# Patient Record
Sex: Female | Born: 1940
Health system: Southern US, Community
[De-identification: ages and names within clinical notes are randomized; demographics above are authoritative.]

## PROBLEM LIST (undated history)

## (undated) DIAGNOSIS — E041 Nontoxic single thyroid nodule: Secondary | ICD-10-CM

## (undated) DIAGNOSIS — K5792 Diverticulitis of intestine, part unspecified, without perforation or abscess without bleeding: Secondary | ICD-10-CM

## (undated) DIAGNOSIS — J45909 Unspecified asthma, uncomplicated: Secondary | ICD-10-CM

## (undated) DIAGNOSIS — E119 Type 2 diabetes mellitus without complications: Secondary | ICD-10-CM

## (undated) DIAGNOSIS — I10 Essential (primary) hypertension: Secondary | ICD-10-CM

## (undated) DIAGNOSIS — F32A Depression, unspecified: Secondary | ICD-10-CM

## (undated) DIAGNOSIS — J449 Chronic obstructive pulmonary disease, unspecified: Secondary | ICD-10-CM

## (undated) DIAGNOSIS — C801 Malignant (primary) neoplasm, unspecified: Secondary | ICD-10-CM

## (undated) DIAGNOSIS — F329 Major depressive disorder, single episode, unspecified: Secondary | ICD-10-CM

## (undated) DIAGNOSIS — K219 Gastro-esophageal reflux disease without esophagitis: Secondary | ICD-10-CM

## (undated) DIAGNOSIS — M1990 Unspecified osteoarthritis, unspecified site: Secondary | ICD-10-CM

## (undated) DIAGNOSIS — E785 Hyperlipidemia, unspecified: Secondary | ICD-10-CM

## (undated) HISTORY — DX: Essential (primary) hypertension: I10

## (undated) HISTORY — DX: Diverticulitis of intestine, part unspecified, without perforation or abscess without bleeding: K57.92

## (undated) HISTORY — DX: Chronic obstructive pulmonary disease, unspecified: J44.9

## (undated) HISTORY — PX: CATARACT EXTRACTION: SUR2

## (undated) HISTORY — PX: CATARACT EXTRACTION W/ INTRAOCULAR LENS IMPLANT: SHX1309

## (undated) HISTORY — DX: Type 2 diabetes mellitus without complications: E11.9

## (undated) HISTORY — PX: EYE SURGERY: SHX253

## (undated) HISTORY — PX: BIOPSY THYROID: PRO38

## (undated) HISTORY — DX: Unspecified asthma, uncomplicated: J45.909

## (undated) HISTORY — DX: Hyperlipidemia, unspecified: E78.5

## (undated) HISTORY — PX: TUBAL LIGATION: SHX77

---

## 1999-11-12 ENCOUNTER — Other Ambulatory Visit: Admission: RE | Admit: 1999-11-12 | Discharge: 1999-11-12 | Payer: Self-pay | Admitting: Gynecology

## 2000-06-03 ENCOUNTER — Other Ambulatory Visit: Admission: RE | Admit: 2000-06-03 | Discharge: 2000-06-03 | Payer: Self-pay | Admitting: Gynecology

## 2006-08-29 ENCOUNTER — Ambulatory Visit (HOSPITAL_COMMUNITY): Admission: RE | Admit: 2006-08-29 | Discharge: 2006-08-29 | Payer: Self-pay | Admitting: Internal Medicine

## 2006-12-30 ENCOUNTER — Encounter: Admission: RE | Admit: 2006-12-30 | Discharge: 2006-12-30 | Payer: Self-pay | Admitting: Internal Medicine

## 2007-12-18 ENCOUNTER — Encounter: Admission: RE | Admit: 2007-12-18 | Discharge: 2007-12-18 | Payer: Self-pay | Admitting: Internal Medicine

## 2008-03-02 ENCOUNTER — Emergency Department (HOSPITAL_COMMUNITY): Admission: EM | Admit: 2008-03-02 | Discharge: 2008-03-03 | Payer: Self-pay | Admitting: Emergency Medicine

## 2009-05-19 ENCOUNTER — Encounter: Admission: RE | Admit: 2009-05-19 | Discharge: 2009-05-19 | Payer: Self-pay | Admitting: Internal Medicine

## 2009-05-27 ENCOUNTER — Encounter: Admission: RE | Admit: 2009-05-27 | Discharge: 2009-05-27 | Payer: Self-pay | Admitting: Internal Medicine

## 2009-10-04 ENCOUNTER — Ambulatory Visit (HOSPITAL_COMMUNITY): Admission: RE | Admit: 2009-10-04 | Discharge: 2009-10-04 | Payer: Self-pay | Admitting: Obstetrics and Gynecology

## 2009-11-21 ENCOUNTER — Ambulatory Visit (HOSPITAL_COMMUNITY): Admission: RE | Admit: 2009-11-21 | Discharge: 2009-11-21 | Payer: Self-pay | Admitting: Obstetrics and Gynecology

## 2010-06-11 ENCOUNTER — Encounter: Admission: RE | Admit: 2010-06-11 | Discharge: 2010-06-11 | Payer: Self-pay | Admitting: Internal Medicine

## 2010-11-23 LAB — BASIC METABOLIC PANEL
BUN: 8 mg/dL (ref 6–23)
CO2: 30 mEq/L (ref 19–32)
Calcium: 9.1 mg/dL (ref 8.4–10.5)
Calcium: 9.9 mg/dL (ref 8.4–10.5)
Creatinine, Ser: 0.85 mg/dL (ref 0.4–1.2)
Creatinine, Ser: 0.92 mg/dL (ref 0.4–1.2)
GFR calc Af Amer: >60 mL/min (ref >60)
GFR calc non Af Amer: >60 mL/min (ref >60)
GFR calc non Af Amer: >60 mL/min (ref >60)
Glucose, Bld: 112 mg/dL — ABNORMAL HIGH (ref 70–99)
Glucose, Bld: 90 mg/dL (ref 70–99)

## 2010-11-23 LAB — URINALYSIS, ROUTINE W REFLEX MICROSCOPIC
Leukocytes, UA: NEGATIVE
Nitrite: NEGATIVE
Protein, ur: NEGATIVE mg/dL
Urobilinogen, UA: 1 mg/dL (ref 0.0–1.0)

## 2010-11-23 LAB — URINE MICROSCOPIC-ADD ON

## 2010-11-23 LAB — CBC
MCHC: 33.2 g/dL (ref 30.0–36.0)
RDW: 13.6 % (ref 11.5–15.5)

## 2010-11-30 LAB — URINALYSIS, ROUTINE W REFLEX MICROSCOPIC
Ketones, ur: NEGATIVE mg/dL
Leukocytes, UA: NEGATIVE
Nitrite: NEGATIVE
pH: 5 (ref 5.0–8.0)

## 2010-11-30 LAB — CBC
MCV: 98.8 fL (ref 78.0–100.0)
Platelets: 210 10*3/uL (ref 150–400)
WBC: 7.2 10*3/uL (ref 4.0–10.5)

## 2010-11-30 LAB — URINE MICROSCOPIC-ADD ON

## 2010-11-30 LAB — BASIC METABOLIC PANEL
BUN: 15 mg/dL (ref 6–23)
Chloride: 102 mEq/L (ref 96–112)
Creatinine, Ser: 0.9 mg/dL (ref 0.4–1.2)
GFR calc non Af Amer: >60 mL/min (ref >60)

## 2011-06-03 LAB — POCT URINALYSIS DIP (DEVICE)
Bilirubin Urine: NEGATIVE
Glucose, UA: NEGATIVE
Nitrite: NEGATIVE
Operator id: 303351

## 2011-06-03 LAB — DIFFERENTIAL
Lymphocytes Relative: 24
Monocytes Absolute: 0.8
Monocytes Relative: 7
Neutro Abs: 8.7 — ABNORMAL HIGH

## 2011-06-03 LAB — CBC
Hemoglobin: 13.3
RBC: 4.18
WBC: 12.8 — ABNORMAL HIGH

## 2011-06-03 LAB — POCT I-STAT, CHEM 8
BUN: 12
Calcium, Ion: 1.16
Chloride: 100
Glucose, Bld: 154 — ABNORMAL HIGH
Potassium: 3.1 — ABNORMAL LOW

## 2011-06-03 LAB — URINE CULTURE

## 2012-12-11 ENCOUNTER — Other Ambulatory Visit: Payer: Self-pay | Admitting: Internal Medicine

## 2012-12-11 ENCOUNTER — Ambulatory Visit
Admission: RE | Admit: 2012-12-11 | Discharge: 2012-12-11 | Disposition: A | Discharge status: Home / Self Care | Payer: Medicare Other | Source: Ambulatory Visit | Attending: Internal Medicine | Admitting: Internal Medicine

## 2012-12-11 DIAGNOSIS — R0602 Shortness of breath: Secondary | ICD-10-CM

## 2012-12-11 DIAGNOSIS — J449 Chronic obstructive pulmonary disease, unspecified: Secondary | ICD-10-CM

## 2012-12-14 ENCOUNTER — Encounter: Payer: Self-pay | Admitting: Pulmonary Disease

## 2012-12-15 ENCOUNTER — Encounter: Payer: Self-pay | Admitting: Pulmonary Disease

## 2012-12-15 ENCOUNTER — Ambulatory Visit (INDEPENDENT_AMBULATORY_CARE_PROVIDER_SITE_OTHER): Payer: Medicare Other | Admitting: Pulmonary Disease

## 2012-12-15 VITALS — BP 130/72 | HR 75 | Temp 98.4°F | Ht 62.5 in | Wt 112.0 lb

## 2012-12-15 DIAGNOSIS — J449 Chronic obstructive pulmonary disease, unspecified: Secondary | ICD-10-CM | POA: Insufficient documentation

## 2012-12-15 NOTE — Progress Notes (Signed)
  Subjective:    Patient ID: [name], female    DOB: 1940-12-10, 72 y.o.   MRN: [id number]  HPI  PCP- Andi Devon  72 year old one pack per day smoker referred for evaluation of COPD. She started smoking as a teenager and smoked about 60 pack years. 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 She reports dyspnea on exertion including while performing usual activities around the house.. She reports early morning cough that gets better as the day goes by with minimal productive phlegm. She reports occasional wheeze especially during activity. She denies frequent chest colds or pedal edema. She denies exertional chest pain or significant allergies. She was given a prescription for Symbicort inhaler but she has not started this yet. She was given nebulizer treatment with albuterol at her primary physician's office and developed some shortness of breath and was unable to perform spirometry there. Today her spirometry surprisingly shows an FEV1 of 70%, the ratio 62 and FVC of 86% suggesting mild airway obstruction. However smaller airways are significantly decreased at 30% suggesting severe airway obstruction. Chest x-ray showed hyperinflation and no infiltrates or effusions Her voice has been deep for many years- she denies any recent changes to her voice or hoarseness  Past Medical History  Diagnosis Date  . Hyperlipidemia   . Asthma     Past Surgical History  Procedure Laterality Date  . Cesarean section    . Cataract extraction      left eye    No Known Allergies  History   Social History  . Marital Status: Widowed    Spouse Name: N/A    Number of Children: N/A  . Years of Education: N/A   Occupational History  . retired    Social History Main Topics  . Smoking status: [name] Every Day Smoker -- 0.50 packs/day for 54 years    Types: Cigarettes  . Smokeless tobacco: Not on file  . Alcohol Use: No  . Drug Use: No  . Sexually Active: Not  on file   Other Topics Concern  . Not on file   Social History Narrative  . No narrative on file    Family History  Problem Relation Age of Onset  . Renal cancer Brother   . Diabetes Mother   . Hypertension Mother   . Renal Disease Mother      Review of Systems  Constitutional: Positive for appetite change and unexpected weight change.  HENT: Positive for sore throat. Negative for ear pain, congestion, sneezing and dental problem.   Respiratory: Positive for cough and shortness of breath.   Cardiovascular: Negative for chest pain, palpitations and leg swelling.  Gastrointestinal: Negative for abdominal pain.  Musculoskeletal: Positive for joint swelling.  Skin: Negative for rash.  Neurological: Negative for headaches.  Psychiatric/Behavioral: Negative for dysphoric mood. The patient is not nervous/anxious.        Objective:   Physical Exam  Gen. Pleasant, Thin woman, in no distress, normal affect ENT - no lesions, no post nasal drip Neck: No JVD, no thyromegaly, no carotid bruits Lungs: no use of accessory muscles, no dullness to percussion, decreased without rales or rhonchi  Cardiovascular: Rhythm regular, heart sounds  normal, no murmurs or gallops, no peripheral edema Abdomen: soft and non-tender, no hepatosplenomegaly, BS normal. Musculoskeletal: No deformities, no cyanosis or clubbing Neuro:  alert, non focal       Assessment & Plan:

## 2012-12-15 NOTE — Assessment & Plan Note (Signed)
moderate COPD-FEV1 70% but smaller airways at 30% seem to more accurately reflect lung function You have to quit smoking OK to try symbicort 2 puffs twice daily - I would prefer long-acting anticholinergic such as Spiriva to improve her exercise tolerance You will benefit from pulmonary rehab It is unclear to me if she had a true paradoxical reaction to albuterol but at any rate we'll hold off albuterol for now

## 2012-12-15 NOTE — Patient Instructions (Addendum)
You have moderate COPD Lung function is decreased at 70% You have to quit smoking OK to try symbicort 2 puffs twice daily You will benefit from pulmonary rehab

## 2013-01-25 ENCOUNTER — Encounter: Payer: Self-pay | Admitting: Pulmonary Disease

## 2013-01-25 ENCOUNTER — Ambulatory Visit (INDEPENDENT_AMBULATORY_CARE_PROVIDER_SITE_OTHER): Payer: Medicare Other | Admitting: Pulmonary Disease

## 2013-01-25 VITALS — BP 122/62 | HR 66 | Temp 97.4°F | Ht 63.0 in | Wt 113.4 lb

## 2013-01-25 DIAGNOSIS — J449 Chronic obstructive pulmonary disease, unspecified: Secondary | ICD-10-CM

## 2013-01-25 MED ORDER — NICOTINE 10 MG IN INHA: 1.0000 | RESPIRATORY_TRACT | Status: DC | PRN

## 2013-01-25 MED ORDER — TIOTROPIUM BROMIDE MONOHYDRATE 18 MCG IN CAPS: 18.0000 ug | ORAL_CAPSULE | Freq: Every day | RESPIRATORY_TRACT | Status: DC

## 2013-01-25 NOTE — Progress Notes (Signed)
  Subjective:    Patient ID: [name], female    DOB: 05-23-1941, 72 y.o.   MRN: [id number]  HPI PCP- Andi Devon   72 year old one pack per day smoker referred for FU of COPD.  She started smoking as a teenager and smoked about 60 pack years. 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  She reports dyspnea on exertion including while performing usual activities around the house.. She reports early morning cough that gets better as the day goes by with minimal productive phlegm. She reports occasional wheeze especially during activity. She denies frequent chest colds or pedal edema. She denies exertional chest pain or significant allergies. She was given a prescription for Symbicort inhaler but she has not started this yet. She was given nebulizer treatment with albuterol at her primary physician's office and developed some shortness of breath and was unable to perform spirometry there.  Today her spirometry surprisingly shows an FEV1 of 70%, the ratio 62 and FVC of 86% suggesting mild airway obstruction. However smaller airways are significantly decreased at 30% suggesting severe airway obstruction.  Chest x-ray showed hyperinflation and no infiltrates or effusions  Her voice has been deep for many years- she denies any recent changes to her voice or hoarseness    01/25/2013  Pt states her breathing is slight owrse esp on climbing stairs Pt c/o cough in the AM (clear phlem when she can produce mucus), drainage at night, blows out clear phlem, ocassional wheezing but no chest tx. She continues to smoke 1/2 pPD & wants to discuss medications to help her quit - she does adit that she enjoys smoking   Review of Systems neg for any significant sore throat, dysphagia, itching, sneezing, nasal congestion or excess/ purulent secretions, fever, chills, sweats, unintended wt loss, pleuritic or exertional cp, hempoptysis, orthopnea pnd or change in chronic leg swelling.  Also denies presyncope, palpitations, heartburn, abdominal pain, nausea, vomiting, diarrhea or change in bowel or urinary habits, dysuria,hematuria, rash, arthralgias, visual complaints, headache, numbness weakness or ataxia.     Objective:   Physical Exam  Gen. Pleasant, thin woman, in no distress ENT - no lesions, no post nasal drip Neck: No JVD, no thyromegaly, no carotid bruits Lungs: no use of accessory muscles, no dullness to percussion, decreased without rales or rhonchi  Cardiovascular: Rhythm regular, heart sounds  normal, no murmurs or gallops, no peripheral edema Musculoskeletal: No deformities, no cyanosis or clubbing , no tremors       Assessment & Plan:

## 2013-01-25 NOTE — Patient Instructions (Addendum)
Trial of nicotine patch 14 mg / day Rx for nicotine inhaler x 5 will be sent Call us in 1 month to report If needed, we will send Rx for chantix STOP symbicort Start taking spiriva once daily- call us for Rx if this helps

## 2013-01-25 NOTE — Assessment & Plan Note (Signed)
Trial of nicotine patch 14 mg / day Rx for nicotine inhaler x 5 will be sent Call us in 1 month to report If needed, we will send Rx for chantix STOP symbicort Start taking spiriva once daily- call us for Rx if this helps 

## 2013-02-05 ENCOUNTER — Telehealth: Payer: Self-pay | Admitting: Pulmonary Disease

## 2013-02-05 MED ORDER — TIOTROPIUM BROMIDE MONOHYDRATE 18 MCG IN CAPS: 18.0000 ug | ORAL_CAPSULE | Freq: Every day | RESPIRATORY_TRACT | Status: DC

## 2013-02-05 NOTE — Telephone Encounter (Signed)
I spoke with pt and is aware rx sent. Nothing further was needed

## 2013-04-03 ENCOUNTER — Telehealth: Payer: Self-pay | Admitting: *Deleted

## 2013-04-03 ENCOUNTER — Telehealth: Payer: Self-pay | Admitting: Pulmonary Disease

## 2013-04-03 NOTE — Telephone Encounter (Signed)
LMTCB x1 for pt to get this scheduled

## 2013-04-03 NOTE — Telephone Encounter (Signed)
Accidentally called and LMTCB x1 for pt. If she calls back will let her know it was a mistake.  Received form from pulm rehab pt needs post BD but she had this done 12/15/12 and this was marked on form. I called to let phyllis know and had to leave a message.

## 2013-04-04 NOTE — Telephone Encounter (Signed)
lmomtcb on pt's home and cell #s to schedule Post BD spiro for pulm rehab

## 2013-04-04 NOTE — Telephone Encounter (Signed)
Pt returned call; advised of the need to schedule Post BD Cled[facility] Daub for pulm rehab and that RA's nurse will need to schedule this.  Pt actually stated that she is currently in [facility] and will not return to [place] until late next week.  Pt stated she will call the office once she returns to have this scheduled.  I have discussed this with Mindy. Will sign and forward to RA as FYI.

## 2013-04-27 ENCOUNTER — Ambulatory Visit (INDEPENDENT_AMBULATORY_CARE_PROVIDER_SITE_OTHER): Payer: Medicare Other | Admitting: Adult Health

## 2013-04-27 ENCOUNTER — Encounter: Payer: Self-pay | Admitting: Adult Health

## 2013-04-27 VITALS — BP 112/70 | HR 70 | Temp 98.2°F | Ht 63.0 in | Wt 113.2 lb

## 2013-04-27 DIAGNOSIS — J449 Chronic obstructive pulmonary disease, unspecified: Secondary | ICD-10-CM

## 2013-04-27 NOTE — Progress Notes (Signed)
  Subjective:    Patient ID: [name], female    DOB: [birth year], 72 y.o.   MRN: [id number]  HPI  PCP- Andi Devon   72 year old one pack per day smoker referred for FU of COPD.  She started smoking as a teenager and smoked about 60 pack years. 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  She reports dyspnea on exertion including while performing usual activities around the house.. She reports early morning cough that gets better as the day goes by with minimal productive phlegm. She reports occasional wheeze especially during activity. She denies frequent chest colds or pedal edema. She denies exertional chest pain or significant allergies. She was given a prescription for Symbicort inhaler but she has not started this yet. She was given nebulizer treatment with albuterol at her primary physician's office and developed some shortness of breath and was unable to perform spirometry there.  Today her spirometry surprisingly shows an FEV1 of 70%, the ratio 62 and FVC of 86% suggesting mild airway obstruction. However smaller airways are significantly decreased at 30% suggesting severe airway obstruction.  Chest x-ray showed hyperinflation and no infiltrates or effusions  Her voice has been deep for many years- she denies any recent changes to her voice or hoarseness    01/25/13  Pt states her breathing is slight owrse esp on climbing stairs Pt c/o cough in the AM (clear phlem when she can produce mucus), drainage at night, blows out clear phlem, ocassional wheezing but no chest tx. She continues to smoke 1/2 pPD & wants to discuss medications to help her quit - she does adit that she enjoys smoking >>spiriva rx   04/27/2013 Follow up COPD  Patient returns for a three-month followup for COPD, last visit. She was given a prescription for Spiriva. Patient reports that she is feeling better . Unsure it helped her dyspnea. Main issue is DOE -but not all the time Does  house cleaning without difficulty, does own shopping.  Unfortunately, patient is still smoking she was advised on smoking cessation. CXR was nml 12/2012  CAT score >9  Pneumovax utd 2013    Review of Systems  neg for any significant sore throat, dysphagia, itching, sneezing, nasal congestion or excess/ purulent secretions, fever, chills, sweats, unintended wt loss, pleuritic or exertional cp, hempoptysis, orthopnea pnd or change in chronic leg swelling. Also denies presyncope, palpitations, heartburn, abdominal pain, nausea, vomiting, diarrhea or change in bowel or urinary habits, dysuria,hematuria, rash, arthralgias, visual complaints, headache, numbness weakness or ataxia.     Objective:   Physical Exam   Gen. Pleasant, thin woman, in no distress ENT - no lesions, no post nasal drip Neck: No JVD, no thyromegaly, no carotid bruits Lungs: no use of accessory muscles, no dullness to percussion, decreased without rales or rhonchi  Cardiovascular: Rhythm regular, heart sounds  normal, no murmurs or gallops, no peripheral edema Musculoskeletal: No deformities, no cyanosis or clubbing , no tremors       Assessment & Plan:

## 2013-04-27 NOTE — Assessment & Plan Note (Signed)
Compensated on present regimen.  Plan MOST IMPORTANT GOAL IS TO QUIT SMOKING Continue on Spiriva daily. Follow up Dr. [name] in 4 months and as needed.

## 2013-04-27 NOTE — Patient Instructions (Addendum)
MOST IMPORTANT GOAL IS TO QUIT SMOKING Continue on Spiriva daily. Follow up Dr. [name] in 4 months and as needed.

## 2013-05-30 ENCOUNTER — Other Ambulatory Visit: Payer: Self-pay | Admitting: Pulmonary Disease

## 2013-09-30 ENCOUNTER — Other Ambulatory Visit: Payer: Self-pay | Admitting: Pulmonary Disease

## 2013-10-09 ENCOUNTER — Ambulatory Visit: Payer: Medicare Other | Admitting: Pulmonary Disease

## 2013-11-08 ENCOUNTER — Encounter: Payer: Self-pay | Admitting: Advanced Practice Midwife

## 2013-11-22 ENCOUNTER — Ambulatory Visit (INDEPENDENT_AMBULATORY_CARE_PROVIDER_SITE_OTHER): Payer: Medicare Other | Admitting: Pulmonary Disease

## 2013-11-22 ENCOUNTER — Encounter: Payer: Self-pay | Admitting: Pulmonary Disease

## 2013-11-22 VITALS — BP 140/78 | HR 60 | Temp 97.6°F | Ht 63.0 in | Wt 121.2 lb

## 2013-11-22 DIAGNOSIS — J309 Allergic rhinitis, unspecified: Secondary | ICD-10-CM | POA: Insufficient documentation

## 2013-11-22 DIAGNOSIS — J449 Chronic obstructive pulmonary disease, unspecified: Secondary | ICD-10-CM | POA: Diagnosis not present

## 2013-11-22 NOTE — Progress Notes (Signed)
   Subjective:    Patient ID: [name], female    DOB: [birth year], 73 y.o.   MRN: [id number]  HPI  PCP- Willey Blade   73 year old one pack per day smoker  for FU of gold B COPD.  She started smoking as a teenager and smoked about 60 pack years. She worked in a [employer] for 20 years until she retired in 9.   spirometry 12/2012 FEV1 of 70%, the ratio 62 and FVC of 86% suggesting mild airway obstruction. However smaller airways are significantly decreased at 30% suggesting severe airway obstruction.  Chest x-ray showed hyperinflation and no infiltrates or effusions  Her voice has been deep for many years- she denies any recent changes to her voice or hoarseness  Did not tolerate symbicort - started on spiriva 01/2013   11/22/2013 80m FU  Chief Complaint  Patient presents with  . Follow-up    sob-same,occass. wheezing,cough in am dry then changes to thick and white,denies cp or tightness, no fcs   Unsure if she is getting spiriva from capsules Smokes 1/2 PPD Stays inside-winter, gets busy once warmer C/o nasal drainage all year round  Past Medical History  Diagnosis Date  . Hyperlipidemia   . Asthma      Review of Systems neg for any significant sore throat, dysphagia, itching, sneezing, nasal congestion or excess/ purulent secretions, fever, chills, sweats, unintended wt loss, pleuritic or exertional cp, hempoptysis, orthopnea pnd or change in chronic leg swelling. Also denies presyncope, palpitations, heartburn, abdominal pain, nausea, vomiting, diarrhea or change in bowel or urinary habits, dysuria,hematuria, rash, arthralgias, visual complaints, headache, numbness weakness or ataxia.     Objective:   Physical Exam  Gen. Pleasant,thin woman, in no distress, normal affect ENT - no lesions, no post nasal drip Neck: No JVD, no thyromegaly, no carotid bruits Lungs: no use of accessory muscles, no dullness to percussion, clear without rales or rhonchi   Cardiovascular: Rhythm regular, heart sounds  normal, no murmurs or gallops, no peripheral edema Abdomen: soft and non-tender, no hepatosplenomegaly, BS normal. Musculoskeletal: No deformities, no cyanosis or clubbing Neuro:  alert, non focal       Assessment & Plan:

## 2013-11-22 NOTE — Assessment & Plan Note (Signed)
If nasal steroid does not work, trial atrovent

## 2013-11-22 NOTE — Assessment & Plan Note (Addendum)
Trial of spiriva respimat Trial of nasal steroid We discussed CT screening for lung cancer -extensive discussion on risks & benefits Nicotrol inhaler x 4 - call us for more if this works - You have to make another QUIT attempt We discussed COPD study

## 2013-11-22 NOTE — Patient Instructions (Addendum)
Trial of spiriva respimat Trial of nasal steroid We discussed CT screening for lung cancer Nicotrol inhaler x 4 - call us for more if this works - You have to make another QUIT attempt We discussed COPD study

## 2013-11-28 ENCOUNTER — Other Ambulatory Visit: Payer: Self-pay | Admitting: Pulmonary Disease

## 2013-11-28 ENCOUNTER — Ambulatory Visit (INDEPENDENT_AMBULATORY_CARE_PROVIDER_SITE_OTHER)
Admission: RE | Admit: 2013-11-28 | Discharge: 2013-11-28 | Disposition: A | Discharge status: Home / Self Care | Payer: Medicare Other | Source: Ambulatory Visit | Attending: Pulmonary Disease | Admitting: Pulmonary Disease

## 2013-11-28 DIAGNOSIS — J449 Chronic obstructive pulmonary disease, unspecified: Secondary | ICD-10-CM

## 2013-11-30 ENCOUNTER — Ambulatory Visit (INDEPENDENT_AMBULATORY_CARE_PROVIDER_SITE_OTHER): Payer: Medicare Other | Admitting: Obstetrics & Gynecology

## 2013-11-30 ENCOUNTER — Encounter: Payer: Self-pay | Admitting: Advanced Practice Midwife

## 2013-11-30 VITALS — BP 162/87 | HR 118 | Temp 98.7°F | Ht 63.0 in | Wt 118.0 lb

## 2013-11-30 DIAGNOSIS — N898 Other specified noninflammatory disorders of vagina: Secondary | ICD-10-CM

## 2013-11-30 LAB — POCT WET PREP (WET MOUNT)
CLUE CELLS WET PREP WHIFF POC: NEGATIVE
KOH WET PREP POC: NEGATIVE

## 2013-11-30 MED ORDER — CLINDAMYCIN HCL 300 MG PO CAPS: 300.0000 mg | ORAL_CAPSULE | Freq: Two times a day (BID) | ORAL | Status: DC

## 2013-11-30 NOTE — Progress Notes (Signed)
Subjective:     [name] is a 73 y.o. female here for a routine exam.  Current complaints: Patient states she had a pap in September of last year. Patient states she was told she had a STD, which she does not understand. She was treated for a UTI 2 months ago. Patient c/o of discharge that is yellow in color- maybe from "touching herself".  Personal health questionnaire reviewed: not asked.   Gynecologic History No LMP recorded. Patient is postmenopausal. Contraception: post menopausal status Last Pap: 2014. Results were: normal Last mammogram: due. Results were: normal in the past  Obstetric History OB History  No data available     The following portions of the patient's history were reviewed and updated as appropriate: allergies, current medications, past family history, past medical history, past social history, past surgical history and problem list.  Review of Systems Pertinent items are noted in HPI.    Objective:     SPEC: thin, purulent discharge     Assessment:   Vaginitis--likely desquamative;  ddx diagnosis--BV, atrophy  Plan:    Review Medical Diagnostic testing Clindamycin

## 2013-11-30 NOTE — Patient Instructions (Signed)
Vaginitis Vaginitis is an inflammation of the vagina. It is most often caused by a change in the normal balance of the bacteria and yeast that live in the vagina. This change in balance causes an overgrowth of certain bacteria or yeast, which causes the inflammation. There are different types of vaginitis, but the most common types are:  Bacterial vaginosis.  Yeast infection (candidiasis).  Trichomoniasis vaginitis. This is a sexually transmitted infection (STI).  Viral vaginitis.  Atropic vaginitis.  Allergic vaginitis. CAUSES  The cause depends on the type of vaginitis. Vaginitis can be caused by:  Bacteria (bacterial vaginosis).  Yeast (yeast infection).  A parasite (trichomoniasis vaginitis)  A virus (viral vaginitis).  Low hormone levels (atrophic vaginitis). Low hormone levels can occur during pregnancy, breastfeeding, or after menopause.  Irritants, such as bubble baths, scented tampons, and feminine sprays (allergic vaginitis). Other factors can change the normal balance of the yeast and bacteria that live in the vagina. These include:  Antibiotic medicines.  Poor hygiene.  Diaphragms, vaginal sponges, spermicides, birth control pills, and intrauterine devices (IUD).  Sexual intercourse.  Infection.  Uncontrolled diabetes.  A weakened immune system. SYMPTOMS  Symptoms can vary depending on the cause of the vaginitis. Common symptoms include:  Abnormal vaginal discharge.  The discharge is white, gray, or yellow with bacterial vaginosis.  The discharge is thick, white, and cheesy with a yeast infection.  The discharge is frothy and yellow or greenish with trichomoniasis.  A bad vaginal odor.  The odor is fishy with bacterial vaginosis.  Vaginal itching, pain, or swelling.  Painful intercourse.  Pain or burning when urinating. Sometimes, there are no symptoms. TREATMENT  Treatment will vary depending on the type of infection.   Bacterial  vaginosis and trichomoniasis are often treated with antibiotic creams or pills.  Yeast infections are often treated with antifungal medicines, such as vaginal creams or suppositories.  Viral vaginitis has no cure, but symptoms can be treated with medicines that relieve discomfort. Your sexual partner should be treated as well.  Atrophic vaginitis may be treated with an estrogen cream, pill, suppository, or vaginal ring. If vaginal dryness occurs, lubricants and moisturizing creams may help. You may be told to avoid scented soaps, sprays, or douches.  Allergic vaginitis treatment involves quitting the use of the product that is causing the problem. Vaginal creams can be used to treat the symptoms. HOME CARE INSTRUCTIONS   Take all medicines as directed by your caregiver.  Keep your genital area clean and dry. Avoid soap and only rinse the area with water.  Avoid douching. It can remove the healthy bacteria in the vagina.  Do not use tampons or have sexual intercourse until your vaginitis has been treated. Use sanitary pads while you have vaginitis.  Wipe from front to back. This avoids the spread of bacteria from the rectum to the vagina.  Let air reach your genital area.  Wear cotton underwear to decrease moisture buildup.  Avoid wearing underwear while you sleep until your vaginitis is gone.  Avoid tight pants and underwear or nylons without a cotton panel.  Take off wet clothing (especially bathing suits) as soon as possible.  Use mild, non-scented products. Avoid using irritants, such as:  Scented feminine sprays.  Fabric softeners.  Scented detergents.  Scented tampons.  Scented soaps or bubble baths.  Practice safe sex and use condoms. Condoms may prevent the spread of trichomoniasis and viral vaginitis. SEEK MEDICAL CARE IF:   You have abdominal pain.  You   have a fever or persistent symptoms for more than 2 3 days.  You have a fever and your symptoms suddenly  get worse. Document Released: 06/20/2007 Document Revised: 05/17/2012 Document Reviewed: 02/03/2012 [employer] Patient Information 2014 [employer].  

## 2013-12-03 ENCOUNTER — Encounter: Payer: Self-pay | Admitting: Obstetrics & Gynecology

## 2013-12-06 ENCOUNTER — Encounter: Payer: Self-pay | Admitting: Obstetrics & Gynecology

## 2013-12-13 ENCOUNTER — Encounter: Payer: Self-pay | Admitting: Obstetrics & Gynecology

## 2013-12-19 ENCOUNTER — Telehealth: Payer: Self-pay | Admitting: Pulmonary Disease

## 2013-12-19 MED ORDER — TIOTROPIUM BROMIDE MONOHYDRATE 2.5 MCG/ACT IN AERS: 2.0000 | INHALATION_SPRAY | Freq: Every day | RESPIRATORY_TRACT | Status: DC

## 2013-12-19 NOTE — Telephone Encounter (Signed)
Pt aware RX has been sent. Nothing further needed 

## 2014-03-01 ENCOUNTER — Other Ambulatory Visit: Payer: Medicare Other

## 2014-03-05 ENCOUNTER — Ambulatory Visit (INDEPENDENT_AMBULATORY_CARE_PROVIDER_SITE_OTHER)
Admission: RE | Admit: 2014-03-05 | Discharge: 2014-03-05 | Disposition: A | Discharge status: Home / Self Care | Payer: Medicare Other | Source: Ambulatory Visit | Attending: Pulmonary Disease | Admitting: Pulmonary Disease

## 2014-03-05 DIAGNOSIS — J449 Chronic obstructive pulmonary disease, unspecified: Secondary | ICD-10-CM

## 2014-04-14 ENCOUNTER — Other Ambulatory Visit: Payer: Self-pay | Admitting: Pulmonary Disease

## 2014-05-22 ENCOUNTER — Encounter (HOSPITAL_BASED_OUTPATIENT_CLINIC_OR_DEPARTMENT_OTHER): Payer: Self-pay | Admitting: Emergency Medicine

## 2014-05-22 ENCOUNTER — Inpatient Hospital Stay (HOSPITAL_BASED_OUTPATIENT_CLINIC_OR_DEPARTMENT_OTHER)
Admission: EM | Admit: 2014-05-22 | Discharge: 2014-05-26 | DRG: 191 | Disposition: A | Discharge status: Home / Self Care | Payer: Medicare HMO | Attending: Internal Medicine | Admitting: Internal Medicine

## 2014-05-22 ENCOUNTER — Emergency Department (HOSPITAL_BASED_OUTPATIENT_CLINIC_OR_DEPARTMENT_OTHER): Payer: Medicare HMO

## 2014-05-22 DIAGNOSIS — R64 Cachexia: Secondary | ICD-10-CM | POA: Diagnosis present

## 2014-05-22 DIAGNOSIS — IMO0002 Reserved for concepts with insufficient information to code with codable children: Secondary | ICD-10-CM

## 2014-05-22 DIAGNOSIS — F17200 Nicotine dependence, unspecified, uncomplicated: Secondary | ICD-10-CM | POA: Diagnosis present

## 2014-05-22 DIAGNOSIS — K219 Gastro-esophageal reflux disease without esophagitis: Secondary | ICD-10-CM | POA: Diagnosis present

## 2014-05-22 DIAGNOSIS — J441 Chronic obstructive pulmonary disease with (acute) exacerbation: Secondary | ICD-10-CM | POA: Diagnosis not present

## 2014-05-22 DIAGNOSIS — J40 Bronchitis, not specified as acute or chronic: Secondary | ICD-10-CM | POA: Diagnosis present

## 2014-05-22 DIAGNOSIS — E785 Hyperlipidemia, unspecified: Secondary | ICD-10-CM | POA: Diagnosis present

## 2014-05-22 DIAGNOSIS — J45901 Unspecified asthma with (acute) exacerbation: Principal | ICD-10-CM

## 2014-05-22 DIAGNOSIS — E782 Mixed hyperlipidemia: Secondary | ICD-10-CM | POA: Diagnosis present

## 2014-05-22 DIAGNOSIS — E041 Nontoxic single thyroid nodule: Secondary | ICD-10-CM | POA: Diagnosis present

## 2014-05-22 DIAGNOSIS — E440 Moderate protein-calorie malnutrition: Secondary | ICD-10-CM

## 2014-05-22 DIAGNOSIS — R0600 Dyspnea, unspecified: Secondary | ICD-10-CM | POA: Diagnosis present

## 2014-05-22 HISTORY — DX: Gastro-esophageal reflux disease without esophagitis: K21.9

## 2014-05-22 LAB — URINALYSIS, ROUTINE W REFLEX MICROSCOPIC
BILIRUBIN URINE: NEGATIVE
Glucose, UA: NEGATIVE mg/dL
Ketones, ur: NEGATIVE mg/dL
NITRITE: NEGATIVE
PH: 5.5 (ref 5.0–8.0)
Protein, ur: NEGATIVE mg/dL
SPECIFIC GRAVITY, URINE: 1.015 (ref 1.005–1.030)
Urobilinogen, UA: 1 mg/dL (ref 0.0–1.0)

## 2014-05-22 LAB — CBC WITH DIFFERENTIAL/PLATELET
Basophils Absolute: 0 10*3/uL (ref 0.0–0.1)
Basophils Relative: 0 % (ref 0–1)
Eosinophils Absolute: 0 10*3/uL (ref 0.0–0.7)
Eosinophils Relative: 0 % (ref 0–5)
HCT: 44.1 % (ref 36.0–46.0)
Hemoglobin: 15.1 g/dL — ABNORMAL HIGH (ref 12.0–15.0)
LYMPHS ABS: 1.1 10*3/uL (ref 0.7–4.0)
LYMPHS PCT: 15 % (ref 12–46)
MCH: 32.7 pg (ref 26.0–34.0)
MCHC: 34.2 g/dL (ref 30.0–36.0)
MCV: 95.5 fL (ref 78.0–100.0)
Monocytes Absolute: 0.8 10*3/uL (ref 0.1–1.0)
Monocytes Relative: 10 % (ref 3–12)
NEUTROS ABS: 5.7 10*3/uL (ref 1.7–7.7)
NEUTROS PCT: 75 % (ref 43–77)
Platelets: 174 10*3/uL (ref 150–400)
RBC: 4.62 MIL/uL (ref 3.87–5.11)
RDW: 14.9 % (ref 11.5–15.5)
WBC: 7.6 10*3/uL (ref 4.0–10.5)

## 2014-05-22 LAB — URINE MICROSCOPIC-ADD ON

## 2014-05-22 LAB — COMPREHENSIVE METABOLIC PANEL
ALT: 20 U/L (ref 0–35)
AST: 36 U/L (ref 0–37)
Albumin: 4.3 g/dL (ref 3.5–5.2)
Alkaline Phosphatase: 79 U/L (ref 39–117)
Anion gap: 14 (ref 5–15)
BUN: 11 mg/dL (ref 6–23)
CO2: 28 meq/L (ref 19–32)
Calcium: 10 mg/dL (ref 8.4–10.5)
Chloride: 98 mEq/L (ref 96–112)
Creatinine, Ser: 1 mg/dL (ref 0.50–1.10)
GFR calc Af Amer: 63 mL/min — ABNORMAL LOW (ref >90)
GFR, EST NON AFRICAN AMERICAN: 55 mL/min — AB (ref >90)
GLUCOSE: 121 mg/dL — AB (ref 70–99)
POTASSIUM: 3.8 meq/L (ref 3.7–5.3)
SODIUM: 140 meq/L (ref 137–147)
TOTAL PROTEIN: 8.5 g/dL — AB (ref 6.0–8.3)
Total Bilirubin: 1 mg/dL (ref 0.3–1.2)

## 2014-05-22 LAB — I-STAT CG4 LACTIC ACID, ED: LACTIC ACID, VENOUS: 1.74 mmol/L (ref 0.5–2.2)

## 2014-05-22 LAB — TROPONIN I: Troponin I: <0.3 ng/mL (ref <0.30)

## 2014-05-22 LAB — RAPID STREP SCREEN (MED CTR MEBANE ONLY): Streptococcus, Group A Screen (Direct): NEGATIVE

## 2014-05-22 MED ORDER — SODIUM CHLORIDE 0.9 % IV BOLUS (SEPSIS)
1000.0000 mL | Freq: Once | INTRAVENOUS | Status: AC
  Administered 2014-05-22: 1000 mL via INTRAVENOUS

## 2014-05-22 MED ORDER — SODIUM CHLORIDE 0.9 % IV SOLN: INTRAVENOUS | Status: AC

## 2014-05-22 MED ORDER — IPRATROPIUM BROMIDE 0.02 % IN SOLN
0.5000 mg | Freq: Once | RESPIRATORY_TRACT | Status: AC
  Administered 2014-05-22: 0.5 mg via RESPIRATORY_TRACT
  Filled 2014-05-22: qty 2.5

## 2014-05-22 MED ORDER — METHYLPREDNISOLONE SODIUM SUCC 125 MG IJ SOLR
125.0000 mg | Freq: Once | INTRAMUSCULAR | Status: AC
  Administered 2014-05-22: 125 mg via INTRAVENOUS
  Filled 2014-05-22: qty 2

## 2014-05-22 MED ORDER — LEVOFLOXACIN IN D5W 750 MG/150ML IV SOLN
750.0000 mg | Freq: Once | INTRAVENOUS | Status: AC
  Administered 2014-05-22: 750 mg via INTRAVENOUS
  Filled 2014-05-22: qty 150

## 2014-05-22 MED ORDER — IPRATROPIUM-ALBUTEROL 0.5-2.5 (3) MG/3ML IN SOLN: 3.0000 mL | RESPIRATORY_TRACT | Status: DC

## 2014-05-22 MED ORDER — LEVALBUTEROL HCL 1.25 MG/0.5ML IN NEBU
1.2500 mg | INHALATION_SOLUTION | Freq: Once | RESPIRATORY_TRACT | Status: AC
  Administered 2014-05-22: 1.25 mg via RESPIRATORY_TRACT
  Filled 2014-05-22: qty 0.5

## 2014-05-22 MED ORDER — ACETAMINOPHEN 325 MG PO TABS
650.0000 mg | ORAL_TABLET | Freq: Four times a day (QID) | ORAL | Status: DC | PRN
  Administered 2014-05-22 – 2014-05-24 (×3): 650 mg via ORAL
  Filled 2014-05-22 (×3): qty 2

## 2014-05-22 NOTE — ED Notes (Signed)
EDP at patient bedside for assessment/update  

## 2014-05-22 NOTE — ED Notes (Signed)
Pt and family updated on plan of care  

## 2014-05-22 NOTE — Plan of Care (Signed)
73 yo F with fever and URI symptoms for 2 days.  Respiratory source of fever.  CXR negative, probably bronchitis.  Being admitted for COPD exacerbation.

## 2014-05-22 NOTE — ED Provider Notes (Signed)
CSN: [id number]     Arrival date & time 05/22/14  2050 History  This chart was scribed for Ezequiel Essex, MD by Einar Pheasant, ED Scribe. This patient was seen in room MH07/MH07 and the patient's care was started at 9:26 PM.    Chief Complaint  Patient presents with  . Dizziness  . Cough   The history is provided by the patient. No language interpreter was used.   HPI Comments: [name] is a 73 y.o. female with hx of COPD and hyperlipidemia, presents to the Emergency Department complaining of light headedness that started today PTA. Pt is also complaining of a "sinus" headache, sore throat, and a cough that started 2 days ago. She endorses a fever at home that started yesterday. Current ED temperature is 103.2. She states that the light headedness has resolved. Denies any rhinorrhea, SOB, chest pain, abdominal pain, nausea, emesis, taking steroids by mouth, or hx of heart failure.  PCP: Roe Coombs, MD  Past Medical History  Diagnosis Date  . Hyperlipidemia   . Asthma    Past Surgical History  Procedure Laterality Date  . Cesarean section    . Cataract extraction      left eye   Family History  Problem Relation Age of Onset  . Renal cancer Brother   . Diabetes Mother   . Hypertension Mother   . Renal Disease Mother    History  Substance Use Topics  . Smoking status: Current Every Day Smoker -- 0.50 packs/day for 54 years    Types: Cigarettes  . Smokeless tobacco: Not on file  . Alcohol Use: No   OB History   Grav Para Term Preterm Abortions TAB SAB Ect Mult Living                 Review of Systems A complete 10 system review of systems was obtained and all systems are negative except as noted in the HPI and PMH.    Allergies  Review of patient's allergies indicates no known allergies.  Home Medications   Prior to Admission medications   Medication Sig Start Date End Date Taking? Authorizing Provider  temazepam (RESTORIL) 15 MG capsule Take 15 mg  by mouth at bedtime as needed for sleep.   Yes Historical Provider, MD  aspirin 81 MG tablet Take 81 mg by mouth daily.    Historical Provider, MD  clindamycin (CLEOCIN) 300 MG capsule Take 1 capsule (300 mg total) by mouth 2 (two) times daily. For 7 days 11/30/13   Lahoma Crocker, MD  ezetimibe (ZETIA) 10 MG tablet Take 10 mg by mouth daily.    Historical Provider, MD  niacin (NIASPAN) 500 MG CR tablet Take 500 mg by mouth daily.     Historical Provider, MD  SPIRIVA HANDIHALER 18 MCG inhalation capsule PLACE 1 CAPSULE (18 MCG TOTAL) INTO INHALER AND INHALE DAILY. 09/30/13   Rigoberto Noel, MD  SPIRIVA RESPIMAT 2.5 MCG/ACT AERS INHALE 2 PUFFS INTO THE LUNGS DAILY.    Rigoberto Noel, MD  zolpidem (AMBIEN) 10 MG tablet Take 10 mg by mouth at bedtime as needed for sleep.    Historical Provider, MD   Triage vitals:BP 162/66  Pulse 107  Temp(Src) 103.2 F (39.6 C) (Oral)  Resp 20  Ht 5\' 3"  (1.6 m)  Wt 113 lb (51.256 kg)  BMI 20.02 kg/m2  SpO2 98%  Physical Exam  Nursing note and vitals reviewed. Constitutional: She is oriented to person, place, and time. She appears  well-developed and well-nourished. No distress.  Appears nontoxic. No distress.  HENT:  Head: Normocephalic and atraumatic.  Mouth/Throat: Oropharynx is clear and moist. No oropharyngeal exudate.  Eyes: Conjunctivae and EOM are normal. Pupils are equal, round, and reactive to light.  Neck: Normal range of motion. Neck supple.  No meningismus.  Cardiovascular: Normal rate, regular rhythm, normal heart sounds and intact distal pulses.   No murmur heard. Pulmonary/Chest: Effort normal. No respiratory distress. She has no wheezes. She has no rales.  Decreased air movement throughout. tachypenea to 82s.  Abdominal: Soft. Bowel sounds are normal. She exhibits no distension. There is no tenderness. There is no rebound and no guarding.  Musculoskeletal: Normal range of motion. She exhibits no edema and no tenderness.  Neurological:  She is alert and oriented to person, place, and time. No cranial nerve deficit. She exhibits normal muscle tone. Coordination normal.  No ataxia on finger to nose bilaterally. No pronator drift. 5/5 strength throughout. CN 2-12 intact. Negative Romberg. Equal grip strength. Sensation intact. Gait is normal.   Skin: Skin is warm.  Psychiatric: She has a normal mood and affect. Her behavior is normal.    ED Course  Procedures (including critical care time)  DIAGNOSTIC STUDIES: Oxygen Saturation is 98% on RA, normal by my interpretation.    COORDINATION OF CARE: 9:33 PM- Will order CXR. Pt advised of plan for treatment and pt agrees.  Labs Review Labs Reviewed  CULTURE, BLOOD (ROUTINE X 2)  CULTURE, BLOOD (ROUTINE X 2)  URINE CULTURE  RAPID STREP SCREEN  CBC WITH DIFFERENTIAL  COMPREHENSIVE METABOLIC PANEL  URINALYSIS, ROUTINE W REFLEX MICROSCOPIC  TROPONIN I  I-STAT CG4 LACTIC ACID, ED    Imaging Review No results found.   EKG Interpretation   Date/Time:  Wednesday May 22 2014 21:58:12 EDT Ventricular Rate:  91 PR Interval:  156 QRS Duration: 80 QT Interval:  320 QTC Calculation: 393 R Axis:   71 Text Interpretation:  Normal sinus rhythm Biatrial enlargement Abnormal  ECG No significant change was found Confirmed by Wyvonnia Dusky  MD, Laasia Arcos  [phone]) on 05/22/2014 10:11:21 PM    me:  Wednesday May 22 2014 21:58:12 EDT Ventricular Rate:  91 PR Interval:  156 QRS Duration: 80 QT Interval:  320 QTC Calculation: 393 R Axis:   71 Text Interpretation:  Normal sinus rhythm Biatrial enlargement Abnormal  ECG No significant change was found Confirmed by Wyvonnia Dusky  MD, Raihana Balderrama  [phone]) on 05/22/2014 10:11:21 PM      MDM   Final diagnoses:  None   2 day history of generalized weakness, lightheadedness, fever and cough. No chest pain. No shortness of breath. Febrile and  tachycardic on arrival.  Patient states allergic to albuterol. She is not wheezing but has  decreased breath sounds throughout. We'll treat his COPD exacerbation with Xopenex and steroids.  Chest x-ray shows no pneumonia. Neurological exam is nonfocal. No meningismus. No headache.  Suspect viral bronchitis possibly influenza. Unable to test for influenza here. Patient remains tachypneic to the 30s. Decreased breath sounds throughout. We'll treat bronchitis with Levaquin.  Discussed with hospitalist Dr. Alcario Drought who accepts patient for observation at [facility].    I personally performed the services described in this documentation, which was scribed in my presence. The recorded information has been reviewed and is accurate.    Ezequiel Essex, MD 05/22/14 [phone]

## 2014-05-22 NOTE — ED Notes (Signed)
Pt ambulated in hallway. Saturations 92-95% on room air. Pt denied dizziness or increasing shortness of breath  during ambulation

## 2014-05-23 ENCOUNTER — Encounter (HOSPITAL_COMMUNITY): Payer: Self-pay | Admitting: General Practice

## 2014-05-23 DIAGNOSIS — E785 Hyperlipidemia, unspecified: Secondary | ICD-10-CM | POA: Diagnosis present

## 2014-05-23 DIAGNOSIS — J40 Bronchitis, not specified as acute or chronic: Secondary | ICD-10-CM | POA: Diagnosis present

## 2014-05-23 DIAGNOSIS — E440 Moderate protein-calorie malnutrition: Secondary | ICD-10-CM | POA: Diagnosis present

## 2014-05-23 DIAGNOSIS — R0600 Dyspnea, unspecified: Secondary | ICD-10-CM | POA: Diagnosis present

## 2014-05-23 DIAGNOSIS — J441 Chronic obstructive pulmonary disease with (acute) exacerbation: Secondary | ICD-10-CM | POA: Diagnosis present

## 2014-05-23 DIAGNOSIS — F17200 Nicotine dependence, unspecified, uncomplicated: Secondary | ICD-10-CM | POA: Diagnosis present

## 2014-05-23 DIAGNOSIS — E782 Mixed hyperlipidemia: Secondary | ICD-10-CM | POA: Diagnosis present

## 2014-05-23 DIAGNOSIS — R64 Cachexia: Secondary | ICD-10-CM | POA: Diagnosis present

## 2014-05-23 DIAGNOSIS — IMO0002 Reserved for concepts with insufficient information to code with codable children: Secondary | ICD-10-CM | POA: Diagnosis not present

## 2014-05-23 DIAGNOSIS — E041 Nontoxic single thyroid nodule: Secondary | ICD-10-CM | POA: Diagnosis present

## 2014-05-23 DIAGNOSIS — K219 Gastro-esophageal reflux disease without esophagitis: Secondary | ICD-10-CM | POA: Diagnosis present

## 2014-05-23 LAB — BASIC METABOLIC PANEL
ANION GAP: 13 (ref 5–15)
BUN: 12 mg/dL (ref 6–23)
CHLORIDE: 103 meq/L (ref 96–112)
CO2: 25 mEq/L (ref 19–32)
Calcium: 9.1 mg/dL (ref 8.4–10.5)
Creatinine, Ser: 0.84 mg/dL (ref 0.50–1.10)
GFR calc Af Amer: 78 mL/min — ABNORMAL LOW (ref >90)
GFR, EST NON AFRICAN AMERICAN: 67 mL/min — AB (ref >90)
Glucose, Bld: 163 mg/dL — ABNORMAL HIGH (ref 70–99)
Potassium: 4.1 mEq/L (ref 3.7–5.3)
SODIUM: 141 meq/L (ref 137–147)

## 2014-05-23 LAB — URINE CULTURE

## 2014-05-23 LAB — CBC
HCT: 41.6 % (ref 36.0–46.0)
HEMOGLOBIN: 14 g/dL (ref 12.0–15.0)
MCH: 32.2 pg (ref 26.0–34.0)
MCHC: 33.7 g/dL (ref 30.0–36.0)
MCV: 95.6 fL (ref 78.0–100.0)
Platelets: 174 10*3/uL (ref 150–400)
RBC: 4.35 MIL/uL (ref 3.87–5.11)
RDW: 14.9 % (ref 11.5–15.5)
WBC: 5.3 10*3/uL (ref 4.0–10.5)

## 2014-05-23 MED ORDER — ASPIRIN 81 MG PO CHEW
81.0000 mg | CHEWABLE_TABLET | Freq: Every day | ORAL | Status: DC
  Administered 2014-05-23 – 2014-05-26 (×4): 81 mg via ORAL
  Filled 2014-05-23 (×4): qty 1

## 2014-05-23 MED ORDER — SODIUM CHLORIDE 0.9 % IV SOLN: 250.0000 mL | INTRAVENOUS | Status: DC | PRN

## 2014-05-23 MED ORDER — IPRATROPIUM BROMIDE 0.02 % IN SOLN
0.5000 mg | Freq: Two times a day (BID) | RESPIRATORY_TRACT | Status: DC
  Administered 2014-05-23 – 2014-05-26 (×6): 0.5 mg via RESPIRATORY_TRACT
  Filled 2014-05-23 (×6): qty 2.5

## 2014-05-23 MED ORDER — TEMAZEPAM 15 MG PO CAPS: 15.0000 mg | ORAL_CAPSULE | Freq: Every evening | ORAL | Status: DC | PRN

## 2014-05-23 MED ORDER — NIACIN ER 500 MG PO CPCR
500.0000 mg | ORAL_CAPSULE | Freq: Every day | ORAL | Status: DC
  Administered 2014-05-23 – 2014-05-26 (×4): 500 mg via ORAL
  Filled 2014-05-23 (×4): qty 1

## 2014-05-23 MED ORDER — EZETIMIBE 10 MG PO TABS
10.0000 mg | ORAL_TABLET | Freq: Every day | ORAL | Status: DC
  Administered 2014-05-23 – 2014-05-26 (×4): 10 mg via ORAL
  Filled 2014-05-23 (×4): qty 1

## 2014-05-23 MED ORDER — HYDROMORPHONE HCL 1 MG/ML IJ SOLN: 0.5000 mg | INTRAMUSCULAR | Status: DC | PRN

## 2014-05-23 MED ORDER — PREDNISONE 50 MG PO TABS
60.0000 mg | ORAL_TABLET | Freq: Every day | ORAL | Status: AC
  Administered 2014-05-23: 60 mg via ORAL
  Filled 2014-05-23: qty 1

## 2014-05-23 MED ORDER — ENOXAPARIN SODIUM 40 MG/0.4ML ~~LOC~~ SOLN
40.0000 mg | SUBCUTANEOUS | Status: DC
  Administered 2014-05-23 – 2014-05-25 (×3): 40 mg via SUBCUTANEOUS
  Filled 2014-05-23 (×4): qty 0.4

## 2014-05-23 MED ORDER — OXYCODONE HCL 5 MG PO TABS: 5.0000 mg | ORAL_TABLET | ORAL | Status: DC | PRN

## 2014-05-23 MED ORDER — SODIUM CHLORIDE 0.9 % IJ SOLN
3.0000 mL | Freq: Two times a day (BID) | INTRAMUSCULAR | Status: DC
  Administered 2014-05-23 – 2014-05-24 (×4): 3 mL via INTRAVENOUS

## 2014-05-23 MED ORDER — ENOXAPARIN SODIUM 30 MG/0.3ML ~~LOC~~ SOLN
30.0000 mg | SUBCUTANEOUS | Status: DC
  Filled 2014-05-23: qty 0.3

## 2014-05-23 MED ORDER — ONDANSETRON HCL 4 MG/2ML IJ SOLN: 4.0000 mg | Freq: Four times a day (QID) | INTRAMUSCULAR | Status: DC | PRN

## 2014-05-23 MED ORDER — LEVOFLOXACIN IN D5W 500 MG/100ML IV SOLN
500.0000 mg | INTRAVENOUS | Status: DC
  Administered 2014-05-23: 500 mg via INTRAVENOUS
  Filled 2014-05-23 (×2): qty 100

## 2014-05-23 MED ORDER — SODIUM CHLORIDE 0.9 % IJ SOLN: 3.0000 mL | Freq: Two times a day (BID) | INTRAMUSCULAR | Status: DC

## 2014-05-23 MED ORDER — ONDANSETRON HCL 4 MG PO TABS: 4.0000 mg | ORAL_TABLET | Freq: Four times a day (QID) | ORAL | Status: DC | PRN

## 2014-05-23 MED ORDER — IPRATROPIUM BROMIDE 0.02 % IN SOLN
0.5000 mg | RESPIRATORY_TRACT | Status: DC
  Administered 2014-05-23 (×2): 0.5 mg via RESPIRATORY_TRACT
  Filled 2014-05-23 (×2): qty 2.5

## 2014-05-23 MED ORDER — SODIUM CHLORIDE 0.9 % IJ SOLN
3.0000 mL | INTRAMUSCULAR | Status: DC | PRN
  Administered 2014-05-24: 3 mL via INTRAVENOUS

## 2014-05-23 NOTE — H&P (Signed)
[facility] Admission History and Physical       [name] XTK:[id number] DOB: 07-Oct-1940 DOA: 05/22/2014  Referring physician: EDP PCP: Salena Saner., MD, Glendale Chard MD Specialists:   Chief Complaint:   HPI: [name] is a 73 y.o. female with a history of COPD/Asthma, and Hyperlipidemia who presented to the [facility] Ed with complaints of URI symptoms for the past 2 days with nasal congestion and cough with development of fevers and chills over the past  24 hours.  She reports having worsening SOB and chest tightness.    In the ED, she had a temperature of 103.2.  The chest x-ray was negative for pneumonia, and she was placed on empiric IV Levaquin for Bronchitis and administered Atrovent Nebulizer treatments and Solumedrol and referred for medical admission.      Review of Systems:  Constitutional: No Weight Loss, No Weight Gain, Night Sweats, Fevers, Chills,  Dizziness, +Lightheadedness, +Malaise, Fatigue, No Generalized Weakness HEENT: No Headaches, Difficulty Swallowing,Tooth/Dental Problems,Sore Throat,  No Sneezing, Rhinitis, Ear Ache, +Nasal Congestion, or Post Nasal Drip,  Cardio-vascular:  No Chest pain, Orthopnea, PND, Edema in Lower Extremities, Anasarca, Dizziness, Palpitations  Resp: +Dyspnea, No DOE, +Non-Productive Cough, No Hemoptysis, No Wheezing.    GI: No Heartburn, Indigestion, Abdominal Pain, Nausea, Vomiting, Diarrhea, Hematemesis, Hematochezia, Melena, Change in Bowel Habits,  Loss of Appetite  GU: No Dysuria, Change in Color of Urine, No Urgency or Frequency, No Flank pain.  Musculoskeletal: No Joint Pain or Swelling, No Decreased Range of Motion, No Back Pain.  Neurologic: No Syncope, No Seizures, Muscle Weakness, Paresthesia, Vision Disturbance or Loss, No Diplopia, No Vertigo, No Difficulty Walking,  Skin: No Rash or Lesions. Psych: No Change in Mood or Affect, No Depression or Anxiety, No Memory loss, No Confusion, or  Hallucinations   Past Medical History  Diagnosis Date  . Hyperlipidemia   . Asthma   . COPD (chronic obstructive pulmonary disease)       Past Surgical History  Procedure Laterality Date  . Cesarean section    . Cataract extraction      left eye       Prior to Admission medications   Medication Sig Start Date End Date Taking? Authorizing Provider  temazepam (RESTORIL) 15 MG capsule Take 15 mg by mouth at bedtime as needed for sleep.   Yes Historical Provider, MD  aspirin 81 MG tablet Take 81 mg by mouth daily.    Historical Provider, MD  clindamycin (CLEOCIN) 300 MG capsule Take 1 capsule (300 mg total) by mouth 2 (two) times daily. For 7 days 11/30/13   Lahoma Crocker, MD  ezetimibe (ZETIA) 10 MG tablet Take 10 mg by mouth daily.    Historical Provider, MD  niacin (NIASPAN) 500 MG CR tablet Take 500 mg by mouth daily.     Historical Provider, MD  SPIRIVA HANDIHALER 18 MCG inhalation capsule PLACE 1 CAPSULE (18 MCG TOTAL) INTO INHALER AND INHALE DAILY. 09/30/13   Rigoberto Noel, MD  SPIRIVA RESPIMAT 2.5 MCG/ACT AERS INHALE 2 PUFFS INTO THE LUNGS DAILY.    Rigoberto Noel, MD  zolpidem (AMBIEN) 10 MG tablet Take 10 mg by mouth at bedtime as needed for sleep.    Historical Provider, MD    Allergies  Allergen Reactions  . Albuterol Shortness Of Breath   Social History:  reports that she has been smoking Cigarettes.  She has a 27 pack-year smoking history. She does not have any smokeless tobacco history on file.  She reports that she does not drink alcohol or use illicit drugs.     Family History  Problem Relation Age of Onset  . Renal cancer Brother   . Diabetes Mother   . Hypertension Mother   . Renal Disease Mother       Physical Exam:  GEN:  Pleasant Elderly Cachectic 73 y.o.  African American female examined  and in no acute distress; cooperative with exam Filed Vitals:   05/22/14 2332 05/22/14 2343 05/23/14 0001 05/23/14 0052  BP:  92/60 108/71 107/60  Pulse:  95  100 98  Temp:  100.3 F (37.9 C)  99.3 F (37.4 C)  TempSrc:    Oral  Resp:  19 18 18   Height:    5\' 3"  (1.6 m)  Weight:    52.8 kg (116 lb 6.5 oz)  SpO2: 95% 94% 96% 95%   Blood pressure 107/60, pulse 98, temperature 99.3 F (37.4 C), temperature source Oral, resp. rate 18, height 5\' 3"  (1.6 m), weight 52.8 kg (116 lb 6.5 oz), SpO2 95.00%. PSYCH: She is alert and oriented x4; does not appear anxious does not appear depressed; affect is normal HEENT: Normocephalic and Atraumatic, Mucous membranes pink; PERRLA; EOM intact; Fundi:  Benign;  No scleral icterus, Nares: Patent, Oropharynx: Clear, Edentulous with Dentures Present  , Neck:  FROM, No Cervical Lymphadenopathy nor Thyromegaly or Carotid Bruit; No JVD; Breasts:: Not examined CHEST WALL: No tenderness CHEST:  Decreased Breath sounds Bilaterally, No Rales Rhonchi or Wheezes HEART: Regular rate and rhythm; no murmurs rubs or gallops BACK: No kyphosis or scoliosis; No CVA tenderness ABDOMEN: Positive Bowel Sounds, Scaphoid, Soft Non-Tender; No Masses, No Organomegaly. Rectal Exam: Not done EXTREMITIES: No Cyanosis, Clubbing, or Edema; No Ulcerations. Genitalia: not examined PULSES: 2+ and symmetric SKIN: Normal hydration no rash or ulceration CNS:    Vascular: pulses palpable throughout    Labs on Admission:  Basic Metabolic Panel:  Recent Labs Lab 05/22/14 2125  NA 140  K 3.8  CL 98  CO2 28  GLUCOSE 121*  BUN 11  CREATININE 1.00  CALCIUM 10.0   Liver Function Tests:  Recent Labs Lab 05/22/14 2125  AST 36  ALT 20  ALKPHOS 79  BILITOT 1.0  PROT 8.5*  ALBUMIN 4.3   No results found for this basename: LIPASE, AMYLASE,  in the last 168 hours No results found for this basename: AMMONIA,  in the last 168 hours CBC:  Recent Labs Lab 05/22/14 2125  WBC 7.6  NEUTROABS 5.7  HGB 15.1*  HCT 44.1  MCV 95.5  PLT 174   Cardiac Enzymes:  Recent Labs Lab 05/22/14 2139  TROPONINI <0.30    BNP (last 3  results) No results found for this basename: PROBNP,  in the last 8760 hours CBG: No results found for this basename: GLUCAP,  in the last 168 hours  Radiological Exams on Admission: Dg Chest 2 View  05/22/2014   CLINICAL DATA:  Dizziness. Lightheadedness. Chest congestion. History of COPD, asthma, smoking.  EXAM: CHEST  2 VIEW  COMPARISON:  12/11/2012, 03/05/2014  FINDINGS: The lungs are hyperinflated. Heart size is normal. There are no focal consolidations or pleural effusions.  IMPRESSION: No evidence for acute  abnormality.   Electronically Signed   By: Shon Hale M.D.   On: 05/22/2014 22:23     EKG: Independently reviewed. Normal Sinus Rhythm rate =91 No Acute S-T Changes   Assessment/Plan:   73 y.o. female with  Principal Problem:  1.   COPD exacerbation   Atrovent Nebs   Oral steroid Taper   Empiric Levaquin IV   Monitor O2 sats   O2 PRN     2.   Bronchitis   Empiric IV Levaquin   Mucinex BID PRN       3.   SOB (shortness of breath)- due to #1 and #2   O2 PRN     4.   Other and unspecified hyperlipidemia   Continue Zetia and Niacin Rx.       5.  Tobacco Use Disorder   Encouraged to Continue to Decrease until she has stopped smoking   Nicotine Patch while inpatient        5.   DVT Prophylaxis   Lovenox    Code Status:    FULL CODE Family Communication:   No Family at Bedside  Disposition Plan:     Inpatient  Time spent:  [place] [facility] Pager [phone]   If [place] Please Contact the Day Rounding Team MD for [facility]  If 7PM-7AM, Please Contact night-coverage  [email or web address] Password [email or web address] 05/23/2014, 1:48 AM

## 2014-05-23 NOTE — Progress Notes (Signed)
Utilization Review Completed.Donne Anon T9/17/2015

## 2014-05-23 NOTE — Progress Notes (Signed)
Received report from [place], [place] from [facility].

## 2014-05-23 NOTE — Progress Notes (Signed)
Pt arrived to unit alert and oriented x4. Oriented to room, unit, and staff.  Bed in lowest position and call bell is within reach. Will continue to monitor. 

## 2014-05-24 ENCOUNTER — Encounter (HOSPITAL_COMMUNITY): Payer: Self-pay | Admitting: General Practice

## 2014-05-24 ENCOUNTER — Inpatient Hospital Stay (HOSPITAL_COMMUNITY): Payer: Medicare HMO

## 2014-05-24 DIAGNOSIS — E440 Moderate protein-calorie malnutrition: Secondary | ICD-10-CM | POA: Insufficient documentation

## 2014-05-24 MED ORDER — ENSURE PUDDING PO PUDG
1.0000 | Freq: Three times a day (TID) | ORAL | Status: DC
  Administered 2014-05-24 – 2014-05-26 (×6): 1 via ORAL

## 2014-05-24 MED ORDER — NICOTINE 21 MG/24HR TD PT24
21.0000 mg | MEDICATED_PATCH | Freq: Every day | TRANSDERMAL | Status: DC
  Administered 2014-05-24 – 2014-05-26 (×3): 21 mg via TRANSDERMAL
  Filled 2014-05-24 (×3): qty 1

## 2014-05-24 MED ORDER — GUAIFENESIN ER 600 MG PO TB12
600.0000 mg | ORAL_TABLET | Freq: Two times a day (BID) | ORAL | Status: DC
  Administered 2014-05-24 – 2014-05-26 (×5): 600 mg via ORAL
  Filled 2014-05-24 (×7): qty 1

## 2014-05-24 MED ORDER — PREDNISONE 20 MG PO TABS
20.0000 mg | ORAL_TABLET | Freq: Every day | ORAL | Status: AC
  Administered 2014-05-24: 20 mg via ORAL
  Filled 2014-05-24: qty 1

## 2014-05-24 MED ORDER — PREDNISONE 10 MG PO TABS
10.0000 mg | ORAL_TABLET | Freq: Every day | ORAL | Status: AC
  Administered 2014-05-25: 10 mg via ORAL
  Filled 2014-05-24: qty 1

## 2014-05-24 MED ORDER — PIPERACILLIN-TAZOBACTAM 3.375 G IVPB
3.3750 g | Freq: Three times a day (TID) | INTRAVENOUS | Status: DC
  Administered 2014-05-24 – 2014-05-26 (×6): 3.375 g via INTRAVENOUS
  Filled 2014-05-24 (×9): qty 50

## 2014-05-24 MED ORDER — ENSURE COMPLETE PO LIQD
237.0000 mL | Freq: Three times a day (TID) | ORAL | Status: DC
  Administered 2014-05-24 – 2014-05-26 (×7): 237 mL via ORAL

## 2014-05-24 MED ORDER — SODIUM CHLORIDE 0.9 % IV SOLN
INTRAVENOUS | Status: DC
  Administered 2014-05-24: 1000 mL via INTRAVENOUS
  Administered 2014-05-25: 22:00:00 via INTRAVENOUS

## 2014-05-24 NOTE — Progress Notes (Signed)
Pts temp is 101.3. Tylenol given. On call provider paged. No new orders given.

## 2014-05-24 NOTE — Progress Notes (Signed)
INITIAL NUTRITION ASSESSMENT  DOCUMENTATION CODES Per approved criteria  -Non-severe (moderate) malnutrition in the context of chronic illness  Pt meets criteria for moderate MALNUTRITION in the context of chronic illness as evidenced by Poor PO intake of <75% x >1 month and mild depletion of muscle and subcutaneous fat.  INTERVENTION:  Provide Ensure Complete po TID, each supplement provides 350 kcal and 13 grams of protein  Encourage PO intake  Will continue to monitor  NUTRITION DIAGNOSIS: Inadequate oral intake related to decreased appetite as evidenced by poor PO intake, not consuming enough food to meet estimated needs.   Goal: Pt to meet >/= 90% of their estimated nutrition needs   Monitor:  PO and supplemental intake, weight, labs, I/O's  Reason for Assessment: Pt identified as at nutrition risk on the Malnutrition Screen Tool  Admitting Dx: COPD exacerbation  ASSESSMENT: 73 y.o. female with a history of COPD/Asthma, and Hyperlipidemia who presented to the [facility] Ed with complaints of URI symptoms for the past 2 days with nasal congestion and cough with development of fevers and chills over the past 24 hours.  PO intake: 0%, currently on heart healthy diet. Pt reports no weight loss, weight tends to fluctuate by 5 lb, per documentation weight has been stable. Pt reports always being "skinny".  Pt is feverish today and doesn't have much of an appetite. Pt reports consistently having a low appetite for years. She has to make herself eat anything before dinner.   When asked about diet PTA: pt reports drinking an Ensure with coffee for breakfast, and some days having a sandwich midday. Most days she won't eat anything until she sits down for a big meal for dinner. Pt states that she tries to drink 2 Ensures a day. Will send Ensure TID with meals.   Nutrition Focused Physical Exam:  Subcutaneous Fat:  Orbital Region: WNL Upper Arm Region: mild depletion Thoracic and Lumbar  Region: NA  Muscle:  Temple Region: WNL Clavicle Bone Region: mild depletion Clavicle and Acromion Bone Region: mild depletion Scapular Bone Region: mild depletion Dorsal Hand: WNL Patellar Region: WNL Anterior Thigh Region: mild depletion Posterior Calf Region: mild depletion  Edema: no LE edema  Lab reviewed: Glucose 163  Height: Ht Readings from Last 1 Encounters:  05/23/14 5\' 3"  (1.6 m)    Weight: Wt Readings from Last 1 Encounters:  05/23/14 116 lb 6.5 oz (52.8 kg)    Ideal Body Weight: 115 lb  % Ideal Body Weight: 101%  Wt Readings from Last 10 Encounters:  05/23/14 116 lb 6.5 oz (52.8 kg)  11/30/13 118 lb (53.524 kg)  11/22/13 121 lb 3.2 oz (54.976 kg)  04/27/13 113 lb 3.2 oz (51.347 kg)  01/25/13 113 lb 6.4 oz (51.438 kg)  12/15/12 112 lb (50.803 kg)    Usual Body Weight: 120 lb  % Usual Body Weight: 97%  BMI:  Body mass index is 20.63 kg/(m^2).  Estimated Nutritional Needs: Kcal: 1550-1700 Protein: 80-90g Fluid: 1.5L/day  Skin: intact  Diet Order: Cardiac  EDUCATION NEEDS: -No education needs identified at this time  No intake or output data in the 24 hours ending 05/24/14 1410  Last BM: 9/15  Labs:   Recent Labs Lab 05/22/14 2125 05/23/14 0729  NA 140 141  K 3.8 4.1  CL 98 103  CO2 28 25  BUN 11 12  CREATININE 1.00 0.84  CALCIUM 10.0 9.1  GLUCOSE 121* 163*    CBG (last 3)  No results found for this  basename: GLUCAP,  in the last 72 hours  Scheduled Meds: . aspirin  81 mg Oral Daily  . enoxaparin (LOVENOX) injection  40 mg Subcutaneous Q24H  . ezetimibe  10 mg Oral Daily  . feeding supplement (ENSURE COMPLETE)  237 mL Oral TID WC  . feeding supplement (ENSURE)  1 Container Oral TID BM  . guaiFENesin  600 mg Oral BID  . ipratropium  0.5 mg Nebulization BID  . levofloxacin (LEVAQUIN) IV  500 mg Intravenous Q24H  . niacin  500 mg Oral Daily  . nicotine  21 mg Transdermal Daily  . [START ON 05/25/2014] predniSONE  10 mg  Oral Q breakfast  . sodium chloride  3 mL Intravenous Q12H  . sodium chloride  3 mL Intravenous Q12H    Continuous Infusions:   Past Medical History  Diagnosis Date  . Hyperlipidemia   . Asthma   . COPD (chronic obstructive pulmonary disease)   . Shortness of breath   . GERD (gastroesophageal reflux disease)     Past Surgical History  Procedure Laterality Date  . Cesarean section    . Cataract extraction      left eye    Clayton Bibles, MS, [place] Licensed Dietitian Nutritionist Pager: [phone]

## 2014-05-24 NOTE — Care Management Note (Signed)
    Page 1 of 1   05/24/2014     3:38:15 PM CARE MANAGEMENT NOTE 05/24/2014  Patient:  [name], [name]   Account Number:  [id number]  Date Initiated:  05/24/2014  Documentation initiated by:  Tomi Bamberger  Subjective/Objective Assessment:   dx copd ex  admit- lives with nephew     Action/Plan:   Anticipated DC Date:  05/25/2014   Anticipated DC Plan:  [place]  CM consult      Choice offered to / List presented to:             Status of service:  Completed, signed off Medicare Important Message given?  YES (If response is "NO", the following Medicare IM given date fields will be blank) Date Medicare IM given:  05/24/2014 Medicare IM given by:  Tomi Bamberger Date Additional Medicare IM given:   Additional Medicare IM given by:    Discharge Disposition:  HOME/SELF CARE  Per UR Regulation:  Reviewed for med. necessity/level of care/duration of stay  If discussed at [place] of Stay Meetings, dates discussed:    Comments:

## 2014-05-24 NOTE — Progress Notes (Signed)
Patient Demographics  [name], is a 73 y.o. female, DOB - 09/12/1940, [id number]  Admit date - 05/22/2014   Admitting Physician Etta Quill, DO  Outpatient Primary MD for the patient is Salena Saner., MD  LOS - 2   Chief Complaint  Patient presents with  . Dizziness  . Cough      Brief narrative: [name] is a 73 y.o. female with a history of COPD/Asthma, and Hyperlipidemia who presented to the [facility] Ed with complaints of URI symptoms for the past 2 days with nasal congestion and cough with development of fevers and chills over the past  24 hours. She reports having worsening SOB and chest tightness. In the ED, she had a temperature of 103.2. The chest x-ray was negative for pneumonia, and she was placed on empiric IV Levaquin for Bronchitis and administered Atrovent Nebulizer  treatments and Solumedrol for COPD exacerbation, since patient has been switched to by mouth prednisone, has good respiratory effort, no wheezing, but still spikes a fever.    Subjective:   [name] today has, No headache, No chest pain, No abdominal pain - No Nausea, No new weakness tingling or numbness, still complains of cough, congestion, productive, had fever overnight 101.3 Assessment & Plan    Principal Problem:   COPD exacerbation Active Problems:   Bronchitis   SOB (shortness of breath)   Other and unspecified hyperlipidemia  1. COPD exacerbation  Atrovent Nebs  Oral steroid Taper  Empiric Levaquin IV  Monitor O2 sats  O2 PRN   2. Bronchitis  Empiric IV Levaquin  Mucinex BID PRN  Still spiking fever, repeat chest x-ray showing no opacity or infiltrate.   3. Other and unspecified hyperlipidemia  Continue Zetia and Niacin Rx.   4. Tobacco Use Disorder  Encouraged to Continue to Decrease until she has stopped smoking  Nicotine Patch  while inpatient   Code Status:  Full   Family Communication: Spoke with nephew yesterday at bedside.  Disposition Plan: home in 24 hours.   Procedures  none   Consults   nnoe   Medications  Scheduled Meds: . aspirin  81 mg Oral Daily  . enoxaparin (LOVENOX) injection  40 mg Subcutaneous Q24H  . ezetimibe  10 mg Oral Daily  . guaiFENesin  600 mg Oral BID  . ipratropium  0.5 mg Nebulization BID  . levofloxacin (LEVAQUIN) IV  500 mg Intravenous Q24H  . niacin  500 mg Oral Daily  . sodium chloride  3 mL Intravenous Q12H  . sodium chloride  3 mL Intravenous Q12H   Continuous Infusions:  PRN Meds:.sodium chloride, acetaminophen, HYDROmorphone (DILAUDID) injection, ondansetron (ZOFRAN) IV, ondansetron, oxyCODONE, sodium chloride, temazepam  DVT Prophylaxis  Lovenox -SCDs  Lab Results  Component Value Date   PLT 174 05/23/2014    Antibiotics   Anti-infectives   Start     Dose/Rate Route Frequency Ordered Stop   05/23/14 2200  levofloxacin (LEVAQUIN) IVPB 500 mg     500 mg 100 mL/hr over 60 Minutes Intravenous Every 24 hours 05/23/14 0146     05/22/14 2245  levofloxacin (LEVAQUIN) IVPB 750 mg     750 mg 100 mL/hr over 90 Minutes Intravenous  Once 05/22/14 2232 05/23/14 0009  Objective:   Filed Vitals:   05/24/14 0336 05/24/14 0440 05/24/14 0511 05/24/14 0618  BP:   116/52   Pulse:   71   Temp: 101.3 F (38.5 C) 99.7 F (37.6 C) 100.7 F (38.2 C) 99.7 F (37.6 C)  TempSrc:  Oral Oral Oral  Resp:   18   Height:      Weight:      SpO2:   96%     Wt Readings from Last 3 Encounters:  05/23/14 52.8 kg (116 lb 6.5 oz)  11/30/13 53.524 kg (118 lb)  11/22/13 54.976 kg (121 lb 3.2 oz)    No intake or output data in the 24 hours ending 05/24/14 1119   Physical Exam  Awake Alert, Oriented X 3, No new F.N deficits, Normal affect [place].AT,PERRAL Supple Neck,No JVD, No cervical lymphadenopathy appriciated.  Symmetrical Chest wall movement, Good air  movement bilaterally, CTAB RRR,No Gallops,Rubs or new Murmurs, No Parasternal Heave +ve B.Sounds, Abd Soft, No tenderness, No organomegaly appriciated, No rebound - guarding or rigidity. No Cyanosis, Clubbing or edema, No new Rash or bruise     Data Review   Micro Results Recent Results (from the past 240 hour(s))  CULTURE, BLOOD (ROUTINE X 2)     Status: None   Collection Time    05/22/14  9:25 PM      Result Value Ref Range Status   Specimen Description BLOOD RIGHT ARM   Final   Special Requests     Final   Value: BOTTLES DRAWN AEROBIC AND ANAEROBIC AER 5cc ANA 5cc   Culture  Setup Time     Final   Value: 05/22/2014 22:41     Performed at [facility]   Culture     Final   Value:        BLOOD CULTURE RECEIVED NO GROWTH TO DATE CULTURE WILL BE HELD FOR 5 DAYS BEFORE ISSUING A FINAL NEGATIVE REPORT     Performed at [facility]   Report Status PENDING   Incomplete  CULTURE, BLOOD (ROUTINE X 2)     Status: None   Collection Time    05/22/14  9:35 PM      Result Value Ref Range Status   Specimen Description BLOOD LEFT ARM   Final   Special Requests     Final   Value: BOTTLES DRAWN AEROBIC AND ANAEROBIC AER 5cc ANA 5cc   Culture  Setup Time     Final   Value: 05/22/2014 22:42     Performed at [facility]   Culture     Final   Value:        BLOOD CULTURE RECEIVED NO GROWTH TO DATE CULTURE WILL BE HELD FOR 5 DAYS BEFORE ISSUING A FINAL NEGATIVE REPORT     Performed at [facility]   Report Status PENDING   Incomplete  URINE CULTURE     Status: None   Collection Time    05/22/14  9:37 PM      Result Value Ref Range Status   Specimen Description URINE, CLEAN CATCH   Final   Special Requests NONE   Final   Culture  Setup Time     Final   Value: 05/22/2014 23:04     Performed at [facility]     Final   Value: 4,000 COLONIES/ML     Performed at [facility]     Final  Value: INSIGNIFICANT GROWTH      Performed at [facility]   Report Status 05/23/2014 FINAL   Final  RAPID STREP SCREEN     Status: None   Collection Time    05/22/14  9:48 PM      Result Value Ref Range Status   Streptococcus, Group A Screen (Direct) NEGATIVE  NEGATIVE Final   Comment: (NOTE)     A Rapid Antigen test may result negative if the antigen level in the     sample is below the detection level of this test. The FDA has not     cleared this test as a stand-alone test therefore the rapid antigen     negative result has reflexed to a Group A Strep culture.  CULTURE, GROUP A STREP     Status: None   Collection Time    05/22/14  9:48 PM      Result Value Ref Range Status   Specimen Description THROAT   Final   Special Requests NONE   Final   Culture     Final   Value: NO SUSPICIOUS COLONIES, CONTINUING TO HOLD     Performed at [facility]   Report Status PENDING   Incomplete    Radiology Reports Dg Chest 2 View  05/24/2014   CLINICAL DATA:  Fever with cough and dizziness.  EXAM: CHEST  2 VIEW  COMPARISON:  Radiographs 05/22/2014.  CT 03/05/2014.  FINDINGS: The heart size and mediastinal contours are normal. The lungs are clear. There is no pleural effusion or pneumothorax. No acute osseous findings are identified. Telemetry leads overlie the chest.  IMPRESSION: Stable chest.  No active cardiopulmonary process.   Electronically Signed   By: Camie Patience M.D.   On: 05/24/2014 08:14   Dg Chest 2 View  05/22/2014   CLINICAL DATA:  Dizziness. Lightheadedness. Chest congestion. History of COPD, asthma, smoking.  EXAM: CHEST  2 VIEW  COMPARISON:  12/11/2012, 03/05/2014  FINDINGS: The lungs are hyperinflated. Heart size is normal. There are no focal consolidations or pleural effusions.  IMPRESSION: No evidence for acute  abnormality.   Electronically Signed   By: Shon Hale M.D.   On: 05/22/2014 22:23    CBC  Recent Labs Lab 05/22/14 2125 05/23/14 0729  WBC 7.6 5.3  HGB 15.1* 14.0  HCT 44.1  41.6  PLT 174 174  MCV 95.5 95.6  MCH 32.7 32.2  MCHC 34.2 33.7  RDW 14.9 14.9  LYMPHSABS 1.1  --   MONOABS 0.8  --   EOSABS 0.0  --   BASOSABS 0.0  --     Chemistries   Recent Labs Lab 05/22/14 2125 05/23/14 0729  NA 140 141  K 3.8 4.1  CL 98 103  CO2 28 25  GLUCOSE 121* 163*  BUN 11 12  CREATININE 1.00 0.84  CALCIUM 10.0 9.1  AST 36  --   ALT 20  --   ALKPHOS 79  --   BILITOT 1.0  --    ------------------------------------------------------------------------------------------------------------------ estimated creatinine clearance is 49.3 ml/min (by C-G formula based on Cr of 0.84). ------------------------------------------------------------------------------------------------------------------ No results found for this basename: HGBA1C,  in the last 72 hours ------------------------------------------------------------------------------------------------------------------ No results found for this basename: CHOL, HDL, LDLCALC, TRIG, CHOLHDL, LDLDIRECT,  in the last 72 hours ------------------------------------------------------------------------------------------------------------------ No results found for this basename: TSH, T4TOTAL, FREET3, T3FREE, THYROIDAB,  in the last 72 hours ------------------------------------------------------------------------------------------------------------------ No results found for this basename: VITAMINB12, FOLATE, FERRITIN, TIBC, IRON, RETICCTPCT,  in the last 72 hours  Coagulation profile No results found for this basename: INR, PROTIME,  in the last 168 hours  No results found for this basename: DDIMER,  in the last 72 hours  Cardiac Enzymes  Recent Labs Lab 05/22/14 2139  TROPONINI <0.30   ------------------------------------------------------------------------------------------------------------------ No components found with this basename: POCBNP,      Time Spent in minutes   30 minutes   Erilyn Pearman M.D  on 05/24/2014 at 11:19 AM  Between 7am to 7pm - Pager - [phone]  After 7pm go to [email or web address] - password [email or web address]  And look for the night coverage person covering for me after hours  [facility] Office  [phone]

## 2014-05-24 NOTE — Progress Notes (Signed)
Note/chart reviewed.  Katie Yesli Vanderhoff, RD, LDN Pager #: [phone] After-Hours Pager #: [phone]  

## 2014-05-25 LAB — CULTURE, GROUP A STREP

## 2014-05-25 LAB — T4, FREE: FREE T4: 1.04 ng/dL (ref 0.80–1.80)

## 2014-05-25 LAB — TSH: TSH: 1.84 u[IU]/mL (ref 0.350–4.500)

## 2014-05-25 NOTE — Progress Notes (Signed)
Patient Demographics  [name], is a 73 y.o. female, DOB - May 27, 1941, HFW:[id number]  Admit date - 05/22/2014   Admitting Physician Etta Quill, DO  Outpatient Primary MD for the patient is Salena Saner., MD  LOS - 3   Chief Complaint  Patient presents with  . Dizziness  . Cough      Brief narrative: [name] is a 73 y.o. female with a history of COPD/Asthma, and Hyperlipidemia who presented to the [facility] Ed with complaints of URI symptoms for the past 2 days with nasal congestion and cough with development of fevers and chills over the past  24 hours. She reports having worsening SOB and chest tightness. In the ED, she had a temperature of 103.2. The chest x-ray was negative for pneumonia, and she was placed on empiric IV Levaquin for Bronchitis and administered Atrovent Nebulizer  treatments and Solumedrol for COPD exacerbation, since patient has been switched to by mouth prednisone, has good respiratory effort, no wheezing, but continues to spike fever,  So antibiotic was changed from IV Levaquin to IV Zosyn on 9/18, CT chest was obtained on 9/18, which did not show any evidence of pneumonia or infiltrate, it did show thyroid nodule, which patient reports it has been all, known to her since 2010, and was worked up by here PCP.    Subjective:   [name] today has, No headache, No chest pain, No abdominal pain - No Nausea, No new weakness tingling or numbness, still complains of cough, congestion, productive, had fever yesterday evening at 101. Assessment & Plan    Principal Problem:   COPD exacerbation Active Problems:   Bronchitis   SOB (shortness of breath)   Other and unspecified hyperlipidemia   Malnutrition of moderate degree   Moderate malnutrition  1. COPD exacerbation  Atrovent Nebs  Oral steroid Taper  Empiric Levaquin  IV 9/16- 9/18, changed to IV Zosyn on 9/18 Monitor O2 sats  O2 PRN   2. Bronchitis  Continues to spike fever, so antibiotic was changed from IV Levaquin to IV Zosyn on 9/18. Empiric IV Zosyn Mucinex BID PRN  Still spiking fever, CT chest did not show any evidence of pneumonia.   3. Other and unspecified hyperlipidemia  Continue Zetia and Niacin Rx.   4. Tobacco Use Disorder  Encouraged to Continue to Decrease until she has stopped smoking  Nicotine Patch while inpatient   5. Incidental finding of thyroid nodule on CT chest. As an outpatient, had workup by her PCP in 2010 as per the patient, size remained stable 2.5 cm once compared to ultrasound done in 2010. Normal TSH.  Code Status:  Full   Family Communication: Spoke with nephew at bedside.  Disposition Plan: home in 24 hours .   Procedures  none   Consults   nnoe   Medications  Scheduled Meds: . aspirin  81 mg Oral Daily  . enoxaparin (LOVENOX) injection  40 mg Subcutaneous Q24H  . ezetimibe  10 mg Oral Daily  . feeding supplement (ENSURE COMPLETE)  237 mL Oral TID WC  . feeding supplement (ENSURE)  1 Container Oral TID BM  . guaiFENesin  600 mg Oral BID  . ipratropium  0.5 mg Nebulization BID  .  niacin  500 mg Oral Daily  . nicotine  21 mg Transdermal Daily  . piperacillin-tazobactam (ZOSYN)  IV  3.375 g Intravenous 3 times per day  . sodium chloride  3 mL Intravenous Q12H  . sodium chloride  3 mL Intravenous Q12H   Continuous Infusions: . sodium chloride 1,000 mL (05/24/14 1814)   PRN Meds:.sodium chloride, acetaminophen, HYDROmorphone (DILAUDID) injection, ondansetron (ZOFRAN) IV, ondansetron, oxyCODONE, sodium chloride, temazepam  DVT Prophylaxis  Lovenox -SCDs  Lab Results  Component Value Date   PLT 174 05/23/2014    Antibiotics   Anti-infectives   Start     Dose/Rate Route Frequency Ordered Stop   05/24/14 1800  piperacillin-tazobactam (ZOSYN) IVPB 3.375 g     3.375 g 12.5 mL/hr over 240  Minutes Intravenous 3 times per day 05/24/14 1750     05/23/14 2200  levofloxacin (LEVAQUIN) IVPB 500 mg  Status:  Discontinued     500 mg 100 mL/hr over 60 Minutes Intravenous Every 24 hours 05/23/14 0146 05/24/14 1750   05/22/14 2245  levofloxacin (LEVAQUIN) IVPB 750 mg     750 mg 100 mL/hr over 90 Minutes Intravenous  Once 05/22/14 2232 05/25/14 0035          Objective:   Filed Vitals:   05/25/14 0522 05/25/14 0839 05/25/14 1025 05/25/14 1407  BP: 118/66  133/86 119/59  Pulse: 62  84 74  Temp: 98.4 F (36.9 C)  98.3 F (36.8 C) 98.2 F (36.8 C)  TempSrc: Oral  Oral Oral  Resp: 18  18 16   Height:      Weight:      SpO2: 95% 96% 99% 100%    Wt Readings from Last 3 Encounters:  05/23/14 52.8 kg (116 lb 6.5 oz)  11/30/13 53.524 kg (118 lb)  11/22/13 54.976 kg (121 lb 3.2 oz)     Intake/Output Summary (Last 24 hours) at 05/25/14 1605 Last data filed at 05/24/14 1925  Gross per 24 hour  Intake    240 ml  Output      0 ml  Net    240 ml     Physical Exam  Awake Alert, Oriented X 3, No new F.N deficits, Normal affect [place].AT,PERRAL Supple Neck,No JVD, No cervical lymphadenopathy appriciated.  Symmetrical Chest wall movement, Good air movement bilaterally, CTAB RRR,No Gallops,Rubs or new Murmurs, No Parasternal Heave +ve B.Sounds, Abd Soft, No tenderness, No organomegaly appriciated, No rebound - guarding or rigidity. No Cyanosis, Clubbing or edema, No new Rash or bruise     Data Review   Micro Results Recent Results (from the past 240 hour(s))  CULTURE, BLOOD (ROUTINE X 2)     Status: None   Collection Time    05/22/14  9:25 PM      Result Value Ref Range Status   Specimen Description BLOOD RIGHT ARM   Final   Special Requests     Final   Value: BOTTLES DRAWN AEROBIC AND ANAEROBIC AER 5cc ANA 5cc   Culture  Setup Time     Final   Value: 05/22/2014 22:41     Performed at [facility]   Culture     Final   Value:        BLOOD CULTURE RECEIVED NO  GROWTH TO DATE CULTURE WILL BE HELD FOR 5 DAYS BEFORE ISSUING A FINAL NEGATIVE REPORT     Performed at [facility]   Report Status PENDING   Incomplete  CULTURE, BLOOD (ROUTINE X 2)  Status: None   Collection Time    05/22/14  9:35 PM      Result Value Ref Range Status   Specimen Description BLOOD LEFT ARM   Final   Special Requests     Final   Value: BOTTLES DRAWN AEROBIC AND ANAEROBIC AER 5cc ANA 5cc   Culture  Setup Time     Final   Value: 05/22/2014 22:42     Performed at [facility]   Culture     Final   Value:        BLOOD CULTURE RECEIVED NO GROWTH TO DATE CULTURE WILL BE HELD FOR 5 DAYS BEFORE ISSUING A FINAL NEGATIVE REPORT     Performed at [facility]   Report Status PENDING   Incomplete  URINE CULTURE     Status: None   Collection Time    05/22/14  9:37 PM      Result Value Ref Range Status   Specimen Description URINE, CLEAN CATCH   Final   Special Requests NONE   Final   Culture  Setup Time     Final   Value: 05/22/2014 23:04     Performed at [facility]     Final   Value: 4,000 COLONIES/ML     Performed at [facility]   Culture     Final   Value: INSIGNIFICANT GROWTH     Performed at [facility]   Report Status 05/23/2014 FINAL   Final  RAPID STREP SCREEN     Status: None   Collection Time    05/22/14  9:48 PM      Result Value Ref Range Status   Streptococcus, Group A Screen (Direct) NEGATIVE  NEGATIVE Final   Comment: (NOTE)     A Rapid Antigen test may result negative if the antigen level in the     sample is below the detection level of this test. The FDA has not     cleared this test as a stand-alone test therefore the rapid antigen     negative result has reflexed to a Group A Strep culture.  CULTURE, GROUP A STREP     Status: None   Collection Time    05/22/14  9:48 PM      Result Value Ref Range Status   Specimen Description THROAT   Final   Special Requests NONE   Final    Culture     Final   Value: No Beta Hemolytic Streptococci Isolated     Performed at [facility]   Report Status 05/25/2014 FINAL   Final    Radiology Reports Dg Chest 2 View  05/24/2014   CLINICAL DATA:  Fever with cough and dizziness.  EXAM: CHEST  2 VIEW  COMPARISON:  Radiographs 05/22/2014.  CT 03/05/2014.  FINDINGS: The heart size and mediastinal contours are normal. The lungs are clear. There is no pleural effusion or pneumothorax. No acute osseous findings are identified. Telemetry leads overlie the chest.  IMPRESSION: Stable chest.  No active cardiopulmonary process.   Electronically Signed   By: Camie Patience M.D.   On: 05/24/2014 08:14   Ct Chest Wo Contrast  05/24/2014   CLINICAL DATA:  Fever. Productive cough. No acute abnormality on chest imaging earlier same date.  EXAM: CT CHEST WITHOUT CONTRAST  TECHNIQUE: Multidetector CT imaging of the chest was performed following the standard protocol without IV contrast.  COMPARISON:  Two-view chest x-ray earlier  same date and 05/22/2014. Low-dose screening CT chest 03/05/2014, 11/28/2013.  FINDINGS: Approximate 11 mm nodular opacity in the posterior left upper lobe, pleural-based adjacent to the major fissure, unchanged dating back to the 11/28/2013 examination. No new or enlarging nodules in either lung. Emphysematous changes diffusely throughout both lungs. Minimal biapical pleuroparenchymal scarring, unchanged. No confluent airspace consolidation. No pleural effusions. Central airways patent with mild central bronchial wall thickening, unchanged.  Normal heart size. No pericardial effusion. Moderate LAD and right coronary atherosclerosis, unchanged. Moderate atherosclerosis involving the thoracic and upper abdominal aorta without evidence of aneurysm or dissection.  No significant mediastinal, hilar or axillary lymphadenopathy. Stable enlargement of the right lobe of the thyroid gland with an approximate 2.5 cm nodule in the lower pole of  the right lobe, unchanged.  Visualized upper abdomen unremarkable for the unenhanced technique. Bone window images demonstrate osseous demineralization and minimal mid thoracic spondylosis.  IMPRESSION: 1. COPD/emphysema.  No acute cardiopulmonary disease. 2. Stable approximate 11 mm pleural-based nodular opacity in the posterior left upper lobe. I note that this will be followed up as the patient is undergoing annual screening CT for lung cancer. 3. No new or enlarging pulmonary nodules. 4. Approximate 2.5 cm nodule involving the lower pole of the right lobe of the thyroid gland. Further evaluation with thyroid ultrasound is recommended. This follows ACR consensus guidelines: Managing Incidental Thyroid Nodules Detected on Imaging: White Paper of the ACR Incidental Thyroid Findings Committee. J Am Coll Radiol 2015; 12:143-150. 5. Stable moderate LAD and right coronary atherosclerosis.   Electronically Signed   By: Evangeline Dakin M.D.   On: 05/24/2014 19:10    CBC  Recent Labs Lab 05/22/14 2125 05/23/14 0729  WBC 7.6 5.3  HGB 15.1* 14.0  HCT 44.1 41.6  PLT 174 174  MCV 95.5 95.6  MCH 32.7 32.2  MCHC 34.2 33.7  RDW 14.9 14.9  LYMPHSABS 1.1  --   MONOABS 0.8  --   EOSABS 0.0  --   BASOSABS 0.0  --     Chemistries   Recent Labs Lab 05/22/14 2125 05/23/14 0729  NA 140 141  K 3.8 4.1  CL 98 103  CO2 28 25  GLUCOSE 121* 163*  BUN 11 12  CREATININE 1.00 0.84  CALCIUM 10.0 9.1  AST 36  --   ALT 20  --   ALKPHOS 79  --   BILITOT 1.0  --    ------------------------------------------------------------------------------------------------------------------ estimated creatinine clearance is 49.3 ml/min (by C-G formula based on Cr of 0.84). ------------------------------------------------------------------------------------------------------------------ No results found for this basename: HGBA1C,  in the last 72  hours ------------------------------------------------------------------------------------------------------------------ No results found for this basename: CHOL, HDL, LDLCALC, TRIG, CHOLHDL, LDLDIRECT,  in the last 72 hours ------------------------------------------------------------------------------------------------------------------  Recent Labs  05/25/14 0930  TSH 1.840   ------------------------------------------------------------------------------------------------------------------ No results found for this basename: VITAMINB12, FOLATE, FERRITIN, TIBC, IRON, RETICCTPCT,  in the last 72 hours  Coagulation profile No results found for this basename: INR, PROTIME,  in the last 168 hours  No results found for this basename: DDIMER,  in the last 72 hours  Cardiac Enzymes  Recent Labs Lab 05/22/14 2139  TROPONINI <0.30   ------------------------------------------------------------------------------------------------------------------ No components found with this basename: POCBNP,      Time Spent in minutes   30 minutes   Espiridion Supinski M.D on 05/25/2014 at 4:05 PM  Between 7am to 7pm - Pager - [phone]  After 7pm go to [email or web address] - password [email or web address]  And look for the night coverage person  covering for me after hours  [facility] Office  [phone]

## 2014-05-26 LAB — BASIC METABOLIC PANEL
Anion gap: 9 (ref 5–15)
BUN: 12 mg/dL (ref 6–23)
CO2: 30 mEq/L (ref 19–32)
CREATININE: 0.9 mg/dL (ref 0.50–1.10)
Calcium: 8.8 mg/dL (ref 8.4–10.5)
Chloride: 106 mEq/L (ref 96–112)
GFR calc Af Amer: 72 mL/min — ABNORMAL LOW (ref >90)
GFR, EST NON AFRICAN AMERICAN: 62 mL/min — AB (ref >90)
GLUCOSE: 86 mg/dL (ref 70–99)
POTASSIUM: 4 meq/L (ref 3.7–5.3)
Sodium: 145 mEq/L (ref 137–147)

## 2014-05-26 LAB — CBC
HEMATOCRIT: 36.8 % (ref 36.0–46.0)
HEMOGLOBIN: 12.5 g/dL (ref 12.0–15.0)
MCH: 32 pg (ref 26.0–34.0)
MCHC: 34 g/dL (ref 30.0–36.0)
MCV: 94.1 fL (ref 78.0–100.0)
Platelets: 166 10*3/uL (ref 150–400)
RBC: 3.91 MIL/uL (ref 3.87–5.11)
RDW: 15.4 % (ref 11.5–15.5)
WBC: 5.9 10*3/uL (ref 4.0–10.5)

## 2014-05-26 MED ORDER — LORATADINE 10 MG PO TABS: 10.0000 mg | ORAL_TABLET | Freq: Every day | ORAL | Status: DC

## 2014-05-26 MED ORDER — NICOTINE 21 MG/24HR TD PT24: 21.0000 mg | MEDICATED_PATCH | Freq: Every day | TRANSDERMAL | Status: DC

## 2014-05-26 MED ORDER — AMOXICILLIN-POT CLAVULANATE 875-125 MG PO TABS: 1.0000 | ORAL_TABLET | Freq: Two times a day (BID) | ORAL | Status: AC

## 2014-05-26 MED ORDER — GUAIFENESIN ER 600 MG PO TB12: 600.0000 mg | ORAL_TABLET | Freq: Two times a day (BID) | ORAL | Status: AC

## 2014-05-26 MED ORDER — LACTINEX PO CHEW: 1.0000 | CHEWABLE_TABLET | Freq: Three times a day (TID) | ORAL | Status: AC

## 2014-05-26 MED ORDER — PANTOPRAZOLE SODIUM 40 MG PO TBEC: 40.0000 mg | DELAYED_RELEASE_TABLET | Freq: Every day | ORAL | Status: DC

## 2014-05-26 MED ORDER — METHYLPREDNISOLONE (PAK) 4 MG PO TABS: ORAL_TABLET | ORAL | Status: DC

## 2014-05-26 NOTE — Discharge Summary (Signed)
[name], 73 y.o., DOB 10-29-1940, MRN [id number]. Admission date: 05/22/2014 Discharge Date 05/26/2014 Primary MD Salena Saner., MD Admitting Physician Etta Quill, DO  Admission Diagnosis  Bronchitis [490] COPD exacerbation [491.21]  Discharge Diagnosis   Principal Problem:   COPD exacerbation Active Problems:   Bronchitis   SOB (shortness of breath)   Other and unspecified hyperlipidemia   Malnutrition of moderate degree   Moderate malnutrition   Past Medical History  Diagnosis Date  . Hyperlipidemia   . Asthma   . COPD (chronic obstructive pulmonary disease)   . Shortness of breath   . GERD (gastroesophageal reflux disease)     Past Surgical History  Procedure Laterality Date  . Cesarean section    . Cataract extraction      left eye   Brief narrative:  [name] is a 73 y.o. female with a history of COPD/Asthma, and Hyperlipidemia who presented to the [facility] with complaints of URI symptoms for the past 2 days with nasal congestion and cough with development of fevers and chills over the past 24 hours. She reports having worsening SOB and chest tightness. In the ED, she had a temperature of 103.2. The chest x-ray was negative for pneumonia, and she was placed on empiric IV Levaquin for Bronchitis and administered Atrovent Nebulizer  treatments and Solumedrol for COPD exacerbation, since patient has been switched to by mouth prednisone, has good respiratory effort, no wheezing, but continues to spike fever, So antibiotic was changed from IV Levaquin to IV Zosyn on 9/18, CT chest was obtained on 9/18, which did not show any evidence of pneumonia or infiltrate, it did show thyroid nodule, which patient reports it has been  known to her since 2010, and was worked up by here PCP. Her free T4 and TSH level were within normal limits, as well there is no change in size once compared to imaging done in 2010. Patient has been afebrile for the last 24 hours, no  wheezing, no respiratory distress, or choking well on room air, so she will be discharged home on prednisone taper back, by mouth Augmentin for 7 days, and we'll discharge her on ranitidine, and Protonix, as she has some cough at night and early morning suspicious for postnasal drip and GERD.   Hospital Course See H&P, Labs, Consult and Test reports for all details in brief, patient was admitted for **  Principal Problem:   COPD exacerbation Active Problems:   Bronchitis   SOB (shortness of breath)   Other and unspecified hyperlipidemia   Malnutrition of moderate degree   Moderate malnutrition  1. COPD exacerbation   Oral steroid Taper  Empiric Levaquin IV 9/16- 9/18, changed to IV Zosyn on 9/18 till 9/18. Will be discharged on total of 7 days of by mouth Augmentin.  2. Bronchitis  Was on IV Levaquin since admission 9/16, antibiotic was changed from IV Levaquin to IV Zosyn on 9/18.   Mucinex BID PRN  CT chest did not show any evidence of pneumonia.  3. Other and unspecified hyperlipidemia  Continue Zetia and Niacin Rx.  4. Tobacco Use Disorder  Encouraged to Continue to Decrease until she has stopped smoking   continue with Nicotine Patch after discharge. 5. Incidental finding of thyroid nodule on CT chest.  As an outpatient, had workup by her PCP in 2010 as per the patient, size remained stable 2.5 cm once compared to ultrasound done in 2010. Normal TSH and free T4.  Significant Tests:  See full reports for all details    Dg Chest 2 View  05/24/2014   CLINICAL DATA:  Fever with cough and dizziness.  EXAM: CHEST  2 VIEW  COMPARISON:  Radiographs 05/22/2014.  CT 03/05/2014.  FINDINGS: The heart size and mediastinal contours are normal. The lungs are clear. There is no pleural effusion or pneumothorax. No acute osseous findings are identified. Telemetry leads overlie the chest.  IMPRESSION: Stable chest.  No active cardiopulmonary process.   Electronically Signed   By: Camie Patience M.D.   On: 05/24/2014 08:14   Dg Chest 2 View  05/22/2014   CLINICAL DATA:  Dizziness. Lightheadedness. Chest congestion. History of COPD, asthma, smoking.  EXAM: CHEST  2 VIEW  COMPARISON:  12/11/2012, 03/05/2014  FINDINGS: The lungs are hyperinflated. Heart size is normal. There are no focal consolidations or pleural effusions.  IMPRESSION: No evidence for acute  abnormality.   Electronically Signed   By: Shon Hale M.D.   On: 05/22/2014 22:23   Ct Chest Wo Contrast  05/24/2014   CLINICAL DATA:  Fever. Productive cough. No acute abnormality on chest imaging earlier same date.  EXAM: CT CHEST WITHOUT CONTRAST  TECHNIQUE: Multidetector CT imaging of the chest was performed following the standard protocol without IV contrast.  COMPARISON:  Two-view chest x-ray earlier same date and 05/22/2014. Low-dose screening CT chest 03/05/2014, 11/28/2013.  FINDINGS: Approximate 11 mm nodular opacity in the posterior left upper lobe, pleural-based adjacent to the major fissure, unchanged dating back to the 11/28/2013 examination. No new or enlarging nodules in either lung. Emphysematous changes diffusely throughout both lungs. Minimal biapical pleuroparenchymal scarring, unchanged. No confluent airspace consolidation. No pleural effusions. Central airways patent with mild central bronchial wall thickening, unchanged.  Normal heart size. No pericardial effusion. Moderate LAD and right coronary atherosclerosis, unchanged. Moderate atherosclerosis involving the thoracic and upper abdominal aorta without evidence of aneurysm or dissection.  No significant mediastinal, hilar or axillary lymphadenopathy. Stable enlargement of the right lobe of the thyroid gland with an approximate 2.5 cm nodule in the lower pole of the right lobe, unchanged.  Visualized upper abdomen unremarkable for the unenhanced technique. Bone window images demonstrate osseous demineralization and minimal mid thoracic spondylosis.  IMPRESSION: 1.  COPD/emphysema.  No acute cardiopulmonary disease. 2. Stable approximate 11 mm pleural-based nodular opacity in the posterior left upper lobe. I note that this will be followed up as the patient is undergoing annual screening CT for lung cancer. 3. No new or enlarging pulmonary nodules. 4. Approximate 2.5 cm nodule involving the lower pole of the right lobe of the thyroid gland. Further evaluation with thyroid ultrasound is recommended. This follows ACR consensus guidelines: Managing Incidental Thyroid Nodules Detected on Imaging: White Paper of the ACR Incidental Thyroid Findings Committee. J Am Coll Radiol 2015; 12:143-150. 5. Stable moderate LAD and right coronary atherosclerosis.   Electronically Signed   By: Evangeline Dakin M.D.   On: 05/24/2014 19:10     Today   Subjective:   [name] today has no headache,no chest abdominal pain,no new weakness tingling or numbness, feels much better wants to go home today.   Objective:   Blood pressure 119/63, pulse 55, temperature 98.4 F (36.9 C), temperature source Oral, resp. rate 16, height 5\' 3"  (1.6 m), weight 52.8 kg (116 lb 6.5 oz), SpO2 98.00%.  Intake/Output Summary (Last 24 hours) at 05/26/14 1009 Last data filed at 05/26/14 0938  Gross per 24 hour  Intake 1128.75 ml  Output      0 ml  Net 1128.75 ml    Exam Awake Alert, Oriented *3, No new F.N deficits, Normal affect [place].AT,PERRAL Supple Neck,No JVD, No cervical lymphadenopathy appriciated.  Symmetrical Chest wall movement, Good air movement bilaterally, CTAB RRR,No Gallops,Rubs or new Murmurs, No Parasternal Heave +ve B.Sounds, Abd Soft, Non tender, No organomegaly appriciated, No rebound -guarding or rigidity. No Cyanosis, Clubbing or edema, No new Rash or bruise  Data Review     CBC w Diff: Lab Results  Component Value Date   WBC 5.9 05/26/2014   HGB 12.5 05/26/2014   HCT 36.8 05/26/2014   PLT 166 05/26/2014   LYMPHOPCT 15 05/22/2014   MONOPCT 10 05/22/2014   EOSPCT  0 05/22/2014   BASOPCT 0 05/22/2014   CMP: Lab Results  Component Value Date   NA 145 05/26/2014   K 4.0 05/26/2014   CL 106 05/26/2014   CO2 30 05/26/2014   BUN 12 05/26/2014   CREATININE 0.90 05/26/2014   PROT 8.5* 05/22/2014   ALBUMIN 4.3 05/22/2014   BILITOT 1.0 05/22/2014   ALKPHOS 79 05/22/2014   AST 36 05/22/2014   ALT 20 05/22/2014  .  Micro Results Recent Results (from the past 240 hour(s))  CULTURE, BLOOD (ROUTINE X 2)     Status: None   Collection Time    05/22/14  9:25 PM      Result Value Ref Range Status   Specimen Description BLOOD RIGHT ARM   Final   Special Requests     Final   Value: BOTTLES DRAWN AEROBIC AND ANAEROBIC AER 5cc ANA 5cc   Culture  Setup Time     Final   Value: 05/22/2014 22:41     Performed at [facility]   Culture     Final   Value:        BLOOD CULTURE RECEIVED NO GROWTH TO DATE CULTURE WILL BE HELD FOR 5 DAYS BEFORE ISSUING A FINAL NEGATIVE REPORT     Performed at [facility]   Report Status PENDING   Incomplete  CULTURE, BLOOD (ROUTINE X 2)     Status: None   Collection Time    05/22/14  9:35 PM      Result Value Ref Range Status   Specimen Description BLOOD LEFT ARM   Final   Special Requests     Final   Value: BOTTLES DRAWN AEROBIC AND ANAEROBIC AER 5cc ANA 5cc   Culture  Setup Time     Final   Value: 05/22/2014 22:42     Performed at [facility]   Culture     Final   Value:        BLOOD CULTURE RECEIVED NO GROWTH TO DATE CULTURE WILL BE HELD FOR 5 DAYS BEFORE ISSUING A FINAL NEGATIVE REPORT     Performed at [facility]   Report Status PENDING   Incomplete  URINE CULTURE     Status: None   Collection Time    05/22/14  9:37 PM      Result Value Ref Range Status   Specimen Description URINE, CLEAN CATCH   Final   Special Requests NONE   Final   Culture  Setup Time     Final   Value: 05/22/2014 23:04     Performed at [facility]     Final   Value: 4,000 COLONIES/ML      Performed at [facility]  Culture     Final   Value: INSIGNIFICANT GROWTH     Performed at [facility]   Report Status 05/23/2014 FINAL   Final  RAPID STREP SCREEN     Status: None   Collection Time    05/22/14  9:48 PM      Result Value Ref Range Status   Streptococcus, Group A Screen (Direct) NEGATIVE  NEGATIVE Final   Comment: (NOTE)     A Rapid Antigen test may result negative if the antigen level in the     sample is below the detection level of this test. The FDA has not     cleared this test as a stand-alone test therefore the rapid antigen     negative result has reflexed to a Group A Strep culture.  CULTURE, GROUP A STREP     Status: None   Collection Time    05/22/14  9:48 PM      Result Value Ref Range Status   Specimen Description THROAT   Final   Special Requests NONE   Final   Culture     Final   Value: No Beta Hemolytic Streptococci Isolated     Performed at [facility]   Report Status 05/25/2014 FINAL   Final     Discharge Instructions    Follow up with your PCP in 5 days.  Follow-up Information   Follow up with Salena Saner., MD. Schedule an appointment as soon as possible for a visit in 5 days.   Specialty:  Internal Medicine   Contact information:   [address] [place] [address] [phone]       Discharge Medications     Medication List         amoxicillin-clavulanate 875-125 MG per tablet  Commonly known as:  AUGMENTIN  Take 1 tablet by mouth 2 (two) times daily.     aspirin EC 81 MG tablet  Take 81 mg by mouth at bedtime. 30 mins prior to Niacin     diclofenac sodium 1 % Gel  Commonly known as:  VOLTAREN  Apply 1 application topically 2 (two) times daily as needed (pain).     ezetimibe 10 MG tablet  Commonly known as:  ZETIA  Take 10 mg by mouth at bedtime.     guaiFENesin 600 MG 12 hr tablet  Commonly known as:  MUCINEX  Take 1 tablet (600 mg total) by mouth 2 (two) times  daily.     lactobacillus acidophilus & bulgar chewable tablet  Chew 1 tablet by mouth 3 (three) times daily with meals.     loratadine 10 MG tablet  Commonly known as:  CLARITIN  Take 1 tablet (10 mg total) by mouth daily.     methylPREDNIsolone 4 MG tablet  Commonly known as:  MEDROL DOSPACK  follow package directions     niacin 500 MG CR tablet  Commonly known as:  NIASPAN  Take 500 mg by mouth at bedtime.     nicotine 21 mg/24hr patch  Commonly known as:  NICODERM CQ - dosed in mg/24 hours  Place 1 patch (21 mg total) onto the skin daily.     pantoprazole 40 MG tablet  Commonly known as:  PROTONIX  Take 1 tablet (40 mg total) by mouth daily.     SPIRIVA RESPIMAT 2.5 MCG/ACT Aers  Generic drug:  Tiotropium Bromide Monohydrate  Inhale 2 puffs into the lungs daily.     temazepam 15 MG  capsule  Commonly known as:  RESTORIL  Take 15 mg by mouth at bedtime.     valACYclovir 500 MG tablet  Commonly known as:  VALTREX  Take 500 mg by mouth daily.         Total Time in preparing paper work, data evaluation and todays exam - 40 minutes  Pratyush Ammon M.D on 05/26/2014 at 10:09 AM  [facility] Office  [phone]

## 2014-05-26 NOTE — Progress Notes (Signed)
Patient discharge teaching given, including activity, diet, follow-up appoints, and medications. Patient verbalized understanding of all discharge instructions. IV access was d/c'd. Vitals are stable. Skin is intact except as charted in most recent assessments. Pt to be escorted out by NT, to be driven home by family. 

## 2014-05-26 NOTE — Discharge Instructions (Signed)
Follow with Primary MD Salena Saner., MD in 5 days   Get CBC, CMP, 2 view Chest X ray checked  by Primary MD next visit.    Activity: As tolerated with Full fall precautions use walker/cane & assistance as needed   Disposition Home    Diet: Heart Healthy , with feeding assistance and aspiration precautions as needed.  For Heart failure patients - Check your Weight same time everyday, if you gain over 2 pounds, or you develop in leg swelling, experience more shortness of breath or chest pain, call your Primary MD immediately. Follow Cardiac Low Salt Diet and 1.8 lit/day fluid restriction.   On your next visit with her primary care physician please Get Medicines reviewed and adjusted.  Please request your Prim.MD to go over all Hospital Tests and Procedure/Radiological results at the follow up, please get all Hospital records sent to your Prim MD by signing hospital release before you go home.   If you experience worsening of your admission symptoms, develop shortness of breath, life threatening emergency, suicidal or homicidal thoughts you must seek medical attention immediately by calling 911 or calling your MD immediately  if symptoms less severe.  You Must read complete instructions/literature along with all the possible adverse reactions/side effects for all the Medicines you take and that have been prescribed to you. Take any new Medicines after you have completely understood and accpet all the possible adverse reactions/side effects.   Do not drive, operating heavy machinery, perform activities at heights, swimming or participation in water activities or provide baby sitting services if your were admitted for syncope or siezures until you have seen by Primary MD or a Neurologist and advised to do so again.  Do not drive when taking Pain medications.    Do not take more than prescribed Pain, Sleep and Anxiety Medications  Special Instructions: If you have smoked or chewed  Tobacco  in the last 2 yrs please stop smoking, stop any regular Alcohol  and or any Recreational drug use.  Wear Seat belts while driving.   Please note  You were cared for by a hospitalist during your hospital stay. If you have any questions about your discharge medications or the care you received while you were in the hospital after you are discharged, you can call the unit and asked to speak with the hospitalist on call if the hospitalist that took care of you is not available. Once you are discharged, your primary care physician will handle any further medical issues. Please note that NO REFILLS for any discharge medications will be authorized once you are discharged, as it is imperative that you return to your primary care physician (or establish a relationship with a primary care physician if you do not have one) for your aftercare needs so that they can reassess your need for medications and monitor your lab values.

## 2014-05-28 ENCOUNTER — Ambulatory Visit (INDEPENDENT_AMBULATORY_CARE_PROVIDER_SITE_OTHER): Payer: Commercial Managed Care - HMO | Admitting: Adult Health

## 2014-05-28 VITALS — BP 132/66 | HR 67 | Temp 98.4°F | Ht 63.0 in | Wt 115.8 lb

## 2014-05-28 DIAGNOSIS — J441 Chronic obstructive pulmonary disease with (acute) exacerbation: Secondary | ICD-10-CM

## 2014-05-28 LAB — CULTURE, BLOOD (ROUTINE X 2)
Culture: NO GROWTH
Culture: NO GROWTH

## 2014-05-28 NOTE — Patient Instructions (Signed)
Finish Augmentin and Medrol as directed.  Great job no NOT SMOKING Continue on Spiriva daily. Follow up Dr. Elsworth Soho in 4 weeks and As needed   Please contact office for sooner follow up if symptoms do not improve or worsen or seek emergency care

## 2014-05-30 NOTE — Assessment & Plan Note (Signed)
Flare with recent hospitalization -now resolving  Plan  Finish Augmentin and Medrol as directed.  Great job no NOT SMOKING Continue on Spiriva daily. Follow up Dr. Elsworth Soho in 4 weeks and As needed   Please contact office for sooner follow up if symptoms do not improve or worsen or seek emergency care

## 2014-05-30 NOTE — Progress Notes (Signed)
   Subjective:    Patient ID: Gae Gallop, female    DOB: 1941-04-29, 73 y.o.   MRN: [id number]  HPI   PCP- Willey Blade   73 year old one pack per day smoker  for FU of gold B COPD.  She started smoking as a teenager and smoked about 60 pack years. She worked in a [employer] for 20 years until she retired in 52.   spirometry 12/2012 FEV1 of 70%, the ratio 62 and FVC of 86% suggesting mild airway obstruction. However smaller airways are significantly decreased at 30% suggesting severe airway obstruction.  Chest x-ray showed hyperinflation and no infiltrates or effusions  Her voice has been deep for many years- she denies any recent changes to her voice or hoarseness  Did not tolerate symbicort - started on spiriva 01/2013   Unsure if she is getting spiriva from capsules Smokes 1/2 PPD Stays inside-winter, gets busy once warmer C/o nasal drainage all year round  05/28/14 [facility] follow up  Returns for follow up for post hospital  Admitted  9/16-9/20 for COPD flare, tx w/ abx , steroids and nebs.  CXR was neg for PNA . Discharged on Augmentin and medrol  taper .  She is feeling better but still weak..  No hemoptyiss, diarrhea, chest pain, orthopnea or edema.  Has not smoked since discharge , congratulated on this     Past Medical History  Diagnosis Date  . Hyperlipidemia   . Asthma   . COPD (chronic obstructive pulmonary disease)   . Shortness of breath   . GERD (gastroesophageal reflux disease)      Review of Systems  neg for any significant sore throat, dysphagia, itching, sneezing, nasal congestion or excess/ purulent secretions, fever, chills, sweats, unintended wt loss, pleuritic or exertional cp, hempoptysis, orthopnea pnd or change in chronic leg swelling. Also denies presyncope, palpitations, heartburn, abdominal pain, nausea, vomiting, diarrhea or change in bowel or urinary habits, dysuria,hematuria, rash, arthralgias, visual complaints, headache,  numbness weakness or ataxia.     Objective:   Physical Exam   Gen. Pleasant,thin woman, in no distress, normal affect ENT - no lesions, no post nasal drip Neck: No JVD, no thyromegaly, no carotid bruits Lungs: no use of accessory muscles, no dullness to percussion, decreased BS in bases  Cardiovascular: Rhythm regular, heart sounds  normal, no murmurs or gallops, no peripheral edema Abdomen: soft and non-tender, no hepatosplenomegaly, BS normal. Musculoskeletal: No deformities, no cyanosis or clubbing Neuro:  alert, non focal       Assessment & Plan:

## 2014-07-17 ENCOUNTER — Ambulatory Visit (INDEPENDENT_AMBULATORY_CARE_PROVIDER_SITE_OTHER): Payer: Commercial Managed Care - HMO | Admitting: Pulmonary Disease

## 2014-07-17 ENCOUNTER — Encounter: Payer: Self-pay | Admitting: Pulmonary Disease

## 2014-07-17 VITALS — BP 118/72 | HR 103 | Temp 97.2°F | Ht 63.0 in | Wt 117.8 lb

## 2014-07-17 DIAGNOSIS — J432 Centrilobular emphysema: Secondary | ICD-10-CM

## 2014-07-17 DIAGNOSIS — Z23 Encounter for immunization: Secondary | ICD-10-CM | POA: Diagnosis not present

## 2014-07-17 DIAGNOSIS — E041 Nontoxic single thyroid nodule: Secondary | ICD-10-CM

## 2014-07-17 NOTE — Progress Notes (Signed)
   Subjective:    Patient ID: [name], female    DOB: 1941/03/12, 73 y.o.   MRN: [id number]  HPI   PCP- Willey Blade   73 year old one pack per day smoker for FU of gold B COPD.  She started smoking as a teenager and smoked about 60 pack years. She worked in a [employer] for 20 years until she retired in 30.   spirometry 12/2012 FEV1 of 70%, the ratio 62 and FVC of 86% suggesting mild airway obstruction. However smaller airways are significantly decreased at 30% suggesting severe airway obstruction.  Her voice has been deep for many years- she denies any recent changes to her voice or hoarseness  Did not tolerate symbicort - started on spiriva 01/2013   07/17/2014  Chief Complaint  Patient presents with  . Follow-up    F/U COPD; no complaints   Screening CT in 11/2013 showed RUL pleural based nodule - stable in 02/2014 & again in 05/2014 Admitted for bronchitis/copd  Flare in 05/2014 - 6days  Asking about prevnar Does not want lfu shot 'Allergy' to albuterol - causes sinus drip   Review of Systems neg for any significant sore throat, dysphagia, itching, sneezing, nasal congestion or excess/ purulent secretions, fever, chills, sweats, unintended wt loss, pleuritic or exertional cp, hempoptysis, orthopnea pnd or change in chronic leg swelling. Also denies presyncope, palpitations, heartburn, abdominal pain, nausea, vomiting, diarrhea or change in bowel or urinary habits, dysuria,hematuria, rash, arthralgias, visual complaints, headache, numbness weakness or ataxia.     Objective:   Physical Exam Gen. Pleasant, thin, in no distress ENT - no lesions, no post nasal drip Neck: No JVD, no thyromegaly, no carotid bruits Lungs: no use of accessory muscles, no dullness to percussion, decreased without rales or rhonchi  Cardiovascular: Rhythm regular, heart sounds  normal, no murmurs or gallops, no peripheral edema Musculoskeletal: No deformities, no cyanosis or clubbing           Assessment & Plan:

## 2014-07-17 NOTE — Patient Instructions (Signed)
Ultrasound of thyroid for nodule Stay on spiriva Use xopenex for rescue when needed Prevnar today Take flu shot when nephew is home Repeat CT scan in 1 yr - sep 2016

## 2014-07-18 DIAGNOSIS — E042 Nontoxic multinodular goiter: Secondary | ICD-10-CM | POA: Insufficient documentation

## 2014-07-18 NOTE — Assessment & Plan Note (Signed)
Stay on spiriva Use xopenex for rescue when needed Prevnar today Take flu shot when nephew is home Repeat CT scan in 1 yr - sep 2016

## 2014-07-18 NOTE — Assessment & Plan Note (Signed)
US thyroid

## 2014-07-22 ENCOUNTER — Ambulatory Visit
Admission: RE | Admit: 2014-07-22 | Discharge: 2014-07-22 | Disposition: A | Discharge status: Home / Self Care | Payer: Commercial Managed Care - HMO | Source: Ambulatory Visit | Attending: Pulmonary Disease | Admitting: Pulmonary Disease

## 2014-07-22 DIAGNOSIS — E041 Nontoxic single thyroid nodule: Secondary | ICD-10-CM

## 2014-07-24 ENCOUNTER — Telehealth: Payer: Self-pay | Admitting: Pulmonary Disease

## 2014-07-24 ENCOUNTER — Other Ambulatory Visit: Payer: Self-pay | Admitting: Pulmonary Disease

## 2014-07-24 DIAGNOSIS — E079 Disorder of thyroid, unspecified: Secondary | ICD-10-CM

## 2014-07-25 NOTE — Telephone Encounter (Signed)
Called and spoke to pt. Pt stated she already received the results of the soft tissue scan. Nothing further needed.    Notes Recorded by Glean Hess, CMA on 07/24/2014 at 2:16 PM Patient notified. Referral placed to Dr. Cruzita Lederer. Nothing further needed.

## 2014-08-15 ENCOUNTER — Encounter: Payer: Self-pay | Admitting: Internal Medicine

## 2014-08-15 ENCOUNTER — Ambulatory Visit (INDEPENDENT_AMBULATORY_CARE_PROVIDER_SITE_OTHER): Payer: Commercial Managed Care - HMO | Admitting: Internal Medicine

## 2014-08-15 VITALS — BP 118/72 | HR 81 | Temp 98.0°F | Resp 14 | Ht 63.5 in | Wt 119.0 lb

## 2014-08-15 DIAGNOSIS — E042 Nontoxic multinodular goiter: Secondary | ICD-10-CM

## 2014-08-15 NOTE — Patient Instructions (Signed)
You will be called with the scheduling time and date for the thyroid biopsy (In [facility] downstairs) Please return in 1 year.  Thyroid Biopsy The thyroid gland is a butterfly-shaped gland situated in the front of the neck. It produces hormones which affect metabolism, growth and development, and body temperature. A thyroid biopsy is a procedure in which small samples of tissue or fluid are removed from the thyroid gland or mass and examined under a microscope. This test is done to determine the cause of thyroid problems, such as infection, cancer, or other thyroid problems. There are 2 ways to obtain samples: 1. Fine needle biopsy. Samples are removed using a thin needle inserted through the skin and into the thyroid gland or mass. 2. Open biopsy. Samples are removed after a cut (incision) is made through the skin. LET YOUR CAREGIVER KNOW ABOUT:   Allergies.  Medications taken including herbs, eye drops, over-the-counter medications, and creams.  Use of steroids (by mouth or creams).  Previous problems with anesthetics or numbing medicine.  Possibility of pregnancy, if this applies.  History of blood clots (thrombophlebitis).  History of bleeding or blood problems.  Previous surgery.  Other health problems. RISKS AND COMPLICATIONS  Bleeding from the site. The risk of bleeding is higher if you have a bleeding disorder or are taking any blood thinning medications (anticoagulants).  Infection.  Injury to structures near the thyroid gland. BEFORE THE PROCEDURE  This is a procedure that can be done as an outpatient. Confirm the time that you need to arrive for your procedure. Confirm whether there is a need to fast or withhold any medications. A blood sample may be done to determine your blood clotting time. Medicine may be given to help you relax (sedative). PROCEDURE Fine needle biopsy. You will be awake during the procedure. You may be asked to lie on your back with your  head tipped backward to extend your neck. Let your caregiver know if you cannot tolerate the positioning. An area on your neck will be cleansed. A needle is inserted through the skin of your neck. You may feel a mild discomfort during this procedure. You may be asked to avoid coughing, talking, swallowing, or making sounds during some portions of the procedure. The needle is withdrawn once tissue or fluid samples have been removed. Pressure may be applied to the neck to reduce swelling and ensure that bleeding has stopped. The samples will be sent for examination.  Open biopsy. You will be given general anesthesia. You will be asleep during the procedure. An incision is made in your neck. A sample of thyroid tissue or the mass is removed. The tissue sample or mass will be sent for examination. The sample or mass may be examined during the biopsy. If the sample or mass contains cancer cells, some or all of the thyroid gland may be removed. The incision is closed with stitches. AFTER THE PROCEDURE  Your recovery will be assessed and monitored. If there are no problems, as an outpatient, you should be able to go home shortly after the procedure. If you had a fine needle biopsy:  You may have soreness at the biopsy site for 1 to 2 days. If you had an open biopsy:   You may have soreness at the biopsy site for 3 to 4 days.  You may have a hoarse voice or sore throat for 1 to 2 days. Obtaining the Test Results It is your responsibility to obtain your test results. Do not  assume everything is normal if you have not heard from your caregiver or the medical facility. It is important for you to follow up on all of your test results. HOME CARE INSTRUCTIONS   Keeping your head raised on a pillow when you are lying down may ease biopsy site discomfort.  Supporting the back of your head and neck with both hands as you sit up from a lying position may ease biopsy site discomfort.  Only take over-the-counter or  prescription medicines for pain, discomfort, or fever as directed by your caregiver.  Throat lozenges or gargling with warm salt water may help to soothe a sore throat. SEEK IMMEDIATE MEDICAL CARE IF:   You have severe bleeding from the biopsy site.  You have difficulty swallowing.  You have a fever.  You have increased pain, swelling, redness, or warmth at the biopsy site.  You notice pus coming from the biopsy site.  You have swollen glands (lymph nodes) in your neck. Document Released: 06/20/2007 Document Revised: 12/18/2012 Document Reviewed: 11/15/2013 [facility] Patient Information 2015 [place], [place]. This information is not intended to replace advice given to you by your health care provider. Make sure you discuss any questions you have with your health care provider.

## 2014-08-15 NOTE — Progress Notes (Addendum)
Patient ID: [name], female   DOB: Oct 07, 1940, 73 y.o.   MRN: [id number]   HPI  [name] is a 73 y.o.-year-old female, referred by Dr. Elsworth Soho, for evaluation for MNG.  She was admitted in 06/2014 >> a CT chest was checked >> thyroid nodules >> sent for a thyroid U/S.  Thyroid U/S in 07/22/2014: 4 nodules in the R lobe and 3 in the L lobe  - largest in R lobe: 3.2 x 1.8 x 2.1 cm  Pt is feeling "lumps" in her neck, she has chronic hoarseness (smoker), no dysphagia/odynophagia, SOB with lying down.  I reviewed pt's thyroid tests: Lab Results  Component Value Date   TSH 1.840 05/25/2014   FREET4 1.04 05/25/2014    Pt c/o: - + both heat intolerance/cold intolerance - hot flushes - sometimes tremors - no palpitations - no anxiety/depression - no hyperdefecation/+ occasionalconstipation - + weight loss and gain - + dry skin - + hair loss - no fatigue  Pt does have a FH of thyroid ds: daughter with hypothyroidism. No FH of thyroid cancer. No h/o radiation tx to head or neck.  No seaweed or kelp, no recent contrast studies. + recent steroid use: Prednisone taper in 06/2014 for URI. No herbal supplements.   I reviewed her chart and she also has a history of HL, GERD, asthma.  ROS: Constitutional: + see HPi Eyes: no blurry vision, no xerophthalmia ENT: + sore throat, + see HPI, + tinnitus Cardiovascular: no CP/+ SOB/no palpitations/leg swelling Respiratory: + cough/+ SOB/+ wheezing Gastrointestinal: no N/V/D/+ occasional C/+ occasional acid reflux Musculoskeletal: + both: muscle/joint aches Skin: no rashes, + hair loss, + easy bruising Neurological: no tremors/numbness/tingling/dizziness Psychiatric: no depression/anxiety + low libido  Past Medical History  Diagnosis Date  . Hyperlipidemia   . Asthma   . COPD (chronic obstructive pulmonary disease)   . Shortness of breath   . GERD (gastroesophageal reflux disease)    Past Surgical History  Procedure  Laterality Date  . Cesarean section    . Cataract extraction      left eye   History   Social History  . Marital Status: Widowed    Spouse Name: N/A    Number of Children: 5   Occupational History  . retired    Social History Main Topics  . Smoking status: Current Every Day Smoker -- now 3-4 cigs a day 0.50 packs/day for 54 years    Types: Cigarettes  . Smokeless tobacco: Never Used  . Alcohol Use: No  . Drug Use: No   Current Outpatient Prescriptions on File Prior to Visit  Medication Sig Dispense Refill  . aspirin EC 81 MG tablet Take 81 mg by mouth at bedtime. 30 mins prior to Niacin    . ezetimibe (ZETIA) 10 MG tablet Take 10 mg by mouth at bedtime.     . niacin (NIASPAN) 500 MG CR tablet Take 500 mg by mouth at bedtime.     . pantoprazole (PROTONIX) 40 MG tablet Take 1 tablet (40 mg total) by mouth daily. 30 tablet 0  . Tiotropium Bromide Monohydrate (SPIRIVA RESPIMAT) 2.5 MCG/ACT AERS Inhale 2 puffs into the lungs daily.    . valACYclovir (VALTREX) 500 MG tablet Take 500 mg by mouth daily.    Penne Lash HFA 45 MCG/ACT inhaler   1  . temazepam (RESTORIL) 15 MG capsule Take 15 mg by mouth at bedtime.      No current facility-administered medications on file prior to  visit.   Allergies  Allergen Reactions  . Albuterol Shortness Of Breath   Family History  Problem Relation Age of Onset  . Renal cancer Brother   . Diabetes Mother   . Hypertension Mother   . Renal Disease Mother    PE: BP 118/72 mmHg  Pulse 81  Temp(Src) 98 F (36.7 C) (Oral)  Resp 14  Ht 5' 3.5" (1.613 m)  Wt 119 lb (53.978 kg)  BMI 20.75 kg/m2  SpO2 94% Wt Readings from Last 3 Encounters:  08/15/14 119 lb (53.978 kg)  07/17/14 117 lb 12.8 oz (53.434 kg)  05/28/14 115 lb 12.8 oz (52.527 kg)    Constitutional: normal weight, in NAD Eyes: PERRLA, EOMI, no exophthalmos ENT: moist mucous membranes, + thyromegaly - lumpy bumpy thyroid, no cervical lymphadenopathy Cardiovascular: RRR, No  MRG Respiratory: CTA B Gastrointestinal: abdomen soft, NT, ND, BS+ Musculoskeletal: no deformities, strength intact in all 4;  Skin: moist, warm, no rashes Neurological: no tremor with outstretched hands, DTR normal in all 4  ASSESSMENT: 1. MNG - thyroid U/S (07/22/2014):  Right thyroid lobe  Measurements: 5.4 x 2.7 x 2.2 cm, previously measured 4.9 x 2.5 x 2.0 cm. Nodule along the anterior right thyroid lobe measures 1.1 x 0.4 x 0.9 cm and previously measured 1.0 x 0.5 x 0.9 cm. There is an isoechoic nodule in the anterior right thyroid lobe that measures 1.0 x 0.6 x 0.8 cm and unclear what this measured on the previous examination. Dominant nodule in the inferior right thyroid lobe measures 3.2 x 1.8 x 2.1 cm. This dominant nodule is heterogeneous and previously measured 2.4 x 2.1 x 2.0 cm. Nodule in the inferior right thyroid lobe measures 1.1 x 1.1 x 0.8 cm and previously measured 0.7 x 0.8 x 0.8 cm.  Left thyroid lobe  Measurements: 4.9 x 2.2 x 2.1 cm, previously measured 4.3 x 1.8 x 1.9 cm. Heterogeneous nodule in the mid left thyroid lobe measures 1.4 x 0.9 x 0.9 cm and previously measured 0.8 x 0.8 x 0.7 cm. There is a solid isoechoic nodule in the inferior left thyroid lobe that measures 1.3 x 0.9 x 0.8 cm and this was not clearly identified or measured on the previous examination. There is another small nodule in the left thyroid lobe that measures up to 0.6 cm.  Isthmus  Thickness: 0.3 cm. No nodules visualized.  Lymphadenopathy  None visualized.  IMPRESSION: Multi nodular goiter. Interval enlargement of the thyroid tissue and enlargement of the dominant thyroid nodules. Question new nodules.    PLAN: 1. MNG  - I reviewed the images of her thyroid ultrasound along with the patient. I pointed out that the dominant nodule is large, this being a risk factor for cancer. The rest of the nodules are <1.5 cm, without calcifications, without internal  blood flow, more wide than tall. Pt does not have a thyroid cancer family history or a personal history of RxTx to head/neck. All these would favor benignity.  - the only way that we can tell exactly if it is cancer or not is by doing a thyroid biopsy (FNA). I explained what the test entails. - We discussed about other options, to wait for another 6 months to a year and see if the nodule grows, and only intervene at that time.  - I explained that this is not cancer, we can continue to follow her on a yearly basis, and check another ultrasound in another year or 2. - she should let me  know if she develops neck compression symptoms, in that case, we might need to do either lobectomy or thyroidectomy - I did explain that, while thyroid surgery is not a complicated one, it still can have side effects and also she might have a risk of ~25% of becoming hypothyroid after hemithyroidectomy.  - patient decided to have the FNA done now >> I ordered this - will Bx the largest nodule - I'll see her back in a year, assuming her FNA is normal. If FNA abnormal, we will meet sooner.  - I advised pt to join my chart and I will send her the results through there   Orders Placed This Encounter  Procedures  . US Thyroid Biopsy   I will addend the results when they become available.  Adequacy Reason Satisfactory For Evaluation. Diagnosis THYROID, FINE NEEDLE ASPIRATION, RIGHT INFERIOR (SPECIMEN 1 OF 1 COLLECTED 08-20-2014 BENIGN. CONSISTENT WITH BENIGN FOLLICULAR NODULE (BETHESDA CATEGORY II). [place]i RUND DO Pathologist, Electronic Signature (Case signed 08/21/2014) Specimen Clinical Information Dominant nodule in the inferior right thyroid lobe measures 3.2 x 1.8 x 2.1 cm Source Thyroid, Fine Needle Aspiration, Right Inferior, (Specimen 1 of 1, collected on 08/20/14 )  Thyroid nodule is benign.

## 2014-08-17 ENCOUNTER — Telehealth: Payer: Self-pay | Admitting: Pulmonary Disease

## 2014-08-19 NOTE — Telephone Encounter (Signed)
Pt is calling for sprivia respimat called to [facility] [place].[name]

## 2014-08-19 NOTE — Telephone Encounter (Signed)
Called and spoke to pt. Pt requesting spiriva respimat refill. Rx sent to preferred pharmacy. Pt verbalized understanding and denied any further questions or concerns at this time.

## 2014-08-20 ENCOUNTER — Ambulatory Visit
Admission: RE | Admit: 2014-08-20 | Discharge: 2014-08-20 | Disposition: A | Discharge status: Home / Self Care | Payer: Commercial Managed Care - HMO | Source: Ambulatory Visit | Attending: Internal Medicine | Admitting: Internal Medicine

## 2014-08-20 ENCOUNTER — Other Ambulatory Visit (HOSPITAL_COMMUNITY)
Admission: RE | Admit: 2014-08-20 | Discharge: 2014-08-20 | Disposition: A | Discharge status: Home / Self Care | Payer: Commercial Managed Care - HMO | Source: Ambulatory Visit | Attending: Interventional Radiology | Admitting: Interventional Radiology

## 2014-08-20 DIAGNOSIS — E042 Nontoxic multinodular goiter: Secondary | ICD-10-CM | POA: Insufficient documentation

## 2014-08-22 NOTE — Addendum Note (Signed)
Addended by: Philemon Kingdom on: 08/22/2014 12:25 PM   Modules accepted: Level of Service

## 2014-09-02 ENCOUNTER — Encounter: Payer: Self-pay | Admitting: *Deleted

## 2014-09-03 ENCOUNTER — Encounter: Payer: Self-pay | Admitting: Obstetrics & Gynecology

## 2014-09-25 DIAGNOSIS — K219 Gastro-esophageal reflux disease without esophagitis: Secondary | ICD-10-CM | POA: Diagnosis not present

## 2014-09-25 DIAGNOSIS — R490 Dysphonia: Secondary | ICD-10-CM | POA: Diagnosis not present

## 2014-09-25 DIAGNOSIS — R1314 Dysphagia, pharyngoesophageal phase: Secondary | ICD-10-CM | POA: Diagnosis not present

## 2014-09-26 ENCOUNTER — Other Ambulatory Visit: Payer: Self-pay | Admitting: Otolaryngology

## 2014-09-26 DIAGNOSIS — R1314 Dysphagia, pharyngoesophageal phase: Secondary | ICD-10-CM

## 2014-09-26 DIAGNOSIS — R490 Dysphonia: Secondary | ICD-10-CM

## 2014-10-03 ENCOUNTER — Ambulatory Visit
Admission: RE | Admit: 2014-10-03 | Discharge: 2014-10-03 | Disposition: A | Discharge status: Home / Self Care | Payer: Commercial Managed Care - HMO | Source: Ambulatory Visit | Attending: Otolaryngology | Admitting: Otolaryngology

## 2014-10-03 DIAGNOSIS — E041 Nontoxic single thyroid nodule: Secondary | ICD-10-CM | POA: Diagnosis not present

## 2014-10-03 DIAGNOSIS — R1314 Dysphagia, pharyngoesophageal phase: Secondary | ICD-10-CM | POA: Diagnosis not present

## 2014-10-03 DIAGNOSIS — E042 Nontoxic multinodular goiter: Secondary | ICD-10-CM | POA: Diagnosis not present

## 2014-10-03 DIAGNOSIS — R490 Dysphonia: Secondary | ICD-10-CM

## 2014-10-03 MED ORDER — IOHEXOL 300 MG/ML  SOLN: 75.0000 mL | Freq: Once | INTRAMUSCULAR | Status: AC | PRN

## 2014-10-17 ENCOUNTER — Encounter: Payer: Self-pay | Admitting: Adult Health

## 2014-10-17 ENCOUNTER — Ambulatory Visit (INDEPENDENT_AMBULATORY_CARE_PROVIDER_SITE_OTHER): Payer: Commercial Managed Care - HMO | Admitting: Adult Health

## 2014-10-17 VITALS — BP 144/84 | HR 70 | Temp 98.2°F | Ht 63.0 in | Wt 126.6 lb

## 2014-10-17 DIAGNOSIS — R911 Solitary pulmonary nodule: Secondary | ICD-10-CM | POA: Diagnosis not present

## 2014-10-17 DIAGNOSIS — J432 Centrilobular emphysema: Secondary | ICD-10-CM

## 2014-10-17 NOTE — Progress Notes (Signed)
   Subjective:    Patient ID: Gae Gallop, female    DOB: 24-Apr-1941, 74 y.o.   MRN: [id number]  HPI   PCP- Willey Blade   74 year old one pack per day smoker for FU of gold B COPD.  She started smoking as a teenager and smoked about 60 pack years. She worked in a [employer] for 20 years until she retired in 44.   spirometry 12/2012 FEV1 of 70%, the ratio 62 and FVC of 86% suggesting mild airway obstruction. However smaller airways are significantly decreased at 30% suggesting severe airway obstruction.  Her voice has been deep for many years- she denies any recent changes to her voice or hoarseness  Did not tolerate symbicort - started on spiriva 01/2013   07/17/2014  Chief Complaint  Patient presents with  . Follow-up    F/U COPD; no complaints   Screening CT in 11/2013 showed RUL pleural based nodule - stable in 02/2014 & again in 05/2014 Admitted for bronchitis/copd  Flare in 05/2014 - 6days  Asking about prevnar Does not want lfu shot 'Allergy' to albuterol - causes sinus drip  10/17/2014 Follow up : COPD , smoker  Patient returns for a three-month follow-up. She remains on Spiriva daily. Unfortunately, she continues to smoke. We discussed smoking cessation Patient was recently seen by ENT for chronic hoarseness. Notes sent by Dr. Wilburn Cornelia, patient underwent a flexible laryngoscopy that was negative for tumor. Normal-appearing vocal cord movement. She had moderate post glottic erythema consistent with reflux. She was recommended on a reflux regimen and smoking cessation. PVX and Prevnar utd.  She denies any chest pain, orthopnea, PND, leg swelling or hemoptysis Patient has a 11 mm nodule in the left upper lobe that has been stable on serial CTs she is due for  CT and September 2016    Review of Systems neg for any significant sore throat, dysphagia, itching, sneezing, nasal congestion or excess/ purulent secretions, fever, chills, sweats, unintended wt loss,  pleuritic or exertional cp, hempoptysis, orthopnea pnd or change in chronic leg swelling. Also denies presyncope, palpitations, heartburn, abdominal pain, nausea, vomiting, diarrhea or change in bowel or urinary habits, dysuria,hematuria, rash, arthralgias, visual complaints, headache, numbness weakness or ataxia.     Objective:   Physical Exam Gen. Pleasant, thin, in no distress, hoarse deep voice  ENT - no lesions, no post nasal drip Neck: No JVD, no thyromegaly, no carotid bruits Lungs: no use of accessory muscles, no dullness to percussion, decreased without rales or rhonchi  Cardiovascular: Rhythm regular, heart sounds  normal, no murmurs or gallops, no peripheral edema Musculoskeletal: No deformities, no cyanosis or clubbing         Assessment & Plan:

## 2014-10-17 NOTE — Patient Instructions (Signed)
Continue on Spiriva daily. Continue to work on not smoking .  We will follow CT chest in Sept 2016 for lung nodule . Follow up Dr. Elsworth Soho in 4 months and As needed   Please contact office for sooner follow up if symptoms do not improve or worsen or seek emergency care

## 2014-10-17 NOTE — Assessment & Plan Note (Signed)
Compensated on present regimen without exacerbation and possible smoking cessation   Plan Continue on Spiriva daily. Continue to work on not smoking .  We will follow CT chest in Sept 2016 for lung nodule . Follow up Dr. Elsworth Soho in 4 months and As needed   Please contact office for sooner follow up if symptoms do not improve or worsen or seek emergency care

## 2014-10-17 NOTE — Assessment & Plan Note (Addendum)
11 mm nodule in the left upper lobe that has been stable on serial CTs she is due for a CT and September 2016.  Plan   Repeat CT chest. 2016

## 2014-10-17 NOTE — Addendum Note (Signed)
Addended by: Parke Poisson E on: 10/17/2014 05:08 PM   Modules accepted: Orders

## 2014-10-23 DIAGNOSIS — K219 Gastro-esophageal reflux disease without esophagitis: Secondary | ICD-10-CM | POA: Diagnosis not present

## 2014-10-23 DIAGNOSIS — R490 Dysphonia: Secondary | ICD-10-CM | POA: Diagnosis not present

## 2014-11-07 DIAGNOSIS — N390 Urinary tract infection, site not specified: Secondary | ICD-10-CM | POA: Diagnosis not present

## 2014-11-07 DIAGNOSIS — Z5181 Encounter for therapeutic drug level monitoring: Secondary | ICD-10-CM | POA: Diagnosis not present

## 2014-11-07 DIAGNOSIS — Z79899 Other long term (current) drug therapy: Secondary | ICD-10-CM | POA: Diagnosis not present

## 2014-11-07 DIAGNOSIS — R7309 Other abnormal glucose: Secondary | ICD-10-CM | POA: Diagnosis not present

## 2014-11-07 DIAGNOSIS — J449 Chronic obstructive pulmonary disease, unspecified: Secondary | ICD-10-CM | POA: Diagnosis not present

## 2014-11-07 DIAGNOSIS — R5383 Other fatigue: Secondary | ICD-10-CM | POA: Diagnosis not present

## 2014-11-07 DIAGNOSIS — Z0000 Encounter for general adult medical examination without abnormal findings: Secondary | ICD-10-CM | POA: Diagnosis not present

## 2014-11-07 DIAGNOSIS — I10 Essential (primary) hypertension: Secondary | ICD-10-CM | POA: Diagnosis not present

## 2014-11-07 DIAGNOSIS — R1310 Dysphagia, unspecified: Secondary | ICD-10-CM | POA: Diagnosis not present

## 2014-11-07 DIAGNOSIS — E782 Mixed hyperlipidemia: Secondary | ICD-10-CM | POA: Diagnosis not present

## 2014-11-22 DIAGNOSIS — H26491 Other secondary cataract, right eye: Secondary | ICD-10-CM | POA: Diagnosis not present

## 2014-11-22 DIAGNOSIS — H524 Presbyopia: Secondary | ICD-10-CM | POA: Diagnosis not present

## 2014-11-22 DIAGNOSIS — H04123 Dry eye syndrome of bilateral lacrimal glands: Secondary | ICD-10-CM | POA: Diagnosis not present

## 2014-11-22 DIAGNOSIS — H26492 Other secondary cataract, left eye: Secondary | ICD-10-CM | POA: Diagnosis not present

## 2014-11-25 DIAGNOSIS — N390 Urinary tract infection, site not specified: Secondary | ICD-10-CM | POA: Diagnosis not present

## 2014-12-02 DIAGNOSIS — J381 Polyp of vocal cord and larynx: Secondary | ICD-10-CM | POA: Diagnosis not present

## 2014-12-02 DIAGNOSIS — K219 Gastro-esophageal reflux disease without esophagitis: Secondary | ICD-10-CM | POA: Diagnosis not present

## 2014-12-02 DIAGNOSIS — Z720 Tobacco use: Secondary | ICD-10-CM | POA: Diagnosis not present

## 2014-12-02 DIAGNOSIS — R490 Dysphonia: Secondary | ICD-10-CM | POA: Diagnosis not present

## 2014-12-05 ENCOUNTER — Encounter (HOSPITAL_COMMUNITY): Payer: Self-pay | Admitting: Pharmacy Technician

## 2014-12-06 ENCOUNTER — Other Ambulatory Visit: Payer: Self-pay | Admitting: Otolaryngology

## 2014-12-09 DIAGNOSIS — N390 Urinary tract infection, site not specified: Secondary | ICD-10-CM | POA: Diagnosis not present

## 2014-12-10 ENCOUNTER — Encounter (HOSPITAL_COMMUNITY)
Admission: RE | Admit: 2014-12-10 | Discharge: 2014-12-10 | Disposition: A | Discharge status: Home / Self Care | Payer: Commercial Managed Care - HMO | Source: Ambulatory Visit | Attending: Otolaryngology | Admitting: Otolaryngology

## 2014-12-10 DIAGNOSIS — J45909 Unspecified asthma, uncomplicated: Secondary | ICD-10-CM | POA: Diagnosis not present

## 2014-12-10 DIAGNOSIS — E785 Hyperlipidemia, unspecified: Secondary | ICD-10-CM | POA: Diagnosis not present

## 2014-12-10 DIAGNOSIS — K219 Gastro-esophageal reflux disease without esophagitis: Secondary | ICD-10-CM | POA: Diagnosis not present

## 2014-12-10 DIAGNOSIS — R490 Dysphonia: Secondary | ICD-10-CM | POA: Diagnosis present

## 2014-12-10 DIAGNOSIS — J449 Chronic obstructive pulmonary disease, unspecified: Secondary | ICD-10-CM | POA: Diagnosis not present

## 2014-12-10 DIAGNOSIS — Z7982 Long term (current) use of aspirin: Secondary | ICD-10-CM | POA: Diagnosis not present

## 2014-12-10 DIAGNOSIS — J383 Other diseases of vocal cords: Secondary | ICD-10-CM | POA: Diagnosis not present

## 2014-12-10 DIAGNOSIS — F17210 Nicotine dependence, cigarettes, uncomplicated: Secondary | ICD-10-CM | POA: Diagnosis not present

## 2014-12-10 DIAGNOSIS — R491 Aphonia: Secondary | ICD-10-CM | POA: Diagnosis not present

## 2014-12-10 LAB — BASIC METABOLIC PANEL
ANION GAP: 8 (ref 5–15)
BUN: 15 mg/dL (ref 6–23)
CALCIUM: 9.6 mg/dL (ref 8.4–10.5)
CO2: 27 mmol/L (ref 19–32)
Chloride: 108 mmol/L (ref 96–112)
Creatinine, Ser: 1.12 mg/dL — ABNORMAL HIGH (ref 0.50–1.10)
GFR calc Af Amer: 55 mL/min — ABNORMAL LOW (ref >90)
GFR, EST NON AFRICAN AMERICAN: 48 mL/min — AB (ref >90)
Glucose, Bld: 84 mg/dL (ref 70–99)
POTASSIUM: 4.2 mmol/L (ref 3.5–5.1)
SODIUM: 143 mmol/L (ref 135–145)

## 2014-12-10 LAB — CBC
HCT: 42.6 % (ref 36.0–46.0)
Hemoglobin: 14.6 g/dL (ref 12.0–15.0)
MCH: 33.3 pg (ref 26.0–34.0)
MCHC: 34.3 g/dL (ref 30.0–36.0)
MCV: 97 fL (ref 78.0–100.0)
PLATELETS: 209 10*3/uL (ref 150–400)
RBC: 4.39 MIL/uL (ref 3.87–5.11)
RDW: 14.7 % (ref 11.5–15.5)
WBC: 7 10*3/uL (ref 4.0–10.5)

## 2014-12-10 NOTE — Progress Notes (Signed)
Denies having seen a cardiologist PCP Bryon Lions No acute distress, denies chest pain

## 2014-12-10 NOTE — Pre-Procedure Instructions (Signed)
[name]  12/10/2014   Your procedure is scheduled on:  December 13, 2014  Report to [facility] Admitting at 5:30 AM.  Call this number if you have problems the morning of surgery: [phone]   Remember:   Do not eat food or drink liquids after midnight.   Take these medicines the morning of surgery with A SIP OF WATER: pantoprazole (PROTONIX), SPIRIVA RESPIMAT 2.5 MCG/ACT AERS, valACYclovir (VALTREX),  XOPENEX HFA 45 MCG/ACT inhaler- bring with you if needed   Do not wear jewelry, make-up or nail polish.  Do not wear lotions, powders, or perfumes. You may wear deodorant.  Do not shave 48 hours prior to surgery.   Do not bring valuables to the hospital.  [facility] is not responsible  for any belongings or valuables.               Contacts, dentures or bridgework may not be worn into surgery.  Leave suitcase in the car. After surgery it may be brought to your room.  For patients admitted to the hospital, discharge time is determined by your  treatment team.               Patients discharged the day of surgery will not be allowed to drive home.  Name and phone number of your driver: family/friend  Special Instructions: "preparing for surgery"   Please read over the following fact sheets that you were given: Pain Booklet, Coughing and Deep Breathing and Surgical Site Infection Prevention

## 2014-12-11 NOTE — Progress Notes (Signed)
Anesthesia Chart Review: Patient is a 74 year old female scheduled for microlaryngoscopy and excison of vocal cord polyps on 12/13/14 by Dr. Wilburn Cornelia. DX: vocal cord polyps.  History includes gold B COPD, smoking, asthma, SOB, GERD, HLD. She does not require supplemental home O2. She denied chest pain or new SOB at her PAT RN visit. She is followed by endocrinologist Dr. Cruzita Lederer for mult-nodular goiter. Pulmonologist is Dr. Elsworth Soho. PCP is Dr. Glendale Chard.  Meds include ASA, Zeta, Remeron, Niacin, Protonix, Spiriva, Valtrex, Xopenex.  05/22/14 EKG: NSR, biatrial enlargement.  05/25/15 CXR: Stable chest. No active cardiopulmonary process.  According to pulmonology notes, "spirometry 12/2012 FEV1 of 70%, the ratio 62 and FVC of 86% suggesting mild airway obstruction. However smaller airways are significantly decreased at 30% suggesting severe airway obstruction."   CT of the neck 10/03/14: IMPRESSION: Symmetric prominence of the torus tubarius, tonsils and aryepiglottic fold bilaterally. No focal mass lesion. Question chronic inflammation from smoking/pharyngitis. Negative for mass or adenopathy. Apical emphysema. Multinodular goiter.  Preoperative labs noted.   If no acute changes then I would anticipate that she could proceed as planned.  George Hugh [facility] Short Stay Center/Anesthesiology Phone [phone] 12/11/2014 10:39 AM

## 2014-12-12 DIAGNOSIS — R498 Other voice and resonance disorders: Secondary | ICD-10-CM | POA: Diagnosis not present

## 2014-12-12 DIAGNOSIS — J381 Polyp of vocal cord and larynx: Secondary | ICD-10-CM | POA: Diagnosis not present

## 2014-12-12 MED ORDER — DEXAMETHASONE SODIUM PHOSPHATE 10 MG/ML IJ SOLN
10.0000 mg | Freq: Once | INTRAMUSCULAR | Status: AC
  Administered 2014-12-13: 10 mg via INTRAVENOUS
  Filled 2014-12-12: qty 1

## 2014-12-12 MED ORDER — CEFAZOLIN SODIUM-DEXTROSE 2-3 GM-% IV SOLR
2.0000 g | INTRAVENOUS | Status: AC
  Administered 2014-12-13: 2 g via INTRAVENOUS
  Filled 2014-12-12: qty 50

## 2014-12-13 ENCOUNTER — Ambulatory Visit (HOSPITAL_COMMUNITY)
Admission: RE | Admit: 2014-12-13 | Discharge: 2014-12-13 | Disposition: A | Discharge status: Home / Self Care | Payer: Commercial Managed Care - HMO | Source: Ambulatory Visit | Attending: Otolaryngology | Admitting: Otolaryngology

## 2014-12-13 ENCOUNTER — Ambulatory Visit (HOSPITAL_COMMUNITY): Payer: Commercial Managed Care - HMO | Admitting: Vascular Surgery

## 2014-12-13 ENCOUNTER — Encounter (HOSPITAL_COMMUNITY): Payer: Self-pay | Admitting: *Deleted

## 2014-12-13 ENCOUNTER — Ambulatory Visit (HOSPITAL_COMMUNITY): Payer: Commercial Managed Care - HMO | Admitting: Certified Registered Nurse Anesthetist

## 2014-12-13 ENCOUNTER — Encounter (HOSPITAL_COMMUNITY)
Admission: RE | Disposition: A | Discharge status: Home / Self Care | Payer: Self-pay | Source: Ambulatory Visit | Attending: Otolaryngology

## 2014-12-13 DIAGNOSIS — F17210 Nicotine dependence, cigarettes, uncomplicated: Secondary | ICD-10-CM | POA: Diagnosis not present

## 2014-12-13 DIAGNOSIS — J383 Other diseases of vocal cords: Secondary | ICD-10-CM | POA: Diagnosis not present

## 2014-12-13 DIAGNOSIS — J449 Chronic obstructive pulmonary disease, unspecified: Secondary | ICD-10-CM | POA: Insufficient documentation

## 2014-12-13 DIAGNOSIS — J381 Polyp of vocal cord and larynx: Secondary | ICD-10-CM | POA: Diagnosis not present

## 2014-12-13 DIAGNOSIS — K219 Gastro-esophageal reflux disease without esophagitis: Secondary | ICD-10-CM | POA: Diagnosis not present

## 2014-12-13 DIAGNOSIS — J45909 Unspecified asthma, uncomplicated: Secondary | ICD-10-CM | POA: Insufficient documentation

## 2014-12-13 DIAGNOSIS — J384 Edema of larynx: Secondary | ICD-10-CM

## 2014-12-13 DIAGNOSIS — Z7982 Long term (current) use of aspirin: Secondary | ICD-10-CM | POA: Insufficient documentation

## 2014-12-13 DIAGNOSIS — E785 Hyperlipidemia, unspecified: Secondary | ICD-10-CM | POA: Diagnosis not present

## 2014-12-13 DIAGNOSIS — R491 Aphonia: Secondary | ICD-10-CM | POA: Insufficient documentation

## 2014-12-13 DIAGNOSIS — R0602 Shortness of breath: Secondary | ICD-10-CM | POA: Diagnosis not present

## 2014-12-13 HISTORY — PX: MICROLARYNGOSCOPY: SHX5208

## 2014-12-13 HISTORY — PX: MASS EXCISION: SHX2000

## 2014-12-13 SURGERY — MICROLARYNGOSCOPY
Anesthesia: General | Site: Throat

## 2014-12-13 MED ORDER — 0.9 % SODIUM CHLORIDE (POUR BTL) OPTIME
TOPICAL | Status: DC | PRN
  Administered 2014-12-13: 1000 mL

## 2014-12-13 MED ORDER — SUCCINYLCHOLINE CHLORIDE 20 MG/ML IJ SOLN
INTRAMUSCULAR | Status: DC | PRN
  Administered 2014-12-13: 80 mg via INTRAVENOUS

## 2014-12-13 MED ORDER — PROMETHAZINE HCL 25 MG/ML IJ SOLN: 6.2500 mg | INTRAMUSCULAR | Status: DC | PRN

## 2014-12-13 MED ORDER — ONDANSETRON HCL 4 MG/2ML IJ SOLN
INTRAMUSCULAR | Status: DC | PRN
  Administered 2014-12-13: 4 mg via INTRAVENOUS

## 2014-12-13 MED ORDER — FENTANYL CITRATE 0.05 MG/ML IJ SOLN
INTRAMUSCULAR | Status: DC | PRN
  Administered 2014-12-13: 100 ug via INTRAVENOUS
  Administered 2014-12-13: 50 ug via INTRAVENOUS

## 2014-12-13 MED ORDER — LACTATED RINGERS IV SOLN
INTRAVENOUS | Status: DC | PRN
  Administered 2014-12-13 (×2): via INTRAVENOUS

## 2014-12-13 MED ORDER — LIDOCAINE HCL (CARDIAC) 20 MG/ML IV SOLN
INTRAVENOUS | Status: DC | PRN
  Administered 2014-12-13: 50 mg via INTRAVENOUS

## 2014-12-13 MED ORDER — HYDROMORPHONE HCL 1 MG/ML IJ SOLN: 0.2500 mg | INTRAMUSCULAR | Status: DC | PRN

## 2014-12-13 MED ORDER — EPINEPHRINE HCL (NASAL) 0.1 % NA SOLN
NASAL | Status: DC | PRN
  Administered 2014-12-13: 30 mL via TOPICAL

## 2014-12-13 MED ORDER — EPINEPHRINE HCL (NASAL) 0.1 % NA SOLN
NASAL | Status: AC
  Filled 2014-12-13: qty 30

## 2014-12-13 MED ORDER — PROPOFOL 10 MG/ML IV BOLUS
INTRAVENOUS | Status: DC | PRN
  Administered 2014-12-13: 30 mg via INTRAVENOUS
  Administered 2014-12-13: 120 mg via INTRAVENOUS

## 2014-12-13 MED ORDER — OXYCODONE HCL 5 MG PO TABS: 5.0000 mg | ORAL_TABLET | Freq: Once | ORAL | Status: DC | PRN

## 2014-12-13 MED ORDER — PROPOFOL 10 MG/ML IV BOLUS
INTRAVENOUS | Status: AC
  Filled 2014-12-13: qty 20

## 2014-12-13 MED ORDER — FENTANYL CITRATE 0.05 MG/ML IJ SOLN
INTRAMUSCULAR | Status: AC
  Filled 2014-12-13: qty 5

## 2014-12-13 MED ORDER — OXYCODONE HCL 5 MG/5ML PO SOLN: 5.0000 mg | Freq: Once | ORAL | Status: DC | PRN

## 2014-12-13 MED ORDER — LIDOCAINE-EPINEPHRINE 1 %-1:100000 IJ SOLN
INTRAMUSCULAR | Status: AC
  Filled 2014-12-13: qty 1

## 2014-12-13 SURGICAL SUPPLY — 18 items
BLADE SHAVER 2.9X18 (BLADE) ×3 IMPLANT
BLADE SURG 15 STRL LF DISP TIS (BLADE) ×1 IMPLANT
BLADE SURG 15 STRL SS (BLADE) ×3
CANISTER SUCTION 2500CC (MISCELLANEOUS) ×3 IMPLANT
COVER TABLE BACK 60X90 (DRAPES) ×3 IMPLANT
GAUZE SPONGE 4X4 16PLY XRAY LF (GAUZE/BANDAGES/DRESSINGS) ×3 IMPLANT
GLOVE BIOGEL M 7.0 STRL (GLOVE) ×3 IMPLANT
GOWN STRL REUS W/ TWL LRG LVL3 (GOWN DISPOSABLE) ×2 IMPLANT
GOWN STRL REUS W/TWL LRG LVL3 (GOWN DISPOSABLE) ×9
KIT BASIN OR (CUSTOM PROCEDURE TRAY) ×3 IMPLANT
KIT ROOM TURNOVER OR (KITS) ×3 IMPLANT
MARKER SKIN DUAL TIP RULER LAB (MISCELLANEOUS) ×3 IMPLANT
NS IRRIG 1000ML POUR BTL (IV SOLUTION) ×3 IMPLANT
PACK SURGICAL SETUP 50X90 (CUSTOM PROCEDURE TRAY) ×3 IMPLANT
PAD ARMBOARD 7.5X6 YLW CONV (MISCELLANEOUS) ×6 IMPLANT
SOLUTION ANTI FOG 6CC (MISCELLANEOUS) ×3 IMPLANT
SPECIMEN JAR SMALL (MISCELLANEOUS) ×3 IMPLANT
TRAY ENT MC OR (CUSTOM PROCEDURE TRAY) ×3 IMPLANT

## 2014-12-13 NOTE — Anesthesia Preprocedure Evaluation (Addendum)
Anesthesia Evaluation  Patient identified by MRN, date of birth, ID band Patient awake    Reviewed: Allergy & Precautions, NPO status , Patient's Chart, lab work & pertinent test results  Airway Mallampati: II  TM Distance: >3 FB Neck ROM: Full    Dental  (+) Edentulous Upper, Edentulous Lower   Pulmonary asthma , COPDCurrent Smoker,  breath sounds clear to auscultation        Cardiovascular negative cardio ROS  Rhythm:Regular Rate:Normal     Neuro/Psych negative neurological ROS     GI/Hepatic Neg liver ROS, GERD-  ,  Endo/Other  negative endocrine ROS  Renal/GU negative Renal ROS     Musculoskeletal   Abdominal   Peds  Hematology negative hematology ROS (+)   Anesthesia Other Findings   Reproductive/Obstetrics                           Anesthesia Physical Anesthesia Plan  ASA: III  Anesthesia Plan: General   Post-op Pain Management:    Induction: Intravenous  Airway Management Planned: Oral ETT  Additional Equipment:   Intra-op Plan:   Post-operative Plan: Extubation in OR  Informed Consent: I have reviewed the patients History and Physical, chart, labs and discussed the procedure including the risks, benefits and alternatives for the proposed anesthesia with the patient or authorized representative who has indicated his/her understanding and acceptance.   Dental advisory given  Plan Discussed with: CRNA  Anesthesia Plan Comments:        Anesthesia Quick Evaluation

## 2014-12-13 NOTE — H&P (Signed)
[name] is an 75 y.o. female.   Chief Complaint: Vocal Cord Edema HPI: Chronic hoarseness and reflux  Past Medical History  Diagnosis Date  . Hyperlipidemia   . Asthma   . COPD (chronic obstructive pulmonary disease)   . Shortness of breath   . GERD (gastroesophageal reflux disease)     Past Surgical History  Procedure Laterality Date  . Cesarean section    . Cataract extraction      left eye    Family History  Problem Relation Age of Onset  . Renal cancer Brother   . Diabetes Mother   . Hypertension Mother   . Renal Disease Mother    Social History:  reports that she has been smoking Cigarettes.  She has a 27 pack-year smoking history. She has never used smokeless tobacco. She reports that she does not drink alcohol or use illicit drugs.  Allergies:  Allergies  Allergen Reactions  . Albuterol Shortness Of Breath    Medications Prior to Admission  Medication Sig Dispense Refill  . aspirin EC 81 MG tablet Take 81 mg by mouth at bedtime. 30 mins prior to Niacin    . ezetimibe (ZETIA) 10 MG tablet Take 10 mg by mouth at bedtime.     . mirtazapine (REMERON) 15 MG tablet Take 7.5 mg by mouth at bedtime.   0  . niacin (NIASPAN) 500 MG CR tablet Take 500 mg by mouth at bedtime.     . pantoprazole (PROTONIX) 40 MG tablet Take 40 mg by mouth daily.     [name] 2.5 MCG/ACT AERS INHALE 2 PUFFS INTO THE LUNGS DAILY. 1 Inhaler 3  . valACYclovir (VALTREX) 500 MG tablet Take 500 mg by mouth daily.    [name] 45 MCG/ACT inhaler Inhale 2 puffs into the lungs every 4 (four) hours as needed for wheezing or shortness of breath.   1    No results found for this or any previous visit (from the past 48 hour(s)). No results found.  Review of Systems  Constitutional: Negative.   HENT: Negative.   Respiratory: Negative.   Cardiovascular: Negative.   Gastrointestinal: Negative.     Blood pressure 143/79, pulse 87, temperature 98.5 F (36.9 C), temperature  source Oral, resp. rate 16, height 5\' 3"  (1.6 m), weight 57.04 kg (125 lb 12 oz), SpO2 95 %. Physical Exam  Constitutional: She is oriented to person, place, and time. She appears well-developed and well-nourished.  HENT:  bil VC edema  Neck: Normal range of motion. Neck supple.  Cardiovascular: Normal rate.   Respiratory: Effort normal.  GI: Soft.  Musculoskeletal: Normal range of motion.  Neurological: She is alert and oriented to person, place, and time.     Assessment/Plan Adm for OP MicroDL and Excision.  [place], Omar Gayden 12/13/2014, 7:26 AM

## 2014-12-13 NOTE — Anesthesia Procedure Notes (Signed)
Procedure Name: Intubation Date/Time: 12/13/2014 7:40 AM Performed by: Shirlyn Goltz Pre-anesthesia Checklist: Emergency Drugs available, Suction available, Patient being monitored and Patient identified Patient Re-evaluated:Patient Re-evaluated prior to inductionOxygen Delivery Method: Circle system utilized Preoxygenation: Pre-oxygenation with 100% oxygen Intubation Type: IV induction Ventilation: Mask ventilation without difficulty Laryngoscope Size: Miller and 2 Grade View: Grade I Tube type: Oral Tube size: 6.0 mm Number of attempts: 1 Airway Equipment and Method: Stylet Placement Confirmation: ETT inserted through vocal cords under direct vision,  positive ETCO2 and breath sounds checked- equal and bilateral Secured at: 22 cm Tube secured with: Tape Dental Injury: Teeth and Oropharynx as per pre-operative assessment

## 2014-12-13 NOTE — Anesthesia Postprocedure Evaluation (Signed)
  Anesthesia Post-op Note  Patient: [name]  Procedure(s) Performed: Procedure(s): MICROLARYNGOSCOPY (N/A) EXCISION OF VOCAL CORD POLYPS (N/A)  Patient Location: PACU  Anesthesia Type:General  Level of Consciousness: awake and alert   Airway and Oxygen Therapy: Patient Spontanous Breathing  Post-op Pain: mild  Post-op Assessment: Post-op Vital signs reviewed  Post-op Vital Signs: Reviewed  Last Vitals:  Filed Vitals:   12/13/14 1218  BP:   Pulse: 90  Temp:   Resp:     Complications: No apparent anesthesia complications

## 2014-12-13 NOTE — Transfer of Care (Signed)
Immediate Anesthesia Transfer of Care Note  Patient: [name]  Procedure(s) Performed: Procedure(s): MICROLARYNGOSCOPY (N/A) EXCISION OF VOCAL CORD POLYPS (N/A)  Patient Location: PACU  Anesthesia Type:General  Level of Consciousness: awake, alert , oriented and patient cooperative  Airway & Oxygen Therapy: Patient Spontanous Breathing and Patient connected to face mask oxygen  Post-op Assessment: Report given to RN, Post -op Vital signs reviewed and stable, Patient moving all extremities and Patient moving all extremities X 4  Post vital signs: Reviewed and stable  Last Vitals:  Filed Vitals:   12/13/14 0654  BP: 143/79  Pulse: 87  Temp: 36.9 C  Resp: 16    Complications: No apparent anesthesia complications

## 2014-12-13 NOTE — Op Note (Signed)
[name], [name]              ACCOUNT NO.:  [id number]  MEDICAL RECORD NO.:  [id number]  LOCATION:  [facility]                         FACILITY:  [facility]  PHYSICIAN:  Early Chars. Wilburn Cornelia, M.D.DATE OF BIRTH:  1940-10-13  DATE OF PROCEDURE:  12/13/2014 DATE OF DISCHARGE:                              OPERATIVE REPORT   PREOPERATIVE DIAGNOSES: 1. Chronic hoarseness. 2. Vocal cord edema.  POSTOPERATIVE DIAGNOSES: 1. Chronic hoarseness. 2. Vocal cord edema.  INDICATION FOR SURGERY: 1. Chronic hoarseness. 2. Vocal cord edema.  SURGICAL PROCEDURE:  Microlaryngoscopy with removal of polypoid mucosal tissue.  ANESTHESIA:  General endotracheal.  SURGEON:  Early Chars. Wilburn Cornelia, MD.  COMPLICATIONS:  None.  ESTIMATED BLOOD LOSS:  Minimal.  DISPOSITION:  The patient was transferred from the operating room to the recovery room in stable condition.  BRIEF HISTORY:  The patient is a 74 year old black female, who was referred to our office for evaluation of chronic hoarseness and cough. Evaluation in the office revealed significant vocal cord edema.  Initial treatment was for gastroesophageal reflux with PPI therapy.  The patient noted an improvement in her symptoms of throat fullness and cough.  She continued to have severe hoarseness and raspy voice.  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 Given her history and failure to resolve with appropriate medical therapy, I recommended that we undertake a microlaryngoscopy and excision of polypoid vocal cord tissue.  The risks and benefits of the procedure were discussed with the patient and her family, and they understood and concurred with our plan for surgery which is scheduled on elective basis at [facility].  DESCRIPTION OF PROCEDURE:  The patient was brought to the operating room, placed in supine  position on December 13, 2014.  General endotracheal anesthesia was established without difficulty.  When the patient was adequately anesthetized, a time-out was performed.  The patient was then positioned, prepped and draped.  A Dedo laryngoscope was then inserted, and the patient's larynx was thoroughly examined.  There was no evidence of mucosal ulcer or lesion.  The supraglottis, base of tongue, and hypopharynx were also inspected and again no evidence of mucosal ulcer or tumor with excellent visualization of the patient's larynx.  The micro-suspension was placed, and the operating microscope was moved into position.  The patient had heavy polypoid vocal cord edema bilaterally which was obstructing the vocal cords and the natural laryngeal aperture.  Using the laryngeal microdebrider, the polypoid mucosa was resected preserving as much of the normal mucosa as possible.  Small amount of bleeding was treated with topical adrenaline 1:1000 soaked pledget which was placed over the vocal cords bilaterally.  The pledget was removed, and the vocal cords were inspected.  Some residual polypoid material was then resected bilaterally ensuring that the anterior commissure was not implemented.  At the conclusion of the procedure, the patient's larynx was widely patent.  An Adrenalin patty was reapplied. There was no bleeding.  The patty was removed, and the sponge count was correct.  The patient was then awakened from anesthetic.  She was extubated and then transferred from the operating room  to the recovery room in stable condition.  There were no complications.  Blood loss was minimal.          ______________________________ Early Chars. Wilburn Cornelia, M.D.     DLS/MEDQ  D:  84/85/9276  T:  12/13/2014  Job:  [id number]

## 2014-12-13 NOTE — Brief Op Note (Signed)
12/13/2014  8:15 AM  PATIENT:  [name]  74 y.o. female  PRE-OPERATIVE DIAGNOSIS:  Vocal cord polyps  POST-OPERATIVE DIAGNOSIS:  Vocal cord polyps  PROCEDURE:  Procedure(s): MICROLARYNGOSCOPY (N/A) EXCISION OF VOCAL CORD POLYPS (N/A)  SURGEON:  Surgeon(s) and Role:    * Jerrell Belfast, MD - Primary  PHYSICIAN ASSISTANT:   ASSISTANTS: none   ANESTHESIA:   general  EBL:  Total I/O In: 1000 [I.V.:1000] Out: 20 [Blood:20]  BLOOD ADMINISTERED:none  DRAINS: none   LOCAL MEDICATIONS USED:  NONE  SPECIMEN:  Source of Specimen:  VC polyps  DISPOSITION OF SPECIMEN:  PATHOLOGY  COUNTS:  YES  TOURNIQUET:  * No tourniquets in log *  DICTATION: .Other Dictation: Dictation Number [id number]  PLAN OF CARE: Discharge to home after PACU  PATIENT DISPOSITION:  PACU - hemodynamically stable.   Delay start of Pharmacological VTE agent (>24hrs) due to surgical blood loss or risk of bleeding: not applicable

## 2014-12-15 ENCOUNTER — Other Ambulatory Visit: Payer: Self-pay | Admitting: Pulmonary Disease

## 2014-12-19 ENCOUNTER — Encounter (HOSPITAL_COMMUNITY): Payer: Self-pay | Admitting: Otolaryngology

## 2015-02-04 DIAGNOSIS — R490 Dysphonia: Secondary | ICD-10-CM | POA: Diagnosis not present

## 2015-02-04 DIAGNOSIS — J381 Polyp of vocal cord and larynx: Secondary | ICD-10-CM | POA: Diagnosis not present

## 2015-02-26 DIAGNOSIS — N3020 Other chronic cystitis without hematuria: Secondary | ICD-10-CM | POA: Diagnosis not present

## 2015-02-26 DIAGNOSIS — N390 Urinary tract infection, site not specified: Secondary | ICD-10-CM | POA: Diagnosis not present

## 2015-03-26 ENCOUNTER — Ambulatory Visit (INDEPENDENT_AMBULATORY_CARE_PROVIDER_SITE_OTHER): Payer: Commercial Managed Care - HMO | Admitting: Pulmonary Disease

## 2015-03-26 ENCOUNTER — Encounter: Payer: Self-pay | Admitting: Pulmonary Disease

## 2015-03-26 VITALS — BP 132/76 | HR 105 | Ht 63.0 in | Wt 128.8 lb

## 2015-03-26 DIAGNOSIS — J432 Centrilobular emphysema: Secondary | ICD-10-CM

## 2015-03-26 DIAGNOSIS — R911 Solitary pulmonary nodule: Secondary | ICD-10-CM

## 2015-03-26 MED ORDER — VARENICLINE TARTRATE 0.5 MG X 11 & 1 MG X 42 PO MISC: ORAL | Status: DC

## 2015-03-26 MED ORDER — CHANTIX CONTINUING MONTH PAK 1 MG PO TABS: 1.0000 mg | ORAL_TABLET | Freq: Two times a day (BID) | ORAL | Status: DC

## 2015-03-26 NOTE — Assessment & Plan Note (Signed)
OK to take Tylenol sinus for nasal symptoms Chantix Rx for starter pack x1 & continuation pack x 2 - Make another QUIT attempt on your birthday Ct spiriva

## 2015-03-26 NOTE — Assessment & Plan Note (Signed)
Ct chest no contrast - 1 yr FU

## 2015-03-26 NOTE — Patient Instructions (Signed)
OK to take Tylenol sinus for nasal symptoms Chantix Rx for starter pack x1 & continuation pack x 2 - Make another QUIT attempt on your birthday Ct chest no contrast

## 2015-03-26 NOTE — Progress Notes (Signed)
   Subjective:    Patient ID: [name], female    DOB: 20-Sep-1940, 74 y.o.   MRN: [id number]  HPI  PCP- Willey Blade   74 year old  smoker for FU of gold B COPD.  She started smoking as a teenager and smoked about 60 pack years. She worked in a [employer] for 20 years until she retired in 96.   03/26/2015  Chief Complaint  Patient presents with  . Follow-up    Doing well doing. Still using rescue inhaler when needed. Pt states that she still has occ dry cough and burning sensation in her nose in the mornings.     43mFU  She remains on Spiriva daily. Unfortunately, she continues to smoke - was able to cut down to 2 cigs/d with chantix but never got continuation pack, relapsed  C?o sinus drip & nose burning   Significant tests/ events  spirometry 12/2012 FEV1 of 70%, the ratio 62 and FVC of 86% suggesting mild airway obstruction. However smaller airways are significantly decreased at 30% suggesting severe airway obstruction.  Her voice has been deep for many years- Seen by ENT -Wilburn Cornelia-for chronic hoarseness, laryngoscopy -moderate post glottic erythema consistent with reflux. Did not tolerate symbicort - started on spiriva 01/2013  11/2013 Screening CT - RUL pleural based nodule - stable in 02/2014 & again in 05/2014  Review of Systems Pt denies any significant  nasal congestion or excess secretions, fever, chills, sweats, unintended wt loss, pleuritic or exertional cp, orthopnea pnd or leg swelling.  Pt also denies any obvious fluctuation in symptoms with weather or environmental change or other alleviating or aggravating factors.    Pt denies any increase in rescue therapy over baseline, denies waking up needing it or having early am exacerbations or coughing/wheezing/ or dyspnea  2     Objective:   Physical Exam  Gen. Pleasant, well-nourished, in no distress ENT - no lesions, no post nasal drip Neck: No JVD, no thyromegaly, no carotid bruits Lungs: no use  of accessory muscles, no dullness to percussion, clear without rales or rhonchi  Cardiovascular: Rhythm regular, heart sounds  normal, no murmurs or gallops, no peripheral edema Musculoskeletal: No deformities, no cyanosis or clubbing        Assessment & Plan:

## 2015-03-28 DIAGNOSIS — N3020 Other chronic cystitis without hematuria: Secondary | ICD-10-CM | POA: Diagnosis not present

## 2015-04-07 ENCOUNTER — Other Ambulatory Visit: Payer: Self-pay | Admitting: Pulmonary Disease

## 2015-04-23 ENCOUNTER — Other Ambulatory Visit: Payer: Self-pay | Admitting: Pulmonary Disease

## 2015-04-24 NOTE — Telephone Encounter (Signed)
Received RX refill request for Chantix starter pack. Do you want another starter pack sent or would you like Chantix '1mg'$  sent to pharmacy? Please advise. Thanks

## 2015-04-28 NOTE — Telephone Encounter (Signed)
Okay to refill? 

## 2015-04-29 NOTE — Telephone Encounter (Signed)
Continuation pack only - 1 mg x 3 refills

## 2015-05-01 ENCOUNTER — Other Ambulatory Visit: Payer: Self-pay | Admitting: Pulmonary Disease

## 2015-05-26 ENCOUNTER — Telehealth: Payer: Self-pay | Admitting: Pulmonary Disease

## 2015-05-26 ENCOUNTER — Ambulatory Visit (INDEPENDENT_AMBULATORY_CARE_PROVIDER_SITE_OTHER)
Admission: RE | Admit: 2015-05-26 | Discharge: 2015-05-26 | Disposition: A | Discharge status: Home / Self Care | Payer: Commercial Managed Care - HMO | Source: Ambulatory Visit | Attending: Pulmonary Disease | Admitting: Pulmonary Disease

## 2015-05-26 DIAGNOSIS — R911 Solitary pulmonary nodule: Secondary | ICD-10-CM | POA: Diagnosis not present

## 2015-05-26 DIAGNOSIS — R918 Other nonspecific abnormal finding of lung field: Secondary | ICD-10-CM | POA: Diagnosis not present

## 2015-05-26 NOTE — Telephone Encounter (Signed)
Received call report from [place] with [facility] in regards to CT chest on patient Report stated left upper lobe lung nodule that has increased in size and more solid Radiologist is thinking neoplasm and rec PET Scan or biopsy of mass  Dr Lake Bells, sending this to you as you are Doc of Day in Dr Bari Mantis presence Dr Elsworth Soho in office tomorrow  Please advise, thanks!

## 2015-05-26 NOTE — Telephone Encounter (Signed)
Please have Dr. Elsworth Soho review tomorrow

## 2015-05-27 ENCOUNTER — Other Ambulatory Visit: Payer: Self-pay | Admitting: Pulmonary Disease

## 2015-05-27 DIAGNOSIS — R911 Solitary pulmonary nodule: Secondary | ICD-10-CM

## 2015-05-27 NOTE — Telephone Encounter (Signed)
Patient notified. PET scan ordered.  Nothing further needed.

## 2015-05-27 NOTE — Telephone Encounter (Signed)
Pl see result note

## 2015-05-30 DIAGNOSIS — F17201 Nicotine dependence, unspecified, in remission: Secondary | ICD-10-CM | POA: Diagnosis not present

## 2015-05-30 DIAGNOSIS — R413 Other amnesia: Secondary | ICD-10-CM | POA: Diagnosis not present

## 2015-05-30 DIAGNOSIS — E782 Mixed hyperlipidemia: Secondary | ICD-10-CM | POA: Diagnosis not present

## 2015-05-30 DIAGNOSIS — Z0000 Encounter for general adult medical examination without abnormal findings: Secondary | ICD-10-CM | POA: Diagnosis not present

## 2015-05-30 DIAGNOSIS — J449 Chronic obstructive pulmonary disease, unspecified: Secondary | ICD-10-CM | POA: Diagnosis not present

## 2015-05-30 DIAGNOSIS — Z1239 Encounter for other screening for malignant neoplasm of breast: Secondary | ICD-10-CM | POA: Diagnosis not present

## 2015-06-05 ENCOUNTER — Ambulatory Visit (HOSPITAL_COMMUNITY)
Admission: RE | Admit: 2015-06-05 | Discharge: 2015-06-05 | Disposition: A | Discharge status: Home / Self Care | Payer: Commercial Managed Care - HMO | Source: Ambulatory Visit | Attending: Pulmonary Disease | Admitting: Pulmonary Disease

## 2015-06-05 DIAGNOSIS — R911 Solitary pulmonary nodule: Secondary | ICD-10-CM | POA: Diagnosis not present

## 2015-06-05 DIAGNOSIS — E049 Nontoxic goiter, unspecified: Secondary | ICD-10-CM | POA: Diagnosis not present

## 2015-06-05 DIAGNOSIS — J439 Emphysema, unspecified: Secondary | ICD-10-CM | POA: Insufficient documentation

## 2015-06-05 LAB — GLUCOSE, CAPILLARY: GLUCOSE-CAPILLARY: 115 mg/dL — AB (ref 65–99)

## 2015-06-05 MED ORDER — FLUDEOXYGLUCOSE F - 18 (FDG) INJECTION
6.3400 | Freq: Once | INTRAVENOUS | Status: DC | PRN
  Administered 2015-06-05: 6.34 via INTRAVENOUS
  Filled 2015-06-05: qty 6.34

## 2015-06-09 ENCOUNTER — Ambulatory Visit (INDEPENDENT_AMBULATORY_CARE_PROVIDER_SITE_OTHER): Payer: Commercial Managed Care - HMO | Admitting: Pulmonary Disease

## 2015-06-09 ENCOUNTER — Other Ambulatory Visit: Payer: Self-pay | Admitting: Pulmonary Disease

## 2015-06-09 DIAGNOSIS — J432 Centrilobular emphysema: Secondary | ICD-10-CM

## 2015-06-09 LAB — PULMONARY FUNCTION TEST
DL/VA % pred: 49 %
DL/VA: 2.36 ml/min/mmHg/L
DLCO UNC: 11.34 ml/min/mmHg
DLCO unc % pred: 48 %
FEF 25-75 PRE: 0.55 L/s
FEF 25-75 Post: 0.59 L/sec
FEF2575-%CHANGE-POST: 7 %
FEF2575-%Pred-Post: 39 %
FEF2575-%Pred-Pre: 36 %
FEV1-%Change-Post: 1 %
FEV1-%PRED-POST: 73 %
FEV1-%Pred-Pre: 72 %
FEV1-POST: 1.24 L
FEV1-Pre: 1.22 L
FEV1FVC-%Change-Post: 2 %
FEV1FVC-%Pred-Pre: 77 %
FEV6-%CHANGE-POST: 0 %
FEV6-%PRED-POST: 95 %
FEV6-%PRED-PRE: 95 %
FEV6-POST: 1.99 L
FEV6-Pre: 1.98 L
FEV6FVC-%CHANGE-POST: 0 %
FEV6FVC-%PRED-POST: 103 %
FEV6FVC-%PRED-PRE: 103 %
FVC-%CHANGE-POST: -1 %
FVC-%Pred-Post: 92 %
FVC-%Pred-Pre: 93 %
FVC-Post: 2.02 L
FVC-Pre: 2.04 L
PRE FEV1/FVC RATIO: 60 %
Post FEV1/FVC ratio: 62 %
Post FEV6/FVC ratio: 99 %
Pre FEV6/FVC Ratio: 98 %
RV % PRED: 111 %
RV: 2.51 L
TLC % pred: 95 %
TLC: 4.77 L

## 2015-06-09 NOTE — Progress Notes (Signed)
PFT done today. 

## 2015-06-12 ENCOUNTER — Ambulatory Visit (INDEPENDENT_AMBULATORY_CARE_PROVIDER_SITE_OTHER): Payer: Commercial Managed Care - HMO | Admitting: Pulmonary Disease

## 2015-06-12 ENCOUNTER — Encounter: Payer: Self-pay | Admitting: Pulmonary Disease

## 2015-06-12 VITALS — BP 110/72 | HR 77 | Temp 98.4°F | Ht 63.0 in | Wt 132.6 lb

## 2015-06-12 DIAGNOSIS — R911 Solitary pulmonary nodule: Secondary | ICD-10-CM | POA: Diagnosis not present

## 2015-06-12 DIAGNOSIS — J432 Centrilobular emphysema: Secondary | ICD-10-CM | POA: Diagnosis not present

## 2015-06-12 NOTE — Assessment & Plan Note (Addendum)
We discussed CT & PET results. The nodule was first noted in 11/2013, was stable for 6 months but now seems to be growing in size. Given hypermetabolic nature, this is likely early lung malignancy. Lung function is low but acceptable for surgery Refer to [facility]

## 2015-06-12 NOTE — Progress Notes (Signed)
   Subjective:    Patient ID: Gae Gallop, female    DOB: [birth year], 74 y.o.   MRN: [id number]  HPI  PCP- Willey Blade   74 year old  smoker for FU of gold B COPD.  She started smoking as a teenager and smoked about 60 pack years. She worked in a [employer] for 20 years until she retired in 96.    06/12/2015  Chief Complaint  Patient presents with  . Follow-up    review PET scan. occ. wheezing and SOB. dy cough in mornings. no chest pain or tightness.   Accompanied by Eldest daughter today Left upper lobe nodule was first noted on screening CT in 11/2013 and was stable on 6 month follow-up. CT chest 05/2015 >>left upper lobe lung nodule  increased in size and more solid PET 05/8337 >> hypermetabolic PFT - FEV1 &3 %, DLCO 48%  She remains on Spiriva daily. Unfortunately, she continues to smoke     Significant tests/ events  spirometry 12/2012 FEV1 of 70%, the ratio 62 and FVC of 86% suggesting mild airway obstruction. However smaller airways are significantly decreased at 30% suggesting severe airway obstruction.  Her voice has been deep for many years- Seen by ENT Wilburn Cornelia -for chronic hoarseness, laryngoscopy -moderate post glottic erythema consistent with reflux. Did not tolerate symbicort - started on spiriva 01/2013  11/2013 Screening CT - RUL pleural based nodule - stable in 02/2014 & again in 05/2014    Review of Systems neg for any significant sore throat, dysphagia, itching, sneezing, nasal congestion or excess/ purulent secretions, fever, chills, sweats, unintended wt loss, pleuritic or exertional cp, hempoptysis, orthopnea pnd or change in chronic leg swelling. Also denies presyncope, palpitations, heartburn, abdominal pain, nausea, vomiting, diarrhea or change in bowel or urinary habits, dysuria,hematuria, rash, arthralgias, visual complaints, headache, numbness weakness or ataxia.     Objective:   Physical Exam  Gen. Pleasant, well-nourished, in no  distress ENT - no lesions, no post nasal drip Neck: No JVD, no thyromegaly, no carotid bruits Lungs: no use of accessory muscles, no dullness to percussion, clear without rales or rhonchi  Cardiovascular: Rhythm regular, heart sounds  normal, no murmurs or gallops, no peripheral edema Musculoskeletal: No deformities, no cyanosis or clubbing        Assessment & Plan:

## 2015-06-12 NOTE — Patient Instructions (Signed)
We discussed CT & PET results Lung function is low but acceptable for surgery Refer to surgeon

## 2015-06-12 NOTE — Assessment & Plan Note (Signed)
Continue Spiriva Smoking cessation was again emphasized

## 2015-06-13 DIAGNOSIS — Z803 Family history of malignant neoplasm of breast: Secondary | ICD-10-CM | POA: Diagnosis not present

## 2015-06-13 DIAGNOSIS — Z1231 Encounter for screening mammogram for malignant neoplasm of breast: Secondary | ICD-10-CM | POA: Diagnosis not present

## 2015-06-18 ENCOUNTER — Institutional Professional Consult (permissible substitution) (INDEPENDENT_AMBULATORY_CARE_PROVIDER_SITE_OTHER): Payer: Commercial Managed Care - HMO | Admitting: Thoracic Surgery (Cardiothoracic Vascular Surgery)

## 2015-06-18 ENCOUNTER — Encounter: Payer: Self-pay | Admitting: Thoracic Surgery (Cardiothoracic Vascular Surgery)

## 2015-06-18 VITALS — BP 157/92 | HR 71 | Resp 16 | Ht 63.0 in | Wt 132.0 lb

## 2015-06-18 DIAGNOSIS — D381 Neoplasm of uncertain behavior of trachea, bronchus and lung: Secondary | ICD-10-CM | POA: Diagnosis not present

## 2015-06-18 NOTE — Progress Notes (Signed)
PCP is Maximino Greenland, MD Referring Provider is Rigoberto Noel, MD  Chief Complaint  Patient presents with  . Lung Lesion    LULobe...CT CHEST 05/26/15, PET 06/05/15, PFT 06/09/15    HPI: [name] is sent for consultation regarding a left upper lobe lung nodule.  [name] is a 74 year old woman with a greater than 50-pack-year history of tobacco abuse and COPD who had a groundglass opacity noted in her left upper lobe on a low-dose screening CT scan done in March 2015. She had been followed since that time with no significant change in the nodule until her CT scan in September. On that scan it was noted that the nodule was larger and more solid. She saw Dr. Elsworth Soho. A PET CT showed the nodule was hypermetabolic. There was no hilar or mediastinal adenopathy and no evidence of distant metastasis.  She smoked about a pack a day for most of her adult life starting about 50 years ago. She is now down to 3-4 cigarettes per day. She has started on Chantix and today is her target quit date. She says her appetite is good. She has not had any significant weight loss. She has a chronic productive cough of clear sputum. She denies hemoptysis. She does wheeze fairly regular basis. She's not had any unusual headaches or visual changes. She has arthritis, but no new or unusual pain related to that.  Zubrod Score: At the time of surgery this patient's most appropriate activity status/level should be described as: '[x]'$     0    Normal activity, no symptoms '[]'$     1    Restricted in physical strenuous activity but ambulatory, able to do out light work '[]'$     2    Ambulatory and capable of self care, unable to do work activities, up and about >50 % of waking hours                              '[]'$     3    Only limited self care, in bed greater than 50% of waking hours '[]'$     4    Completely disabled, no self care, confined to bed or chair '[]'$     5    Moribund   Past Medical History  Diagnosis Date  .  Hyperlipidemia   . Asthma   . COPD (chronic obstructive pulmonary disease) ([place])   . Shortness of breath   . GERD (gastroesophageal reflux disease)     Past Surgical History  Procedure Laterality Date  . Cesarean section    . Cataract extraction      left eye  . Microlaryngoscopy N/A 12/13/2014    Procedure: MICROLARYNGOSCOPY;  Surgeon: Jerrell Belfast, MD;  Location: [facility];  Service: ENT;  Laterality: N/A;  . Mass excision N/A 12/13/2014    Procedure: EXCISION OF VOCAL CORD POLYPS;  Surgeon: Jerrell Belfast, MD;  Location: [facility] OR;  Service: ENT;  Laterality: N/A;    Family History  Problem Relation Age of Onset  . Renal cancer Brother   . Diabetes Mother   . Hypertension Mother   . Renal Disease Mother     Social History Social History  Substance Use Topics  . Smoking status: Current Every Day Smoker -- 1.00 packs/day for 54 years    Types: Cigarettes  . Smokeless tobacco: Never Used     Comment: Currently smoking 5-6 cigarettes per day.  . Alcohol Use:  No    Current Outpatient Prescriptions  Medication Sig Dispense Refill  . aspirin 81 MG tablet Take 81 mg by mouth daily.    Hendricks Limes CONTINUING MONTH PAK 1 MG tablet Take 1 tablet (1 mg total) by mouth 2 (two) times daily. 60 tablet 2  . ezetimibe (ZETIA) 10 MG tablet Take 10 mg by mouth at bedtime.     . mirtazapine (REMERON) 15 MG tablet Take 7.5 mg by mouth at bedtime.   0  . niacin (NIASPAN) 500 MG CR tablet Take 500 mg by mouth at bedtime.     . pantoprazole (PROTONIX) 40 MG tablet Take 40 mg by mouth daily.     Marland Kitchen SPIRIVA RESPIMAT 2.5 MCG/ACT AERS INHALE 2 PUFFS INTO THE LUNGS DAILY. 4 g 3  . valACYclovir (VALTREX) 500 MG tablet Take 500 mg by mouth daily.    Penne Lash HFA 45 MCG/ACT inhaler Inhale 2 puffs into the lungs every 4 (four) hours as needed for wheezing or shortness of breath.   1   No current facility-administered medications for this visit.    Allergies  Allergen Reactions  . Albuterol Shortness Of  Breath    Review of Systems  Constitutional: Positive for activity change and fatigue. Negative for chills, appetite change and unexpected weight change.  HENT: Positive for dental problem (Dentures).   Eyes: Negative for visual disturbance.       Has had cataracts removed  Respiratory: Positive for cough (productive of clear sputum, denies hemoptysis), shortness of breath (With exertion, walking one fourth of a mile on level ground 2 flights of stairs) and wheezing.   Gastrointestinal: Positive for constipation. Negative for blood in stool.       Reflux  Genitourinary: Negative for hematuria and difficulty urinating.  Musculoskeletal: Positive for joint swelling and arthralgias.  Neurological: Negative for seizures, syncope, weakness and headaches.  Hematological: Negative for adenopathy. Does not bruise/bleed easily.  All other systems reviewed and are negative.   BP 157/92 mmHg  Pulse 71  Resp 16  Ht '5\' 3"'$  (1.6 m)  Wt 132 lb (59.875 kg)  BMI 23.39 kg/m2  SpO2 96% Physical Exam  Constitutional: She is oriented to person, place, and time. She appears well-developed and well-nourished. No distress.  HENT:  Head: Normocephalic and atraumatic.  Mouth/Throat: No oropharyngeal exudate.  Eyes: Conjunctivae and EOM are normal. No scleral icterus.  Neck: Neck supple. No thyromegaly present.  Cardiovascular: Normal rate, regular rhythm, normal heart sounds and intact distal pulses.  Exam reveals no gallop and no friction rub.   No murmur heard. Pulmonary/Chest: Effort normal and breath sounds normal. No respiratory distress. She has no wheezes.  Abdominal: Soft. Bowel sounds are normal. She exhibits no distension. There is no tenderness.  Musculoskeletal: She exhibits no edema.  Lymphadenopathy:    She has no cervical adenopathy.  Neurological: She is alert and oriented to person, place, and time. No cranial nerve deficit.  No motor deficit  Skin: Skin is warm and dry.  Vitals  reviewed.    Diagnostic Tests: CT CHEST WITHOUT CONTRAST  TECHNIQUE: Multidetector CT imaging of the chest was performed following the standard protocol without IV contrast.  COMPARISON: CT scan of May 24, 2014.  FINDINGS: Stable large right thyroid nodule is noted. This has been biopsied previously. Atherosclerosis of thoracic aorta is noted without aneurysm formation. No significant mediastinal adenopathy is noted. Mild coronary artery calcifications are noted. Visualized portion of upper abdomen is unremarkable. No pneumothorax or pleural  effusion is noted the nodular density seen in the posterior portion the left upper lobe on prior exam is again noted and appears to be enlarged. It currently measures 12 x 11 x 9 mm in size, and appears more solid compared to prior exam. This is concerning for malignancy. Right lung is clear.  IMPRESSION: Stable right thyroid nodule as described on prior exam.  Nodular density seen posteriorly in the left upper lobe does appear to have increased in size compared to prior exam, now measuring 12 x 11 x 9 mm. It also appears to be more solid in appearance. This is concerning for neoplasm or malignancy, and further evaluation with PET scan or surgical biopsy is recommended. These results will be called to the ordering clinician or representative by the Radiologist Assistant, and communication documented in the PACS or zVision Dashboard.   Electronically Signed  By: Marijo Conception, M.D.  On: 05/26/2015 15:20 NUCLEAR MEDICINE PET SKULL BASE TO THIGH  TECHNIQUE: 6.3 mCi F-18 FDG was injected intravenously. Full-ring PET imaging was performed from the skull base to thigh after the radiotracer. CT data was obtained and used for attenuation correction and anatomic localization.  FASTING BLOOD GLUCOSE: Value: 115 mg/dl  COMPARISON: Chest CT on 05/26/2015  FINDINGS: NECK  No hypermetabolic lymph nodes in the  neck.  CHEST  No hypermetabolic mediastinal or hilar nodes. Mild goiter is seen which shows diffuse hypermetabolic activity, suspicious for thyroiditis.  11 mm pulmonary nodule in the central left upper lobe is hypermetabolic, with SUV max of 5.4. This is suspicious for bronchogenic carcinoma. No other suspicious pulmonary nodules or masses identified. Mild emphysema noted. No evidence of pleural effusion.  ABDOMEN/PELVIS  No abnormal hypermetabolic activity within the liver, pancreas, adrenal glands, or spleen. No hypermetabolic lymph nodes in the abdomen or pelvis.  SKELETON  No focal hypermetabolic activity to suggest skeletal metastasis.  IMPRESSION: Hypermetabolic 11 mm central left upper lobe pulmonary nodule, suspicious for bronchogenic carcinoma.  No evidence of thoracic nodal or distant metastatic disease.  Mildly enlarged thyroid with diffuse hypermetabolic activity, suspicious for thyroiditis. Suggest correlation with thyroid function tests.   Electronically Signed  By: Earle Gell M.D.  On: 06/05/2015 11:35  Pulmonary function testing FVC 2.04 (93%) FEV1 1.22 (72%), no change with bronchodilators FEV1/FVC 60% DLCO 11.34 (48%)  I personally reviewed the CT and PET CT images and agree with the findings as noted above.  Impression: [name] is a 74 year old woman history of tobacco abuse who has a suspicious left upper lobe nodule. This was first noted on a low-dose screening CT back in March 2015. It there remained a stable groundglass opacity over multiple follow-up studies until a CT in September 2016 which showed the nodule was larger and more solid. A PET CT showed the nodule was hypermetabolic and the malignant range with an SUV of 5.4. Given her age, smoking history, and appearance of the nodule on imaging this almost certainly represents a new primary bronchogenic carcinoma and has to be considered that unless it can be proven  otherwise.  The nodule is too central and closely related to a pulmonary artery branch to consider CT-guided biopsy. I do not think the angle is favorable for a bronchoscopic biopsy either.   I recommended to [name] that we proceed with left VATS, possible wedge resection, possible upper lobectomy for definitive diagnosis and treatment of the nodule. The nodule is relatively central and close to the pulmonary artery branches I am not sure a. wedge  resection will be feasible. More likely will have to go ahead and do a lobectomy from the beginning.  I described the general nature of the procedure to [name] and her nephew. I reviewed the need for general anesthesia, the incisions to be used, the minimally invasive approach, the expected hospital stay and overall recovery. I reviewed the indications, risks, benefits, and alternatives. She understands the risks include, but are not limited to death, MI, DVT, PE, bleeding, possible need for transfusion, infection, prolonged air leak, cardiac arrhythmias, as well as the possibility of other unforeseeable complications. She understands that no guarantee can be given another cure, but surgery would offer her the best chance for cure.  She wishes to proceed with surgery. She needs to check with her family see when she will have help at home before scheduling the procedure.  Plan: Left VATS, possible wedge resection, possible left upper lobectomy on the date to be determined. Patient will call to schedule.  Melrose Nakayama, MD [facility] [phone]

## 2015-06-23 ENCOUNTER — Other Ambulatory Visit: Payer: Self-pay | Admitting: *Deleted

## 2015-06-23 DIAGNOSIS — R911 Solitary pulmonary nodule: Secondary | ICD-10-CM

## 2015-07-02 ENCOUNTER — Encounter (HOSPITAL_COMMUNITY): Payer: Self-pay

## 2015-07-02 ENCOUNTER — Encounter (HOSPITAL_COMMUNITY)
Admission: RE | Admit: 2015-07-02 | Discharge: 2015-07-02 | Disposition: A | Discharge status: Home / Self Care | Payer: Commercial Managed Care - HMO | Source: Ambulatory Visit | Attending: Thoracic Surgery (Cardiothoracic Vascular Surgery) | Admitting: Thoracic Surgery (Cardiothoracic Vascular Surgery)

## 2015-07-02 ENCOUNTER — Other Ambulatory Visit (HOSPITAL_COMMUNITY): Payer: Commercial Managed Care - HMO

## 2015-07-02 ENCOUNTER — Other Ambulatory Visit: Payer: Self-pay

## 2015-07-02 VITALS — BP 141/77 | HR 85 | Temp 98.2°F | Resp 18 | Ht 63.0 in | Wt 133.6 lb

## 2015-07-02 DIAGNOSIS — Z9889 Other specified postprocedural states: Secondary | ICD-10-CM | POA: Diagnosis not present

## 2015-07-02 DIAGNOSIS — Z01818 Encounter for other preprocedural examination: Secondary | ICD-10-CM | POA: Diagnosis not present

## 2015-07-02 DIAGNOSIS — C3491 Malignant neoplasm of unspecified part of right bronchus or lung: Secondary | ICD-10-CM | POA: Diagnosis not present

## 2015-07-02 DIAGNOSIS — R110 Nausea: Secondary | ICD-10-CM | POA: Diagnosis not present

## 2015-07-02 DIAGNOSIS — R0602 Shortness of breath: Secondary | ICD-10-CM | POA: Diagnosis not present

## 2015-07-02 DIAGNOSIS — R269 Unspecified abnormalities of gait and mobility: Secondary | ICD-10-CM | POA: Diagnosis not present

## 2015-07-02 DIAGNOSIS — Z01812 Encounter for preprocedural laboratory examination: Secondary | ICD-10-CM

## 2015-07-02 DIAGNOSIS — J449 Chronic obstructive pulmonary disease, unspecified: Secondary | ICD-10-CM | POA: Diagnosis present

## 2015-07-02 DIAGNOSIS — R911 Solitary pulmonary nodule: Secondary | ICD-10-CM | POA: Diagnosis present

## 2015-07-02 DIAGNOSIS — E069 Thyroiditis, unspecified: Secondary | ICD-10-CM | POA: Diagnosis present

## 2015-07-02 DIAGNOSIS — F17210 Nicotine dependence, cigarettes, uncomplicated: Secondary | ICD-10-CM | POA: Diagnosis present

## 2015-07-02 DIAGNOSIS — R001 Bradycardia, unspecified: Secondary | ICD-10-CM | POA: Diagnosis not present

## 2015-07-02 DIAGNOSIS — J439 Emphysema, unspecified: Secondary | ICD-10-CM | POA: Diagnosis not present

## 2015-07-02 DIAGNOSIS — Z7982 Long term (current) use of aspirin: Secondary | ICD-10-CM | POA: Diagnosis not present

## 2015-07-02 DIAGNOSIS — Z888 Allergy status to other drugs, medicaments and biological substances status: Secondary | ICD-10-CM | POA: Diagnosis not present

## 2015-07-02 DIAGNOSIS — J939 Pneumothorax, unspecified: Secondary | ICD-10-CM | POA: Diagnosis not present

## 2015-07-02 DIAGNOSIS — Z809 Family history of malignant neoplasm, unspecified: Secondary | ICD-10-CM | POA: Diagnosis not present

## 2015-07-02 DIAGNOSIS — C3492 Malignant neoplasm of unspecified part of left bronchus or lung: Secondary | ICD-10-CM | POA: Diagnosis not present

## 2015-07-02 DIAGNOSIS — G3184 Mild cognitive impairment, so stated: Secondary | ICD-10-CM | POA: Diagnosis not present

## 2015-07-02 DIAGNOSIS — T8182XA Emphysema (subcutaneous) resulting from a procedure, initial encounter: Secondary | ICD-10-CM | POA: Diagnosis not present

## 2015-07-02 DIAGNOSIS — Z13820 Encounter for screening for osteoporosis: Secondary | ICD-10-CM | POA: Diagnosis not present

## 2015-07-02 DIAGNOSIS — J45909 Unspecified asthma, uncomplicated: Secondary | ICD-10-CM | POA: Diagnosis present

## 2015-07-02 DIAGNOSIS — Z4682 Encounter for fitting and adjustment of non-vascular catheter: Secondary | ICD-10-CM | POA: Diagnosis not present

## 2015-07-02 DIAGNOSIS — Z0183 Encounter for blood typing: Secondary | ICD-10-CM

## 2015-07-02 DIAGNOSIS — K219 Gastro-esophageal reflux disease without esophagitis: Secondary | ICD-10-CM | POA: Diagnosis present

## 2015-07-02 DIAGNOSIS — J9382 Other air leak: Secondary | ICD-10-CM | POA: Diagnosis not present

## 2015-07-02 DIAGNOSIS — Z720 Tobacco use: Secondary | ICD-10-CM | POA: Diagnosis not present

## 2015-07-02 DIAGNOSIS — E785 Hyperlipidemia, unspecified: Secondary | ICD-10-CM | POA: Diagnosis present

## 2015-07-02 DIAGNOSIS — C3412 Malignant neoplasm of upper lobe, left bronchus or lung: Secondary | ICD-10-CM | POA: Diagnosis present

## 2015-07-02 DIAGNOSIS — E876 Hypokalemia: Secondary | ICD-10-CM | POA: Diagnosis not present

## 2015-07-02 DIAGNOSIS — J95811 Postprocedural pneumothorax: Secondary | ICD-10-CM | POA: Diagnosis not present

## 2015-07-02 DIAGNOSIS — R918 Other nonspecific abnormal finding of lung field: Secondary | ICD-10-CM | POA: Diagnosis not present

## 2015-07-02 DIAGNOSIS — J95812 Postprocedural air leak: Secondary | ICD-10-CM | POA: Diagnosis not present

## 2015-07-02 DIAGNOSIS — J9811 Atelectasis: Secondary | ICD-10-CM | POA: Diagnosis not present

## 2015-07-02 DIAGNOSIS — Z79899 Other long term (current) drug therapy: Secondary | ICD-10-CM | POA: Diagnosis not present

## 2015-07-02 DIAGNOSIS — Z9842 Cataract extraction status, left eye: Secondary | ICD-10-CM | POA: Diagnosis not present

## 2015-07-02 HISTORY — DX: Depression, unspecified: F32.A

## 2015-07-02 HISTORY — DX: Unspecified osteoarthritis, unspecified site: M19.90

## 2015-07-02 HISTORY — DX: Major depressive disorder, single episode, unspecified: F32.9

## 2015-07-02 HISTORY — DX: Nontoxic single thyroid nodule: E04.1

## 2015-07-02 LAB — CBC
HCT: 41.4 % (ref 36.0–46.0)
Hemoglobin: 14.1 g/dL (ref 12.0–15.0)
MCH: 33.4 pg (ref 26.0–34.0)
MCHC: 34.1 g/dL (ref 30.0–36.0)
MCV: 98.1 fL (ref 78.0–100.0)
PLATELETS: 230 10*3/uL (ref 150–400)
RBC: 4.22 MIL/uL (ref 3.87–5.11)
RDW: 14.6 % (ref 11.5–15.5)
WBC: 5.1 10*3/uL (ref 4.0–10.5)

## 2015-07-02 LAB — BLOOD GAS, ARTERIAL
ACID-BASE EXCESS: 1.3 mmol/L (ref 0.0–2.0)
BICARBONATE: 25.1 meq/L — AB (ref 20.0–24.0)
DRAWN BY: 206361
FIO2: 0.21
O2 Saturation: 94.7 %
PATIENT TEMPERATURE: 98.6
TCO2: 26.3 mmol/L (ref 0–100)
pCO2 arterial: 38.3 mmHg (ref 35.0–45.0)
pH, Arterial: 7.433 (ref 7.350–7.450)
pO2, Arterial: 71.7 mmHg — ABNORMAL LOW (ref 80.0–100.0)

## 2015-07-02 LAB — APTT: aPTT: 31 seconds (ref 24–37)

## 2015-07-02 LAB — SURGICAL PCR SCREEN
MRSA, PCR: NEGATIVE
Staphylococcus aureus: NEGATIVE

## 2015-07-02 LAB — COMPREHENSIVE METABOLIC PANEL
ALT: 19 U/L (ref 14–54)
AST: 32 U/L (ref 15–41)
Albumin: 3.8 g/dL (ref 3.5–5.0)
Alkaline Phosphatase: 69 U/L (ref 38–126)
Anion gap: 11 (ref 5–15)
BUN: 10 mg/dL (ref 6–20)
CHLORIDE: 109 mmol/L (ref 101–111)
CO2: 23 mmol/L (ref 22–32)
CREATININE: 0.94 mg/dL (ref 0.44–1.00)
Calcium: 9.5 mg/dL (ref 8.9–10.3)
GFR calc non Af Amer: 58 mL/min — ABNORMAL LOW (ref >60)
Glucose, Bld: 121 mg/dL — ABNORMAL HIGH (ref 65–99)
POTASSIUM: 3.9 mmol/L (ref 3.5–5.1)
SODIUM: 143 mmol/L (ref 135–145)
Total Bilirubin: 0.7 mg/dL (ref 0.3–1.2)
Total Protein: 6.7 g/dL (ref 6.5–8.1)

## 2015-07-02 LAB — URINALYSIS, ROUTINE W REFLEX MICROSCOPIC
BILIRUBIN URINE: NEGATIVE
GLUCOSE, UA: NEGATIVE mg/dL
KETONES UR: NEGATIVE mg/dL
Nitrite: NEGATIVE
PH: 6.5 (ref 5.0–8.0)
PROTEIN: NEGATIVE mg/dL
Specific Gravity, Urine: 1.014 (ref 1.005–1.030)
Urobilinogen, UA: 0.2 mg/dL (ref 0.0–1.0)

## 2015-07-02 LAB — URINE MICROSCOPIC-ADD ON

## 2015-07-02 LAB — ABO/RH: ABO/RH(D): O POS

## 2015-07-02 LAB — PROTIME-INR
INR: 1.02 (ref 0.00–1.49)
PROTHROMBIN TIME: 13.6 s (ref 11.6–15.2)

## 2015-07-02 NOTE — Pre-Procedure Instructions (Addendum)
[name]  07/02/2015      [id number] [id number]: [phone]: [phone]   Your procedure is scheduled on  Friday  07/04/15  [facility]  Call this number if you have problems the morning of surgery:  [phone]   Remember:  Do not eat food or drink liquids after midnight.  Take these medicines the morning of surgery with A SIP OF WATER   PANTOPRAZOLE (PROTONIX), INHALERS, REMERON (MIRTAZAPINE)    STOP TAKING ASPIRIN    Do not wear jewelry, make-up or nail polish.  Do not wear lotions, powders, or perfumes.  You may wear deodorant.  Do not shave 48 hours prior to surgery.  Men may shave face and neck.  Do not bring valuables to the hospital.  [facility].  Contacts, dentures or bridgework may not be worn into surgery.  Leave your suitcase in the car.  After surgery it may be brought to your room.  For patients admitted to the hospital, discharge time will be determined by your treatment team.  Patients discharged the day of surgery will not be allowed to drive home.   Name and phone number of your driver:    Special instructions:  [facility]  Before surgery, you can play an important role.  Because skin is not sterile, your skin needs to be as free of germs as possible.  You can reduce the number of germs on you skin by washing with CHG (chlorahexidine gluconate) soap before surgery.  CHG is an antiseptic cleaner which kills germs and bonds with the skin to continue killing germs even after washing.  Please DO NOT use if you have an allergy to CHG or antibacterial soaps.  If your skin becomes reddened/irritated stop using the CHG and inform your nurse when you arrive at Short Stay.  Do not shave (including legs and underarms) for at least 48 hours  prior to the first CHG shower.  You may shave your face.  Please follow these instructions carefully:   1.  Shower with CHG Soap the night before surgery and the                                morning of Surgery.  2.  If you choose to wash your hair, wash your hair first as usual with your       normal shampoo.  3.  After you shampoo, rinse your hair and body thoroughly to remove the                      Shampoo.  4.  Use CHG as you would any other liquid soap.  You can apply chg directly       to the skin and wash gently with scrungie or a clean washcloth.  5.  Apply the CHG Soap to your body ONLY FROM THE NECK DOWN.        Do not use on open wounds or open sores.  Avoid contact with your eyes,       ears, mouth and genitals (private parts).  Wash genitals (private parts)       with your normal soap.  6.  Wash thoroughly, paying special attention  to the area where your surgery        will be performed.  7.  Thoroughly rinse your body with warm water from the neck down.  8.  DO NOT shower/wash with your normal soap after using and rinsing off       the CHG Soap.  9.  Pat yourself dry with a clean towel.            10.  Wear clean pajamas.            11.  Place clean sheets on your bed the night of your first shower and do not        sleep with pets.  Day of Surgery  Do not apply any lotions/deoderants the morning of surgery.  Please wear clean clothes to the hospital/surgery center.    Please read over the following fact sheets that you were given. Pain Booklet, Coughing and Deep Breathing, Blood Transfusion Information, MRSA Information and Surgical Site Infection Prevention

## 2015-07-03 DIAGNOSIS — C3491 Malignant neoplasm of unspecified part of right bronchus or lung: Secondary | ICD-10-CM | POA: Diagnosis not present

## 2015-07-03 DIAGNOSIS — Z720 Tobacco use: Secondary | ICD-10-CM | POA: Diagnosis not present

## 2015-07-03 DIAGNOSIS — G3184 Mild cognitive impairment, so stated: Secondary | ICD-10-CM | POA: Diagnosis not present

## 2015-07-03 MED ORDER — DEXTROSE 5 % IV SOLN
1.5000 g | INTRAVENOUS | Status: AC
  Administered 2015-07-04: 1.5 g via INTRAVENOUS
  Filled 2015-07-03: qty 1.5

## 2015-07-04 ENCOUNTER — Encounter (HOSPITAL_COMMUNITY): Payer: Self-pay | Admitting: *Deleted

## 2015-07-04 ENCOUNTER — Inpatient Hospital Stay (HOSPITAL_COMMUNITY): Payer: Commercial Managed Care - HMO | Admitting: Anesthesiology

## 2015-07-04 ENCOUNTER — Inpatient Hospital Stay (HOSPITAL_COMMUNITY): Payer: Commercial Managed Care - HMO

## 2015-07-04 ENCOUNTER — Encounter (HOSPITAL_COMMUNITY)
Admission: RE | Disposition: A | Discharge status: Home / Self Care | Payer: Self-pay | Source: Ambulatory Visit | Attending: Thoracic Surgery (Cardiothoracic Vascular Surgery)

## 2015-07-04 ENCOUNTER — Inpatient Hospital Stay (HOSPITAL_COMMUNITY)
Admission: RE | Admit: 2015-07-04 | Discharge: 2015-07-22 | DRG: 164 | Disposition: A | Discharge status: Home / Self Care | Payer: Commercial Managed Care - HMO | Source: Ambulatory Visit | Attending: Thoracic Surgery (Cardiothoracic Vascular Surgery) | Admitting: Thoracic Surgery (Cardiothoracic Vascular Surgery)

## 2015-07-04 DIAGNOSIS — E069 Thyroiditis, unspecified: Secondary | ICD-10-CM | POA: Diagnosis present

## 2015-07-04 DIAGNOSIS — Z7982 Long term (current) use of aspirin: Secondary | ICD-10-CM | POA: Diagnosis not present

## 2015-07-04 DIAGNOSIS — R52 Pain, unspecified: Secondary | ICD-10-CM

## 2015-07-04 DIAGNOSIS — T8182XA Emphysema (subcutaneous) resulting from a procedure, initial encounter: Secondary | ICD-10-CM | POA: Diagnosis not present

## 2015-07-04 DIAGNOSIS — Z9842 Cataract extraction status, left eye: Secondary | ICD-10-CM | POA: Diagnosis not present

## 2015-07-04 DIAGNOSIS — E785 Hyperlipidemia, unspecified: Secondary | ICD-10-CM | POA: Diagnosis present

## 2015-07-04 DIAGNOSIS — E876 Hypokalemia: Secondary | ICD-10-CM | POA: Diagnosis not present

## 2015-07-04 DIAGNOSIS — J939 Pneumothorax, unspecified: Secondary | ICD-10-CM

## 2015-07-04 DIAGNOSIS — J95811 Postprocedural pneumothorax: Secondary | ICD-10-CM | POA: Diagnosis not present

## 2015-07-04 DIAGNOSIS — R110 Nausea: Secondary | ICD-10-CM | POA: Diagnosis not present

## 2015-07-04 DIAGNOSIS — R911 Solitary pulmonary nodule: Secondary | ICD-10-CM

## 2015-07-04 DIAGNOSIS — Z809 Family history of malignant neoplasm, unspecified: Secondary | ICD-10-CM | POA: Diagnosis not present

## 2015-07-04 DIAGNOSIS — K219 Gastro-esophageal reflux disease without esophagitis: Secondary | ICD-10-CM | POA: Diagnosis present

## 2015-07-04 DIAGNOSIS — C3492 Malignant neoplasm of unspecified part of left bronchus or lung: Secondary | ICD-10-CM

## 2015-07-04 DIAGNOSIS — J95812 Postprocedural air leak: Secondary | ICD-10-CM | POA: Diagnosis not present

## 2015-07-04 DIAGNOSIS — Z888 Allergy status to other drugs, medicaments and biological substances status: Secondary | ICD-10-CM | POA: Diagnosis not present

## 2015-07-04 DIAGNOSIS — F17210 Nicotine dependence, cigarettes, uncomplicated: Secondary | ICD-10-CM | POA: Diagnosis present

## 2015-07-04 DIAGNOSIS — Z9689 Presence of other specified functional implants: Secondary | ICD-10-CM

## 2015-07-04 DIAGNOSIS — J9811 Atelectasis: Secondary | ICD-10-CM

## 2015-07-04 DIAGNOSIS — Z79899 Other long term (current) drug therapy: Secondary | ICD-10-CM | POA: Diagnosis not present

## 2015-07-04 DIAGNOSIS — Z938 Other artificial opening status: Secondary | ICD-10-CM

## 2015-07-04 DIAGNOSIS — C3412 Malignant neoplasm of upper lobe, left bronchus or lung: Secondary | ICD-10-CM | POA: Diagnosis present

## 2015-07-04 DIAGNOSIS — Z85110 Personal history of malignant carcinoid tumor of bronchus and lung: Secondary | ICD-10-CM | POA: Diagnosis present

## 2015-07-04 DIAGNOSIS — J432 Centrilobular emphysema: Secondary | ICD-10-CM

## 2015-07-04 DIAGNOSIS — E440 Moderate protein-calorie malnutrition: Secondary | ICD-10-CM

## 2015-07-04 DIAGNOSIS — J45909 Unspecified asthma, uncomplicated: Secondary | ICD-10-CM | POA: Diagnosis present

## 2015-07-04 DIAGNOSIS — R0602 Shortness of breath: Secondary | ICD-10-CM

## 2015-07-04 DIAGNOSIS — R001 Bradycardia, unspecified: Secondary | ICD-10-CM | POA: Diagnosis not present

## 2015-07-04 DIAGNOSIS — J449 Chronic obstructive pulmonary disease, unspecified: Secondary | ICD-10-CM | POA: Diagnosis present

## 2015-07-04 DIAGNOSIS — Z902 Acquired absence of lung [part of]: Secondary | ICD-10-CM

## 2015-07-04 HISTORY — PX: VIDEO ASSISTED THORACOSCOPY (VATS)/ LOBECTOMY: SHX6169

## 2015-07-04 LAB — PREPARE RBC (CROSSMATCH)

## 2015-07-04 SURGERY — VIDEO ASSISTED THORACOSCOPY (VATS)/ LOBECTOMY
Anesthesia: General | Site: Chest | Laterality: Left

## 2015-07-04 MED ORDER — HYDROMORPHONE HCL 1 MG/ML IJ SOLN
0.2500 mg | INTRAMUSCULAR | Status: DC | PRN
  Administered 2015-07-04 (×4): 0.5 mg via INTRAVENOUS

## 2015-07-04 MED ORDER — VALACYCLOVIR HCL 500 MG PO TABS
500.0000 mg | ORAL_TABLET | Freq: Every day | ORAL | Status: DC
  Administered 2015-07-05 – 2015-07-22 (×17): 500 mg via ORAL
  Filled 2015-07-04 (×18): qty 1

## 2015-07-04 MED ORDER — SUCCINYLCHOLINE CHLORIDE 20 MG/ML IJ SOLN
INTRAMUSCULAR | Status: AC
  Filled 2015-07-04: qty 1

## 2015-07-04 MED ORDER — BUPIVACAINE 0.5 % ON-Q PUMP SINGLE CATH 400 ML
400.0000 mL | INJECTION | Status: DC
  Filled 2015-07-04: qty 400

## 2015-07-04 MED ORDER — LEVALBUTEROL HCL 0.63 MG/3ML IN NEBU
0.6300 mg | INHALATION_SOLUTION | RESPIRATORY_TRACT | Status: DC | PRN
  Administered 2015-07-04 – 2015-07-12 (×2): 0.63 mg via RESPIRATORY_TRACT
  Filled 2015-07-04 (×2): qty 3

## 2015-07-04 MED ORDER — FENTANYL CITRATE (PF) 250 MCG/5ML IJ SOLN
INTRAMUSCULAR | Status: AC
  Filled 2015-07-04: qty 5

## 2015-07-04 MED ORDER — ONDANSETRON HCL 4 MG/2ML IJ SOLN
4.0000 mg | Freq: Four times a day (QID) | INTRAMUSCULAR | Status: DC | PRN
  Administered 2015-07-05 – 2015-07-20 (×4): 4 mg via INTRAVENOUS
  Filled 2015-07-04 (×5): qty 2

## 2015-07-04 MED ORDER — LACTATED RINGERS IV SOLN
INTRAVENOUS | Status: DC | PRN
  Administered 2015-07-04: 07:00:00 via INTRAVENOUS

## 2015-07-04 MED ORDER — HYDROMORPHONE HCL 1 MG/ML IJ SOLN
INTRAMUSCULAR | Status: AC
  Filled 2015-07-04: qty 1

## 2015-07-04 MED ORDER — ONDANSETRON HCL 4 MG/2ML IJ SOLN
INTRAMUSCULAR | Status: DC | PRN
  Administered 2015-07-04: 4 mg via INTRAVENOUS

## 2015-07-04 MED ORDER — PANTOPRAZOLE SODIUM 40 MG PO TBEC
40.0000 mg | DELAYED_RELEASE_TABLET | Freq: Every day | ORAL | Status: DC
  Administered 2015-07-05 – 2015-07-22 (×17): 40 mg via ORAL
  Filled 2015-07-04 (×17): qty 1

## 2015-07-04 MED ORDER — ROCURONIUM BROMIDE 100 MG/10ML IV SOLN
INTRAVENOUS | Status: DC | PRN
  Administered 2015-07-04: 50 mg via INTRAVENOUS
  Administered 2015-07-04: 10 mg via INTRAVENOUS

## 2015-07-04 MED ORDER — BUPIVACAINE ON-Q PAIN PUMP (FOR ORDER SET NO CHG)
INJECTION | Status: AC
  Filled 2015-07-04: qty 1

## 2015-07-04 MED ORDER — PHENYLEPHRINE HCL 10 MG/ML IJ SOLN
INTRAMUSCULAR | Status: DC | PRN
  Administered 2015-07-04: 80 ug via INTRAVENOUS

## 2015-07-04 MED ORDER — ONDANSETRON HCL 4 MG/2ML IJ SOLN
4.0000 mg | Freq: Four times a day (QID) | INTRAMUSCULAR | Status: DC | PRN
  Filled 2015-07-04: qty 2

## 2015-07-04 MED ORDER — SODIUM CHLORIDE 0.9 % IV SOLN
Freq: Once | INTRAVENOUS | Status: DC
Start: 2015-07-04 — End: 2015-07-04

## 2015-07-04 MED ORDER — LEVALBUTEROL TARTRATE 45 MCG/ACT IN AERO: 2.0000 | INHALATION_SPRAY | RESPIRATORY_TRACT | Status: DC | PRN

## 2015-07-04 MED ORDER — ACETAMINOPHEN 160 MG/5ML PO SOLN
1000.0000 mg | Freq: Four times a day (QID) | ORAL | Status: AC
  Administered 2015-07-04: 1000 mg via ORAL
  Filled 2015-07-04: qty 40.6

## 2015-07-04 MED ORDER — ROCURONIUM BROMIDE 50 MG/5ML IV SOLN
INTRAVENOUS | Status: AC
  Filled 2015-07-04: qty 1

## 2015-07-04 MED ORDER — ASPIRIN EC 81 MG PO TBEC
81.0000 mg | DELAYED_RELEASE_TABLET | Freq: Every day | ORAL | Status: DC
  Administered 2015-07-06 – 2015-07-22 (×16): 81 mg via ORAL
  Filled 2015-07-04 (×23): qty 1

## 2015-07-04 MED ORDER — LIDOCAINE HCL (CARDIAC) 20 MG/ML IV SOLN
INTRAVENOUS | Status: DC | PRN
  Administered 2015-07-04: 100 mg via INTRAVENOUS

## 2015-07-04 MED ORDER — OXYCODONE HCL 5 MG PO TABS
5.0000 mg | ORAL_TABLET | ORAL | Status: DC | PRN
  Administered 2015-07-04 – 2015-07-05 (×3): 10 mg via ORAL
  Administered 2015-07-05: 5 mg via ORAL
  Administered 2015-07-06 – 2015-07-11 (×21): 10 mg via ORAL
  Administered 2015-07-11: 5 mg via ORAL
  Administered 2015-07-12 – 2015-07-21 (×27): 10 mg via ORAL
  Administered 2015-07-22: 5 mg via ORAL
  Administered 2015-07-22: 10 mg via ORAL
  Filled 2015-07-04 (×5): qty 2
  Filled 2015-07-04: qty 1
  Filled 2015-07-04 (×38): qty 2
  Filled 2015-07-04: qty 1
  Filled 2015-07-04 (×3): qty 2
  Filled 2015-07-04: qty 1
  Filled 2015-07-04 (×6): qty 2

## 2015-07-04 MED ORDER — EPHEDRINE SULFATE 50 MG/ML IJ SOLN
INTRAMUSCULAR | Status: AC
  Filled 2015-07-04: qty 1

## 2015-07-04 MED ORDER — OXYCODONE HCL 5 MG PO TABS
ORAL_TABLET | ORAL | Status: AC
  Filled 2015-07-04: qty 2

## 2015-07-04 MED ORDER — SODIUM CHLORIDE 0.9 % IJ SOLN: 9.0000 mL | INTRAMUSCULAR | Status: DC | PRN

## 2015-07-04 MED ORDER — DIPHENHYDRAMINE HCL 12.5 MG/5ML PO ELIX: 12.5000 mg | ORAL_SOLUTION | Freq: Four times a day (QID) | ORAL | Status: DC | PRN

## 2015-07-04 MED ORDER — DIPHENHYDRAMINE HCL 50 MG/ML IJ SOLN
12.5000 mg | Freq: Four times a day (QID) | INTRAMUSCULAR | Status: DC | PRN
  Administered 2015-07-05: 12.5 mg via INTRAVENOUS
  Filled 2015-07-04: qty 1

## 2015-07-04 MED ORDER — TRAMADOL HCL 50 MG PO TABS
50.0000 mg | ORAL_TABLET | Freq: Four times a day (QID) | ORAL | Status: DC | PRN
  Administered 2015-07-06 – 2015-07-07 (×3): 50 mg via ORAL
  Administered 2015-07-17 – 2015-07-20 (×5): 100 mg via ORAL
  Filled 2015-07-04 (×3): qty 2
  Filled 2015-07-04 (×2): qty 1
  Filled 2015-07-04: qty 2
  Filled 2015-07-04: qty 1
  Filled 2015-07-04: qty 2

## 2015-07-04 MED ORDER — 0.9 % SODIUM CHLORIDE (POUR BTL) OPTIME
TOPICAL | Status: DC | PRN
  Administered 2015-07-04: 3000 mL

## 2015-07-04 MED ORDER — HEMOSTATIC AGENTS (NO CHARGE) OPTIME
TOPICAL | Status: DC | PRN
  Administered 2015-07-04: 1 via TOPICAL

## 2015-07-04 MED ORDER — BUPIVACAINE HCL (PF) 0.5 % IJ SOLN
INTRAMUSCULAR | Status: DC | PRN
  Administered 2015-07-04: 10 mL

## 2015-07-04 MED ORDER — LIDOCAINE HCL (CARDIAC) 20 MG/ML IV SOLN
INTRAVENOUS | Status: AC
  Filled 2015-07-04: qty 5

## 2015-07-04 MED ORDER — HYDROMORPHONE HCL 1 MG/ML IJ SOLN
INTRAMUSCULAR | Status: AC
  Administered 2015-07-04: 0.5 mg via INTRAVENOUS
  Filled 2015-07-04: qty 2

## 2015-07-04 MED ORDER — GLYCOPYRROLATE 0.2 MG/ML IJ SOLN
INTRAMUSCULAR | Status: DC | PRN
  Administered 2015-07-04: .5 mg via INTRAVENOUS

## 2015-07-04 MED ORDER — PHENYLEPHRINE HCL 10 MG/ML IJ SOLN: 10.0000 mg | INTRAVENOUS | Status: DC | PRN

## 2015-07-04 MED ORDER — SENNOSIDES-DOCUSATE SODIUM 8.6-50 MG PO TABS
1.0000 | ORAL_TABLET | Freq: Every day | ORAL | Status: DC
  Administered 2015-07-05 – 2015-07-21 (×11): 1 via ORAL
  Filled 2015-07-04 (×22): qty 1

## 2015-07-04 MED ORDER — ARTIFICIAL TEARS OP OINT
TOPICAL_OINTMENT | OPHTHALMIC | Status: AC
  Filled 2015-07-04: qty 3.5

## 2015-07-04 MED ORDER — ONDANSETRON HCL 4 MG/2ML IJ SOLN
INTRAMUSCULAR | Status: AC
  Filled 2015-07-04: qty 2

## 2015-07-04 MED ORDER — CETYLPYRIDINIUM CHLORIDE 0.05 % MT LIQD
7.0000 mL | Freq: Two times a day (BID) | OROMUCOSAL | Status: DC
  Administered 2015-07-04 – 2015-07-21 (×29): 7 mL via OROMUCOSAL

## 2015-07-04 MED ORDER — DONEPEZIL HCL 5 MG PO TABS
5.0000 mg | ORAL_TABLET | Freq: Every evening | ORAL | Status: DC
  Administered 2015-07-04 – 2015-07-21 (×18): 5 mg via ORAL
  Filled 2015-07-04 (×21): qty 1

## 2015-07-04 MED ORDER — NALOXONE HCL 0.4 MG/ML IJ SOLN: 0.4000 mg | INTRAMUSCULAR | Status: DC | PRN

## 2015-07-04 MED ORDER — POTASSIUM CHLORIDE 10 MEQ/50ML IV SOLN
10.0000 meq | Freq: Every day | INTRAVENOUS | Status: DC | PRN
  Administered 2015-07-05 (×2): 10 meq via INTRAVENOUS

## 2015-07-04 MED ORDER — GLYCOPYRROLATE 0.2 MG/ML IJ SOLN
INTRAMUSCULAR | Status: AC
  Filled 2015-07-04: qty 3

## 2015-07-04 MED ORDER — BUPIVACAINE 0.5 % ON-Q PUMP SINGLE CATH 400 ML
INJECTION | Status: DC | PRN
  Administered 2015-07-04: 400 mL

## 2015-07-04 MED ORDER — VARENICLINE TARTRATE 1 MG PO TABS
1.0000 mg | ORAL_TABLET | Freq: Two times a day (BID) | ORAL | Status: DC
  Administered 2015-07-05 – 2015-07-22 (×30): 1 mg via ORAL
  Filled 2015-07-04 (×41): qty 1

## 2015-07-04 MED ORDER — MIDAZOLAM HCL 2 MG/2ML IJ SOLN
INTRAMUSCULAR | Status: AC
  Filled 2015-07-04: qty 4

## 2015-07-04 MED ORDER — PROMETHAZINE HCL 25 MG/ML IJ SOLN: 6.2500 mg | INTRAMUSCULAR | Status: DC | PRN

## 2015-07-04 MED ORDER — FENTANYL 40 MCG/ML IV SOLN
INTRAVENOUS | Status: DC
  Administered 2015-07-04: 10 ug via INTRAVENOUS
  Administered 2015-07-04: 13:00:00 via INTRAVENOUS
  Administered 2015-07-04: 20 ug via INTRAVENOUS
  Administered 2015-07-04: 10 ug via INTRAVENOUS
  Administered 2015-07-05: 80 ug via INTRAVENOUS
  Administered 2015-07-05: 30 ug via INTRAVENOUS
  Filled 2015-07-04 (×2): qty 25

## 2015-07-04 MED ORDER — BISACODYL 5 MG PO TBEC
10.0000 mg | DELAYED_RELEASE_TABLET | Freq: Every day | ORAL | Status: DC
  Administered 2015-07-05 – 2015-07-08 (×4): 10 mg via ORAL
  Filled 2015-07-04 (×4): qty 2

## 2015-07-04 MED ORDER — TIOTROPIUM BROMIDE MONOHYDRATE 18 MCG IN CAPS
18.0000 ug | ORAL_CAPSULE | Freq: Every day | RESPIRATORY_TRACT | Status: DC
  Administered 2015-07-05 – 2015-07-21 (×17): 18 ug via RESPIRATORY_TRACT
  Filled 2015-07-04 (×5): qty 5

## 2015-07-04 MED ORDER — ROCURONIUM BROMIDE 50 MG/5ML IV SOLN
INTRAVENOUS | Status: AC
  Filled 2015-07-04: qty 3

## 2015-07-04 MED ORDER — PHENYLEPHRINE HCL 10 MG/ML IJ SOLN
10.0000 mg | INTRAVENOUS | Status: DC | PRN
  Administered 2015-07-04: 10 ug/min via INTRAVENOUS

## 2015-07-04 MED ORDER — HYDROMORPHONE HCL 1 MG/ML IJ SOLN
INTRAMUSCULAR | Status: AC
  Administered 2015-07-04: 0.5 mg via INTRAVENOUS
  Filled 2015-07-04: qty 1

## 2015-07-04 MED ORDER — MIRTAZAPINE 7.5 MG PO TABS
7.5000 mg | ORAL_TABLET | Freq: Every day | ORAL | Status: DC
  Administered 2015-07-05 – 2015-07-21 (×17): 7.5 mg via ORAL
  Filled 2015-07-04 (×19): qty 1

## 2015-07-04 MED ORDER — PHENYLEPHRINE HCL 10 MG/ML IJ SOLN
INTRAMUSCULAR | Status: AC
  Filled 2015-07-04: qty 1

## 2015-07-04 MED ORDER — KCL IN DEXTROSE-NACL 20-5-0.45 MEQ/L-%-% IV SOLN
INTRAVENOUS | Status: DC
  Administered 2015-07-04: 100 mL/h via INTRAVENOUS
  Filled 2015-07-04 (×6): qty 1000

## 2015-07-04 MED ORDER — MIDAZOLAM HCL 5 MG/5ML IJ SOLN
INTRAMUSCULAR | Status: DC | PRN
  Administered 2015-07-04 (×2): 1 mg via INTRAVENOUS

## 2015-07-04 MED ORDER — EZETIMIBE 10 MG PO TABS
10.0000 mg | ORAL_TABLET | Freq: Every day | ORAL | Status: DC
  Administered 2015-07-05 – 2015-07-21 (×17): 10 mg via ORAL
  Filled 2015-07-04 (×25): qty 1

## 2015-07-04 MED ORDER — NIACIN ER (ANTIHYPERLIPIDEMIC) 500 MG PO TBCR
500.0000 mg | EXTENDED_RELEASE_TABLET | Freq: Every day | ORAL | Status: DC
  Filled 2015-07-04 (×3): qty 1

## 2015-07-04 MED ORDER — FENTANYL CITRATE (PF) 100 MCG/2ML IJ SOLN
INTRAMUSCULAR | Status: DC | PRN
  Administered 2015-07-04 (×3): 50 ug via INTRAVENOUS
  Administered 2015-07-04 (×3): 100 ug via INTRAVENOUS
  Administered 2015-07-04: 50 ug via INTRAVENOUS

## 2015-07-04 MED ORDER — NEOSTIGMINE METHYLSULFATE 10 MG/10ML IV SOLN
INTRAVENOUS | Status: DC | PRN
  Administered 2015-07-04: 4 mg via INTRAVENOUS

## 2015-07-04 MED ORDER — TIOTROPIUM BROMIDE MONOHYDRATE 2.5 MCG/ACT IN AERS: 2.5000 ug | INHALATION_SPRAY | Freq: Every day | RESPIRATORY_TRACT | Status: DC

## 2015-07-04 MED ORDER — LIDOCAINE HCL (CARDIAC) 20 MG/ML IV SOLN
INTRAVENOUS | Status: AC
  Filled 2015-07-04: qty 10

## 2015-07-04 MED ORDER — ACETAMINOPHEN 500 MG PO TABS
1000.0000 mg | ORAL_TABLET | Freq: Four times a day (QID) | ORAL | Status: AC
  Administered 2015-07-05 – 2015-07-09 (×14): 1000 mg via ORAL
  Filled 2015-07-04 (×17): qty 2

## 2015-07-04 MED ORDER — PROPOFOL 10 MG/ML IV BOLUS
INTRAVENOUS | Status: DC | PRN
  Administered 2015-07-04: 160 mg via INTRAVENOUS

## 2015-07-04 MED ORDER — BUPIVACAINE HCL (PF) 0.5 % IJ SOLN
INTRAMUSCULAR | Status: AC
  Filled 2015-07-04: qty 10

## 2015-07-04 MED ORDER — PROPOFOL 10 MG/ML IV BOLUS
INTRAVENOUS | Status: AC
  Filled 2015-07-04: qty 20

## 2015-07-04 MED ORDER — NEOSTIGMINE METHYLSULFATE 10 MG/10ML IV SOLN
INTRAVENOUS | Status: AC
  Filled 2015-07-04: qty 1

## 2015-07-04 MED ORDER — DEXTROSE 5 % IV SOLN
1.5000 g | Freq: Two times a day (BID) | INTRAVENOUS | Status: AC
  Administered 2015-07-04 – 2015-07-05 (×2): 1.5 g via INTRAVENOUS
  Filled 2015-07-04 (×2): qty 1.5

## 2015-07-04 SURGICAL SUPPLY — 87 items
APPLIER CLIP ROT 10 11.4 M/L (STAPLE)
APR CLP MED LRG 11.4X10 (STAPLE)
BAG SPEC RTRVL LRG 6X4 10 (ENDOMECHANICALS)
CANISTER SUCTION 2500CC (MISCELLANEOUS) ×3 IMPLANT
CATH KIT ON-Q SILVERSOAK 5IN (CATHETERS) ×3 IMPLANT
CATH THORACIC 28FR (CATHETERS) ×3 IMPLANT
CATH THORACIC 28FR RT ANG (CATHETERS) IMPLANT
CATH THORACIC 36FR (CATHETERS) IMPLANT
CATH THORACIC 36FR RT ANG (CATHETERS) IMPLANT
CLIP APPLIE ROT 10 11.4 M/L (STAPLE) IMPLANT
CLIP TI MEDIUM 6 (CLIP) ×5 IMPLANT
CONN ST 1/4X3/8  BEN (MISCELLANEOUS)
CONN ST 1/4X3/8 BEN (MISCELLANEOUS) IMPLANT
CONN Y 3/8X3/8X3/8  BEN (MISCELLANEOUS)
CONN Y 3/8X3/8X3/8 BEN (MISCELLANEOUS) IMPLANT
CONT SPEC 4OZ CLIKSEAL STRL BL (MISCELLANEOUS) ×6 IMPLANT
DRAPE LAPAROSCOPIC ABDOMINAL (DRAPES) ×3 IMPLANT
DRAPE WARM FLUID 44X44 (DRAPE) ×3 IMPLANT
ELECT BLADE 6.5 EXT (BLADE) ×3 IMPLANT
ELECT REM PT RETURN 9FT ADLT (ELECTROSURGICAL) ×3
ELECTRODE REM PT RTRN 9FT ADLT (ELECTROSURGICAL) ×1 IMPLANT
GAUZE SPONGE 4X4 12PLY STRL (GAUZE/BANDAGES/DRESSINGS) ×3 IMPLANT
GLOVE BIO SURGEON STRL SZ 6 (GLOVE) ×2 IMPLANT
GLOVE BIO SURGEON STRL SZ 6.5 (GLOVE) ×2 IMPLANT
GLOVE BIO SURGEONS STRL SZ 6.5 (GLOVE) ×1
GLOVE BIOGEL PI IND STRL 6.5 (GLOVE) IMPLANT
GLOVE BIOGEL PI IND STRL 7.0 (GLOVE) ×2 IMPLANT
GLOVE BIOGEL PI INDICATOR 6.5 (GLOVE) ×2
GLOVE BIOGEL PI INDICATOR 7.0 (GLOVE) ×4
GLOVE SURG SIGNA 7.5 PF LTX (GLOVE) ×6 IMPLANT
GOWN STRL REUS W/ TWL LRG LVL3 (GOWN DISPOSABLE) ×2 IMPLANT
GOWN STRL REUS W/ TWL XL LVL3 (GOWN DISPOSABLE) ×1 IMPLANT
GOWN STRL REUS W/TWL LRG LVL3 (GOWN DISPOSABLE) ×6
GOWN STRL REUS W/TWL XL LVL3 (GOWN DISPOSABLE) ×3
HEMOSTAT SURGICEL 2X14 (HEMOSTASIS) ×3 IMPLANT
KIT BASIN OR (CUSTOM PROCEDURE TRAY) ×3 IMPLANT
KIT ROOM TURNOVER OR (KITS) ×3 IMPLANT
KIT SUCTION CATH 14FR (SUCTIONS) IMPLANT
LIQUID BAND (GAUZE/BANDAGES/DRESSINGS) ×2 IMPLANT
NS IRRIG 1000ML POUR BTL (IV SOLUTION) ×9 IMPLANT
PACK CHEST (CUSTOM PROCEDURE TRAY) ×3 IMPLANT
PAD ARMBOARD 7.5X6 YLW CONV (MISCELLANEOUS) ×6 IMPLANT
POUCH ENDO CATCH II 15MM (MISCELLANEOUS) ×2 IMPLANT
POUCH SPECIMEN RETRIEVAL 10MM (ENDOMECHANICALS) IMPLANT
RELOAD GOLD ECHELON 45 (STAPLE) ×12 IMPLANT
RELOAD GREEN ECHELON 45 (STAPLE) ×2 IMPLANT
RELOAD STAPLE 35X2.5 WHT THIN (STAPLE) IMPLANT
SCISSORS ENDO CVD 5DCS (MISCELLANEOUS) IMPLANT
SEALANT PROGEL (MISCELLANEOUS) ×3 IMPLANT
SEALANT SURG COSEAL 4ML (VASCULAR PRODUCTS) IMPLANT
SEALANT SURG COSEAL 8ML (VASCULAR PRODUCTS) IMPLANT
SOLUTION ANTI FOG 6CC (MISCELLANEOUS) ×3 IMPLANT
SPECIMEN JAR MEDIUM (MISCELLANEOUS) IMPLANT
SPONGE GAUZE 4X4 12PLY STER LF (GAUZE/BANDAGES/DRESSINGS) ×2 IMPLANT
SPONGE INTESTINAL PEANUT (DISPOSABLE) IMPLANT
SPONGE TONSIL 1 RF SGL (DISPOSABLE) ×3 IMPLANT
STAPLE RELOAD 2.5MM WHITE (STAPLE) ×15 IMPLANT
STAPLER ECHELON POWERED (MISCELLANEOUS) ×2 IMPLANT
STAPLER VASCULAR ECHELON 35 (CUTTER) ×2 IMPLANT
SUT PROLENE 4 0 RB 1 (SUTURE)
SUT PROLENE 4-0 RB1 .5 CRCL 36 (SUTURE) IMPLANT
SUT SILK  1 MH (SUTURE) ×4
SUT SILK 1 MH (SUTURE) ×2 IMPLANT
SUT SILK 1 TIES 10X30 (SUTURE) ×3 IMPLANT
SUT SILK 2 0 SH (SUTURE) ×2 IMPLANT
SUT SILK 2 0SH CR/8 30 (SUTURE) IMPLANT
SUT SILK 3 0 SH 30 (SUTURE) IMPLANT
SUT SILK 3 0SH CR/8 30 (SUTURE) IMPLANT
SUT VIC AB 1 CTX 36 (SUTURE) ×3
SUT VIC AB 1 CTX36XBRD ANBCTR (SUTURE) ×1 IMPLANT
SUT VIC AB 2-0 CTX 36 (SUTURE) ×3 IMPLANT
SUT VIC AB 2-0 UR6 27 (SUTURE) IMPLANT
SUT VIC AB 3-0 MH 27 (SUTURE) ×6 IMPLANT
SUT VIC AB 3-0 SH 27 (SUTURE) ×6
SUT VIC AB 3-0 SH 27X BRD (SUTURE) ×2 IMPLANT
SUT VIC AB 3-0 X1 27 (SUTURE) ×3 IMPLANT
SYR CONTROL 10ML LL (SYRINGE) ×2 IMPLANT
SYSTEM SAHARA CHEST DRAIN ATS (WOUND CARE) ×3 IMPLANT
TAPE CLOTH 4X10 WHT NS (GAUZE/BANDAGES/DRESSINGS) ×3 IMPLANT
TAPE CLOTH SURG 6X10 WHT LF (GAUZE/BANDAGES/DRESSINGS) ×2 IMPLANT
TIP APPLICATOR SPRAY EXTEND 16 (VASCULAR PRODUCTS) IMPLANT
TOWEL OR 17X26 10 PK STRL BLUE (TOWEL DISPOSABLE) ×3 IMPLANT
TRAY FOLEY CATH 16FRSI W/METER (SET/KITS/TRAYS/PACK) ×3 IMPLANT
TROCAR XCEL BLADELESS 5X75MML (TROCAR) ×3 IMPLANT
TUNNELER SHEATH ON-Q 11GX8 DSP (PAIN MANAGEMENT) ×2 IMPLANT
WATER STERILE IRR 1000ML POUR (IV SOLUTION) ×3 IMPLANT
YANKAUER SUCT BULB TIP NO VENT (SUCTIONS) ×3 IMPLANT

## 2015-07-04 NOTE — Progress Notes (Signed)
[facility] BRIEF SICU PROGRESS NOTE  Day of Surgery  S/P Procedure(s) (LRB): VIDEO ASSISTED THORACOSCOPY (VATS) LEFT UPPER LOBECTOMY (Left)   Stable post op Good analgesia NSR w/ stable BP O2 sats 99% on 2 L/min Chest tube output low, + small air leak UOP adequate  Plan: Continue routine early postop  [name], MD 07/04/2015 7:02 PM

## 2015-07-04 NOTE — Anesthesia Procedure Notes (Signed)
Procedure Name: Intubation Date/Time: 07/04/2015 7:31 AM Performed by: Maude Leriche D Pre-anesthesia Checklist: Patient identified, Emergency Drugs available, Suction available, Patient being monitored and Timeout performed Patient Re-evaluated:Patient Re-evaluated prior to inductionOxygen Delivery Method: Circle system utilized Preoxygenation: Pre-oxygenation with 100% oxygen Intubation Type: IV induction Ventilation: Mask ventilation without difficulty and Oral airway inserted - appropriate to patient size Laryngoscope Size: Mac and 3 Grade View: Grade I Tube type: Oral Endobronchial tube: Left, Double lumen EBT and EBT position confirmed by fiberoptic bronchoscope and 37 Fr Number of attempts: 1 Airway Equipment and Method: Stylet Placement Confirmation: ETT inserted through vocal cords under direct vision,  positive ETCO2 and breath sounds checked- equal and bilateral Secured at: 30 cm Tube secured with: Tape Dental Injury: Teeth and Oropharynx as per pre-operative assessment

## 2015-07-04 NOTE — Brief Op Note (Addendum)
07/04/2015  10:27 AM  PATIENT:  [name]  74 y.o. female  PRE-OPERATIVE DIAGNOSIS:  LEFT UPPER LOBE NODULE  POST-OPERATIVE DIAGNOSIS:  ADENOCARCINOMA LEFT UPPER LOBE, CLINICAL STAGE IA  PROCEDURE:   LEFT VIDEO ASSISTED THORACOSCOPY  THORACOSCOPIC LEFT UPPER LOBECTOMY  MEDIASTINAL LYMPH NODE DISSECTION  On-Q LOCAL ANESTHETIC CATHETER PLACEMENT  SURGEON:  Melrose Nakayama, MD  ASSISTANT: Suzzanne Cloud, PA-C  ANESTHESIA:   general  SPECIMEN:  Source of Specimen:  Left upper lobe, mediastinal lymph nodes  DISPOSITION OF SPECIMEN:  Pathology  DRAINS: 28 Fr CT, 28 Blake drain  PATIENT CONDITION:  PACU - hemodynamically stable.  Findings- 1.5 cm mass in LUL close to a LUL PA branch- Frozen= nonsmall cell carcinoma. Margin clear.

## 2015-07-04 NOTE — Anesthesia Postprocedure Evaluation (Signed)
Anesthesia Post Note  Patient: [name]  Procedure(s) Performed: Procedure(s) (LRB): VIDEO ASSISTED THORACOSCOPY (VATS) LEFT UPPER LOBECTOMY (Left)  Anesthesia type: general  Patient location: PACU  Post pain: Pain level controlled  Post assessment: Patient's Cardiovascular Status Stable  Last Vitals:  Filed Vitals:   07/04/15 1120  BP: 123/92  Pulse: 93  Temp:   Resp: 19    Post vital signs: Reviewed and stable  Level of consciousness: sedated  Complications: No apparent anesthesia complications

## 2015-07-04 NOTE — Interval H&P Note (Signed)
History and Physical Interval Note:  07/04/2015 7:18 AM  [name]  has presented today for surgery, with the diagnosis of LUL NODULE  The various methods of treatment have been discussed with the patient and family. After consideration of risks, benefits and other options for treatment, the patient has consented to  Procedure(s): VIDEO ASSISTED THORACOSCOPY (VATS)/ LOBECTOMY (Left) as a surgical intervention .  The patient's history has been reviewed, patient examined, no change in status, stable for surgery.  I have reviewed the patient's chart and labs.  Questions were answered to the patient's satisfaction.     Melrose Nakayama

## 2015-07-04 NOTE — Anesthesia Preprocedure Evaluation (Addendum)
Anesthesia Evaluation  Patient identified by MRN, date of birth, ID band Patient awake    Reviewed: Allergy & Precautions, NPO status , Patient's Chart, lab work & pertinent test results  Airway Mallampati: II  TM Distance: >3 FB Neck ROM: Full    Dental  (+) Edentulous Upper, Edentulous Lower, Dental Advisory Given   Pulmonary asthma , COPD, Current Smoker,    Pulmonary exam normal        Cardiovascular negative cardio ROS Normal cardiovascular exam     Neuro/Psych negative neurological ROS     GI/Hepatic Neg liver ROS, GERD  ,  Endo/Other  negative endocrine ROS  Renal/GU negative Renal ROS     Musculoskeletal   Abdominal   Peds  Hematology negative hematology ROS (+)   Anesthesia Other Findings   Reproductive/Obstetrics                            Anesthesia Physical  Anesthesia Plan  ASA: III  Anesthesia Plan: General   Post-op Pain Management:    Induction: Intravenous  Airway Management Planned: Oral ETT  Additional Equipment: Arterial line  Intra-op Plan:   Post-operative Plan: Extubation in OR  Informed Consent: I have reviewed the patients History and Physical, chart, labs and discussed the procedure including the risks, benefits and alternatives for the proposed anesthesia with the patient or authorized representative who has indicated his/her understanding and acceptance.   Dental advisory given  Plan Discussed with: CRNA and Anesthesiologist  Anesthesia Plan Comments:        Anesthesia Quick Evaluation

## 2015-07-04 NOTE — H&P (View-Only) (Signed)
PCP is Maximino Greenland, MD Referring Provider is Rigoberto Noel, MD  Chief Complaint  Patient presents with  . Lung Lesion    LULobe...CT CHEST 05/26/15, PET 06/05/15, PFT 06/09/15    HPI: Mrs. Givhan is sent for consultation regarding a left upper lobe lung nodule.  Mrs. Derks is a 74 year old woman with a greater than 50-pack-year history of tobacco abuse and COPD who had a groundglass opacity noted in her left upper lobe on a low-dose screening CT scan done in March 2015. She had been followed since that time with no significant change in the nodule until her CT scan in September. On that scan it was noted that the nodule was larger and more solid. She saw Dr. Elsworth Soho. A PET CT showed the nodule was hypermetabolic. There was no hilar or mediastinal adenopathy and no evidence of distant metastasis.  She smoked about a pack a day for most of her adult life starting about 50 years ago. She is now down to 3-4 cigarettes per day. She has started on Chantix and today is her target quit date. She says her appetite is good. She has not had any significant weight loss. She has a chronic productive cough of clear sputum. She denies hemoptysis. She does wheeze fairly regular basis. She's not had any unusual headaches or visual changes. She has arthritis, but no new or unusual pain related to that.  Zubrod Score: At the time of surgery this patient's most appropriate activity status/level should be described as: '[x]'$     0    Normal activity, no symptoms '[]'$     1    Restricted in physical strenuous activity but ambulatory, able to do out light work '[]'$     2    Ambulatory and capable of self care, unable to do work activities, up and about >50 % of waking hours                              '[]'$     3    Only limited self care, in bed greater than 50% of waking hours '[]'$     4    Completely disabled, no self care, confined to bed or chair '[]'$     5    Moribund   Past Medical History  Diagnosis Date  .  Hyperlipidemia   . Asthma   . COPD (chronic obstructive pulmonary disease) ([place])   . Shortness of breath   . GERD (gastroesophageal reflux disease)     Past Surgical History  Procedure Laterality Date  . Cesarean section    . Cataract extraction      left eye  . Microlaryngoscopy N/A 12/13/2014    Procedure: MICROLARYNGOSCOPY;  Surgeon: Jerrell Belfast, MD;  Location: [facility];  Service: ENT;  Laterality: N/A;  . Mass excision N/A 12/13/2014    Procedure: EXCISION OF VOCAL CORD POLYPS;  Surgeon: Jerrell Belfast, MD;  Location: [facility] OR;  Service: ENT;  Laterality: N/A;    Family History  Problem Relation Age of Onset  . Renal cancer Brother   . Diabetes Mother   . Hypertension Mother   . Renal Disease Mother     Social History Social History  Substance Use Topics  . Smoking status: Current Every Day Smoker -- 1.00 packs/day for 54 years    Types: Cigarettes  . Smokeless tobacco: Never Used     Comment: Currently smoking 5-6 cigarettes per day.  . Alcohol Use:  No    Current Outpatient Prescriptions  Medication Sig Dispense Refill  . aspirin 81 MG tablet Take 81 mg by mouth daily.    Hendricks Limes CONTINUING MONTH PAK 1 MG tablet Take 1 tablet (1 mg total) by mouth 2 (two) times daily. 60 tablet 2  . ezetimibe (ZETIA) 10 MG tablet Take 10 mg by mouth at bedtime.     . mirtazapine (REMERON) 15 MG tablet Take 7.5 mg by mouth at bedtime.   0  . niacin (NIASPAN) 500 MG CR tablet Take 500 mg by mouth at bedtime.     . pantoprazole (PROTONIX) 40 MG tablet Take 40 mg by mouth daily.     Marland Kitchen SPIRIVA RESPIMAT 2.5 MCG/ACT AERS INHALE 2 PUFFS INTO THE LUNGS DAILY. 4 g 3  . valACYclovir (VALTREX) 500 MG tablet Take 500 mg by mouth daily.    Penne Lash HFA 45 MCG/ACT inhaler Inhale 2 puffs into the lungs every 4 (four) hours as needed for wheezing or shortness of breath.   1   No current facility-administered medications for this visit.    Allergies  Allergen Reactions  . Albuterol Shortness Of  Breath    Review of Systems  Constitutional: Positive for activity change and fatigue. Negative for chills, appetite change and unexpected weight change.  HENT: Positive for dental problem (Dentures).   Eyes: Negative for visual disturbance.       Has had cataracts removed  Respiratory: Positive for cough (productive of clear sputum, denies hemoptysis), shortness of breath (With exertion, walking one fourth of a mile on level ground 2 flights of stairs) and wheezing.   Gastrointestinal: Positive for constipation. Negative for blood in stool.       Reflux  Genitourinary: Negative for hematuria and difficulty urinating.  Musculoskeletal: Positive for joint swelling and arthralgias.  Neurological: Negative for seizures, syncope, weakness and headaches.  Hematological: Negative for adenopathy. Does not bruise/bleed easily.  All other systems reviewed and are negative.   BP 157/92 mmHg  Pulse 71  Resp 16  Ht '5\' 3"'$  (1.6 m)  Wt 132 lb (59.875 kg)  BMI 23.39 kg/m2  SpO2 96% Physical Exam  Constitutional: She is oriented to person, place, and time. She appears well-developed and well-nourished. No distress.  HENT:  Head: Normocephalic and atraumatic.  Mouth/Throat: No oropharyngeal exudate.  Eyes: Conjunctivae and EOM are normal. No scleral icterus.  Neck: Neck supple. No thyromegaly present.  Cardiovascular: Normal rate, regular rhythm, normal heart sounds and intact distal pulses.  Exam reveals no gallop and no friction rub.   No murmur heard. Pulmonary/Chest: Effort normal and breath sounds normal. No respiratory distress. She has no wheezes.  Abdominal: Soft. Bowel sounds are normal. She exhibits no distension. There is no tenderness.  Musculoskeletal: She exhibits no edema.  Lymphadenopathy:    She has no cervical adenopathy.  Neurological: She is alert and oriented to person, place, and time. No cranial nerve deficit.  No motor deficit  Skin: Skin is warm and dry.  Vitals  reviewed.    Diagnostic Tests: CT CHEST WITHOUT CONTRAST  TECHNIQUE: Multidetector CT imaging of the chest was performed following the standard protocol without IV contrast.  COMPARISON: CT scan of May 24, 2014.  FINDINGS: Stable large right thyroid nodule is noted. This has been biopsied previously. Atherosclerosis of thoracic aorta is noted without aneurysm formation. No significant mediastinal adenopathy is noted. Mild coronary artery calcifications are noted. Visualized portion of upper abdomen is unremarkable. No pneumothorax or pleural  effusion is noted the nodular density seen in the posterior portion the left upper lobe on prior exam is again noted and appears to be enlarged. It currently measures 12 x 11 x 9 mm in size, and appears more solid compared to prior exam. This is concerning for malignancy. Right lung is clear.  IMPRESSION: Stable right thyroid nodule as described on prior exam.  Nodular density seen posteriorly in the left upper lobe does appear to have increased in size compared to prior exam, now measuring 12 x 11 x 9 mm. It also appears to be more solid in appearance. This is concerning for neoplasm or malignancy, and further evaluation with PET scan or surgical biopsy is recommended. These results will be called to the ordering clinician or representative by the Radiologist Assistant, and communication documented in the PACS or zVision Dashboard.   Electronically Signed  By: Marijo Conception, M.D.  On: 05/26/2015 15:20 NUCLEAR MEDICINE PET SKULL BASE TO THIGH  TECHNIQUE: 6.3 mCi F-18 FDG was injected intravenously. Full-ring PET imaging was performed from the skull base to thigh after the radiotracer. CT data was obtained and used for attenuation correction and anatomic localization.  FASTING BLOOD GLUCOSE: Value: 115 mg/dl  COMPARISON: Chest CT on 05/26/2015  FINDINGS: NECK  No hypermetabolic lymph nodes in the  neck.  CHEST  No hypermetabolic mediastinal or hilar nodes. Mild goiter is seen which shows diffuse hypermetabolic activity, suspicious for thyroiditis.  11 mm pulmonary nodule in the central left upper lobe is hypermetabolic, with SUV max of 5.4. This is suspicious for bronchogenic carcinoma. No other suspicious pulmonary nodules or masses identified. Mild emphysema noted. No evidence of pleural effusion.  ABDOMEN/PELVIS  No abnormal hypermetabolic activity within the liver, pancreas, adrenal glands, or spleen. No hypermetabolic lymph nodes in the abdomen or pelvis.  SKELETON  No focal hypermetabolic activity to suggest skeletal metastasis.  IMPRESSION: Hypermetabolic 11 mm central left upper lobe pulmonary nodule, suspicious for bronchogenic carcinoma.  No evidence of thoracic nodal or distant metastatic disease.  Mildly enlarged thyroid with diffuse hypermetabolic activity, suspicious for thyroiditis. Suggest correlation with thyroid function tests.   Electronically Signed  By: Earle Gell M.D.  On: 06/05/2015 11:35  Pulmonary function testing FVC 2.04 (93%) FEV1 1.22 (72%), no change with bronchodilators FEV1/FVC 60% DLCO 11.34 (48%)  I personally reviewed the CT and PET CT images and agree with the findings as noted above.  Impression: Mrs. Bodiford is a 74 year old woman history of tobacco abuse who has a suspicious left upper lobe nodule. This was first noted on a low-dose screening CT back in March 2015. It there remained a stable groundglass opacity over multiple follow-up studies until a CT in September 2016 which showed the nodule was larger and more solid. A PET CT showed the nodule was hypermetabolic and the malignant range with an SUV of 5.4. Given her age, smoking history, and appearance of the nodule on imaging this almost certainly represents a new primary bronchogenic carcinoma and has to be considered that unless it can be proven  otherwise.  The nodule is too central and closely related to a pulmonary artery branch to consider CT-guided biopsy. I do not think the angle is favorable for a bronchoscopic biopsy either.   I recommended to Mrs. Dufford that we proceed with left VATS, possible wedge resection, possible upper lobectomy for definitive diagnosis and treatment of the nodule. The nodule is relatively central and close to the pulmonary artery branches I am not sure a. wedge  resection will be feasible. More likely will have to go ahead and do a lobectomy from the beginning.  I described the general nature of the procedure to Mrs. Haub and her nephew. I reviewed the need for general anesthesia, the incisions to be used, the minimally invasive approach, the expected hospital stay and overall recovery. I reviewed the indications, risks, benefits, and alternatives. She understands the risks include, but are not limited to death, MI, DVT, PE, bleeding, possible need for transfusion, infection, prolonged air leak, cardiac arrhythmias, as well as the possibility of other unforeseeable complications. She understands that no guarantee can be given another cure, but surgery would offer her the best chance for cure.  She wishes to proceed with surgery. She needs to check with her family see when she will have help at home before scheduling the procedure.  Plan: Left VATS, possible wedge resection, possible left upper lobectomy on the date to be determined. Patient will call to schedule.  Melrose Nakayama, MD [facility] [phone]

## 2015-07-04 NOTE — Transfer of Care (Signed)
Immediate Anesthesia Transfer of Care Note  Patient: [name]  Procedure(s) Performed: Procedure(s): VIDEO ASSISTED THORACOSCOPY (VATS) LEFT UPPER LOBECTOMY (Left)  Patient Location: PACU  Anesthesia Type:General  Level of Consciousness: awake  Airway & Oxygen Therapy: Patient Spontanous Breathing and Patient connected to face mask oxygen  Post-op Assessment: Report given to RN and Post -op Vital signs reviewed and stable  Post vital signs: Reviewed and stable  Last Vitals:  Filed Vitals:   07/04/15 0605  BP: 148/77  Pulse: 89  Temp: 36.4 C  Resp: 18    Complications: No apparent anesthesia complications

## 2015-07-05 ENCOUNTER — Inpatient Hospital Stay (HOSPITAL_COMMUNITY): Payer: Commercial Managed Care - HMO

## 2015-07-05 LAB — CK TOTAL AND CKMB (NOT AT ARMC)
CK TOTAL: 824 U/L — AB (ref 38–234)
CK, MB: 7.5 ng/mL — AB (ref 0.5–5.0)
RELATIVE INDEX: 0.9 (ref 0.0–2.5)

## 2015-07-05 LAB — BASIC METABOLIC PANEL
Anion gap: 8 (ref 5–15)
BUN: 8 mg/dL (ref 6–20)
CALCIUM: 8.4 mg/dL — AB (ref 8.9–10.3)
CHLORIDE: 105 mmol/L (ref 101–111)
CO2: 26 mmol/L (ref 22–32)
CREATININE: 0.84 mg/dL (ref 0.44–1.00)
Glucose, Bld: 135 mg/dL — ABNORMAL HIGH (ref 65–99)
Potassium: 3.7 mmol/L (ref 3.5–5.1)
SODIUM: 139 mmol/L (ref 135–145)

## 2015-07-05 LAB — CBC
HCT: 36.4 % (ref 36.0–46.0)
HEMOGLOBIN: 12.4 g/dL (ref 12.0–15.0)
MCH: 33.7 pg (ref 26.0–34.0)
MCHC: 34.1 g/dL (ref 30.0–36.0)
MCV: 98.9 fL (ref 78.0–100.0)
PLATELETS: 183 10*3/uL (ref 150–400)
RBC: 3.68 MIL/uL — ABNORMAL LOW (ref 3.87–5.11)
RDW: 14.8 % (ref 11.5–15.5)
WBC: 8 10*3/uL (ref 4.0–10.5)

## 2015-07-05 LAB — TROPONIN I: Troponin I: <0.03 ng/mL (ref <0.031)

## 2015-07-05 LAB — POCT I-STAT 3, ART BLOOD GAS (G3+)
ACID-BASE EXCESS: 1 mmol/L (ref 0.0–2.0)
BICARBONATE: 26.7 meq/L — AB (ref 20.0–24.0)
O2 Saturation: 96 %
PH ART: 7.38 (ref 7.350–7.450)
TCO2: 28 mmol/L (ref 0–100)
pCO2 arterial: 44.9 mmHg (ref 35.0–45.0)
pO2, Arterial: 85 mmHg (ref 80.0–100.0)

## 2015-07-05 MED ORDER — POTASSIUM CHLORIDE 10 MEQ/100ML IV SOLN
10.0000 meq | Freq: Once | INTRAVENOUS | Status: AC
  Administered 2015-07-05: 10 meq via INTRAVENOUS
  Filled 2015-07-05: qty 100

## 2015-07-05 MED ORDER — DOPAMINE-DEXTROSE 3.2-5 MG/ML-% IV SOLN
INTRAVENOUS | Status: AC
  Filled 2015-07-05: qty 250

## 2015-07-05 MED ORDER — ALBUMIN HUMAN 5 % IV SOLN
INTRAVENOUS | Status: AC
  Administered 2015-07-05: 12.5 g
  Filled 2015-07-05: qty 250

## 2015-07-05 MED ORDER — ATROPINE SULFATE 0.1 MG/ML IJ SOLN
1.0000 mg | Freq: Once | INTRAMUSCULAR | Status: AC
  Administered 2015-07-05: 1 mg via INTRAVENOUS

## 2015-07-05 MED ORDER — ALBUMIN HUMAN 5 % IV SOLN: 12.5000 g | Freq: Once | INTRAVENOUS | Status: AC

## 2015-07-05 MED ORDER — FENTANYL CITRATE (PF) 100 MCG/2ML IJ SOLN
12.5000 ug | INTRAMUSCULAR | Status: DC | PRN
  Administered 2015-07-08 – 2015-07-09 (×2): 12.5 ug via INTRAVENOUS
  Filled 2015-07-05 (×3): qty 2

## 2015-07-05 NOTE — Progress Notes (Signed)
TCTS BRIEF SICU PROGRESS NOTE  1 Day Post-Op  S/P Procedure(s) (LRB): VIDEO ASSISTED THORACOSCOPY (VATS) LEFT UPPER LOBECTOMY (Left)   Stable day No further episodes of nausea or bradycardia O2 sats 97-100% on 2 L/min NSR w/ stable BP Excellent UOP  Plan: Continue current plan  [name], MD 07/05/2015 5:01 PM

## 2015-07-05 NOTE — Progress Notes (Signed)
19 mL Fentanyl PCA wasted in sink. Witnessed by Tamera Punt, RN.

## 2015-07-05 NOTE — Progress Notes (Signed)
      [address]       [place], [address]             [phone]        CARDIOTHORACIC SURGERY PROGRESS NOTE   R1 Day Post-Op Procedure(s) (LRB): VIDEO ASSISTED THORACOSCOPY (VATS) LEFT UPPER LOBECTOMY (Left)  Subjective: Patient reportedly had increased chest wall pain overnight while sitting up in chair, some increased use of PCA, followed by episode of severe nausea associated with transient bradycardia - on telemetry period of profound sinus bradycardia and junctional rhythm HR 20-40 - given 1 dose atropine and returned to NSR.  Currently looks good.  Feels "sore all over" but denies substernal chest pain/pressure.  No SOB  Objective: Vital signs: BP Readings from Last 1 Encounters:  07/05/15 81/56   Pulse Readings from Last 1 Encounters:  07/05/15 80   Resp Readings from Last 1 Encounters:  07/05/15 21   Temp Readings from Last 1 Encounters:  07/04/15 99.1 F (37.3 C) Oral    Hemodynamics:    Physical Exam:  Rhythm:   sinus  Breath sounds: clear  Heart sounds:  RRR  Incisions:  Dressing dry, intact  Abdomen:  Soft, non-distended, non-tender  Extremities:  Warm, well-perfused  Chest tubes:  Low volume thin serosanguinous output, + small air leak    Intake/Output from previous day: 10/28 0701 - 10/29 0700 In: 3575 [I.V.:3475; IV Piggyback:100] Out: 1705 [Urine:1065; Blood:100; Chest Tube:540] Intake/Output this shift:    Lab Results:  CBC: Recent Labs  07/02/15 1356 07/05/15 0409  WBC 5.1 8.0  HGB 14.1 12.4  HCT 41.4 36.4  PLT 230 183    BMET:  Recent Labs  07/02/15 1356 07/05/15 0409  NA 143 139  K 3.9 3.7  CL 109 105  CO2 23 26  GLUCOSE 121* 135*  BUN 10 8  CREATININE 0.94 0.84  CALCIUM 9.5 8.4*     PT/INR:   Recent Labs  07/02/15 1356  LABPROT 13.6  INR 1.02    CBG (last 3)  No results for input(s): GLUCAP in the last 72 hours.  ABG    Component Value Date/Time   PHART 7.380 07/05/2015 0333   PCO2ART 44.9 07/05/2015 0333   PO2ART 85.0 07/05/2015 0333   HCO3 26.7* 07/05/2015 0333   TCO2 28 07/05/2015 0333   O2SAT 96.0 07/05/2015 0333    CXR: PORTABLE CHEST 1 VIEW  COMPARISON: 07/04/2015  FINDINGS: Left chest tubes in good position with no pneumothorax. Slight new atelectasis at the left base medially. Right lung is clear. Heart size and vascularity are normal.  IMPRESSION: New slight atelectasis at the left lung base. No pneumothorax.   Electronically Signed  By: Lorriane Shire M.D.  On: 07/05/2015 08:00   EKG: NSR w/out acute ischemic changes    Assessment/Plan: S/P Procedure(s) (LRB): VIDEO ASSISTED THORACOSCOPY (VATS) LEFT UPPER LOBECTOMY (Left)  Episode transient bradycardia/hypotension early this morning associated w/ severe nausea while sitting up in chair - likely vasovagal event EKG w/out ischemic change, Troponin pending Otherwise stable POD1 CXR looks good, small air leak in tubes   Monitor closely in ICU and f/u Troponin  Stop PCA and use oral and/or intravenous pain meds per RN discretion  D/C Aline and mobilize  Pulm toilet   [name], MD 07/05/2015 8:18 AM

## 2015-07-05 NOTE — Op Note (Signed)
[name], [name] NO.:  [id number]  MEDICAL RECORD NO.:  [id number]  LOCATION:  2S16C                        FACILITY:  [facility]  PHYSICIAN:  Revonda Standard. Roxan Hockey, M.D.DATE OF BIRTH:  Aug 23, 1941  DATE OF PROCEDURE:  07/04/2015 DATE OF DISCHARGE:                              OPERATIVE REPORT   PREOPERATIVE DIAGNOSIS:  Left upper lobe nodule.  POSTOPERATIVE DIAGNOSIS:  Non-small cell carcinoma, left upper lobe,  clinical stage IA.  PROCEDURE: Left video-assisted thoracoscopy Thoracoscopic left upper lobectomy Mediastinal lymph node dissection On-Q local anesthetic catheter placement.  SURGEON:  Revonda Standard. Roxan Hockey, M.D.  ASSISTANT:  Suzzanne Cloud, PA-C.  ANESTHESIA:  General.  FINDINGS:  1.5 cm mass in left upper lobe adjacent to fissure, not amenable to wedge resection.  Frozen section revealed non-small cell carcinoma, bronchial margin free of disease.  CLINICAL NOTE:  [name] is a 74 year old woman with a history of tobacco abuse and chronic obstructive pulmonary disease.  She had a low- dose screening CT in March, 2015 which showed a ground-glass opacity in the left upper lobe.  Over time, this had been followed and now has progressed to where it appears more solid and has increased in size. She was advised to undergo surgical resection for definitive diagnosis and treatment.  The indications, risks, benefits, and alternatives were discussed in detail with the patient.  She understood and accepted the risks and agreed to proceed.  OPERATIVE NOTE:  [name] was brought to the preoperative holding area on July 04, 2015.  Anesthesia placed a central line and arterial blood pressure monitoring line.  She was taken to the operating room, anesthetized, and intubated with a double-lumen endotracheal tube. Intravenous antibiotics were administered.  A Foley catheter was placed. Sequential compression devices were placed on the calves for  DVT prophylaxis.  She was placed in a right lateral decubitus position, and the left chest was prepped and draped in the usual sterile fashion.  Single lung ventilation in the right lung was initiated, and was tolerated well throughout the procedure.  An incision made in the seventh intercostal space in the midaxillary line.  A 5 mm port was inserted into the chest, and the thoracoscope was advanced into the chest.  There was good isolation of the left lung. There was a good fissure anteriorly.  An incision was made in the third interspace anterolaterally 5 cm in length.  No rib spreading was performed during the procedure.  A second port incision was made adjacent to the first and slightly anterior for instrumentation.  The mass was clearly palpable in the fissure.  It was very close to the fissure.  This lesion was not amenable to a wedge resection.  The appearance was of a lung cancer with involvement of visceral pleura.  Decision was made to proceed with lobectomy as discussed with the patient preoperatively. The pleural reflection was divided at the hilum anteriorly.  The inferior ligament was divided.  The fissure was completed between the lingula and the left lower lobe with cautery.  The anterior branch of the left lower lobe pulmonary artery was identified.  There was a small branch to the lingula coming off this vessel.  As the dissection was carried more posteriorly, there were two large lingular branches that arose with a common trunk.  The lymph nodes were dissected from around these vessels, and all lymph nodes that were removed were sent for permanent Pathology as separate specimens.  The lingular branches then were encircled with an endoscopic vascular stapler and divided.  Next, attention was turned anteriorly, the superior pulmonary vein was identified.  It was dissected out, encircled, and divided with endoscopic vascular stapler.  The pleural reflection was  divided superiorly.  The main pulmonary artery was identified, and the anterior, apical and posterior left upper lobe branches were dissected out.  The anterior and apical branches were divided with the endoscopic vascular stapler.  The posterior branch could not be divided at this time.  The dissection was taken back to the fissure, and the upper and lower lobes were divided completing the fissure with the endoscopic stapler along the plane over the main pulmonary artery.  After doing this, the posterior branch was easily encircled and divided with endoscopic vascular stapler.  An Endo-GIA stapler with a green cartridge then was placed across the left upper lobe bronchus and closed.  A test inflation showed good aeration of the lower lobe.  The stapler was fired transecting the left upper lobe bronchus.  The left upper lobe was placed into an endoscopic retrieval bag, removed through the incision and sent for frozen section of the mass and margins.  Frozen section of the mass revealed non-small cell carcinoma.  The margin was free of tumor.  The aortopulmonary window was explored.  Multiple nodes were removed and sent for pathology.  The subcarinal space was explored. There was a relatively large but otherwise benign-appearing node and a smaller node adjacent to it.  These were both removed and sent separately.  Chest was copiously irrigated with warm saline.  A test inflation showed an air leak in the fissure near the superior segment of the lower lobe.  A 3-0 Vicryl suture was placed, and then Progel was applied over this area, and the air leak improved.  An On-Q local anesthetic catheter was placed through a separate stab incision posteriorly and tunneled in a subpleural location.  A 28-French Blake drain was placed through the original port incision.  A 28-French chest tube was placed through the more anterior port incision.  Both were secured with #1 silk sutures.  The left lower lobe  was reinflated.  The working incision was closed with a running #1 Vicryl fascia suture, a 2- 0 Vicryl subcutaneous suture, and a 3-0 Vicryl subcuticular suture.  All sponge, needle, and instrument counts were correct at the end of the procedure.  The patient was taken from the operating room to the postanesthetic care unit, extubated and in good condition.     Revonda Standard Roxan Hockey, M.D.     SCH/MEDQ  D:  07/04/2015  T:  07/05/2015  Job:  [id number]

## 2015-07-06 ENCOUNTER — Inpatient Hospital Stay (HOSPITAL_COMMUNITY): Payer: Commercial Managed Care - HMO

## 2015-07-06 LAB — TYPE AND SCREEN
ABO/RH(D): O POS
ANTIBODY SCREEN: NEGATIVE
Unit division: 0
Unit division: 0

## 2015-07-06 LAB — CBC
HEMATOCRIT: 36 % (ref 36.0–46.0)
Hemoglobin: 11.7 g/dL — ABNORMAL LOW (ref 12.0–15.0)
MCH: 32.9 pg (ref 26.0–34.0)
MCHC: 32.5 g/dL (ref 30.0–36.0)
MCV: 101.1 fL — AB (ref 78.0–100.0)
PLATELETS: 177 10*3/uL (ref 150–400)
RBC: 3.56 MIL/uL — ABNORMAL LOW (ref 3.87–5.11)
RDW: 15.1 % (ref 11.5–15.5)
WBC: 8 10*3/uL (ref 4.0–10.5)

## 2015-07-06 LAB — COMPREHENSIVE METABOLIC PANEL
ALT: 19 U/L (ref 14–54)
AST: 35 U/L (ref 15–41)
Albumin: 2.8 g/dL — ABNORMAL LOW (ref 3.5–5.0)
Alkaline Phosphatase: 43 U/L (ref 38–126)
Anion gap: 6 (ref 5–15)
BILIRUBIN TOTAL: 1.4 mg/dL — AB (ref 0.3–1.2)
BUN: 5 mg/dL — AB (ref 6–20)
CHLORIDE: 107 mmol/L (ref 101–111)
CO2: 29 mmol/L (ref 22–32)
CREATININE: 0.85 mg/dL (ref 0.44–1.00)
Calcium: 8.5 mg/dL — ABNORMAL LOW (ref 8.9–10.3)
Glucose, Bld: 93 mg/dL (ref 65–99)
POTASSIUM: 3.6 mmol/L (ref 3.5–5.1)
Sodium: 142 mmol/L (ref 135–145)
TOTAL PROTEIN: 5.4 g/dL — AB (ref 6.5–8.1)

## 2015-07-06 LAB — GLUCOSE, CAPILLARY
GLUCOSE-CAPILLARY: 106 mg/dL — AB (ref 65–99)
Glucose-Capillary: 158 mg/dL — ABNORMAL HIGH (ref 65–99)

## 2015-07-06 MED ORDER — NIACIN ER (ANTIHYPERLIPIDEMIC) 500 MG PO TBCR
500.0000 mg | EXTENDED_RELEASE_TABLET | ORAL | Status: DC
  Administered 2015-07-06 – 2015-07-22 (×16): 500 mg via ORAL
  Filled 2015-07-06 (×17): qty 1

## 2015-07-06 MED ORDER — POTASSIUM CHLORIDE CRYS ER 20 MEQ PO TBCR
20.0000 meq | EXTENDED_RELEASE_TABLET | ORAL | Status: DC | PRN
  Administered 2015-07-06: 20 meq via ORAL
  Filled 2015-07-06 (×2): qty 1

## 2015-07-06 NOTE — Plan of Care (Signed)
Problem: Phase II Progression Outcomes Goal: Tolerates progressive ambulation Outcome: Progressing Ambulated once today, needs encouragement and strengthening

## 2015-07-06 NOTE — Progress Notes (Signed)
      [address]       [name],[place] [address]             [phone]        CARDIOTHORACIC SURGERY PROGRESS NOTE   R2 Days Post-Op Procedure(s) (LRB): VIDEO ASSISTED THORACOSCOPY (VATS) LEFT UPPER LOBECTOMY (Left)  Subjective: Looks good and feels better.  Slept some last night.  Appetite improved.  Mild soreness  Objective: Vital signs: BP Readings from Last 1 Encounters:  07/06/15 128/72   Pulse Readings from Last 1 Encounters:  07/06/15 79   Resp Readings from Last 1 Encounters:  07/06/15 13   Temp Readings from Last 1 Encounters:  07/06/15 98.4 F (36.9 C) Oral    Hemodynamics:    Physical Exam:  Rhythm:   sinus  Breath sounds: clear  Heart sounds:  RRR  Incisions:  Dressing dry, intact  Abdomen:  Soft, non-distended, non-tender  Extremities:  Warm, well-perfused  Chest tubes:  Decreasing volume thin serosanguinous output, no air leak    Intake/Output from previous day: 10/29 0701 - 10/30 0700 In: 1948.3 [P.O.:480; I.V.:1268.3; IV Piggyback:200] Out: 2790 [Urine:2440; Chest Tube:350] Intake/Output this shift: Total I/O In: -  Out: 485 [Urine:345; Chest Tube:140]  Lab Results:  CBC: Recent Labs  07/05/15 0409 07/06/15 0350  WBC 8.0 8.0  HGB 12.4 11.7*  HCT 36.4 36.0  PLT 183 177    BMET:  Recent Labs  07/05/15 0409 07/06/15 0350  NA 139 142  K 3.7 3.6  CL 105 107  CO2 26 29  GLUCOSE 135* 93  BUN 8 5*  CREATININE 0.84 0.85  CALCIUM 8.4* 8.5*     PT/INR:  No results for input(s): LABPROT, INR in the last 72 hours.  CBG (last 3)   Recent Labs  07/06/15 1134  GLUCAP 106*    ABG    Component Value Date/Time   PHART 7.380 07/05/2015 0333   PCO2ART 44.9 07/05/2015 0333   PO2ART 85.0 07/05/2015 0333   HCO3 26.7* 07/05/2015 0333   TCO2 28 07/05/2015 0333   O2SAT 96.0 07/05/2015 0333    CXR: PORTABLE CHEST - 1 VIEW  COMPARISON: the previous day's study  FINDINGS: Two left chest tubes remain in  place. No pneumothorax evident. Monitoring hardware overlies the patient. Heart size normal. Mild interstitial opacities in the lung bases left greater than right, stable. No effusion. Visualized skeletal structures are unremarkable.  IMPRESSION: 1. Stable left chest tubes. No pneumothorax.   Electronically Signed  By: [name] M.D.  On: 07/06/2015 08:21  Assessment/Plan: S/P Procedure(s) (LRB): VIDEO ASSISTED THORACOSCOPY (VATS) LEFT UPPER LOBECTOMY (Left)  Doing very well POD2 Mobilize D/C anterior chest tube Remaining tube to water seal Transfer step down  Shannon Alberts, MD 07/06/2015 1:06 PM

## 2015-07-07 ENCOUNTER — Inpatient Hospital Stay (HOSPITAL_COMMUNITY): Payer: Commercial Managed Care - HMO

## 2015-07-07 ENCOUNTER — Encounter (HOSPITAL_COMMUNITY): Payer: Self-pay | Admitting: Thoracic Surgery (Cardiothoracic Vascular Surgery)

## 2015-07-07 MED ORDER — MIDAZOLAM HCL 2 MG/2ML IJ SOLN
INTRAMUSCULAR | Status: AC
  Filled 2015-07-07: qty 2

## 2015-07-07 MED ORDER — MORPHINE SULFATE (PF) 4 MG/ML IV SOLN
4.0000 mg | Freq: Once | INTRAVENOUS | Status: AC
  Administered 2015-07-07: 4 mg via INTRAVENOUS
  Filled 2015-07-07: qty 1

## 2015-07-07 MED ORDER — SODIUM CHLORIDE 0.9 % IJ SOLN
3.0000 mL | INTRAMUSCULAR | Status: DC | PRN
  Administered 2015-07-07: 10 mL via INTRAVENOUS

## 2015-07-07 MED ORDER — LIDOCAINE HCL (PF) 1 % IJ SOLN
30.0000 mL | Freq: Once | INTRAMUSCULAR | Status: AC
  Administered 2015-07-07: 30 mL
  Filled 2015-07-07 (×2): qty 30

## 2015-07-07 MED ORDER — ENOXAPARIN SODIUM 40 MG/0.4ML ~~LOC~~ SOLN
40.0000 mg | SUBCUTANEOUS | Status: DC
  Administered 2015-07-07 – 2015-07-22 (×15): 40 mg via SUBCUTANEOUS
  Filled 2015-07-07 (×22): qty 0.4

## 2015-07-07 MED ORDER — MIDAZOLAM HCL 2 MG/2ML IJ SOLN
2.0000 mg | Freq: Once | INTRAMUSCULAR | Status: AC
  Administered 2015-07-07: 2 mg via INTRAVENOUS
  Filled 2015-07-07: qty 2

## 2015-07-07 NOTE — Progress Notes (Signed)
Pt. With increased amount of SQ air now into face on left side.  Lt. Eye almost swollen shut.  Voice changing.  MD aware.  Order received to not ambulate patient and to leave CT on suction.

## 2015-07-07 NOTE — Care Management Important Message (Signed)
Important Message  Patient Details  Name: [name] MRN: [id number] Date of Birth: 16-Apr-1941   Medicare Important Message Given:  Yes-second notification given    Nathen May 07/07/2015, 5:23 PM

## 2015-07-07 NOTE — Progress Notes (Signed)
      [address]       [place] [address]             [phone]       Temp of 101 this afternoon  Still with subcutaneous emphysema  CT on suction with air leak  Remo Lipps C. Roxan Hockey, MD [facility] [phone]

## 2015-07-07 NOTE — Procedures (Signed)
[name] has had progressively worsening subcutaneous emphysema during the day.  Now has progressed into the face bilaterally including both periorbital areas. Her chest tube is functioning but the subcutaneous air is worsening. She needs an anterior tube placed.  I informed her of the indications, risks, benefits and alternatives. She accepts the risks and agrees to proceed.  Sterile technique utilized. Timeout performed.  Local anesthesia with 20 ml of 1% lidocaine.  67 F chest tube placed left anterior chest without difficulty. + rush of air.  Tolerated well.  CT to suction. CXR pending  [name]. Roxan Hockey, MD [facility] [phone]

## 2015-07-07 NOTE — Progress Notes (Signed)
Dr. Roxan Hockey at bedside to insert Chest Tube

## 2015-07-07 NOTE — Progress Notes (Signed)
Pt sitting up in chair; pt c/o increased pain to L chest; L chest, breast more swollen than previous; sub q air felt at L chest site; suction increased from 10 to 20 at this time; MD called to make aware; no order for chest xray; PO pain meds given; will cont. To monitor.  [name]

## 2015-07-07 NOTE — Progress Notes (Signed)
MD made aware of increased SQ air now in Rt. Side of face as well as the left side.  No new changes otherwise.

## 2015-07-07 NOTE — Progress Notes (Signed)
Chest tube to suction per Dr. Roxan Hockey; MD at bedside; pt with small pneumo on xray; will cont. To monitor.  [name]

## 2015-07-07 NOTE — Progress Notes (Signed)
Patient with increased amount of SQ air this morning.  Patient not ambulated at this time due to the increase amount of SQ air this morning.  Not comfortable with ambulating patient at this time due to the SQ air and suction back on now at 20cm ahara.  Patient with [place] for CT suction at this time.  Beckie Salts, RN

## 2015-07-07 NOTE — Progress Notes (Signed)
3 Days Post-Op Procedure(s) (LRB): VIDEO ASSISTED THORACOSCOPY (VATS) LEFT UPPER LOBECTOMY (Left) Subjective: C/o incisional pain and itching  Objective: Vital signs in last 24 hours: Temp:  [98.4 F (36.9 C)-99.2 F (37.3 C)] 99.2 F (37.3 C) (10/30 2345) Pulse Rate:  [76-102] 84 (10/31 0600) Cardiac Rhythm:  [-] Sinus tachycardia (10/30 2000) Resp:  [10-27] 19 (10/31 0600) BP: (112-145)/(58-102) 130/73 mmHg (10/31 0600) SpO2:  [94 %-100 %] 100 % (10/31 0600)  Hemodynamic parameters for last 24 hours:    Intake/Output from previous day: 10/30 0701 - 10/31 0700 In: 1110 [P.O.:1110] Out: 7741 [Urine:1475; Chest Tube:160] Intake/Output this shift:    General appearance: alert, cooperative and no distress Neurologic: intact Heart: regular rate and rhythm Lungs: diminished breath sounds on left with subcutaneous emphysema Wound: clean and dry + air leak  Lab Results:  Recent Labs  07/05/15 0409 07/06/15 0350  WBC 8.0 8.0  HGB 12.4 11.7*  HCT 36.4 36.0  PLT 183 177   BMET:  Recent Labs  07/05/15 0409 07/06/15 0350  NA 139 142  K 3.7 3.6  CL 105 107  CO2 26 29  GLUCOSE 135* 93  BUN 8 5*  CREATININE 0.84 0.85  CALCIUM 8.4* 8.5*    PT/INR: No results for input(s): LABPROT, INR in the last 72 hours. ABG    Component Value Date/Time   PHART 7.380 07/05/2015 0333   HCO3 26.7* 07/05/2015 0333   TCO2 28 07/05/2015 0333   O2SAT 96.0 07/05/2015 0333   CBG (last 3)   Recent Labs  07/06/15 1134 07/06/15 1543  GLUCAP 106* 158*    Assessment/Plan: S/P Procedure(s) (LRB): VIDEO ASSISTED THORACOSCOPY (VATS) LEFT UPPER LOBECTOMY (Left) Plan for transfer to step-down: see transfer orders  Still waiting for 3 south bed CV- stable, no further bradycardia  RESP- has an air leak this AM, CXR shows a small pneumo and new subcutaneous emphysema  Will place CT back to 10 cm of suction  RENAL - recheck creatinine and lytes tomorrow  DVT prophylaxis- SCD. Will  add enoxaparin   LOS: 3 days    Melrose Nakayama 07/07/2015

## 2015-07-08 ENCOUNTER — Inpatient Hospital Stay (HOSPITAL_COMMUNITY): Payer: Commercial Managed Care - HMO

## 2015-07-08 LAB — CBC
HEMATOCRIT: 36.8 % (ref 36.0–46.0)
Hemoglobin: 12.2 g/dL (ref 12.0–15.0)
MCH: 32.7 pg (ref 26.0–34.0)
MCHC: 33.2 g/dL (ref 30.0–36.0)
MCV: 98.7 fL (ref 78.0–100.0)
PLATELETS: 194 10*3/uL (ref 150–400)
RBC: 3.73 MIL/uL — AB (ref 3.87–5.11)
RDW: 14.3 % (ref 11.5–15.5)
WBC: 7.1 10*3/uL (ref 4.0–10.5)

## 2015-07-08 LAB — BASIC METABOLIC PANEL
ANION GAP: 10 (ref 5–15)
BUN: 8 mg/dL (ref 6–20)
CO2: 27 mmol/L (ref 22–32)
Calcium: 8.4 mg/dL — ABNORMAL LOW (ref 8.9–10.3)
Chloride: 99 mmol/L — ABNORMAL LOW (ref 101–111)
Creatinine, Ser: 0.81 mg/dL (ref 0.44–1.00)
GFR calc Af Amer: >60 mL/min (ref >60)
GLUCOSE: 89 mg/dL (ref 65–99)
POTASSIUM: 3.3 mmol/L — AB (ref 3.5–5.1)
Sodium: 136 mmol/L (ref 135–145)

## 2015-07-08 MED ORDER — POTASSIUM CHLORIDE CRYS ER 20 MEQ PO TBCR
40.0000 meq | EXTENDED_RELEASE_TABLET | Freq: Two times a day (BID) | ORAL | Status: AC
  Administered 2015-07-08 (×2): 40 meq via ORAL
  Filled 2015-07-08 (×2): qty 2

## 2015-07-08 MED ORDER — POTASSIUM CHLORIDE CRYS ER 20 MEQ PO TBCR
20.0000 meq | EXTENDED_RELEASE_TABLET | ORAL | Status: DC | PRN
  Administered 2015-07-08: 20 meq via ORAL
  Filled 2015-07-08: qty 1

## 2015-07-08 NOTE — Plan of Care (Signed)
Dc'ed posterior chest tube at 0950. Patient states her subQ air worse in right side of face. Can feel subQ air down to elbow on right arm, entire left arm, bilateral face and eyes and upper shoulders and back. Will continue to monitor. sats good, no distress, chamber 1 air leak in remaining CT.

## 2015-07-08 NOTE — Progress Notes (Signed)
4 Days Post-Op Procedure(s) (LRB): VIDEO ASSISTED THORACOSCOPY (VATS) LEFT UPPER LOBECTOMY (Left) Subjective: Feels a little better  Objective: Vital signs in last 24 hours: Temp:  [98.6 F (37 C)-101.3 F (38.5 C)] 98.6 F (37 C) (11/01 0345) Pulse Rate:  [83-133] 101 (11/01 0804) Cardiac Rhythm:  [-] Normal sinus rhythm (11/01 0400) Resp:  [12-24] 12 (11/01 0804) BP: (111-158)/(51-95) 122/64 mmHg (11/01 0804) SpO2:  [95 %-100 %] 100 % (11/01 0804) Weight:  [135 lb 12.9 oz (61.6 kg)] 135 lb 12.9 oz (61.6 kg) (11/01 0603)  Hemodynamic parameters for last 24 hours:    Intake/Output from previous day: 10/31 0701 - 11/01 0700 In: 640 [P.O.:640] Out: 1160 [Urine:950; Chest Tube:210] Intake/Output this shift:    General appearance: alert, cooperative and no distress Neurologic: intact Heart: regular rate and rhythm Lungs: wheezes on left Abdomen: normal findings: soft, non-tender Wound: clean and dry + air leak from anterior CT  Lab Results:  Recent Labs  07/06/15 0350 07/08/15 0300  WBC 8.0 7.1  HGB 11.7* 12.2  HCT 36.0 36.8  PLT 177 194   BMET:  Recent Labs  07/06/15 0350 07/08/15 0300  NA 142 136  K 3.6 3.3*  CL 107 99*  CO2 29 27  GLUCOSE 93 89  BUN 5* 8  CREATININE 0.85 0.81  CALCIUM 8.5* 8.4*    PT/INR: No results for input(s): LABPROT, INR in the last 72 hours. ABG    Component Value Date/Time   PHART 7.380 07/05/2015 0333   HCO3 26.7* 07/05/2015 0333   TCO2 28 07/05/2015 0333   O2SAT 96.0 07/05/2015 0333   CBG (last 3)   Recent Labs  07/06/15 1134 07/06/15 1543  GLUCAP 106* 158*    Assessment/Plan: S/P Procedure(s) (LRB): VIDEO ASSISTED THORACOSCOPY (VATS) LEFT UPPER LOBECTOMY (Left) Plan for transfer to step-down: see transfer orders   CV- stable  RESP- air leak with subcutaneous emphysema- required new CT placed last night  Keep anterior CT to suction. Lung is expanded  Dc posterior CT  RENAL_ hypokalemia- supplement  PO  ENDO- CBG well controlled  SCD + enoxaparin for DVT prophylaxis'  Awaiting bed on [place]   LOS: 4 days    Melrose Nakayama 07/08/2015

## 2015-07-08 NOTE — Progress Notes (Signed)
OnQ discontinued without difficulty, Site covered with dry gauze

## 2015-07-08 NOTE — Progress Notes (Signed)
Patient ID: [name], female   DOB: 1941-08-11, 74 y.o.   MRN: [id number]  SICU Evening Rounds:  Hemodynamically stable  Large persistent air leak persists.  Subcutaneous emphysema reportedly stable.

## 2015-07-08 NOTE — Care Management Note (Signed)
Case Management Note  Patient Details  Name: [name] MRN: [id number] Date of Birth: [birth year]  Subjective/Objective:       Pt lives with nephew and his wife, dtr states she will assist as needed so that pt will have 24/7 assistance when medically stable for discharge.                         Expected Discharge Plan:  Home/Self Care  Discharge planning Services  CM Consult  Status of Service:  In process, will continue to follow  Medicare Important Message Given:  [facility] notification given  Girard Cooter, RN 07/08/2015, 9:01 AM

## 2015-07-09 ENCOUNTER — Inpatient Hospital Stay (HOSPITAL_COMMUNITY): Payer: Commercial Managed Care - HMO

## 2015-07-09 LAB — BASIC METABOLIC PANEL
ANION GAP: 6 (ref 5–15)
BUN: 7 mg/dL (ref 6–20)
CO2: 29 mmol/L (ref 22–32)
Calcium: 8.7 mg/dL — ABNORMAL LOW (ref 8.9–10.3)
Chloride: 104 mmol/L (ref 101–111)
Creatinine, Ser: 0.74 mg/dL (ref 0.44–1.00)
Glucose, Bld: 121 mg/dL — ABNORMAL HIGH (ref 65–99)
POTASSIUM: 4.4 mmol/L (ref 3.5–5.1)
SODIUM: 139 mmol/L (ref 135–145)

## 2015-07-09 LAB — CBC
HCT: 36.3 % (ref 36.0–46.0)
Hemoglobin: 12.2 g/dL (ref 12.0–15.0)
MCH: 33 pg (ref 26.0–34.0)
MCHC: 33.6 g/dL (ref 30.0–36.0)
MCV: 98.1 fL (ref 78.0–100.0)
PLATELETS: 215 10*3/uL (ref 150–400)
RBC: 3.7 MIL/uL — AB (ref 3.87–5.11)
RDW: 14.4 % (ref 11.5–15.5)
WBC: 7.1 10*3/uL (ref 4.0–10.5)

## 2015-07-09 MED ORDER — BISACODYL 5 MG PO TBEC
10.0000 mg | DELAYED_RELEASE_TABLET | Freq: Every day | ORAL | Status: DC
  Administered 2015-07-09 – 2015-07-22 (×12): 10 mg via ORAL
  Filled 2015-07-09 (×12): qty 2

## 2015-07-09 NOTE — Progress Notes (Signed)
Utilization Review Completed.  

## 2015-07-09 NOTE — Progress Notes (Signed)
5 Days Post-Op Procedure(s) (LRB): VIDEO ASSISTED THORACOSCOPY (VATS) LEFT UPPER LOBECTOMY (Left) Subjective: "I feel a little better today" Some pain at CT site  Objective: Vital signs in last 24 hours: Temp:  [98.1 F (36.7 C)-99.7 F (37.6 C)] 98.7 F (37.1 C) (11/02 0739) Pulse Rate:  [86-101] 94 (11/02 0600) Cardiac Rhythm:  [-] Normal sinus rhythm (11/02 0400) Resp:  [13-22] 20 (11/02 0600) BP: (114-152)/(68-83) 121/68 mmHg (11/02 0600) SpO2:  [96 %-100 %] 100 % (11/02 0813)  Hemodynamic parameters for last 24 hours:    Intake/Output from previous day: 11/01 0701 - 11/02 0700 In: 660 [P.O.:660] Out: 1070 [Urine:850; Chest Tube:220] Intake/Output this shift:    General appearance: alert, cooperative and no distress Neurologic: intact Heart: regular rate and rhythm Lungs: air leak sound on left large air leak  Lab Results:  Recent Labs  07/08/15 0300 07/09/15 0242  WBC 7.1 7.1  HGB 12.2 12.2  HCT 36.8 36.3  PLT 194 215   BMET:  Recent Labs  07/08/15 0300 07/09/15 0242  NA 136 139  K 3.3* 4.4  CL 99* 104  CO2 27 29  GLUCOSE 89 121*  BUN 8 7  CREATININE 0.81 0.74  CALCIUM 8.4* 8.7*    PT/INR: No results for input(s): LABPROT, INR in the last 72 hours. ABG    Component Value Date/Time   PHART 7.380 07/05/2015 0333   HCO3 26.7* 07/05/2015 0333   TCO2 28 07/05/2015 0333   O2SAT 96.0 07/05/2015 0333   CBG (last 3)   Recent Labs  07/06/15 1134 07/06/15 1543  GLUCAP 106* 158*    Assessment/Plan: S/P Procedure(s) (LRB): VIDEO ASSISTED THORACOSCOPY (VATS) LEFT UPPER LOBECTOMY (Left) -  Still awaiting a bed on 3 [place] leak unchanged. Will try CT on 20 cm of suction  Otherwise doing well  Enoxaparin + SCD for DVT prophylaxis  PATH- T1a,N0- stage IA.  Patient informed   LOS: 5 days    Melrose Nakayama 07/09/2015

## 2015-07-10 ENCOUNTER — Inpatient Hospital Stay (HOSPITAL_COMMUNITY): Payer: Commercial Managed Care - HMO

## 2015-07-10 MED ORDER — OFF THE BEAT BOOK
Freq: Once | Status: AC
  Administered 2015-07-10: 13:00:00
  Filled 2015-07-10: qty 1

## 2015-07-10 NOTE — Significant Event (Signed)
Ambulated patient in the halls, patient walked approximately 200 feet, on 4L [place], chest tube with persistent air leak and is at -20cm suction (maintain on suction via a portable suction throughout ambulation). Patient returned to room and settled in recliner chair. Dr. Roxan Hockey came to see patient just as patient settled in the chair, MD turned chest tube to -40cm suction. MD gave verbal order to keep at wall suction at -40cm. Will continue to monitor. Chaunte Hornbeck, Therapist, sports.

## 2015-07-10 NOTE — Progress Notes (Signed)
6 Days Post-Op Procedure(s) (LRB): VIDEO ASSISTED THORACOSCOPY (VATS) LEFT UPPER LOBECTOMY (Left) Subjective: No complaints  Objective: Vital signs in last 24 hours: Temp:  [98.6 F (37 C)-99.9 F (37.7 C)] 98.8 F (37.1 C) (11/03 0742) Pulse Rate:  [80-102] 85 (11/03 0800) Cardiac Rhythm:  [-] Normal sinus rhythm (11/03 0735) Resp:  [11-24] 19 (11/03 0800) BP: (115-140)/(64-85) 119/81 mmHg (11/03 0800) SpO2:  [93 %-100 %] 99 % (11/03 0808) Weight:  [127 lb 13.9 oz (58 kg)] 127 lb 13.9 oz (58 kg) (11/03 0600)  Hemodynamic parameters for last 24 hours:    Intake/Output from previous day: 11/02 0701 - 11/03 0700 In: 380 [P.O.:380] Out: 581 [Urine:475; Chest Tube:106] Intake/Output this shift:    General appearance: alert, cooperative and no distress Neurologic: intact Heart: regular rate and rhythm Lungs: rhonchi on left subcutaneous emphysema slightly improved, air leak persists  Lab Results:  Recent Labs  07/08/15 0300 07/09/15 0242  WBC 7.1 7.1  HGB 12.2 12.2  HCT 36.8 36.3  PLT 194 215   BMET:  Recent Labs  07/08/15 0300 07/09/15 0242  NA 136 139  K 3.3* 4.4  CL 99* 104  CO2 27 29  GLUCOSE 89 121*  BUN 8 7  CREATININE 0.81 0.74  CALCIUM 8.4* 8.7*    PT/INR: No results for input(s): LABPROT, INR in the last 72 hours. ABG    Component Value Date/Time   PHART 7.380 07/05/2015 0333   HCO3 26.7* 07/05/2015 0333   TCO2 28 07/05/2015 0333   O2SAT 96.0 07/05/2015 0333   CBG (last 3)  No results for input(s): GLUCAP in the last 72 hours.  Assessment/Plan: S/P Procedure(s) (LRB): VIDEO ASSISTED THORACOSCOPY (VATS) LEFT UPPER LOBECTOMY (Left) -  POD # 6 Still has an air leak, essentially unchanged from yesterday Continue CT to suction May need a bronchial valve   LOS: 6 days    Melrose Nakayama 07/10/2015

## 2015-07-10 NOTE — Progress Notes (Signed)
      [address]       [place] [address]             [phone]      [name]'s air leak persists and may even be a little larger at this point.  I do not think it will resolve spontaneously. That leaves [name] with two options- reoperation or IBV placement.  Of the 2 IBV placement has less risk and lower morbidity.   I advised [name] to have intrabronchial valve placement for air leak. I informed Shannon of the nature of the procedure and that it would be done under general anesthesia. She is aware there is no guarantee of success. I informed Shannon of teh indications, risks, benefits and alternatives. She understands the risks include but are not limited to death, MI, bleeding, valve malposition or dysfunction. She does understand the valve(s) will need to be removed in about 6 weeks. I also informed [name] of these things.  She wishes to proceed. Will plan IBV placement tomorrow.  Revonda Standard Roxan Hockey, MD [facility] [phone]

## 2015-07-10 NOTE — Progress Notes (Signed)
CT Surgery PM Rounds  Patient examined and record reviewed.Hemodynamics stable,labs satisfactory.Patient had stable day.Continue current care. [name] 07/10/2015

## 2015-07-10 NOTE — Progress Notes (Signed)
Patient ID: [name], female   DOB: March 03, 1941, 74 y.o.   MRN: [id number] EVENING ROUNDS NOTE :     [address] [place]       [place] [address]             [phone]                 6 Days Post-Op Procedure(s) (LRB): VIDEO ASSISTED THORACOSCOPY (VATS) LEFT UPPER LOBECTOMY (Left)  Total Length of Stay:  LOS: 6 days  BP 118/68 mmHg  Pulse 88  Temp(Src) 99.7 F (37.6 C) (Oral)  Resp 15  Ht '5\' 3"'$  (1.6 m)  Wt 127 lb 13.9 oz (58 kg)  BMI 22.66 kg/m2  SpO2 100%  .Intake/Output      11/03 0701 - 11/04 0700   P.O. 240   Total Intake(mL/kg) 240 (4.1)   Urine (mL/kg/hr) 500 (0.7)   Chest Tube 20 (0)   Total Output 520   Net -280             Lab Results  Component Value Date   WBC 7.1 07/09/2015   HGB 12.2 07/09/2015   HCT 36.3 07/09/2015   PLT 215 07/09/2015   GLUCOSE 121* 07/09/2015   ALT 19 07/06/2015   AST 35 07/06/2015   NA 139 07/09/2015   K 4.4 07/09/2015   CL 104 07/09/2015   CREATININE 0.74 07/09/2015   BUN 7 07/09/2015   CO2 29 07/09/2015   TSH 1.840 05/25/2014   INR 1.02 07/02/2015   Stable day, air leak present, for bronchial valves in am  Grace Isaac MD  Beeper [phone] Office [phone] 07/10/2015 7:45 PM

## 2015-07-11 ENCOUNTER — Inpatient Hospital Stay (HOSPITAL_COMMUNITY): Payer: Commercial Managed Care - HMO | Admitting: Anesthesiology

## 2015-07-11 ENCOUNTER — Encounter (HOSPITAL_COMMUNITY): Payer: Self-pay | Admitting: Anesthesiology

## 2015-07-11 ENCOUNTER — Inpatient Hospital Stay (HOSPITAL_COMMUNITY): Payer: Commercial Managed Care - HMO

## 2015-07-11 ENCOUNTER — Encounter (HOSPITAL_COMMUNITY)
Admission: RE | Disposition: A | Discharge status: Home / Self Care | Payer: Self-pay | Source: Ambulatory Visit | Attending: Thoracic Surgery (Cardiothoracic Vascular Surgery)

## 2015-07-11 DIAGNOSIS — J95812 Postprocedural air leak: Secondary | ICD-10-CM

## 2015-07-11 HISTORY — PX: VIDEO BRONCHOSCOPY WITH INSERTION OF INTERBRONCHIAL VALVE (IBV): SHX6178

## 2015-07-11 SURGERY — BRONCHOSCOPY, FLEXIBLE, WITH INTRABRONCHIAL VALVE INSERTION
Anesthesia: General | Site: Chest

## 2015-07-11 MED ORDER — ARTIFICIAL TEARS OP OINT
TOPICAL_OINTMENT | OPHTHALMIC | Status: AC
  Filled 2015-07-11: qty 7

## 2015-07-11 MED ORDER — PROPOFOL 10 MG/ML IV BOLUS
INTRAVENOUS | Status: AC
Start: 2015-07-11 — End: 2015-07-11
  Filled 2015-07-11: qty 20

## 2015-07-11 MED ORDER — ONDANSETRON HCL 4 MG/2ML IJ SOLN
INTRAMUSCULAR | Status: DC | PRN
  Administered 2015-07-11: 4 mg via INTRAVENOUS

## 2015-07-11 MED ORDER — MIDAZOLAM HCL 2 MG/2ML IJ SOLN
INTRAMUSCULAR | Status: AC
  Filled 2015-07-11: qty 4

## 2015-07-11 MED ORDER — PHENYLEPHRINE HCL 10 MG/ML IJ SOLN
INTRAMUSCULAR | Status: AC
  Filled 2015-07-11: qty 1

## 2015-07-11 MED ORDER — SODIUM CHLORIDE 0.9 % IJ SOLN
INTRAMUSCULAR | Status: AC
  Filled 2015-07-11: qty 10

## 2015-07-11 MED ORDER — ALBUTEROL SULFATE HFA 108 (90 BASE) MCG/ACT IN AERS
INHALATION_SPRAY | RESPIRATORY_TRACT | Status: AC
Start: 2015-07-11 — End: 2015-07-11
  Filled 2015-07-11: qty 6.7

## 2015-07-11 MED ORDER — EPHEDRINE SULFATE 50 MG/ML IJ SOLN
INTRAMUSCULAR | Status: AC
  Filled 2015-07-11: qty 1

## 2015-07-11 MED ORDER — FENTANYL CITRATE (PF) 100 MCG/2ML IJ SOLN: 25.0000 ug | INTRAMUSCULAR | Status: DC | PRN

## 2015-07-11 MED ORDER — 0.9 % SODIUM CHLORIDE (POUR BTL) OPTIME
TOPICAL | Status: DC | PRN
  Administered 2015-07-11: 1000 mL

## 2015-07-11 MED ORDER — SODIUM CHLORIDE 0.9 % IV SOLN
10.0000 mg | INTRAVENOUS | Status: DC | PRN
  Administered 2015-07-11: 25 ug/min via INTRAVENOUS

## 2015-07-11 MED ORDER — ROCURONIUM BROMIDE 100 MG/10ML IV SOLN
INTRAVENOUS | Status: DC | PRN
  Administered 2015-07-11: 50 mg via INTRAVENOUS

## 2015-07-11 MED ORDER — SUGAMMADEX SODIUM 200 MG/2ML IV SOLN
INTRAVENOUS | Status: DC | PRN
  Administered 2015-07-11: 119.8 mg via INTRAVENOUS

## 2015-07-11 MED ORDER — LIDOCAINE HCL (CARDIAC) 20 MG/ML IV SOLN
INTRAVENOUS | Status: DC | PRN
  Administered 2015-07-11: 100 mg via INTRAVENOUS

## 2015-07-11 MED ORDER — SUGAMMADEX SODIUM 200 MG/2ML IV SOLN
INTRAVENOUS | Status: AC
  Filled 2015-07-11: qty 2

## 2015-07-11 MED ORDER — FENTANYL CITRATE (PF) 100 MCG/2ML IJ SOLN
INTRAMUSCULAR | Status: DC | PRN
  Administered 2015-07-11 (×2): 50 ug via INTRAVENOUS

## 2015-07-11 MED ORDER — LACTATED RINGERS IV SOLN
INTRAVENOUS | Status: DC | PRN
  Administered 2015-07-11 (×2): via INTRAVENOUS

## 2015-07-11 MED ORDER — FENTANYL CITRATE (PF) 250 MCG/5ML IJ SOLN
INTRAMUSCULAR | Status: AC
  Filled 2015-07-11: qty 5

## 2015-07-11 MED ORDER — ONDANSETRON HCL 4 MG/2ML IJ SOLN
INTRAMUSCULAR | Status: AC
  Filled 2015-07-11: qty 4

## 2015-07-11 MED ORDER — PROPOFOL 10 MG/ML IV BOLUS
INTRAVENOUS | Status: DC | PRN
Start: 2015-07-11 — End: 2015-07-11
  Administered 2015-07-11: 120 mg via INTRAVENOUS

## 2015-07-11 MED ORDER — MIDAZOLAM HCL 5 MG/5ML IJ SOLN
INTRAMUSCULAR | Status: DC | PRN
Start: 2015-07-11 — End: 2015-07-11
  Administered 2015-07-11: 2 mg via INTRAVENOUS

## 2015-07-11 SURGICAL SUPPLY — 39 items
CANISTER SUCTION 2500CC (MISCELLANEOUS) ×3 IMPLANT
CATH EMB 6FR 80CM (CATHETERS) ×3 IMPLANT
CATH LOADER DEPLOYMENT HUD (CATHETERS) ×2 IMPLANT
CLOSURE WOUND 1/2 X4 (GAUZE/BANDAGES/DRESSINGS) ×1
CONT SPEC 4OZ CLIKSEAL STRL BL (MISCELLANEOUS) ×3 IMPLANT
COVER TABLE BACK 60X90 (DRAPES) ×3 IMPLANT
DISPOSABLE BALLOON CATHETER ×1 IMPLANT
Deployment catheter and loader spiration valve sys ×3 IMPLANT
FILTER STRAW FLUID ASPIR (MISCELLANEOUS) IMPLANT
FORCEPS BIOP RJ4 1.8 (CUTTING FORCEPS) IMPLANT
FORCEPS RADIAL JAW LRG 4 PULM (INSTRUMENTS) IMPLANT
GAUZE SPONGE 4X4 12PLY STRL (GAUZE/BANDAGES/DRESSINGS) ×3 IMPLANT
GLOVE BIOGEL PI IND STRL 6.5 (GLOVE) ×2 IMPLANT
GLOVE BIOGEL PI INDICATOR 6.5 (GLOVE) ×4
GLOVE SURG SIGNA 7.5 PF LTX (GLOVE) ×3 IMPLANT
GOWN STRL REUS W/ TWL LRG LVL3 (GOWN DISPOSABLE) ×1 IMPLANT
GOWN STRL REUS W/ TWL XL LVL3 (GOWN DISPOSABLE) ×1 IMPLANT
GOWN STRL REUS W/TWL LRG LVL3 (GOWN DISPOSABLE) ×3
GOWN STRL REUS W/TWL XL LVL3 (GOWN DISPOSABLE) ×3
KIT AIRWAY SIZING HUD (KITS) ×2 IMPLANT
KIT CLEAN ENDO COMPLIANCE (KITS) ×3 IMPLANT
KIT ROOM TURNOVER OR (KITS) ×3 IMPLANT
MARKER SKIN DUAL TIP RULER LAB (MISCELLANEOUS) ×3 IMPLANT
NS IRRIG 1000ML POUR BTL (IV SOLUTION) ×3 IMPLANT
OIL SILICONE PENTAX (PARTS (SERVICE/REPAIRS)) ×3 IMPLANT
PAD ARMBOARD 7.5X6 YLW CONV (MISCELLANEOUS) ×6 IMPLANT
RADIAL JAW LRG 4 PULMONARY (INSTRUMENTS) ×2
STOPCOCK 4 WAY LG BORE MALE ST (IV SETS) ×3 IMPLANT
STOPCOCK MORSE 400PSI 3WAY (MISCELLANEOUS) ×3 IMPLANT
STRIP CLOSURE SKIN 1/2X4 (GAUZE/BANDAGES/DRESSINGS) ×1 IMPLANT
SYR 20ML ECCENTRIC (SYRINGE) ×3 IMPLANT
SYRINGE 10CC LL (SYRINGE) ×3 IMPLANT
TOWEL NATURAL 4PK STERILE (DISPOSABLE) ×3 IMPLANT
TRAP SPECIMEN MUCOUS 40CC (MISCELLANEOUS) ×3 IMPLANT
TUBE CONNECTING 20'X1/4 (TUBING) ×1
TUBE CONNECTING 20X1/4 (TUBING) ×2 IMPLANT
VALVE IN CARTRIDGE 6MM HUD (Valve) ×6 IMPLANT
VALVE IN CARTRIDGE 7MM HUD (Valve) ×6 IMPLANT
VALVE IN CARTRIDGE 9MM HUD (Valve) ×3 IMPLANT

## 2015-07-11 NOTE — Progress Notes (Signed)
CT surgery p.m. Rounds  Patient breathing comfortably, oxygen saturation 95% Airleak through chest tube improved after placement of endobronchial valve today Continue suction at 10 cm water

## 2015-07-11 NOTE — Brief Op Note (Signed)
07/04/2015 - 07/11/2015  12:25 PM  PATIENT:  [name]  74 y.o. female  PRE-OPERATIVE DIAGNOSIS:  Prolonged air leak post left upper lobectomy  POST-OPERATIVE DIAGNOSIS:  Prolonged air leak post left upper lobectomy  PROCEDURE:  Procedure(s): VIDEO BRONCHOSCOPY WITH INSERTION OF INTERBRONCHIAL VALVE (IBV) (N/A) x 4  SURGEON:  Surgeon(s) and Role:    * Melrose Nakayama, MD - Primary  ANESTHESIA:   general  EBL:  Total I/O In: 1100 [I.V.:1100] Out: 26 [Blood:1; Chest Tube:40]  BLOOD ADMINISTERED:none  DRAINS: none   LOCAL MEDICATIONS USED:  NONE  SPECIMEN:  No Specimen  DISPOSITION OF SPECIMEN:  N/A  PLAN OF CARE: Admit to inpatient   PATIENT DISPOSITION:  PACU - hemodynamically stable.   Delay start of Pharmacological VTE agent (>24hrs) due to surgical blood loss or risk of bleeding: no

## 2015-07-11 NOTE — Transfer of Care (Signed)
Immediate Anesthesia Transfer of Care Note  Patient: [name]  Procedure(s) Performed: Procedure(s): VIDEO BRONCHOSCOPY WITH INSERTION OF INTERBRONCHIAL VALVE (IBV) (N/A)  Patient Location: PACU  Anesthesia Type:General  Level of Consciousness: awake, alert  and oriented  Airway & Oxygen Therapy: Patient Spontanous Breathing and Patient connected to face mask oxygen  Post-op Assessment: Report given to RN, Post -op Vital signs reviewed and stable and Patient moving all extremities X 4  Post vital signs: Reviewed and stable  Last Vitals:  Filed Vitals:   07/11/15 0804  BP:   Pulse:   Temp: 37.6 C  Resp:     Complications: No apparent anesthesia complications

## 2015-07-11 NOTE — Anesthesia Postprocedure Evaluation (Signed)
  Anesthesia Post-op Note  Patient: [name]  Procedure(s) Performed: Procedure(s): VIDEO BRONCHOSCOPY WITH INSERTION OF INTERBRONCHIAL VALVE (IBV) (N/A)  Patient Location: PACU  Anesthesia Type:General  Level of Consciousness: awake and alert   Airway and Oxygen Therapy: Patient Spontanous Breathing  Post-op Pain: Controlled  Post-op Assessment: Post-op Vital signs reviewed, Patient's Cardiovascular Status Stable and Respiratory Function Stable  Post-op Vital Signs: Reviewed  Filed Vitals:   07/11/15 1255  BP:   Pulse:   Temp: 37.4 C  Resp:     Complications: No apparent anesthesia complications

## 2015-07-11 NOTE — H&P (View-Only) (Signed)
      [address]       [place] [address]             [phone]      [name]'s air leak persists and may even be a little larger at this point.  I do not think it will resolve spontaneously. That leaves [name] with two options- reoperation or IBV placement.  Of the 2 IBV placement has less risk and lower morbidity.   I advised [name] to have intrabronchial valve placement for air leak. I informed her of the nature of the procedure and that it would be done under general anesthesia. She is aware there is no guarantee of success. I informed her of teh indications, risks, benefits and alternatives. She understands the risks include but are not limited to death, MI, bleeding, valve malposition or dysfunction. She does understand the valve(s) will need to be removed in about 6 weeks. I also informed her daughter of these things.  She wishes to proceed. Will plan IBV placement tomorrow.  Revonda Standard Roxan Hockey, MD [facility] [phone]

## 2015-07-11 NOTE — Anesthesia Procedure Notes (Signed)
Procedure Name: Intubation Date/Time: 07/11/2015 10:32 AM Performed by: Neldon Newport Pre-anesthesia Checklist: Patient being monitored, Suction available, Emergency Drugs available, Patient identified and Timeout performed Patient Re-evaluated:Patient Re-evaluated prior to inductionOxygen Delivery Method: Circle system utilized Preoxygenation: Pre-oxygenation with 100% oxygen Intubation Type: IV induction Ventilation: Mask ventilation without difficulty Laryngoscope Size: Mac and 3 Grade View: Grade I Tube type: Oral Tube size: 8.5 mm Number of attempts: 1 Placement Confirmation: positive ETCO2,  breath sounds checked- equal and bilateral and ETT inserted through vocal cords under direct vision Secured at: 22 cm Tube secured with: Tape Dental Injury: Teeth and Oropharynx as per pre-operative assessment

## 2015-07-11 NOTE — Care Management Important Message (Signed)
Important Message  Patient Details  Name: [name] MRN: [id number] Date of Birth: Oct 05, 1940   Medicare Important Message Given:  Yes-third notification given    Nathen May 07/11/2015, 10:28 AM

## 2015-07-11 NOTE — Anesthesia Preprocedure Evaluation (Addendum)
Anesthesia Evaluation  Patient identified by MRN, date of birth, ID band Patient awake    Reviewed: Allergy & Precautions, H&P , NPO status , Patient's Chart, lab work & pertinent test results  Airway Mallampati: III  TM Distance: >3 FB Neck ROM: Full    Dental no notable dental hx. (+) Edentulous Upper, Edentulous Lower, Dental Advisory Given   Pulmonary shortness of breath and with exertion, asthma , COPD,  COPD inhaler, Current Smoker,    Pulmonary exam normal breath sounds clear to auscultation       Cardiovascular negative cardio ROS   Rhythm:Regular Rate:Normal     Neuro/Psych PSYCHIATRIC DISORDERS Depression negative neurological ROS     GI/Hepatic Neg liver ROS, GERD  Medicated and Controlled,  Endo/Other  negative endocrine ROS  Renal/GU negative Renal ROS  negative genitourinary   Musculoskeletal  (+) Arthritis , Osteoarthritis,    Abdominal   Peds  Hematology negative hematology ROS (+)   Anesthesia Other Findings   Reproductive/Obstetrics negative OB ROS                           Anesthesia Physical Anesthesia Plan  ASA: III  Anesthesia Plan: General   Post-op Pain Management:    Induction: Intravenous  Airway Management Planned: Oral ETT  Additional Equipment:   Intra-op Plan:   Post-operative Plan: Extubation in OR and Possible Post-op intubation/ventilation  Informed Consent: I have reviewed the patients History and Physical, chart, labs and discussed the procedure including the risks, benefits and alternatives for the proposed anesthesia with the patient or authorized representative who has indicated his/her understanding and acceptance.   Dental advisory given and Dental Advisory Given  Plan Discussed with: CRNA, Anesthesiologist and Surgeon  Anesthesia Plan Comments:       Anesthesia Quick Evaluation

## 2015-07-11 NOTE — Interval H&P Note (Signed)
History and Physical Interval Note:  07/11/2015 9:52 AM  [name]  has presented today for surgery, with the diagnosis of AIRLEAK  The various methods of treatment have been discussed with the patient and family. After consideration of risks, benefits and other options for treatment, the patient has consented to  Procedure(s): VIDEO BRONCHOSCOPY WITH INSERTION OF INTERBRONCHIAL VALVE (IBV) (N/A) as a surgical intervention .  The patient's history has been reviewed, patient examined, no change in status, stable for surgery.  I have reviewed the patient's chart and labs.  Questions were answered to the patient's satisfaction.     Melrose Nakayama

## 2015-07-11 NOTE — Progress Notes (Signed)
Pt transported to Short Stay Holding area on Monitor and Oxygen. Per Dr. Roxan Hockey it is ok for CT to be unhooked from wall suction during trip but to be hooked up immediately upon arrival.  CT hooked to suction.  VSS. Report given to receiving RN and all questions answered.

## 2015-07-12 ENCOUNTER — Inpatient Hospital Stay (HOSPITAL_COMMUNITY): Payer: Commercial Managed Care - HMO

## 2015-07-12 LAB — GLUCOSE, CAPILLARY: GLUCOSE-CAPILLARY: 90 mg/dL (ref 65–99)

## 2015-07-12 MED ORDER — ENSURE ENLIVE PO LIQD
237.0000 mL | Freq: Two times a day (BID) | ORAL | Status: DC
  Administered 2015-07-12 – 2015-07-21 (×12): 237 mL via ORAL

## 2015-07-12 NOTE — Progress Notes (Signed)
1 Day Post-Op Procedure(s) (LRB): VIDEO BRONCHOSCOPY WITH INSERTION OF INTERBRONCHIAL VALVE (IBV) (N/A) Subjective: Patient doing well after placement of EBV for postoperative pneumothorax and subcutaneous emphysema, left chest tube in good position  Chest x-ray today improved with full reexpansion of the lung Subcutaneous space air has improved, patient less symptomatic Moderate 1-2 column air leak from chest tube on 10 cm suction We'll leave patient on suction.  Objective: Vital signs in last 24 hours: Temp:  [97.8 F (36.6 C)-99.9 F (37.7 C)] 99.2 F (37.3 C) (11/05 0400) Pulse Rate:  [72-96] 79 (11/05 1100) Cardiac Rhythm:  [-] Normal sinus rhythm (11/05 0800) Resp:  [12-23] 23 (11/05 1100) BP: (103-137)/(58-76) 104/69 mmHg (11/05 1100) SpO2:  [93 %-100 %] 100 % (11/05 1100)  Hemodynamic parameters for last 24 hours:   stable  Intake/Output from previous day: 11/04 0701 - 11/05 0700 In: 1380 [P.O.:240; I.V.:1140] Out: 471 [Urine:400; Blood:1; Chest Tube:70] Intake/Output this shift: Total I/O In: -  Out: 350 [Urine:350]  Mild air leak Lungs clear Neuro intact  Lab Results: No results for input(s): WBC, HGB, HCT, PLT in the last 72 hours. BMET: No results for input(s): NA, K, CL, CO2, GLUCOSE, BUN, CREATININE, CALCIUM in the last 72 hours.  PT/INR: No results for input(s): LABPROT, INR in the last 72 hours. ABG    Component Value Date/Time   PHART 7.380 07/05/2015 0333   HCO3 26.7* 07/05/2015 0333   TCO2 28 07/05/2015 0333   O2SAT 96.0 07/05/2015 0333   CBG (last 3)   Recent Labs  07/12/15 0914  GLUCAP 90    Assessment/Plan: S/P Procedure(s) (LRB): VIDEO BRONCHOSCOPY WITH INSERTION OF INTERBRONCHIAL VALVE (IBV) (N/A) Continue current care Leave chest tube suction Repeat x-ray in morning Okay to transfer to stepdown   LOS: 8 days    Tharon Aquas Trigt III 07/12/2015

## 2015-07-13 ENCOUNTER — Inpatient Hospital Stay (HOSPITAL_COMMUNITY): Payer: Commercial Managed Care - HMO

## 2015-07-13 LAB — BASIC METABOLIC PANEL
Anion gap: 8 (ref 5–15)
BUN: 8 mg/dL (ref 6–20)
CO2: 31 mmol/L (ref 22–32)
Calcium: 8.6 mg/dL — ABNORMAL LOW (ref 8.9–10.3)
Chloride: 98 mmol/L — ABNORMAL LOW (ref 101–111)
Creatinine, Ser: 0.8 mg/dL (ref 0.44–1.00)
GFR calc Af Amer: >60 mL/min (ref >60)
GFR calc non Af Amer: >60 mL/min (ref >60)
Glucose, Bld: 100 mg/dL — ABNORMAL HIGH (ref 65–99)
Potassium: 3.5 mmol/L (ref 3.5–5.1)
Sodium: 137 mmol/L (ref 135–145)

## 2015-07-13 LAB — CBC
HCT: 34.4 % — ABNORMAL LOW (ref 36.0–46.0)
Hemoglobin: 11.3 g/dL — ABNORMAL LOW (ref 12.0–15.0)
MCH: 32.3 pg (ref 26.0–34.0)
MCHC: 32.8 g/dL (ref 30.0–36.0)
MCV: 98.3 fL (ref 78.0–100.0)
Platelets: 336 10*3/uL (ref 150–400)
RBC: 3.5 MIL/uL — ABNORMAL LOW (ref 3.87–5.11)
RDW: 14.2 % (ref 11.5–15.5)
WBC: 5.9 10*3/uL (ref 4.0–10.5)

## 2015-07-13 NOTE — Progress Notes (Addendum)
      [address]       [place] [address]             [phone]      2 Days Post-Op Procedure(s) (LRB): VIDEO BRONCHOSCOPY WITH INSERTION OF INTERBRONCHIAL VALVE (IBV) (N/A)   Subjective:  No new complaints.  States she is doing okay.  + ambulation  No BM  Objective: Vital signs in last 24 hours: Temp:  [97.9 F (36.6 C)-99.1 F (37.3 C)] 99 F (37.2 C) (11/06 0723) Pulse Rate:  [70-86] 78 (11/06 0724) Cardiac Rhythm:  [-] Normal sinus rhythm (11/06 0801) Resp:  [15-22] 19 (11/06 0724) BP: (101-128)/(60-80) 115/65 mmHg (11/06 0724) SpO2:  [94 %-99 %] 94 % (11/06 0828)  Intake/Output from previous day: 11/05 0701 - 11/06 0700 In: 120 [P.O.:120] Out: 680 [Urine:650; Chest Tube:30] Intake/Output this shift: Total I/O In: -  Out: 300 [Urine:300]  General appearance: alert, cooperative and no distress Heart: regular rate and rhythm Lungs: clear to auscultation bilaterally Abdomen: soft, non-tender; bowel sounds normal; no masses,  no organomegaly Wound: clean and dry  Lab Results:  Recent Labs  07/13/15 0140  WBC 5.9  HGB 11.3*  HCT 34.4*  PLT 336   BMET:  Recent Labs  07/13/15 0140  NA 137  K 3.5  CL 98*  CO2 31  GLUCOSE 100*  BUN 8  CREATININE 0.80  CALCIUM 8.6*    PT/INR: No results for input(s): LABPROT, INR in the last 72 hours. ABG    Component Value Date/Time   PHART 7.380 07/05/2015 0333   HCO3 26.7* 07/05/2015 0333   TCO2 28 07/05/2015 0333   O2SAT 96.0 07/05/2015 0333   CBG (last 3)   Recent Labs  07/12/15 0914  GLUCAP 90    Assessment/Plan: S/P Procedure(s) (LRB): VIDEO BRONCHOSCOPY WITH INSERTION OF INTERBRONCHIAL VALVE (IBV) (N/A)  1. Chest tube- intermittent air leak 1+ at first glance, however no air leak with cough- CXR remains stable, with tiny apical pneumothorax, sub q air- leave chest tube on suction today 2. Pulm- wean oxygen as tolerated 3. Dispo- patient stable, CXR remains stable, leave  chest tube to suction today, repeat CXR in AM   LOS: 9 days    [name], Shannon 07/13/2015  patient examined and medical record reviewed,agree with above note. Tharon Aquas Trigt III 07/13/2015

## 2015-07-13 NOTE — Progress Notes (Signed)
Attempted to get pt oob pt said she would get up after breakfast

## 2015-07-14 ENCOUNTER — Inpatient Hospital Stay (HOSPITAL_COMMUNITY): Payer: Commercial Managed Care - HMO

## 2015-07-14 ENCOUNTER — Encounter (HOSPITAL_COMMUNITY): Payer: Self-pay | Admitting: Thoracic Surgery (Cardiothoracic Vascular Surgery)

## 2015-07-14 NOTE — Progress Notes (Signed)
3 Days Post-Op Procedure(s) (LRB): VIDEO BRONCHOSCOPY WITH INSERTION OF INTERBRONCHIAL VALVE (IBV) (N/A) Subjective: Some soreness around CT site, otherwise feels well  Objective: Vital signs in last 24 hours: Temp:  [98 F (36.7 C)-98.7 F (37.1 C)] 98.3 F (36.8 C) (11/07 0715) Pulse Rate:  [64-88] 71 (11/07 0715) Cardiac Rhythm:  [-] Normal sinus rhythm (11/07 0715) Resp:  [15-20] 20 (11/07 0715) BP: (113-123)/(54-81) 114/81 mmHg (11/07 0715) SpO2:  [94 %-98 %] 95 % (11/07 0715)  Hemodynamic parameters for last 24 hours:    Intake/Output from previous day: 11/06 0701 - 11/07 0700 In: 60 [P.O.:60] Out: 1240 [Urine:1150; Chest Tube:90] Intake/Output this shift: Total I/O In: -  Out: 30 [Chest Tube:30]  General appearance: alert, cooperative and no distress Neurologic: intact Heart: regular rate and rhythm Lungs: clear to auscultation bilaterally Wound: clean and dry  Lab Results:  Recent Labs  07/13/15 0140  WBC 5.9  HGB 11.3*  HCT 34.4*  PLT 336   BMET:  Recent Labs  07/13/15 0140  NA 137  K 3.5  CL 98*  CO2 31  GLUCOSE 100*  BUN 8  CREATININE 0.80  CALCIUM 8.6*    PT/INR: No results for input(s): LABPROT, INR in the last 72 hours. ABG    Component Value Date/Time   PHART 7.380 07/05/2015 0333   HCO3 26.7* 07/05/2015 0333   TCO2 28 07/05/2015 0333   O2SAT 96.0 07/05/2015 0333   CBG (last 3)   Recent Labs  07/12/15 0914  GLUCAP 90    Assessment/Plan: S/P Procedure(s) (LRB): VIDEO BRONCHOSCOPY WITH INSERTION OF INTERBRONCHIAL VALVE (IBV) (N/A) -  Still has small air leak but dramatically improved after valve placement Subcutaneous emphysema improved Will try on water seal today. Continue ambulation SCD + enoxaparin for DVT prophylaxis   LOS: 10 days    Melrose Nakayama 07/14/2015

## 2015-07-14 NOTE — Discharge Summary (Signed)
Physician Discharge Summary  Patient ID: [name] MRN: [id number] DOB/AGE: February 18, 1941 74 y.o.  Admit date: 07/04/2015 Discharge date: 07/14/2015  Admission Diagnoses:  Patient Active Problem List   Diagnosis Date Noted  . Adenocarcinoma of left lung ([place]) 07/04/2015  . 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 Lung nodule 10/17/2014  . Multinodular goiter (nontoxic) 07/18/2014  . Malnutrition of moderate degree ([place]) 05/24/2014  . Moderate malnutrition ([place]) 05/24/2014  . Other and unspecified hyperlipidemia 05/23/2014  . Rhinitis, allergic 11/22/2013  . COPD (chronic obstructive pulmonary disease) ([place]) 12/15/2012   Discharge Diagnoses:  Stage IB adenocarcinoma left lung (T2a,N0) Patient Active Problem List   Diagnosis Date Noted  . Adenocarcinoma of left lung ([place]) 07/04/2015  . 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 Lung nodule 10/17/2014  . Multinodular goiter (nontoxic) 07/18/2014  . Malnutrition of moderate degree ([place]) 05/24/2014  . Moderate malnutrition ([place]) 05/24/2014  . Other and unspecified hyperlipidemia 05/23/2014  . Rhinitis, allergic 11/22/2013  . COPD (chronic obstructive pulmonary disease) ([place]) 12/15/2012   Discharged Condition: good  History Of Present Illness:  [name] is a 74 yo African American women with known history of COPD and tobacco abuse.  She was found to have a groundglass opacity in the left upper lobe.  This was found by CT scan in 2015.  Since then the patient has been routinely followed.  Her most recent scan showed evidence of enlargement and appeared to be more solid.  She was evaluated by Dr. Elsworth Soho and underwent PET CT Scan which showed the nodule to be hypermetabolic.  Due to this it was felt surgical intervention would be indicated.  She was evaluated by Dr. Roxan Hockey who felt the patient would benefit from surgical intervention.  She was offered a VATS procedure and after discussion of risks and benefits  she was agreeable to proceed.  She was further counseled on the importance of smoking cessation as the patient was still smoking at evaluation.  She has been using Chantix and had a target quit date.    Hospital Course:   She presented to [facility] on 07/04/2015.  She underwent Left VATs with left upper lobectomy, mediastinal lymph node dissection, and On-Q anesthetic catheter placement.  She tolerated the procedure, was extubated, and was taken to the SICU in stable condition.  Her chest tubes exhibited an air leak.  She experienced post operative nausea and bradycardia.  Her CXR remained stable and it was felt her anterior chest tube could be removed.  However, she later developed an air leak and follow up CXR showed a pneumothorax, and sub cutaneous emphysema.  Her remaining chest tube was placed back on suction.  Despite this the patient continued to have left sided chest discomfort and worsening of subcutaneous emphysema.  This spread up into her face and it was felt chest tube placement was indicated.  This was done under sterile technique with a 28 French chest tube on her left side.  There was an immediate rush of air once tube was in place.  Follow up film showed re-expansion of the lung.  It was felt her posterior chest tube could be removed.  After this removal the patient developed further extension of her subcutaneous emphysema on the right side of her face.  There was a large air leak in her chest tube.  Her suction was increased to -40 cm.  It was felt due the continued sub cutaneous emphysema  and severe underlying COPD she should undergo placement of Endobronchial valves.  This was done on 07/11/2015 and 4 valves were placed.  The patient tolerated the procedure without difficulty.  Her air leak improved post procedure.  She remained stable and was felt stable for transfer to the step down unit on 07/12/2015.  The patient continued to make progress.  Her subcutaneous emphysema improved.   She continued to have a small air leak, but her lung was fully expanded.  It was felt she could be transitioned to water seal on 07/14/2015. She did require return to suction due to the development of subcutaneous emphysema, but has slowly been weaned back to water seal. Chest x-ray has remained stable and subcutaneous emphysema has resolved.  The chest tube was ultimately discontinued on 07/20/2015. Follow up chest x-ray showed small pntx.  She is ambulating in the halls without difficulty and is tolerating a regular diet.  Incisions are healing well. She has been weaned from supplemental oxygen. The patient has otherwise progressed well and is medically stable on today's date for discharge home.            Significant Diagnostic Studies: nuclear medicine: PET  Hypermetabolic 11 mm central left upper lobe pulmonary nodule, suspicious for bronchogenic carcinoma.  No evidence of thoracic nodal or distant metastatic disease.  Mildly enlarged thyroid with diffuse hypermetabolic activity, suspicious for thyroiditis. Suggest correlation with thyroid function tests.  Treatments: surgery:    Left video-assisted thoracoscopy, thoracoscopic left upper lobectomy, mediastinal lymph node dissection, On-Q local anesthetic, catheter placement.   VIDEO BRONCHOSCOPY WITH INSERTION OF INTERBRONCHIAL VALVE (IBV) (N/A) x 4  Disposition: 01-Home or Self Care   Medication List    TAKE these medications        alum & mag hydroxide-simeth 200-200-20 MG/5ML suspension  Commonly known as:  MAALOX/MYLANTA  Take 30 mLs by mouth as needed for indigestion or heartburn.     aspirin 81 MG tablet  Take 81 mg by mouth daily.     CHANTIX CONTINUING MONTH PAK 1 MG tablet  Generic drug:  varenicline  Take 1 tablet (1 mg total) by mouth 2 (two) times daily.     donepezil 5 MG tablet  Commonly known as:  ARICEPT  Take 5 mg by mouth every evening.     ezetimibe 10 MG tablet  Commonly known as:  ZETIA  Take 10  mg by mouth at bedtime.     mirtazapine 15 MG tablet  Commonly known as:  REMERON  Take 7.5 mg by mouth at bedtime.     niacin 500 MG CR tablet  Commonly known as:  NIASPAN  Take 500 mg by mouth at bedtime.     oxyCODONE 5 MG immediate release tablet  Commonly known as:  Oxy IR/ROXICODONE  Take 1-2 tablets (5-10 mg total) by mouth every 6 (six) hours as needed for severe pain.     pantoprazole 40 MG tablet  Commonly known as:  PROTONIX  Take 40 mg by mouth daily.     SPIRIVA RESPIMAT 2.5 MCG/ACT Aers  Generic drug:  Tiotropium Bromide Monohydrate  INHALE 2 PUFFS INTO THE LUNGS DAILY.     valACYclovir 500 MG tablet  Commonly known as:  VALTREX  Take 500 mg by mouth daily.     XOPENEX HFA 45 MCG/ACT inhaler  Generic drug:  levalbuterol  Inhale 2 puffs into the lungs every 4 (four) hours as needed for wheezing or shortness of breath.  Follow-up Information    Follow up with Melrose Nakayama, MD.   Specialty:  Cardiothoracic Surgery   Why:  2 weeks, office will contact you. also obtain a chest xray at [facility] imaging 1/2 hour before appointment. [facility] is located in the same office complex   Contact information:   [address] [place] [address] [phone]       Signed: Ellwood Handler 07/14/2015, 9:56 AM

## 2015-07-14 NOTE — Progress Notes (Signed)
UR COMPLETED  

## 2015-07-14 NOTE — Progress Notes (Signed)
Paged dr Roxan Hockey, as pt c/o sob and noticed crepitus on left shoulder and neck area, reapplied to suction.

## 2015-07-15 ENCOUNTER — Inpatient Hospital Stay (HOSPITAL_COMMUNITY): Payer: Commercial Managed Care - HMO

## 2015-07-15 NOTE — Consult Note (Signed)
   [facility] CM Inpatient Consult   07/15/2015  [name] [facility] 26-Sep-1940 [id number] Referral received for community care management needs.  Will follow up with inpatient RNCM regarding discharge disposition and needs.  For questions, please contact: [name], RN BSN [facility] Liaison  [phone] business mobile phone

## 2015-07-15 NOTE — Care Management Important Message (Signed)
Important Message  Patient Details  Name: [name] MRN: [id number] Date of Birth: November 06, 1940   Medicare Important Message Given:  Yes-fourth notification given    Nathen May 07/15/2015, 10:24 AM

## 2015-07-15 NOTE — Op Note (Signed)
Shannon, [name]              ACCOUNT NO.:  [id number]  MEDICAL RECORD NO.:  [id number]  LOCATION:  3S10C                        FACILITY:  [facility]  PHYSICIAN:  Revonda Standard. Roxan Hockey, M.D.DATE OF BIRTH:  27-Sep-1940  DATE OF PROCEDURE:  07/14/2015 DATE OF DISCHARGE:                              OPERATIVE REPORT   PREOPERATIVE DIAGNOSIS:  Persistent air leak post left upper lobectomy.  POSTOPERATIVE DIAGNOSIS:  Persistent air leak post left upper lobectomy.  PROCEDURE:  Endobronchial valve placement x 4.  SURGEON:  Revonda Standard. Roxan Hockey, M.D.  ASSISTANT:  None.  ANESTHESIA:  General.  FINDINGS:  Marked decrease in the air leak after endobronchial valve placement.  CLINICAL NOTE:  [name] is a 74 year old woman who recently has undergone a left upper lobectomy for lung cancer.  On postoperative day 4, she developed an acute onset of an air leak with extensive subcutaneous emphysema.  A chest tube was placed.  Despite the chest tube in good position, there was a large persistent air leak, and the patient intermittently had difficulties with subcutaneous emphysema.  She was advised to undergo endobronchial valve placement for management of the air leak.  The indications, risks, benefits, and alternatives were discussed in detail with the patient.  She understood that this was a temporary procedure and the valves will need to be removed.  She also understood there is no guarantee that the air leak would resolve with valve placement.  OPERATIVE NOTE:  Ms. [name] was brought from the ICU to the operating room on July 14, 2015.  With the chest tube on suction, the patient was anesthetized and intubated.  A time-out was performed.  Flexible fiberoptic bronchoscopy was performed via the endotracheal tube.  The left upper lobe stump was intact.  The left lower lobe bronchus was Inspected. There was bloody secretions coming from a subsegmental bronchus in the superior  segmental branch.  This was cleared with saline lavage and suction.  A Fogarty catheter then was inserted through the bronchoscope and the balloon inflated in different locations to assess the effect on the air leak. With occlusion of the left lower lobe bronchus, there was complete cessation of the air leak.  Occlusion of the superior segmental bronchus resulted in a significant decrease in the size of the air leak but did not completely eliminate it.  A 9-mm endobronchial valve was placed in the basilar segmental bronchus in an attempt to identify which of the superior segmental, subsegmental bronchi was most involved, it turned out that both of these needed to be occluded to have a significant effect on the air leak.  This was done with 7 and 6 mm valves respectively.  The 9 mm valve was then removed. The air leak was definitely improved but still was large enough to be of concern.  The basilar segmental bronchi were sequentially occluded with a Fogarty catheter and occluding the lateral and posterior branches seemed to have the most effect on the air leak and 7 mm intrabronchial valves were placed in each of these segmental bronchi after using the calibrated balloon to measure.  The air leak was markedly improved.  The patient then was extubated in the operating room and  taken back to the postanesthetic care unit in good condition.  The chest tube remained in and was placed on 10 cm of suction.     Revonda Standard Roxan Hockey, M.D.     SCH/MEDQ  D:  07/14/2015  T:  07/15/2015  Job:  [id number]

## 2015-07-15 NOTE — Progress Notes (Signed)
4 Days Post-Op Procedure(s) (LRB): VIDEO BRONCHOSCOPY WITH INSERTION OF INTERBRONCHIAL VALVE (IBV) (N/A) Subjective: Some pain at CT site this AM No shortness of breath  Objective: Vital signs in last 24 hours: Temp:  [97.3 F (36.3 C)-99.5 F (37.5 C)] 97.3 F (36.3 C) (11/08 0700) Pulse Rate:  [74-80] 74 (11/08 0315) Cardiac Rhythm:  [-] Normal sinus rhythm (11/08 0315) Resp:  [14-20] 18 (11/08 0315) BP: (104-108)/(61-68) 104/68 mmHg (11/08 0315) SpO2:  [96 %-97 %] 97 % (11/08 0315)  Hemodynamic parameters for last 24 hours:    Intake/Output from previous day: 11/07 0701 - 11/08 0700 In: 120 [P.O.:120] Out: 605 [Urine:575; Chest Tube:30] Intake/Output this shift:    General appearance: alert, cooperative and no distress Neurologic: intact Heart: regular rate and rhythm Lungs: clear to auscultation bilaterally Wound: clean and dry air leak smaller, stops after 1st cough  Lab Results:  Recent Labs  07/13/15 0140  WBC 5.9  HGB 11.3*  HCT 34.4*  PLT 336   BMET:  Recent Labs  07/13/15 0140  NA 137  K 3.5  CL 98*  CO2 31  GLUCOSE 100*  BUN 8  CREATININE 0.80  CALCIUM 8.6*    PT/INR: No results for input(s): LABPROT, INR in the last 72 hours. ABG    Component Value Date/Time   PHART 7.380 07/05/2015 0333   HCO3 26.7* 07/05/2015 0333   TCO2 28 07/05/2015 0333   O2SAT 96.0 07/05/2015 0333   CBG (last 3)   Recent Labs  07/12/15 0914  GLUCAP 90    Assessment/Plan: S/P Procedure(s) (LRB): VIDEO BRONCHOSCOPY WITH INSERTION OF INTERBRONCHIAL VALVE (IBV) (N/A) -  Did not tolerate trial of water seal yesterday Her air leak is still present but does show with every cough, it has improved Will leave on suction today and possibly try water seal again tomorrow Enoxaparin + SCD for DVT prophylaxis Appetite fair Recheck labs in AM   LOS: 11 days    Melrose Nakayama 07/15/2015

## 2015-07-16 ENCOUNTER — Inpatient Hospital Stay (HOSPITAL_COMMUNITY): Payer: Commercial Managed Care - HMO

## 2015-07-16 LAB — COMPREHENSIVE METABOLIC PANEL
ALBUMIN: 2.8 g/dL — AB (ref 3.5–5.0)
ALT: 25 U/L (ref 14–54)
ANION GAP: 9 (ref 5–15)
AST: 31 U/L (ref 15–41)
Alkaline Phosphatase: 63 U/L (ref 38–126)
BUN: 10 mg/dL (ref 6–20)
CHLORIDE: 101 mmol/L (ref 101–111)
CO2: 29 mmol/L (ref 22–32)
Calcium: 8.9 mg/dL (ref 8.9–10.3)
Creatinine, Ser: 0.86 mg/dL (ref 0.44–1.00)
GFR calc Af Amer: >60 mL/min (ref >60)
GFR calc non Af Amer: >60 mL/min (ref >60)
GLUCOSE: 116 mg/dL — AB (ref 65–99)
POTASSIUM: 3.3 mmol/L — AB (ref 3.5–5.1)
SODIUM: 139 mmol/L (ref 135–145)
TOTAL PROTEIN: 6.3 g/dL — AB (ref 6.5–8.1)
Total Bilirubin: 0.5 mg/dL (ref 0.3–1.2)

## 2015-07-16 LAB — CBC
HEMATOCRIT: 36.4 % (ref 36.0–46.0)
Hemoglobin: 12 g/dL (ref 12.0–15.0)
MCH: 32.3 pg (ref 26.0–34.0)
MCHC: 33 g/dL (ref 30.0–36.0)
MCV: 98.1 fL (ref 78.0–100.0)
PLATELETS: 447 10*3/uL — AB (ref 150–400)
RBC: 3.71 MIL/uL — ABNORMAL LOW (ref 3.87–5.11)
RDW: 14.1 % (ref 11.5–15.5)
WBC: 9 10*3/uL (ref 4.0–10.5)

## 2015-07-16 MED ORDER — POTASSIUM CHLORIDE CRYS ER 20 MEQ PO TBCR
20.0000 meq | EXTENDED_RELEASE_TABLET | Freq: Three times a day (TID) | ORAL | Status: AC
  Administered 2015-07-16 (×3): 20 meq via ORAL
  Filled 2015-07-16 (×2): qty 1

## 2015-07-16 MED ORDER — WHITE PETROLATUM GEL
Status: AC
  Administered 2015-07-16: 0.2
  Filled 2015-07-16: qty 1

## 2015-07-16 NOTE — Progress Notes (Signed)
5 Days Post-Op Procedure(s) (LRB): VIDEO BRONCHOSCOPY WITH INSERTION OF INTERBRONCHIAL VALVE (IBV) (N/A) Subjective: Some pain at CT site  Objective: Vital signs in last 24 hours: Temp:  [98.1 F (36.7 C)-99.5 F (37.5 C)] 98.9 F (37.2 C) (11/09 0320) Pulse Rate:  [77-101] 81 (11/09 0320) Cardiac Rhythm:  [-] Normal sinus rhythm (11/09 0320) Resp:  [14-21] 19 (11/09 0320) BP: (106-133)/(59-92) 112/64 mmHg (11/09 0320) SpO2:  [92 %-95 %] 94 % (11/09 0320)  Hemodynamic parameters for last 24 hours:    Intake/Output from previous day: 11/08 0701 - 11/09 0700 In: 240 [P.O.:240] Out: 610 [Urine:600; Chest Tube:10] Intake/Output this shift:    General appearance: alert, cooperative and no distress Neurologic: intact Heart: regular rate and rhythm Lungs: clear to auscultation bilaterally Abdomen: normal findings: soft, non-tender Wound: clean and dry Smaller intermittent air leak this AM Lab Results:  Recent Labs  07/16/15 0313  WBC 9.0  HGB 12.0  HCT 36.4  PLT 447*   BMET:  Recent Labs  07/16/15 0313  NA 139  K 3.3*  CL 101  CO2 29  GLUCOSE 116*  BUN 10  CREATININE 0.86  CALCIUM 8.9    PT/INR: No results for input(s): LABPROT, INR in the last 72 hours. ABG    Component Value Date/Time   PHART 7.380 07/05/2015 0333   HCO3 26.7* 07/05/2015 0333   TCO2 28 07/05/2015 0333   O2SAT 96.0 07/05/2015 0333   CBG (last 3)  No results for input(s): GLUCAP in the last 72 hours.  Assessment/Plan: S/P Procedure(s) (LRB): VIDEO BRONCHOSCOPY WITH INSERTION OF INTERBRONCHIAL VALVE (IBV) (N/A) -  Prolonged air leak post lobectomy. Bronchial valves placed.  Her air leak has improved but not completely resolved. Will keep tube on suction again today since we are making some progress. Water seal tomorrow if leak stable or decreased Needs to ambulate more. Has been up in room but needs to get out in hall Hypokalemia- replete K PO SCD + enoxaparin for DVT  prophylaxis   LOS: 12 days    Melrose Nakayama 07/16/2015

## 2015-07-16 NOTE — Progress Notes (Signed)
UR COMPLETED  

## 2015-07-17 ENCOUNTER — Inpatient Hospital Stay (HOSPITAL_COMMUNITY): Payer: Commercial Managed Care - HMO

## 2015-07-17 MED ORDER — GUAIFENESIN ER 600 MG PO TB12
1200.0000 mg | ORAL_TABLET | Freq: Two times a day (BID) | ORAL | Status: AC
  Administered 2015-07-17 – 2015-07-19 (×6): 1200 mg via ORAL
  Filled 2015-07-17 (×7): qty 2

## 2015-07-17 NOTE — Progress Notes (Signed)
Patient placed to water seal per order. When patient was reexamined, patient had redeveloped subcutaneous air. Crepitus was palpated from left underarm to upper portion of left chest. Patient was placed back on suction. Suzzanne Cloud, PA was called and informed of situation. Per Barnett Applebaum, ok to leave on 10 cm of suction.

## 2015-07-17 NOTE — Progress Notes (Addendum)
       [address]       [place] [address]             [phone]          6 Days Post-Op Procedure(s) (LRB): VIDEO BRONCHOSCOPY WITH INSERTION OF INTERBRONCHIAL VALVE (IBV) (N/A)  Subjective: Sore under arm and at CT site, but otherwise feels well.   Objective: Vital signs in last 24 hours: Patient Vitals for the past 24 hrs:  BP Temp Temp src Pulse Resp SpO2  07/17/15 0345 (!) 105/53 mmHg - - 64 16 98 %  07/17/15 0342 - 97.7 F (36.5 C) Oral - - -  07/17/15 0034 115/66 mmHg 98.5 F (36.9 C) Oral 77 14 99 %  07/16/15 2033 113/65 mmHg 98.4 F (36.9 C) Oral 83 18 98 %  07/16/15 1123 106/65 mmHg 97.7 F (36.5 C) Oral 82 16 93 %  07/16/15 0856 - - - - - 93 %   Current Weight  07/11/15 132 lb 0.9 oz (59.9 kg)     Intake/Output from previous day: 11/09 0701 - 11/10 0700 In: 360 [P.O.:360] Out: 0     PHYSICAL EXAM:  Heart: RRR Lungs: Clear, small amount SQ air along L chest wall Wound: Clean and dry Chest tube: No air leak     Lab Results: CBC: Recent Labs  07/16/15 0313  WBC 9.0  HGB 12.0  HCT 36.4  PLT 447*   BMET:  Recent Labs  07/16/15 0313  NA 139  K 3.3*  CL 101  CO2 29  GLUCOSE 116*  BUN 10  CREATININE 0.86  CALCIUM 8.9    PT/INR: No results for input(s): LABPROT, INR in the last 72 hours.  CXR: FINDINGS: The volume of subcutaneous emphysema over the thorax has decreased somewhat. The left-sided chest tube is stable with its tip in the pulmonary apex. A discrete pleural line is not observed. There is persistent density at the left lung base. A small amount of pneumomediastinum superiorly persists. The right lung is adequately inflated. The interstitial markings remain coarse. The heart is normal in size. The pulmonary vascularity is not engorged. Radiodense intrabronchial valves are visible.  IMPRESSION: Slight interval decrease in the subcutaneous emphysema. No definite pneumothorax. Residual  pneumomediastinum. Stable left basilar atelectasis.   Assessment/Plan: S/P Procedure(s) (LRB): VIDEO BRONCHOSCOPY WITH INSERTION OF INTERBRONCHIAL VALVE (IBV) (N/A) CT does not appear to have an air leak with cough today.CXR stable with no ptx and decreased SQ air. Hopefully can place on water seal today. Continue ambulation, pulm toilet.   LOS: 13 days    COLLINS,GINA H 07/17/2015 Patient seen and examined, agree with above No air leak- will try on water seal C/o thick mucous- will add mucinex  Remo Lipps C. Roxan Hockey, MD [facility] [phone]

## 2015-07-18 ENCOUNTER — Inpatient Hospital Stay (HOSPITAL_COMMUNITY): Payer: Commercial Managed Care - HMO

## 2015-07-18 LAB — BASIC METABOLIC PANEL
ANION GAP: 9 (ref 5–15)
BUN: 8 mg/dL (ref 6–20)
CALCIUM: 8.8 mg/dL — AB (ref 8.9–10.3)
CO2: 30 mmol/L (ref 22–32)
Chloride: 102 mmol/L (ref 101–111)
Creatinine, Ser: 0.9 mg/dL (ref 0.44–1.00)
Glucose, Bld: 88 mg/dL (ref 65–99)
POTASSIUM: 4.1 mmol/L (ref 3.5–5.1)
SODIUM: 141 mmol/L (ref 135–145)

## 2015-07-18 NOTE — Care Management Important Message (Signed)
Important Message  Patient Details  Name: [name] MRN: [id number] Date of Birth: [birth year]   Medicare Important Message Given:  Yes    Tivis Wherry P Gar[place] 07/18/2015, 2:23 PM

## 2015-07-18 NOTE — Progress Notes (Signed)
Patient has been getting up and walking to the bathroom with assistance. Patient has declined walking in the hallways today so far due to pain. Patient was given pain medications and offered support. Patient agreed that she would walk this evening. We will continue to encourage mobilization.

## 2015-07-18 NOTE — Progress Notes (Addendum)
       [address]       [place] [address]             [phone]          7 Days Post-Op Procedure(s) (LRB): VIDEO BRONCHOSCOPY WITH INSERTION OF INTERBRONCHIAL VALVE (IBV) (N/A)  Subjective: Developed increased SQ air in chest and neck yesterday when CT placed to water seal. Pt asymptomatic otherwise and breathing stable. Back on suction, no complaints this am.   Objective: Vital signs in last 24 hours: Patient Vitals for the past 24 hrs:  BP Temp Temp src Pulse Resp SpO2  07/18/15 0428 (!) 105/52 mmHg 98.1 F (36.7 C) Oral 71 15 97 %  07/17/15 2309 109/71 mmHg 98.3 F (36.8 C) Oral 71 15 97 %  07/17/15 1945 102/62 mmHg 98.4 F (36.9 C) Oral 76 15 100 %  07/17/15 1530 (!) 104/56 mmHg - - 83 15 92 %  07/17/15 1500 - 98.6 F (37 C) Oral - - -  07/17/15 1200 - 97.6 F (36.4 C) Oral - - -  07/17/15 1132 (!) 108/56 mmHg - - 77 15 91 %  07/17/15 0949 - - - - - 98 %  07/17/15 0800 91/66 mmHg 98.3 F (36.8 C) Oral 76 (!) 23 99 %   Current Weight  07/11/15 132 lb 0.9 oz (59.9 kg)     Intake/Output from previous day: 11/10 0701 - 11/11 0700 In: 960 [P.O.:960] Out: 20 [Chest Tube:20]    PHYSICAL EXAM:  Heart: RRR Lungs: Clear Wound: Clean and dry, small amount of subcutaneous emphysema in upper chest and shoulder on left Chest tube: No air leak even with forceful cough    Lab Results: CBC: Recent Labs  07/16/15 0313  WBC 9.0  HGB 12.0  HCT 36.4  PLT 447*   BMET:  Recent Labs  07/16/15 0313 07/18/15 0408  NA 139 141  K 3.3* 4.1  CL 101 102  CO2 29 30  GLUCOSE 116* 88  BUN 10 8  CREATININE 0.86 0.90  CALCIUM 8.9 8.8*    PT/INR: No results for input(s): LABPROT, INR in the last 72 hours.  CXR: FINDINGS: Postsurgical changes are again seen in the left lung. Cardiac silhouette is within normal limits. Left chest tube remains in place with prominent soft tissue emphysema in the chest wall and the visualized lower neck. The  small left-sided pneumothorax seen on the prior study is not definitely identified. Left basilar opacities have not significantly changed and may reflect a combination of atelectasis and possible small effusion.  IMPRESSION: 1. Left chest tube remains in place. No definite pneumothorax identified. 2. Unchanged left basilar opacity which may reflect atelectasis and a small pleural effusion.   Assessment/Plan: S/P Procedure(s) (LRB): VIDEO BRONCHOSCOPY WITH INSERTION OF INTERBRONCHIAL VALVE (IBV) (N/A) CXR overall stable, SQ air improved on suction.  No air leak with cough. Possibly can try CT on water seal again today.    LOS: 14 days    COLLINS,GINA H 07/18/2015  Patient seen and examined, agree with above  She has not had any air leak for several days. Will keep on suction today, and try water seal again tomorrow  Remo Lipps C. Roxan Hockey, MD [facility] [phone]

## 2015-07-19 ENCOUNTER — Inpatient Hospital Stay (HOSPITAL_COMMUNITY): Payer: Commercial Managed Care - HMO

## 2015-07-19 NOTE — Progress Notes (Addendum)
       [address]       [place], [address]             [phone]          8 Days Post-Op Procedure(s) (LRB): VIDEO BRONCHOSCOPY WITH INSERTION OF INTERBRONCHIAL VALVE (IBV) (N/A)  Subjective: Feels well, breathing stable.   Objective: Vital signs in last 24 hours: Patient Vitals for the past 24 hrs:  BP Temp Temp src Pulse Resp SpO2  07/19/15 0700 - 98 F (36.7 C) Oral - - -  07/19/15 0346 109/78 mmHg 98 F (36.7 C) Oral 70 19 98 %  07/18/15 2358 125/75 mmHg 97.9 F (36.6 C) Oral 73 14 98 %  07/18/15 2041 110/65 mmHg 98.3 F (36.8 C) Oral 71 16 97 %  07/18/15 1610 - 98.5 F (36.9 C) Oral - - -  07/18/15 1100 - 98 F (36.7 C) Oral - - -  07/18/15 1002 - - - - - 97 %   Current Weight  07/11/15 132 lb 0.9 oz (59.9 kg)     Intake/Output from previous day: 11/11 0701 - 11/12 0700 In: 240 [P.O.:240] Out: 5 [Chest Tube:5]    PHYSICAL EXAM:  Heart: RRR Lungs: Clear Wound: Clean and dry, minimal SQ air in L shoulder, none in chest or neck Chest tube: No air leak    Lab Results: CBC:No results for input(s): WBC, HGB, HCT, PLT in the last 72 hours. BMET:  Recent Labs  07/18/15 0408  NA 141  K 4.1  CL 102  CO2 30  GLUCOSE 88  BUN 8  CREATININE 0.90  CALCIUM 8.8*    PT/INR: No results for input(s): LABPROT, INR in the last 72 hours.    Assessment/Plan: S/P Procedure(s) (LRB): VIDEO BRONCHOSCOPY WITH INSERTION OF INTERBRONCHIAL VALVE (IBV) (N/A) CXR stable, CT with no air leak. SQ air minimal at this point. Will place CT to water seal and watch closely. Continue ambulation, pulm toilet.   LOS: 15 days    COLLINS,GINA H 07/19/2015  Patient seen and examined, agree with above No air leak on water seal  Remo Lipps C. Roxan Hockey, MD [facility] [phone]

## 2015-07-20 ENCOUNTER — Inpatient Hospital Stay (HOSPITAL_COMMUNITY): Payer: Commercial Managed Care - HMO

## 2015-07-20 MED ORDER — POLYETHYLENE GLYCOL 3350 17 G PO PACK
17.0000 g | PACK | Freq: Every day | ORAL | Status: DC | PRN
  Administered 2015-07-20: 17 g via ORAL
  Filled 2015-07-20: qty 1

## 2015-07-20 MED ORDER — BISACODYL 10 MG RE SUPP: 10.0000 mg | Freq: Every day | RECTAL | Status: DC | PRN

## 2015-07-20 NOTE — Progress Notes (Addendum)
       [address]       [place] [address]             [phone]          9 Days Post-Op Procedure(s) (LRB): VIDEO BRONCHOSCOPY WITH INSERTION OF INTERBRONCHIAL VALVE (IBV) (N/A)  Subjective: Comfortable, no complaints.  Breathing stable. Walked more in halls yesterday.   Objective: Vital signs in last 24 hours: Patient Vitals for the past 24 hrs:  BP Temp Temp src Pulse Resp SpO2  07/20/15 0753 - 98.3 F (36.8 C) Oral - - -  07/20/15 0414 102/66 mmHg 98.1 F (36.7 C) Oral 68 13 99 %  07/19/15 2306 120/64 mmHg 98.6 F (37 C) Oral 87 (!) 21 100 %  07/19/15 2027 120/65 mmHg 97.9 F (36.6 C) Oral 83 17 98 %  07/19/15 1623 117/67 mmHg 98.8 F (37.1 C) Oral 79 (!) 21 99 %  07/19/15 1228 106/73 mmHg 97.6 F (36.4 C) Oral 76 14 100 %  07/19/15 0902 - - - - - 97 %   Current Weight  07/11/15 132 lb 0.9 oz (59.9 kg)     Intake/Output from previous day: 11/12 0701 - 11/13 0700 In: 800 [P.O.:800] Out: 30 [Chest Tube:30]    PHYSICAL EXAM:  Heart: RRR Lungs: Clear Wound: Clean and dry Chest tube: No air leak    Lab Results: CBC:No results for input(s): WBC, HGB, HCT, PLT in the last 72 hours. BMET:  Recent Labs  07/18/15 0408  NA 141  K 4.1  CL 102  CO2 30  GLUCOSE 88  BUN 8  CREATININE 0.90  CALCIUM 8.8*    PT/INR: No results for input(s): LABPROT, INR in the last 72 hours.    Assessment/Plan: S/P Procedure(s) (LRB): VIDEO BRONCHOSCOPY WITH INSERTION OF INTERBRONCHIAL VALVE (IBV) (N/A) CXR stable, CT with no air leak.  Will d/c CT today. Continue pulm toilet and wean O2. Ambulate in halls. Possibly home in next day or so if she remains stable.   LOS: 16 days    COLLINS,GINA H 07/20/2015  Patient seen and examined, agree with above  Will dc CT then observe overnight  Remo Lipps C. Roxan Hockey, MD [facility] [phone]

## 2015-07-21 ENCOUNTER — Other Ambulatory Visit: Payer: Self-pay

## 2015-07-21 ENCOUNTER — Encounter (HOSPITAL_COMMUNITY): Payer: Self-pay | Admitting: Thoracic Surgery (Cardiothoracic Vascular Surgery)

## 2015-07-21 ENCOUNTER — Inpatient Hospital Stay (HOSPITAL_COMMUNITY): Payer: Commercial Managed Care - HMO

## 2015-07-21 MED ORDER — ALUM & MAG HYDROXIDE-SIMETH 200-200-20 MG/5ML PO SUSP
30.0000 mL | ORAL | Status: DC | PRN
  Administered 2015-07-21 (×2): 30 mL via ORAL
  Filled 2015-07-21 (×2): qty 30

## 2015-07-21 NOTE — Care Management Important Message (Signed)
Important Message  Patient Details  Name: [name] MRN: [id number] Date of Birth: 10/25/1940   Medicare Important Message Given:  Yes    [name] P [place] 07/21/2015, 2:45 PM

## 2015-07-21 NOTE — Progress Notes (Addendum)
10 Days Post-Op Procedure(s) (LRB): VIDEO BRONCHOSCOPY WITH INSERTION OF INTERBRONCHIAL VALVE (IBV) (N/A) Subjective: Primary c/o is nausea and acid reflux. Breathing is comfortable  Objective: Vital signs in last 24 hours: Temp:  [98.1 F (36.7 C)-98.6 F (37 C)] 98.4 F (36.9 C) (11/14 0345) Pulse Rate:  [64-78] 65 (11/14 0347) Cardiac Rhythm:  [-] Normal sinus rhythm (11/13 2009) Resp:  [12-16] 13 (11/14 0347) BP: (102-120)/(60-72) 102/69 mmHg (11/14 0347) SpO2:  [92 %-100 %] 99 % (11/14 0347)  Hemodynamic parameters for last 24 hours:    Intake/Output from previous day: 11/13 0701 - 11/14 0700 In: 240 [P.O.:240] Out: 0  Intake/Output this shift:    General appearance: alert, cooperative and no distress Heart: regular rate and rhythm Lungs: clear to auscultation bilaterally Abdomen: benign Extremities: no edema or calf tenderness Wound: incis healing well  Lab Results: No results for input(s): WBC, HGB, HCT, PLT in the last 72 hours. BMET: No results for input(s): NA, K, CL, CO2, GLUCOSE, BUN, CREATININE, CALCIUM in the last 72 hours.  PT/INR: No results for input(s): LABPROT, INR in the last 72 hours. ABG    Component Value Date/Time   PHART 7.380 07/05/2015 0333   HCO3 26.7* 07/05/2015 0333   TCO2 28 07/05/2015 0333   O2SAT 96.0 07/05/2015 0333   CBG (last 3)  No results for input(s): GLUCAP in the last 72 hours.  Meds Scheduled Meds: . antiseptic oral rinse  7 mL Mouth Rinse BID  . aspirin EC  81 mg Oral Daily  . bisacodyl  10 mg Oral Daily  . donepezil  5 mg Oral QPM  . enoxaparin (LOVENOX) injection  40 mg Subcutaneous Q24H  . ezetimibe  10 mg Oral QHS  . feeding supplement (ENSURE ENLIVE)  237 mL Oral BID BM  . mirtazapine  7.5 mg Oral QHS  . niacin  500 mg Oral Q24H  . pantoprazole  40 mg Oral Daily  . senna-docusate  1 tablet Oral QHS  . tiotropium  18 mcg Inhalation Daily  . valACYclovir  500 mg Oral Daily  . varenicline  1 mg Oral BID    Continuous Infusions:  PRN Meds:.bisacodyl, fentaNYL (SUBLIMAZE) injection, levalbuterol, ondansetron (ZOFRAN) IV, oxyCODONE, polyethylene glycol, potassium chloride, potassium chloride, potassium chloride, sodium chloride, traMADol  Xrays Dg Chest Port 1 View  07/20/2015  CLINICAL DATA:  74 year old female with intrabronchial valves placement. EXAM: PORTABLE CHEST 1 VIEW COMPARISON:  07/19/2015 and prior radiographs FINDINGS: Cardiomediastinal silhouette is unchanged. Left interbronchial valves noted. A left thoracostomy tube is unchanged. There is no evidence of pneumothorax. Left basilar atelectasis again identified as well as left subcutaneous emphysema. There has been little interval change since the prior study. IMPRESSION: Unchanged appearance of the chest as described. Continued left basilar atelectasis. No evidence of pneumothorax. Electronically Signed   By: Margarette Canada M.D.   On: 07/20/2015 08:39    Assessment/Plan: S/P Procedure(s) (LRB): VIDEO BRONCHOSCOPY WITH INSERTION OF INTERBRONCHIAL VALVE (IBV) (N/A)  1 overall doing well but felt  like she is going to vomit most of night. She is on protonic, will add mylanta. 2 she was on O2 last night but sats seem to be fine - take off 3 CXR without significant pntx 4 poss home later today   LOS: 17 days    GOLD,WAYNE E 07/21/2015  Patient seen and examined, agree with above Home later today if able to take [place]. Roxan Hockey, MD [facility] [phone]

## 2015-07-21 NOTE — Patient Outreach (Signed)
[facility]) Care Management  07/21/2015  [name] 05-20-1941 [id number]   Referral from [name], RN to assign [name], assigned [name], Therapist, sports.  Thanks, [name]. [place], [place] Assistant Phone: [phone] Fax: [phone]

## 2015-07-21 NOTE — Consult Note (Signed)
   [facility] CM Inpatient Consult   07/21/2015  [name] June 04, 1941 [id number] Patient evaluated for community based chronic disease management services with [place] [facility] as a benefit of patient's [facility]. Spoke with patient at bedside to explain [employer] Management services. Patient states she is feeling some better today.  She was hopeful to go home later today but was having some nausea.  Patient states she resides with her nephew. Consent form signed.  Patient will receive post hospital discharge call and will be evaluated for monthly home visits for assessments and disease process education.  Left contact information and [facility] literature at bedside. Had spoken with Levada Dy,  Inpatient Case Manager aware that [place] Management following last week. Of note, [facility] Care Management services does not replace or interfere with any services that are arranged by inpatient case management or social work.  For additional questions or referrals please contact:  [name], RN BSN [facility] Liaison  [phone] business mobile phone

## 2015-07-22 MED ORDER — OXYCODONE HCL 5 MG PO TABS: 5.0000 mg | ORAL_TABLET | Freq: Four times a day (QID) | ORAL | Status: DC | PRN

## 2015-07-22 MED ORDER — ALUM & MAG HYDROXIDE-SIMETH 200-200-20 MG/5ML PO SUSP: 30.0000 mL | ORAL | Status: DC | PRN

## 2015-07-22 NOTE — Progress Notes (Signed)
Removed sutures per MD order and placed steri strips, pt tolerated well. Site is clean and dry. Consuelo Pandy RN

## 2015-07-22 NOTE — Progress Notes (Addendum)
11 Days Post-Op Procedure(s) (LRB): VIDEO BRONCHOSCOPY WITH INSERTION OF INTERBRONCHIAL VALVE (IBV) (N/A) Subjective: Looks and feels better, nausea resolved  Objective: Vital signs in last 24 hours: Temp:  [97.9 F (36.6 C)-99.2 F (37.3 C)] 98.1 F (36.7 C) (11/15 0255) Pulse Rate:  [71-90] 79 (11/15 0255) Cardiac Rhythm:  [-] Normal sinus rhythm (11/15 0255) Resp:  [12-19] 12 (11/15 0255) BP: (99-118)/(57-71) 107/71 mmHg (11/15 0255) SpO2:  [91 %-99 %] 93 % (11/15 0255)  Hemodynamic parameters for last 24 hours:    Intake/Output from previous day: 11/14 0701 - 11/15 0700 In: 120 [P.O.:120] Out: -  Intake/Output this shift:    General appearance: alert, cooperative and no distress Heart: regular rate and rhythm Lungs: clear to auscultation bilaterally Abdomen: benign Extremities: no edema Wound: incis healing well  Lab Results: No results for input(s): WBC, HGB, HCT, PLT in the last 72 hours. BMET: No results for input(s): NA, K, CL, CO2, GLUCOSE, BUN, CREATININE, CALCIUM in the last 72 hours.  PT/INR: No results for input(s): LABPROT, INR in the last 72 hours. ABG    Component Value Date/Time   PHART 7.380 07/05/2015 0333   HCO3 26.7* 07/05/2015 0333   TCO2 28 07/05/2015 0333   O2SAT 96.0 07/05/2015 0333   CBG (last 3)  No results for input(s): GLUCAP in the last 72 hours.  Meds Scheduled Meds: . antiseptic oral rinse  7 mL Mouth Rinse BID  . aspirin EC  81 mg Oral Daily  . bisacodyl  10 mg Oral Daily  . donepezil  5 mg Oral QPM  . enoxaparin (LOVENOX) injection  40 mg Subcutaneous Q24H  . ezetimibe  10 mg Oral QHS  . feeding supplement (ENSURE ENLIVE)  237 mL Oral BID BM  . mirtazapine  7.5 mg Oral QHS  . niacin  500 mg Oral Q24H  . pantoprazole  40 mg Oral Daily  . senna-docusate  1 tablet Oral QHS  . tiotropium  18 mcg Inhalation Daily  . valACYclovir  500 mg Oral Daily  . varenicline  1 mg Oral BID   Continuous Infusions:  PRN Meds:.alum & mag  hydroxide-simeth, bisacodyl, fentaNYL (SUBLIMAZE) injection, levalbuterol, ondansetron (ZOFRAN) IV, oxyCODONE, polyethylene glycol, potassium chloride, potassium chloride, potassium chloride, sodium chloride, traMADol  Xrays Dg Chest 2 View  07/21/2015  CLINICAL DATA:  Follow-up left-sided pneumothorax. Interval chest tube removal, lung malignancy status post lobectomy on the left. EXAM: CHEST  2 VIEW COMPARISON:  Portable chest x-ray of July 20, 2015 FINDINGS: The left-sided chest tube has been removed. There remains volume loss posteriorly at the left lung base. Some residual pleural space air is noted in the retrosternal region on the lateral image. There remains considerable subcutaneous emphysema on the left. The right lung is well-expanded. The interstitial markings are coarse especially inferiorly but are stable. The heart and pulmonary vascularity are normal. There are interbronchial valvepresent on the left. IMPRESSION: Small anterior left pneumothorax with persistent left-sided subcutaneous emphysema. Persistent left basilar atelectasis or infiltrate. There is no large pleural effusion. Stable appearance of the heart, mediastinum, and right lung. Electronically Signed   By: David  [place] M.D.   On: 07/21/2015 08:02    Assessment/Plan: S/P Procedure(s) (LRB): VIDEO BRONCHOSCOPY WITH INSERTION OF INTERBRONCHIAL VALVE (IBV) (N/A) Plan for discharge: see discharge orders   LOS: 18 days    GOLD,WAYNE E 07/22/2015  Patient seen and examined, agree with above  Remo Lipps C. Roxan Hockey, MD [facility] [phone]

## 2015-07-22 NOTE — Care Management Note (Signed)
Case Management Note  Patient Details  Name: [name] MRN: [id number] Date of Birth: [birth year]  Subjective/Objective:           Video Bronchoscopy with insertion of Interbronchial valve         Action/Plan: NCM spoke to pt and states her oldest dtr, [name] will assist her at home as needed. Pt reports she is independent at home with minimal assistance needed. Pt requesting RW for home. Contacted [facility] for DME for home.   Expected Discharge Date:  07/22/2015               Expected Discharge Plan:  Home/Self Care  In-House Referral:     Discharge planning Services  CM Consult  Post Acute Care Choice:    Choice offered to:     DME Arranged:  [place] [facility] DME Agency:  [facility]   Status of Service:  Completed, signed off  Medicare Important Message Given:  Yes Date Medicare IM Given:    Medicare IM give by:    Date Additional Medicare IM Given:    Additional Medicare Important Message give by:     If discussed at [place] of Stay Meetings, dates discussed:    Additional Comments:  Erenest Rasher, RN 07/22/2015, 10:27 AM

## 2015-07-23 ENCOUNTER — Other Ambulatory Visit: Payer: Self-pay | Admitting: *Deleted

## 2015-07-23 NOTE — Patient Outreach (Signed)
Referral received from hospital liaison. She was recently admitted to hospital for left lung nodule and video assisted thoracoscopy.  According to chart, member also has history of COPD and malnutrition.  She was discharged yesterday, 11/15.  Call placed to member to introduce [facility] care management services and initiate transition of care program.  Identity verified, this care manger introduces self and explains [facility] care management services.  She agrees to participate.  She reports that she has been doing "fine" since discharge yesterday.  She rates her pain level at 3/10, and states that she has not had to take anything for pain.  She denies any shortness of breath.  Medications reviewed, she reports taking them all as prescribed.   Member denies the need for home health, stating that she is able to care for herself.  She states that she is able to transport herself to her physician appointments without problems and denies the need for social worker assistance.  Member has follow up appointment with ENT specialist next week and an appointment with the cardiac surgeon on 11/29.  She has yet to schedule an appointment with her PCP.  Encouraged to contact for follow up appointment.  She states that she will not know the plan of care to treat her newly found lung cancer until after her follow up appointments.  She agrees to a home visit, which is scheduled for 11/28.  She denies any urgent concerns or needs at this time.  Member provided with contact information with this care manager, encouraged to contact for any questions.   [name], BSN, [place] [facility]  [facility] Care Manager [phone]

## 2015-08-01 ENCOUNTER — Other Ambulatory Visit: Payer: Self-pay | Admitting: *Deleted

## 2015-08-01 ENCOUNTER — Ambulatory Visit: Payer: Commercial Managed Care - HMO | Admitting: *Deleted

## 2015-08-01 NOTE — Patient Outreach (Signed)
Weekly transition of care call placed to member.  Spoke with daughter, Synetta Shadow, and she reports that member is sleeping.  Message left for member to give a call back to this care manger.  Will await call back.  [name], BSN, [place] [facility]  [facility] Care Manager [phone]

## 2015-08-04 ENCOUNTER — Other Ambulatory Visit: Payer: Self-pay | Admitting: *Deleted

## 2015-08-04 ENCOUNTER — Other Ambulatory Visit: Payer: Self-pay | Admitting: Thoracic Surgery (Cardiothoracic Vascular Surgery)

## 2015-08-04 ENCOUNTER — Encounter: Payer: Self-pay | Admitting: *Deleted

## 2015-08-04 DIAGNOSIS — C3490 Malignant neoplasm of unspecified part of unspecified bronchus or lung: Secondary | ICD-10-CM

## 2015-08-05 ENCOUNTER — Encounter: Payer: Self-pay | Admitting: Thoracic Surgery (Cardiothoracic Vascular Surgery)

## 2015-08-05 ENCOUNTER — Ambulatory Visit
Admission: RE | Admit: 2015-08-05 | Discharge: 2015-08-05 | Disposition: A | Discharge status: Home / Self Care | Payer: Commercial Managed Care - HMO | Source: Ambulatory Visit | Attending: Thoracic Surgery (Cardiothoracic Vascular Surgery) | Admitting: Thoracic Surgery (Cardiothoracic Vascular Surgery)

## 2015-08-05 ENCOUNTER — Ambulatory Visit (INDEPENDENT_AMBULATORY_CARE_PROVIDER_SITE_OTHER): Payer: Self-pay | Admitting: Thoracic Surgery (Cardiothoracic Vascular Surgery)

## 2015-08-05 ENCOUNTER — Other Ambulatory Visit: Payer: Self-pay | Admitting: *Deleted

## 2015-08-05 VITALS — BP 154/74 | HR 64 | Resp 20 | Ht 63.0 in | Wt 127.0 lb

## 2015-08-05 DIAGNOSIS — C3490 Malignant neoplasm of unspecified part of unspecified bronchus or lung: Secondary | ICD-10-CM

## 2015-08-05 DIAGNOSIS — R0602 Shortness of breath: Secondary | ICD-10-CM | POA: Diagnosis not present

## 2015-08-05 DIAGNOSIS — C801 Malignant (primary) neoplasm, unspecified: Secondary | ICD-10-CM

## 2015-08-05 MED ORDER — OXYCODONE HCL 5 MG PO TABS: 5.0000 mg | ORAL_TABLET | Freq: Four times a day (QID) | ORAL | Status: DC | PRN

## 2015-08-05 NOTE — Progress Notes (Signed)
[address]       [place] [address]             [phone]       HPI: [name] returns for a scheduled postoperative follow-up visit.  She is a 74 year old woman who had a thoracoscopic left upper lobectomy on 07/04/2015 for a stage IB (T2a, N0) adenocarcinoma. Her postoperative course was complicated by prolonged air leak. She initially did not have an air leak and her chest tubes were removed on day 3. However she had to have a chest tube replaced the following day after she developed severe saphenous emphysema. She had a large air leak that persisted. Eventually we did endobronchial valves on 07/11/2015. After that her air leak resolved and her chest tube was removed. She was finally discharged home on 07/22/2015.  Since discharge she is noted that her breathing is not back to her preoperative baseline. That she has not had any episodes of wheezing or severe shortness of breath. She says she feels aware that part of her lung is missing. She does still have a lot of incisional pain and is taking oxycodone on a regular basis. Her primary complaints are worsening of her chronic reflux and constipation.  Past Medical History  Diagnosis Date  . Hyperlipidemia   . Asthma   . COPD (chronic obstructive pulmonary disease) ([place])   . Shortness of breath   . GERD (gastroesophageal reflux disease)   . Thyroid nodule   . Depression   . Arthritis       Current Outpatient Prescriptions  Medication Sig Dispense Refill  . alum & mag hydroxide-simeth (MAALOX/MYLANTA) 200-200-20 MG/5ML suspension Take 30 mLs by mouth as needed for indigestion or heartburn. 355 mL 0  . aspirin 81 MG tablet Take 81 mg by mouth daily.    Hendricks Limes CONTINUING MONTH PAK 1 MG tablet Take 1 tablet (1 mg total) by mouth 2 (two) times daily. 60 tablet 2  . donepezil (ARICEPT) 5 MG tablet Take 5 mg by mouth every evening.  0  . ezetimibe (ZETIA) 10 MG tablet Take 10 mg by mouth at bedtime.       . mirtazapine (REMERON) 15 MG tablet Take 7.5 mg by mouth at bedtime.   0  . niacin (NIASPAN) 500 MG CR tablet Take 500 mg by mouth at bedtime.     Marland Kitchen oxyCODONE (OXY IR/ROXICODONE) 5 MG immediate release tablet Take 1 tablet (5 mg total) by mouth every 6 (six) hours as needed for severe pain. 40 tablet 0  . pantoprazole (PROTONIX) 40 MG tablet Take 40 mg by mouth 2 (two) times daily.    Marland Kitchen SPIRIVA RESPIMAT 2.5 MCG/ACT AERS INHALE 2 PUFFS INTO THE LUNGS DAILY. 4 g 3  . valACYclovir (VALTREX) 500 MG tablet Take 500 mg by mouth daily.    Penne Lash HFA 45 MCG/ACT inhaler Inhale 2 puffs into the lungs every 4 (four) hours as needed for wheezing or shortness of breath.   1   No current facility-administered medications for this visit.    Physical Exam BP 154/74 mmHg  Pulse 64  Resp 20  Ht '5\' 3"'$  (1.6 m)  Wt 127 lb (57.607 kg)  BMI 22.50 kg/m2  SpO23 63% 74 year old woman in no acute distress Alert and oriented 3 with no focal neurologic deficit Obvious discomfort with movement Incisions healing well Cardiac regular rate and rhythm normal S1 and S2 Lungs slightly diminished left base, otherwise clear  Diagnostic Tests: I personally reviewed her chest x-ray. It shows postoperative changes from a left upper lobectomy. Endobronchial valves are in place.  Impression: 74 year old woman who underwent a thoracoscopic left upper lobectomy for a stage IB non-small cell carcinoma. Her surgery was about a month ago. She did have a prolonged air leak and had to have endobronchial valves placed. After that her air leak resolved. She does still have some incisional pain, which is not unusual. That should improve with time. I did give her another prescription for oxycodone 5 mg tablets, one to 2 tablets 4 times daily as needed for pain, dispense 30, no refills. If she needs additional pain medication she can just call the office and we will take care of that for her.  She has chronic reflux. She says that  has worsened since her surgery. That does not surprise me. I recommended that she go back to taking the Protonix twice daily. She continued to use Maalox as needed when necessary.  She complains of constipation. That likely is aggravated by the narcotics. She may use over-the-counter stool softeners as needed for that.  She has a stage IB non-small cell carcinoma. I'm going to refer her to our multispecialty thoracic oncology clinic see oncology regarding recommendations for that. Typically she would not need chemotherapy given the tumor size 1.6 cm.  We placed endobronchial valves on November 4. Those need to come out in the 6-8 weeks timeframe. She would like to wait until after the holidays. We will plan to do bronchoscopy and endobronchial valve removal on 09/10/2015. We discussed the indications, risks, benefits, and alternatives. She understands the risk include those associated with anesthesia, bleeding, and possible recurrent pneumothorax.  Plan: Referral to [facility] to see Dr. Julien Nordmann  Endo bronchial valve removal on 09/10/2015  Melrose Nakayama, MD [facility] [phone]

## 2015-08-05 NOTE — Patient Outreach (Signed)
[facility]) Care Management   08/05/2015  Sherle Mello Varma 1940-09-13 [id number]  [name] is an 74 y.o. female  Subjective:   Member states that she has been "getting a little stronger every day."  She complains of post-surgical pain in which she is taking narcotics for pain relief, but it has caused some constipation.  She reports that she has been taking over the counter stool softeners with minimal relief.  Objective:   Review of Systems  Constitutional:       Related to surgery   HENT: Negative.   Eyes: Negative.   Respiratory: Positive for shortness of breath.   Cardiovascular: Negative.   Gastrointestinal: Positive for constipation.  Genitourinary: Negative.   Musculoskeletal: Negative.   Skin: Negative.   Neurological: Negative.   Endo/Heme/Allergies: Negative.   Psychiatric/Behavioral: Negative.     Physical Exam  Constitutional: She is oriented to person, place, and time. She appears well-developed and well-nourished.  Neck: Normal range of motion.  Cardiovascular: Normal rate, regular rhythm and normal heart sounds.   Respiratory: Breath sounds normal.  GI: Soft. Bowel sounds are normal.  Musculoskeletal: Normal range of motion.  Neurological: She is alert and oriented to person, place, and time.  Skin: Skin is warm and dry.   BP 118/68 mmHg  Pulse 65  Resp 22  Ht 1.6 m ('5\' 3"'$ )  Wt 127 lb (57.607 kg)  BMI 22.50 kg/m2  SpO2 95%  Current Medications:   Current Outpatient Prescriptions  Medication Sig Dispense Refill  . alum & mag hydroxide-simeth (MAALOX/MYLANTA) 200-200-20 MG/5ML suspension Take 30 mLs by mouth as needed for indigestion or heartburn. 355 mL 0  . aspirin 81 MG tablet Take 81 mg by mouth daily.    Hendricks Limes CONTINUING MONTH PAK 1 MG tablet Take 1 tablet (1 mg total) by mouth 2 (two) times daily. 60 tablet 2  . donepezil (ARICEPT) 5 MG tablet Take 5 mg by mouth every evening.  0  . ezetimibe (ZETIA) 10 MG tablet  Take 10 mg by mouth at bedtime.     . mirtazapine (REMERON) 15 MG tablet Take 7.5 mg by mouth at bedtime.   0  . niacin (NIASPAN) 500 MG CR tablet Take 500 mg by mouth at bedtime.     . pantoprazole (PROTONIX) 40 MG tablet Take 40 mg by mouth 2 (two) times daily.    Marland Kitchen SPIRIVA RESPIMAT 2.5 MCG/ACT AERS INHALE 2 PUFFS INTO THE LUNGS DAILY. 4 g 3  . valACYclovir (VALTREX) 500 MG tablet Take 500 mg by mouth daily.    Penne Lash HFA 45 MCG/ACT inhaler Inhale 2 puffs into the lungs every 4 (four) hours as needed for wheezing or shortness of breath.   1  . oxyCODONE (OXY IR/ROXICODONE) 5 MG immediate release tablet Take 1 tablet (5 mg total) by mouth every 6 (six) hours as needed for severe pain. 40 tablet 0   No current facility-administered medications for this visit.    Functional Status:   In your present state of health, do you have any difficulty performing the following activities: 08/04/2015 07/04/2015  Hearing? N N  Vision? Y N  Difficulty concentrating or making decisions? N N  Walking or climbing stairs? Y N  Dressing or bathing? N N  Doing errands, shopping? Tempie Donning  Preparing Food and eating ? N -  Using the Toilet? N -  In the past six months, have you accidently leaked urine? N -  Do you have  problems with loss of bowel control? N -  Managing your Medications? N -  Managing your Finances? N -  Housekeeping or managing your Housekeeping? Y -    Fall/Depression Screening:    PHQ 2/9 Scores 08/04/2015  PHQ - 2 Score 0   Fall Risk  08/04/2015  Falls in the past year? No    Assessment:    Member currently at home with her daughter, who has been assisting her for the past several days.  Member states that she has had family in town, in addition to her niece and nephew, that has also been available to help when needed.  She lives with the niece and nephew and they are the ones that routinely offer the most assistance.  She reports taking all medications as prescribed, denies  problems with management or affording them.  She does have some shortness of breath with activity and talking for long periods of time, but recovers well with rest.  She reports that she has been increasing her tolerance level for exercise daily.  She has an appointment with the surgeon on tomorrow to discuss next steps.  She denies any signs or symptoms of infection.    Member denies any concerns at this time.  Discussed the plan of care, which is to complete the transition of care program currently, and possibly continue with [facility] care management service (either community or telephonic) depending on the plan of care provided by the surgeon.  She states that her only need currently is to learn as much as she can about the surgery she has had and how to recover.  Encouraged to contact this care manager with any questions.  Plan:   Will continue with weekly transition of care calls next week. Will mail EMMI education on surgical procedure (video assisted throacoscopy) and recovery.  THN CM Care Plan Problem One        Most Recent Value   Care Plan Problem One  Recent hospitalization   Role Documenting the Problem One  Care Management [place] for Problem One  Active   THN Long Term Goal (31-90 days)  Member will not be readmitted to hospital within the next 31 days   THN Long Term Goal Start Date  07/23/15   Interventions for Problem One Long Term Goal  Discussed with member the importance of following discharge instructions, including follow up appointments, medications, diet, and home health involvement, to decrease the risk of readmission   THN CM Short Term Goal #1 (0-30 days)  Member will have follow up appointment with primary care provider within the next 4 weeks   THN CM Short Term Goal #1 Start Date  07/23/15   Interventions for Short Term Goal #1  Discussed with member the importance of following discharge instructions, including follow up appointments, medications, diet,  and home health involvement, to decrease the risk of readmission   THN CM Short Term Goal #2 (0-30 days)  Member will take medications as prescribed over the next 4 weeks   THN CM Short Term Goal #2 Start Date  07/23/15   Interventions for Short Term Goal #2  Discussed with member the importance of following discharge instructions, including follow up appointments, medications, diet, and home health involvement, to decrease the risk of readmission     [name], BSN, [place] [facility] Manager [phone]

## 2015-08-11 ENCOUNTER — Other Ambulatory Visit: Payer: Self-pay | Admitting: *Deleted

## 2015-08-11 ENCOUNTER — Encounter: Payer: Self-pay | Admitting: *Deleted

## 2015-08-11 NOTE — Patient Outreach (Signed)
Weekly transition of care call placed to member.  She reports that she is doing better, but she is still experiencing some shortness of breath and pain at times.  Discussed with member that the healing process takes time and this is to be expected.  She does state that she continues to improve every day.  She reports that she continues to have some constipation and is using stool softeners.  She states that her pain is much improved, encouraged member to attempt to use Tylenol or Ibuprofen in the place of narcotics for pain to decrease symptoms of constipation.  She verbalizes understanding.  She states that she is scheduled for a follow up surgery to remove "whatever that is that's still left in there" in January.  She reports that this was discussed with her daughter and the physician, although she can't remember all that was discussed at this time.  According to the notes, she does not need any chemotherapy at this time.  She denies any questions.  Encouraged to contact this care manager with any concerns.  Will continue with weekly transition of care calls next week.  [name], BSN, [place] Management  [facility] Care Manager [phone]

## 2015-08-15 ENCOUNTER — Encounter: Payer: Self-pay | Admitting: Internal Medicine

## 2015-08-15 ENCOUNTER — Ambulatory Visit (INDEPENDENT_AMBULATORY_CARE_PROVIDER_SITE_OTHER): Payer: Commercial Managed Care - HMO | Admitting: Internal Medicine

## 2015-08-15 VITALS — BP 108/62 | HR 91 | Temp 98.0°F | Resp 12 | Wt 129.0 lb

## 2015-08-15 DIAGNOSIS — E042 Nontoxic multinodular goiter: Secondary | ICD-10-CM | POA: Diagnosis not present

## 2015-08-15 NOTE — Progress Notes (Addendum)
Patient ID: [name], female   DOB: 1940-11-22, 74 y.o.   MRN: [id number]   HPI  [name] is a 74 y.o.-year-old female, initially referred by Dr. Elsworth Soho, for evaluation for MNG. Last visit 1 year ago.  Pt was recently dx'ed with lung cancer - stage 1B, now s/p VATS. She has SOB. She will see an oncologist soon to see if needs ChTx or RxTx.   Reviewed hx: She was admitted in 06/2014 >> a CT chest was checked >> thyroid nodules >> sent for a thyroid U/S.  Thyroid U/S (07/22/2014): 4 nodules in the R lobe and 3 in the L lobe  - largest in R lobe: 3.2 x 1.8 x 2.1 cm  Pt is feeling "lumps" in her neck, she has chronic hoarseness (smoker), no dysphagia/odynophagia, SOB with lying down.  We Bx'ed her dominant thyroid nodule in 08/2015 >> benign.   I reviewed pt's thyroid tests: Lab Results  Component Value Date   TSH 1.840 05/25/2014   FREET4 1.04 05/25/2014    Pt c/o: - + hot flushes - Occasional tremors - no palpitations - no anxiety/depression - no hyperdefecation/+ occasionalconstipation - No weight loss and gain - no fatigue  Pt does have a FH of thyroid ds: daughter with hypothyroidism. No FH of thyroid cancer. No h/o radiation tx to head or neck.  No seaweed or kelp, no recent contrast studies. No herbal supplements.   I reviewed her chart and she also has a history of HL, GERD, asthma.  ROS: Constitutional: + see HPi Eyes: no blurry vision, no xerophthalmia ENT: no sore throat, + see HPI Cardiovascular: no CP/+ SOB/no palpitations/leg swelling Respiratory: + cough/+ SOB/+ wheezing Gastrointestinal: no N/V/D/+ C/+ occasional acid reflux Musculoskeletal: + both: muscle/joint aches Skin: no rashes Neurological: + tremors/no numbness/tingling/dizziness  I reviewed pt's medications, allergies, PMH, social hx, family hx, and changes were documented in the history of present illness. Otherwise, unchanged from my initial visit note. Started Aricept since last  visit.  Past Medical History  Diagnosis Date  . Hyperlipidemia   . Asthma   . COPD (chronic obstructive pulmonary disease) ([place])   . Shortness of breath   . GERD (gastroesophageal reflux disease)   . Thyroid nodule   . Depression   . Arthritis    Past Surgical History  Procedure Laterality Date  . Cesarean section    . Cataract extraction      left eye  . Microlaryngoscopy N/A 12/13/2014    Procedure: MICROLARYNGOSCOPY;  Surgeon: Jerrell Belfast, MD;  Location: [facility];  Service: ENT;  Laterality: N/A;  . Mass excision N/A 12/13/2014    Procedure: EXCISION OF VOCAL CORD POLYPS;  Surgeon: Jerrell Belfast, MD;  Location: [facility];  Service: ENT;  Laterality: N/A;  . Biopsy thyroid    . Cataract extraction w/ intraocular lens implant      RIGHT  . Video assisted thoracoscopy (vats)/ lobectomy Left 07/04/2015    Procedure: VIDEO ASSISTED THORACOSCOPY (VATS) LEFT UPPER LOBECTOMY;  Surgeon: Melrose Nakayama, MD;  Location: [facility];  Service: Thoracic;  Laterality: Left;  . Video bronchoscopy with insertion of interbronchial valve (ibv) N/A 07/11/2015    Procedure: VIDEO BRONCHOSCOPY WITH INSERTION OF INTERBRONCHIAL VALVE (IBV);  Surgeon: Melrose Nakayama, MD;  Location: [facility];  Service: Thoracic;  Laterality: N/A;   History   Social History  . Marital Status: Widowed    Spouse Name: N/A    Number of Children: 5   Occupational History  .  retired    Social History Main Topics  . Smoking status: Current Every Day Smoker -- now 3-4 cigs a day 0.50 packs/day for 54 years    Types: Cigarettes  . Smokeless tobacco: Never Used  . Alcohol Use: No  . Drug Use: No   Current Outpatient Prescriptions on File Prior to Visit  Medication Sig Dispense Refill  . alum & mag hydroxide-simeth (MAALOX/MYLANTA) 200-200-20 MG/5ML suspension Take 30 mLs by mouth as needed for indigestion or heartburn. 355 mL 0  . aspirin 81 MG tablet Take 81 mg by mouth daily.    Hendricks Limes CONTINUING MONTH PAK 1 MG  tablet Take 1 tablet (1 mg total) by mouth 2 (two) times daily. 60 tablet 2  . donepezil (ARICEPT) 5 MG tablet Take 5 mg by mouth every evening.  0  . ezetimibe (ZETIA) 10 MG tablet Take 10 mg by mouth at bedtime.     . mirtazapine (REMERON) 15 MG tablet Take 7.5 mg by mouth at bedtime.   0  . niacin (NIASPAN) 500 MG CR tablet Take 500 mg by mouth at bedtime.     Marland Kitchen oxyCODONE (OXY IR/ROXICODONE) 5 MG immediate release tablet Take 1 tablet (5 mg total) by mouth every 6 (six) hours as needed for severe pain. 40 tablet 0  . pantoprazole (PROTONIX) 40 MG tablet Take 40 mg by mouth 2 (two) times daily.    Marland Kitchen SPIRIVA RESPIMAT 2.5 MCG/ACT AERS INHALE 2 PUFFS INTO THE LUNGS DAILY. 4 g 3  . valACYclovir (VALTREX) 500 MG tablet Take 500 mg by mouth daily.    Penne Lash HFA 45 MCG/ACT inhaler Inhale 2 puffs into the lungs every 4 (four) hours as needed for wheezing or shortness of breath.   1   No current facility-administered medications on file prior to visit.   Allergies  Allergen Reactions  . Albuterol Shortness Of Breath   Family History  Problem Relation Age of Onset  . Renal cancer Brother   . Diabetes Mother   . Hypertension Mother   . Renal Disease Mother    PE: BP 108/62 mmHg  Pulse 91  Temp(Src) 98 F (36.7 C) (Oral)  Resp 12  Wt 129 lb (58.514 kg)  SpO2 94% Body mass index is 22.86 kg/(m^2). Wt Readings from Last 3 Encounters:  08/15/15 129 lb (58.514 kg)  08/05/15 127 lb (57.607 kg)  08/04/15 127 lb (57.607 kg)   Constitutional: normal weight, in NAD Eyes: PERRLA, EOMI, no exophthalmos ENT: moist mucous membranes, + thyromegaly - lumpy bumpy thyroid, no cervical lymphadenopathy Cardiovascular: RRR, No MRG Respiratory: CTA B Gastrointestinal: abdomen soft, NT, ND, BS+ Musculoskeletal: no deformities, strength intact in all 4;  Skin: moist, warm, no rashes Neurological: + tremor with outstretched hands (not new), DTR normal in all 4  ASSESSMENT: 1. MNG - thyroid U/S  (07/22/2014):  Right thyroid lobe  Measurements: 5.4 x 2.7 x 2.2 cm, previously measured 4.9 x 2.5 x 2.0 cm. Nodule along the anterior right thyroid lobe measures 1.1 x 0.4 x 0.9 cm and previously measured 1.0 x 0.5 x 0.9 cm. There is an isoechoic nodule in the anterior right thyroid lobe that measures 1.0 x 0.6 x 0.8 cm and unclear what this measured on the previous examination. Dominant nodule in the inferior right thyroid lobe measures 3.2 x 1.8 x 2.1 cm. This dominant nodule is heterogeneous and previously measured 2.4 x 2.1 x 2.0 cm. Nodule in the inferior right thyroid lobe measures 1.1 x 1.1  x 0.8 cm and previously measured 0.7 x 0.8 x 0.8 cm.  Left thyroid lobe  Measurements: 4.9 x 2.2 x 2.1 cm, previously measured 4.3 x 1.8 x 1.9 cm. Heterogeneous nodule in the mid left thyroid lobe measures 1.4 x 0.9 x 0.9 cm and previously measured 0.8 x 0.8 x 0.7 cm. There is a solid isoechoic nodule in the inferior left thyroid lobe that measures 1.3 x 0.9 x 0.8 cm and this was not clearly identified or measured on the previous examination. There is another small nodule in the left thyroid lobe that measures up to 0.6 cm.  Isthmus  Thickness: 0.3 cm. No nodules visualized.  Lymphadenopathy  None visualized.  IMPRESSION: Multi nodular goiter. Interval enlargement of the thyroid tissue and enlargement of the dominant thyroid nodules. Question new nodules.    08/22/2015: FNA: Adequacy Reason Satisfactory For Evaluation. Diagnosis THYROID, FINE NEEDLE ASPIRATION, RIGHT INFERIOR (SPECIMEN 1 OF 1 COLLECTED 08-20-2014 BENIGN. CONSISTENT WITH BENIGN FOLLICULAR NODULE (B[place] CATEGORY II). Mali RUND DO Pathologist, Electronic Signature (Case signed 08/21/2014) Specimen Clinical Information Dominant nodule in the inferior right thyroid lobe measures 3.2 x 1.8 x 2.1 cm Source Thyroid, Fine Needle Aspiration, Right Inferior, (Specimen 1 of 1, collected on 08/20/14  )  PLAN: 1. MNG  - I reviewed the report of her of her thyroid ultrasound from 07/2014. The R dominant nodule was Bx'ed 1 year ago >> benign. The rest of the nodules are <1.5 cm, without calcifications, without internal blood flow, more wide than tall. Pt does not have a thyroid cancer family history or a personal history of RxTx to head/neck. All these would favor benignity.  - We decided to obtain another thyroid ultrasound today to follow mostly the rest of the thyroid nodules. If they have not grown or changed ultrasound characteristics, I advised the patient to return in 2 years for reevaluation. We may obtain a thyroid ultrasound at that time or continue to follow her clinically only - she should let me know if she develops neck compression symptoms, in that case, we might need to do either lobectomy or thyroidectomy Return in about 2 years (around 08/14/2017).  CLINICAL DATA: Multi nodular goiter  EXAM: THYROID ULTRASOUND  TECHNIQUE: Ultrasound examination of the thyroid gland and adjacent soft tissues was performed.  COMPARISON: 07/22/2014  FINDINGS: * Right thyroid lobe 5.3 x 2.4 x 3.0 cm.   Dominant right lower pole predominately solid nodule measures 3.1 x 1.8 x 2.0 cm. Previously this measured 3.2 x 1.8 x 2.1 cm.   11 mm upper pole nodule. Color Doppler imaging demonstrates normal vascularity in the gland. * Left thyroid lobe 4.5 x 2.0 x 2.2 cm.   Left lower pole nodule measures 14 x 8 x 11 mm and previously measured 9 x 14 x 9 mm.   Lower pole nodule measures 13 x 9 x 10 mm and previously measured 13 x 9 x 8 mm. Color Doppler imaging demonstrates normal vascularity in the gland. * Isthmus Thickness: 4 mm. No nodules visualized. * Lymphadenopathy - None visualized.  IMPRESSION: Bilateral nodules are stable. Dominant nodule measures 3.1 cm in the right lower pole.   Electronically Signed By: Marybelle Killings M.D. On: 08/21/2015 16:59

## 2015-08-15 NOTE — Patient Instructions (Signed)
You will be called with the new thyroid U/S schedule.  Please return in 2 years.

## 2015-08-17 ENCOUNTER — Other Ambulatory Visit: Payer: Self-pay | Admitting: Pulmonary Disease

## 2015-08-18 ENCOUNTER — Telehealth: Payer: Self-pay | Admitting: Pulmonary Disease

## 2015-08-18 MED ORDER — TIOTROPIUM BROMIDE MONOHYDRATE 2.5 MCG/ACT IN AERS: INHALATION_SPRAY | RESPIRATORY_TRACT | Status: DC

## 2015-08-18 MED ORDER — LEVALBUTEROL TARTRATE 45 MCG/ACT IN AERO: 2.0000 | INHALATION_SPRAY | RESPIRATORY_TRACT | Status: DC | PRN

## 2015-08-18 NOTE — Telephone Encounter (Signed)
Spoke with pt, needs refill on spiriva respimat and xopenex, but was told by pharmacy that insurance will not cover xopenex rescue inhaler.  Pt was not given alternative med by pharmacy. Called pharmacy, she just needed a new rx as she had run out of refills.   [facility] [address]    Nothing further needed.

## 2015-08-19 ENCOUNTER — Telehealth: Payer: Self-pay | Admitting: Pulmonary Disease

## 2015-08-19 NOTE — Telephone Encounter (Signed)
Called and spoke with pt Pt stated that refill of her xopenex inhaler was over $90 and pt can not afford this Pt is requesting an alternative medication be called into her pharmacy   Dr Elsworth Soho, please advise

## 2015-08-21 ENCOUNTER — Ambulatory Visit
Admission: RE | Admit: 2015-08-21 | Discharge: 2015-08-21 | Disposition: A | Discharge status: Home / Self Care | Payer: Commercial Managed Care - HMO | Source: Ambulatory Visit | Attending: Internal Medicine | Admitting: Internal Medicine

## 2015-08-21 DIAGNOSIS — E042 Nontoxic multinodular goiter: Secondary | ICD-10-CM | POA: Diagnosis not present

## 2015-08-22 ENCOUNTER — Other Ambulatory Visit: Payer: Self-pay | Admitting: *Deleted

## 2015-08-22 NOTE — Patient Outreach (Signed)
Weekly transition of care call placed to member.  She states that she is "coming along."  She reports that she is not having as much pain as she was previously, but continues to take oxycodone (along with stool softeners) as needed.  She denies any concerns at this time, and is looking forward to completing her surgery in a couple weeks.  Member agrees to no home visit this month, this care manager will follow up with her prior to her surgery, and will a new transition of care program once discharged.  Encouraged to contact this care manager with any questions.  [name], BSN, [place] Management  [facility] Care Manager [phone]

## 2015-08-26 DIAGNOSIS — R490 Dysphonia: Secondary | ICD-10-CM | POA: Diagnosis not present

## 2015-08-26 DIAGNOSIS — J381 Polyp of vocal cord and larynx: Secondary | ICD-10-CM | POA: Diagnosis not present

## 2015-08-26 DIAGNOSIS — J3489 Other specified disorders of nose and nasal sinuses: Secondary | ICD-10-CM | POA: Diagnosis not present

## 2015-08-28 MED ORDER — ALBUTEROL SULFATE HFA 108 (90 BASE) MCG/ACT IN AERS: 2.0000 | INHALATION_SPRAY | Freq: Four times a day (QID) | RESPIRATORY_TRACT | Status: DC | PRN

## 2015-08-28 NOTE — Telephone Encounter (Signed)
Sending to DOD as RA is unavailable for an indeterminate amount of time.   CY please advise on an alternative for xopenex inhaler.  Thanks!

## 2015-08-28 NOTE — Telephone Encounter (Signed)
Offer albuterol HFA, # 1, 2 puffs every 6 hours, refill x 11

## 2015-08-28 NOTE — Telephone Encounter (Signed)
Pt is aware of CY's recommendation. Rx has been sent in. Nothing further was needed.

## 2015-09-03 ENCOUNTER — Encounter (HOSPITAL_COMMUNITY)
Admission: RE | Admit: 2015-09-03 | Discharge: 2015-09-03 | Disposition: A | Discharge status: Home / Self Care | Payer: Commercial Managed Care - HMO | Source: Ambulatory Visit | Attending: Thoracic Surgery (Cardiothoracic Vascular Surgery) | Admitting: Thoracic Surgery (Cardiothoracic Vascular Surgery)

## 2015-09-03 ENCOUNTER — Encounter (HOSPITAL_COMMUNITY): Payer: Self-pay

## 2015-09-03 VITALS — BP 145/87 | HR 81 | Temp 98.2°F | Resp 18 | Ht 63.0 in | Wt 129.2 lb

## 2015-09-03 DIAGNOSIS — Z01812 Encounter for preprocedural laboratory examination: Secondary | ICD-10-CM | POA: Diagnosis not present

## 2015-09-03 DIAGNOSIS — C801 Malignant (primary) neoplasm, unspecified: Secondary | ICD-10-CM | POA: Insufficient documentation

## 2015-09-03 LAB — COMPREHENSIVE METABOLIC PANEL
ALT: 17 U/L (ref 14–54)
AST: 25 U/L (ref 15–41)
Albumin: 3.9 g/dL (ref 3.5–5.0)
Alkaline Phosphatase: 79 U/L (ref 38–126)
Anion gap: 7 (ref 5–15)
BUN: 8 mg/dL (ref 6–20)
CHLORIDE: 107 mmol/L (ref 101–111)
CO2: 29 mmol/L (ref 22–32)
CREATININE: 0.95 mg/dL (ref 0.44–1.00)
Calcium: 9.5 mg/dL (ref 8.9–10.3)
GFR, EST NON AFRICAN AMERICAN: 58 mL/min — AB (ref >60)
GLUCOSE: 103 mg/dL — AB (ref 65–99)
POTASSIUM: 3.7 mmol/L (ref 3.5–5.1)
Sodium: 143 mmol/L (ref 135–145)
TOTAL PROTEIN: 7.2 g/dL (ref 6.5–8.1)
Total Bilirubin: 1.2 mg/dL (ref 0.3–1.2)

## 2015-09-03 LAB — CBC
HCT: 39.9 % (ref 36.0–46.0)
Hemoglobin: 13.1 g/dL (ref 12.0–15.0)
MCH: 32.1 pg (ref 26.0–34.0)
MCHC: 32.8 g/dL (ref 30.0–36.0)
MCV: 97.8 fL (ref 78.0–100.0)
Platelets: 210 10*3/uL (ref 150–400)
RBC: 4.08 MIL/uL (ref 3.87–5.11)
RDW: 14.6 % (ref 11.5–15.5)
WBC: 5.9 10*3/uL (ref 4.0–10.5)

## 2015-09-03 LAB — PROTIME-INR
INR: 1.02 (ref 0.00–1.49)
PROTHROMBIN TIME: 13.6 s (ref 11.6–15.2)

## 2015-09-03 LAB — APTT: APTT: 29 s (ref 24–37)

## 2015-09-03 NOTE — Progress Notes (Signed)
Denies any cardiac issues.   Denies any heart studies.

## 2015-09-03 NOTE — Pre-Procedure Instructions (Signed)
[name]  09/03/2015     Your procedure is scheduled on : September 10, 2015, Wednesday at 8:30 AM.   Report to [facility] Admitting at 6:30 AM.  Call this number if you have problems the morning of surgery: [phone]    Remember:  Do not eat food or drink liquids after midnight  Tuesday.  Take these medicines the morning of surgery with A SIP OF WATER : Albuterol inhaler if needed, Oxycodone if needed, Pantoprazole (Protonix), Spiriva inhaler, Valacyclovir (Valtrex)   Stop taking any vitamins, herbal medications, Ibuprofen, Advil, Motrin, Aleve, etc as of today   Do not wear jewelry, make-up or nail polish.  Do not wear lotions, powders, or perfumes.    Do not shave 48 hours prior to surgery.    Do not bring valuables to the hospital.  [facility] is not responsible for any belongings or valuables.  Contacts, dentures or bridgework may not be worn into surgery.  Leave your suitcase in the car.  After surgery it may be brought to your room.  For patients admitted to the hospital, discharge time will be determined by your treatment team.  Patients discharged the day of surgery will not be allowed to drive home.   Name and phone number of your driver:   [name]  -- DAUGHTER  Special instructions:  Shower using CHG soap the night before and the morning of your surgery  Please read over the following fact sheets that you were given. Pain Booklet, Coughing and Deep Breathing and Surgical Site Infection Prevention

## 2015-09-09 ENCOUNTER — Other Ambulatory Visit: Payer: Self-pay | Admitting: *Deleted

## 2015-09-09 DIAGNOSIS — C3490 Malignant neoplasm of unspecified part of unspecified bronchus or lung: Secondary | ICD-10-CM

## 2015-09-09 MED ORDER — OXYCODONE HCL 5 MG PO TABS: 5.0000 mg | ORAL_TABLET | Freq: Four times a day (QID) | ORAL | Status: DC | PRN

## 2015-09-09 NOTE — Telephone Encounter (Signed)
[name] has called for a refill of her Oxycodone s/p lung surgery 07/04/15.  She is scheduled for the removal of bronchial valves tomorrow. I informed her that a new script would be available at our office today and she understood.

## 2015-09-10 ENCOUNTER — Ambulatory Visit (HOSPITAL_COMMUNITY): Payer: Commercial Managed Care - HMO

## 2015-09-10 ENCOUNTER — Encounter (HOSPITAL_COMMUNITY)
Admission: RE | Disposition: A | Discharge status: Home / Self Care | Payer: Self-pay | Source: Ambulatory Visit | Attending: Thoracic Surgery (Cardiothoracic Vascular Surgery)

## 2015-09-10 ENCOUNTER — Encounter (HOSPITAL_COMMUNITY): Payer: Self-pay | Admitting: *Deleted

## 2015-09-10 ENCOUNTER — Ambulatory Visit (HOSPITAL_COMMUNITY): Payer: Commercial Managed Care - HMO | Admitting: Certified Registered"

## 2015-09-10 ENCOUNTER — Ambulatory Visit (HOSPITAL_COMMUNITY)
Admission: RE | Admit: 2015-09-10 | Discharge: 2015-09-10 | Disposition: A | Discharge status: Home / Self Care | Payer: Commercial Managed Care - HMO | Source: Ambulatory Visit | Attending: Thoracic Surgery (Cardiothoracic Vascular Surgery) | Admitting: Thoracic Surgery (Cardiothoracic Vascular Surgery)

## 2015-09-10 DIAGNOSIS — K5900 Constipation, unspecified: Secondary | ICD-10-CM | POA: Insufficient documentation

## 2015-09-10 DIAGNOSIS — J45909 Unspecified asthma, uncomplicated: Secondary | ICD-10-CM | POA: Insufficient documentation

## 2015-09-10 DIAGNOSIS — C801 Malignant (primary) neoplasm, unspecified: Secondary | ICD-10-CM

## 2015-09-10 DIAGNOSIS — J449 Chronic obstructive pulmonary disease, unspecified: Secondary | ICD-10-CM | POA: Diagnosis not present

## 2015-09-10 DIAGNOSIS — Z9889 Other specified postprocedural states: Secondary | ICD-10-CM

## 2015-09-10 DIAGNOSIS — Z4589 Encounter for adjustment and management of other implanted devices: Secondary | ICD-10-CM | POA: Insufficient documentation

## 2015-09-10 DIAGNOSIS — K219 Gastro-esophageal reflux disease without esophagitis: Secondary | ICD-10-CM | POA: Diagnosis not present

## 2015-09-10 DIAGNOSIS — E785 Hyperlipidemia, unspecified: Secondary | ICD-10-CM | POA: Insufficient documentation

## 2015-09-10 DIAGNOSIS — R0602 Shortness of breath: Secondary | ICD-10-CM | POA: Diagnosis not present

## 2015-09-10 DIAGNOSIS — Z01818 Encounter for other preprocedural examination: Secondary | ICD-10-CM | POA: Diagnosis not present

## 2015-09-10 DIAGNOSIS — Z7982 Long term (current) use of aspirin: Secondary | ICD-10-CM | POA: Diagnosis not present

## 2015-09-10 DIAGNOSIS — Z4689 Encounter for fitting and adjustment of other specified devices: Secondary | ICD-10-CM | POA: Diagnosis not present

## 2015-09-10 DIAGNOSIS — C3492 Malignant neoplasm of unspecified part of left bronchus or lung: Secondary | ICD-10-CM | POA: Diagnosis not present

## 2015-09-10 DIAGNOSIS — C3412 Malignant neoplasm of upper lobe, left bronchus or lung: Secondary | ICD-10-CM | POA: Diagnosis not present

## 2015-09-10 HISTORY — PX: VIDEO BRONCHOSCOPY WITH INSERTION OF INTERBRONCHIAL VALVE (IBV): SHX6178

## 2015-09-10 SURGERY — BRONCHOSCOPY, FLEXIBLE, WITH INTRABRONCHIAL VALVE INSERTION
Anesthesia: General

## 2015-09-10 MED ORDER — LIDOCAINE HCL (CARDIAC) 20 MG/ML IV SOLN
INTRAVENOUS | Status: AC
  Filled 2015-09-10: qty 5

## 2015-09-10 MED ORDER — SUGAMMADEX SODIUM 200 MG/2ML IV SOLN
INTRAVENOUS | Status: DC | PRN
  Administered 2015-09-10: 100 mg via INTRAVENOUS

## 2015-09-10 MED ORDER — SUFENTANIL CITRATE 50 MCG/ML IV SOLN
INTRAVENOUS | Status: AC
  Filled 2015-09-10: qty 1

## 2015-09-10 MED ORDER — FENTANYL CITRATE (PF) 100 MCG/2ML IJ SOLN: 25.0000 ug | INTRAMUSCULAR | Status: DC | PRN

## 2015-09-10 MED ORDER — PROPOFOL 10 MG/ML IV BOLUS
INTRAVENOUS | Status: DC | PRN
Start: 2015-09-10 — End: 2015-09-10
  Administered 2015-09-10: 110 mg via INTRAVENOUS

## 2015-09-10 MED ORDER — OXYCODONE HCL 5 MG PO TABS: 5.0000 mg | ORAL_TABLET | Freq: Once | ORAL | Status: DC | PRN

## 2015-09-10 MED ORDER — ONDANSETRON HCL 4 MG/2ML IJ SOLN: 4.0000 mg | Freq: Once | INTRAMUSCULAR | Status: DC | PRN

## 2015-09-10 MED ORDER — EPINEPHRINE HCL 1 MG/ML IJ SOLN
INTRAMUSCULAR | Status: DC | PRN
  Administered 2015-09-10: 1 mg

## 2015-09-10 MED ORDER — SODIUM CHLORIDE 0.9 % IJ SOLN
INTRAMUSCULAR | Status: AC
  Filled 2015-09-10: qty 10

## 2015-09-10 MED ORDER — SUFENTANIL CITRATE 50 MCG/ML IV SOLN
INTRAVENOUS | Status: DC | PRN
  Administered 2015-09-10: 15 ug via INTRAVENOUS
  Administered 2015-09-10: 5 ug via INTRAVENOUS

## 2015-09-10 MED ORDER — EPINEPHRINE HCL 1 MG/ML IJ SOLN
INTRAMUSCULAR | Status: AC
  Filled 2015-09-10: qty 1

## 2015-09-10 MED ORDER — OXYCODONE HCL 5 MG/5ML PO SOLN: 5.0000 mg | Freq: Once | ORAL | Status: DC | PRN

## 2015-09-10 MED ORDER — SUCCINYLCHOLINE CHLORIDE 20 MG/ML IJ SOLN
INTRAMUSCULAR | Status: AC
  Filled 2015-09-10: qty 1

## 2015-09-10 MED ORDER — LIDOCAINE HCL (CARDIAC) 20 MG/ML IV SOLN
INTRAVENOUS | Status: DC | PRN
  Administered 2015-09-10: 50 mg via INTRAVENOUS

## 2015-09-10 MED ORDER — ROCURONIUM BROMIDE 50 MG/5ML IV SOLN
INTRAVENOUS | Status: AC
  Filled 2015-09-10: qty 1

## 2015-09-10 MED ORDER — MIDAZOLAM HCL 2 MG/2ML IJ SOLN
INTRAMUSCULAR | Status: AC
  Filled 2015-09-10: qty 2

## 2015-09-10 MED ORDER — LACTATED RINGERS IV SOLN
INTRAVENOUS | Status: DC | PRN
  Administered 2015-09-10: 08:00:00 via INTRAVENOUS

## 2015-09-10 MED ORDER — ROCURONIUM BROMIDE 100 MG/10ML IV SOLN
INTRAVENOUS | Status: DC | PRN
  Administered 2015-09-10: 30 mg via INTRAVENOUS

## 2015-09-10 MED ORDER — 0.9 % SODIUM CHLORIDE (POUR BTL) OPTIME
TOPICAL | Status: DC | PRN
  Administered 2015-09-10: 1000 mL

## 2015-09-10 MED ORDER — ONDANSETRON HCL 4 MG/2ML IJ SOLN
INTRAMUSCULAR | Status: AC
  Filled 2015-09-10: qty 2

## 2015-09-10 MED ORDER — ONDANSETRON HCL 4 MG/2ML IJ SOLN
INTRAMUSCULAR | Status: DC | PRN
  Administered 2015-09-10: 4 mg via INTRAVENOUS

## 2015-09-10 MED ORDER — PROPOFOL 10 MG/ML IV BOLUS
INTRAVENOUS | Status: AC
  Filled 2015-09-10: qty 20

## 2015-09-10 MED ORDER — SUGAMMADEX SODIUM 200 MG/2ML IV SOLN
INTRAVENOUS | Status: AC
  Filled 2015-09-10: qty 2

## 2015-09-10 SURGICAL SUPPLY — 25 items
CANISTER SUCTION 2500CC (MISCELLANEOUS) ×3 IMPLANT
CONT SPEC 4OZ CLIKSEAL STRL BL (MISCELLANEOUS) ×1 IMPLANT
COVER TABLE BACK 60X90 (DRAPES) ×3 IMPLANT
FILTER STRAW FLUID ASPIR (MISCELLANEOUS) ×3 IMPLANT
FORCEPS BIOP RJ4 1.8 (CUTTING FORCEPS) ×3 IMPLANT
FORCEPS BIOP SPYBITE 1.2X286 (FORCEP) ×3 IMPLANT
FORCEPS BIOP SUPERTRX PREMAR (INSTRUMENTS) ×3 IMPLANT
GAUZE SPONGE 4X4 12PLY STRL (GAUZE/BANDAGES/DRESSINGS) ×3 IMPLANT
GLOVE SURG SIGNA 7.5 PF LTX (GLOVE) ×3 IMPLANT
GOWN STRL REUS W/ TWL XL LVL3 (GOWN DISPOSABLE) ×1 IMPLANT
GOWN STRL REUS W/TWL XL LVL3 (GOWN DISPOSABLE) ×6
KIT CLEAN ENDO COMPLIANCE (KITS) ×3 IMPLANT
KIT ROOM TURNOVER OR (KITS) ×3 IMPLANT
MARKER SKIN DUAL TIP RULER LAB (MISCELLANEOUS) ×3 IMPLANT
NS IRRIG 1000ML POUR BTL (IV SOLUTION) ×3 IMPLANT
OIL SILICONE PENTAX (PARTS (SERVICE/REPAIRS)) ×2 IMPLANT
PAD ARMBOARD 7.5X6 YLW CONV (MISCELLANEOUS) ×6 IMPLANT
STOPCOCK MORSE 400PSI 3WAY (MISCELLANEOUS) ×1 IMPLANT
SYR 20ML ECCENTRIC (SYRINGE) ×6 IMPLANT
SYR 3ML LL SCALE MARK (SYRINGE) ×3 IMPLANT
SYRINGE 10CC LL (SYRINGE) ×3 IMPLANT
TOWEL NATURAL 4PK STERILE (DISPOSABLE) ×3 IMPLANT
TRAP SPECIMEN MUCOUS 40CC (MISCELLANEOUS) IMPLANT
TUBE CONNECTING 20'X1/4 (TUBING) ×1
TUBE CONNECTING 20X1/4 (TUBING) ×2 IMPLANT

## 2015-09-10 NOTE — Transfer of Care (Signed)
Immediate Anesthesia Transfer of Care Note  Patient: [name]  Procedure(s) Performed: Procedure(s): VIDEO BRONCHOSCOPY WITH REMOVAL OF INTERBRONCHIAL VALVES (N/A)  Patient Location: PACU  Anesthesia Type:General  Level of Consciousness: awake, oriented and patient cooperative  Airway & Oxygen Therapy: Patient Spontanous Breathing and Patient connected to nasal cannula oxygen  Post-op Assessment: Report given to RN, Post -op Vital signs reviewed and stable and Patient moving all extremities  Post vital signs: Reviewed and stable  Last Vitals:  Filed Vitals:   09/10/15 0645 09/10/15 0942  BP: 149/79   Pulse:  76  Temp:  36.6 C  Resp:  16    Complications: No apparent anesthesia complications

## 2015-09-10 NOTE — Anesthesia Preprocedure Evaluation (Signed)
Anesthesia Evaluation  Patient identified by MRN, date of birth, ID band Patient awake    Reviewed: Allergy & Precautions, NPO status , Patient's Chart, lab work & pertinent test results  Airway Mallampati: II  TM Distance: >3 FB Neck ROM: Full    Dental  (+) Edentulous Upper, Edentulous Lower   Pulmonary former smoker,    breath sounds clear to auscultation       Cardiovascular  Rhythm:Regular Rate:Normal     Neuro/Psych    GI/Hepatic   Endo/Other    Renal/GU      Musculoskeletal   Abdominal   Peds  Hematology   Anesthesia Other Findings   Reproductive/Obstetrics                             Anesthesia Physical Anesthesia Plan  ASA: III  Anesthesia Plan: General   Post-op Pain Management:    Induction: Intravenous  Airway Management Planned: Oral ETT  Additional Equipment:   Intra-op Plan:   Post-operative Plan: Extubation in OR  Informed Consent: I have reviewed the patients History and Physical, chart, labs and discussed the procedure including the risks, benefits and alternatives for the proposed anesthesia with the patient or authorized representative who has indicated his/her understanding and acceptance.     Plan Discussed with: CRNA and Anesthesiologist  Anesthesia Plan Comments:         Anesthesia Quick Evaluation

## 2015-09-10 NOTE — Brief Op Note (Addendum)
09/10/2015  9:40 AM  PATIENT:  [name]  75 y.o. female  PRE-OPERATIVE DIAGNOSIS:  Endobronchial Valves  POST-OPERATIVE DIAGNOSIS:  Endobronchial Valves  PROCEDURE:  Procedure(s): VIDEO BRONCHOSCOPY WITH REMOVAL OF INTERBRONCHIAL VALVES (N/A)  SURGEON:  Surgeon(s) and Role:    * Melrose Nakayama, MD - Primary    ANESTHESIA:   general  EBL:  Total I/O In: 700 [I.V.:700] Out: -   BLOOD ADMINISTERED:none  DRAINS: none   LOCAL MEDICATIONS USED:  NONE  SPECIMEN:  No Specimen  DISPOSITION OF SPECIMEN:  N/A  PLAN OF CARE: Discharge to home after PACU  PATIENT DISPOSITION:  PACU - hemodynamically stable.   Delay start of Pharmacological VTE agent (>24hrs) due to surgical blood loss or risk of bleeding: not applicable  FINDINGS: LUL stump well healed. All 4 valves removed without complication  Dictation # [id number]

## 2015-09-10 NOTE — Anesthesia Procedure Notes (Signed)
Procedure Name: Intubation Date/Time: 09/10/2015 8:29 AM Performed by: Melina Copa, Lanisha Stepanian R Pre-anesthesia Checklist: Patient identified, Emergency Drugs available, Suction available, Patient being monitored and Timeout performed Patient Re-evaluated:Patient Re-evaluated prior to inductionOxygen Delivery Method: Circle system utilized Preoxygenation: Pre-oxygenation with 100% oxygen Intubation Type: IV induction Ventilation: Mask ventilation without difficulty Laryngoscope Size: Mac and 3 Grade View: Grade II Tube type: Oral Tube size: 9.0 mm Number of attempts: 1 Airway Equipment and Method: Stylet Placement Confirmation: ETT inserted through vocal cords under direct vision and positive ETCO2 Secured at: 21 cm Tube secured with: Tape Dental Injury: Teeth and Oropharynx as per pre-operative assessment

## 2015-09-10 NOTE — H&P (Signed)
[address]  [place], [address]  [phone]    HPI: Mrs. Frankland returns for a scheduled postoperative follow-up visit.  She is a 75 year old woman who had a thoracoscopic left upper lobectomy on 07/04/2015 for a stage IB (T2a, N0) adenocarcinoma. Her postoperative course was complicated by prolonged air leak. She initially did not have an air leak and her chest tubes were removed on day 3. However she had to have a chest tube replaced the following day after she developed severe saphenous emphysema. She had a large air leak that persisted. Eventually we did endobronchial valves on 07/11/2015. After that her air leak resolved and her chest tube was removed. She was finally discharged home on 07/22/2015.  Since discharge she is noted that her breathing is not back to her preoperative baseline. That she has not had any episodes of wheezing or severe shortness of breath. She says she feels aware that part of her lung is missing. She does still have a lot of incisional pain and is taking oxycodone on a regular basis. Her primary complaints are worsening of her chronic reflux and constipation.  Past Medical History  Diagnosis Date  . Hyperlipidemia   . Asthma   . COPD (chronic obstructive pulmonary disease) ([place])   . Shortness of breath   . GERD (gastroesophageal reflux disease)   . Thyroid nodule   . Depression   . Arthritis       Current Outpatient Prescriptions  Medication Sig Dispense Refill  . alum & mag hydroxide-simeth (MAALOX/MYLANTA) 200-200-20 MG/5ML suspension Take 30 mLs by mouth as needed for indigestion or heartburn. 355 mL 0  . aspirin 81 MG tablet Take 81 mg by mouth daily.    Hendricks Limes CONTINUING MONTH PAK 1 MG tablet Take 1 tablet (1 mg total) by mouth 2 (two) times daily. 60 tablet 2  . donepezil (ARICEPT) 5 MG tablet Take 5 mg by mouth every  evening.  0  . ezetimibe (ZETIA) 10 MG tablet Take 10 mg by mouth at bedtime.     . mirtazapine (REMERON) 15 MG tablet Take 7.5 mg by mouth at bedtime.   0  . niacin (NIASPAN) 500 MG CR tablet Take 500 mg by mouth at bedtime.     Marland Kitchen oxyCODONE (OXY IR/ROXICODONE) 5 MG immediate release tablet Take 1 tablet (5 mg total) by mouth every 6 (six) hours as needed for severe pain. 40 tablet 0  . pantoprazole (PROTONIX) 40 MG tablet Take 40 mg by mouth 2 (two) times daily.    Marland Kitchen SPIRIVA RESPIMAT 2.5 MCG/ACT AERS INHALE 2 PUFFS INTO THE LUNGS DAILY. 4 g 3  . valACYclovir (VALTREX) 500 MG tablet Take 500 mg by mouth daily.    Penne Lash HFA 45 MCG/ACT inhaler Inhale 2 puffs into the lungs every 4 (four) hours as needed for wheezing or shortness of breath.   1   No current facility-administered medications for this visit.    Physical Exam BP 154/74 mmHg  Pulse 64  Resp 20  Ht '5\' 3"'$  (1.6 m)  Wt 127 lb (57.607 kg)  BMI 22.50 kg/m2  SpO16 9% 75 year old woman in no acute distress Alert and oriented 3 with no focal neurologic deficit Obvious discomfort with movement Incisions healing well Cardiac regular rate and rhythm normal S1 and S2 Lungs slightly diminished left base, otherwise clear  Diagnostic Tests: I personally reviewed her chest x-ray. It shows postoperative changes from a left upper lobectomy. Endobronchial valves are in  place.  Impression: 75 year old woman who underwent a thoracoscopic left upper lobectomy for a stage IB non-small cell carcinoma. Her surgery was about a month ago. She did have a prolonged air leak and had to have endobronchial valves placed. After that her air leak resolved. She does still have some incisional pain, which is not unusual. That should improve with time. I did give her another prescription for oxycodone 5 mg tablets, one to 2 tablets 4 times daily as needed for pain, dispense 30, no refills. If she needs  additional pain medication she can just call the office and we will take care of that for her.  She has chronic reflux. She says that has worsened since her surgery. That does not surprise me. I recommended that she go back to taking the Protonix twice daily. She continued to use Maalox as needed when necessary.  She complains of constipation. That likely is aggravated by the narcotics. She may use over-the-counter stool softeners as needed for that.  She has a stage IB non-small cell carcinoma. I'm going to refer her to our multispecialty thoracic oncology clinic see oncology regarding recommendations for that. Typically she would not need chemotherapy given the tumor size 1.6 cm.  We placed endobronchial valves on November 4. Those need to come out in the 6-8 weeks timeframe. She would like to wait until after the holidays. We will plan to do bronchoscopy and endobronchial valve removal on 09/10/2015. We discussed the indications, risks, benefits, and alternatives. She understands the risk include those associated with anesthesia, bleeding, and possible recurrent pneumothorax.  Plan: Referral to [facility] to see Dr. Julien Nordmann  Endo bronchial valve removal on 09/10/2015  Melrose Nakayama, MD [facility] [phone]     No interval change  Revonda Standard. Roxan Hockey, MD [facility] [phone]

## 2015-09-10 NOTE — Anesthesia Postprocedure Evaluation (Signed)
Anesthesia Post Note  Patient: [name]  Procedure(s) Performed: Procedure(s) (LRB): VIDEO BRONCHOSCOPY WITH REMOVAL OF INTERBRONCHIAL VALVES (N/A)  Patient location during evaluation: PACU Anesthesia Type: General Level of consciousness: awake and awake and alert Pain management: pain level controlled Vital Signs Assessment: post-procedure vital signs reviewed and stable Respiratory status: spontaneous breathing and nonlabored ventilation Anesthetic complications: no    Last Vitals:  Filed Vitals:   09/10/15 1115 09/10/15 1130  BP: 149/90 145/70  Pulse: 84 78  Temp: 37.1 C   Resp:      Last Pain:  Filed Vitals:   09/10/15 1156  PainSc: 0-No pain                 Ova Meegan COKER

## 2015-09-10 NOTE — Op Note (Signed)
NAMERIDHIMA, GOLBERG              ACCOUNT NO.:  [id number]  MEDICAL RECORD NO.:  [id number]  LOCATION:  [facility]                         FACILITY:  [facility]  PHYSICIAN:  Revonda Standard. Roxan Hockey, M.D.DATE OF BIRTH:  1940/12/26  DATE OF PROCEDURE:  09/10/2015 DATE OF DISCHARGE:  09/10/2015                              OPERATIVE REPORT   PREOPERATIVE DIAGNOSIS:  Endobronchial valves.  POSTOPERATIVE DIAGNOSIS:  Endobronchial valves.  PROCEDURE:  Bronchoscopy for removal of endobronchial valves.  SURGEON:  Revonda Standard. Roxan Hockey, M.D.  ASSISTANT:  None.  ANESTHESIA:  General.  FINDINGS:  Left upper lobe stump well healed.  Endobronchial valves removed.  CLINICAL NOTE:  [name] is a 75 year old woman who had a left upper lobectomy for a stage IB non-small cell carcinoma.  Postoperatively, she had a prolonged air leak and required endobronchial valve placement. After the valves were placed, her air leak resolved and she was discharged home.  She now returns for removal of the endobronchial valves.  The indications, risks, benefits, and alternatives were discussed in detail with the patient.  She understood and accepted the risks and agreed to proceed.  OPERATIVE NOTE:  [name] was brought to the operating room on September 10, 2015.  She had induction of general anesthesia.  Flexible fiberoptic bronchoscopy was performed via the endotracheal tube.  The right bronchial tree was normal to the level of the subsegmental bronchi.  The left upper lobe bronchial stump was well healed.  There were 4 endobronchial valves in various left lower lobe segmental bronchi. The stems of the valves were grasped with biopsy forceps and removed.  There was some minor bleeding from the airway with removal of the valves.  Saline and dilute epinephrine were applied to clear the blood.  After a final inspection with the bronchoscope to ensure that there was no ongoing bleeding, the bronchoscope was  removed.  The patient was extubated in the operating room and taken the postanesthetic care unit in good condition.     Revonda Standard Roxan Hockey, M.D.     SCH/MEDQ  D:  09/10/2015  T:  09/10/2015  Job:  [id number]

## 2015-09-10 NOTE — OR Nursing (Signed)
Post - bronchoscopy discharge instructions reviewed with patient and family. Pt to go by Dr. Leonarda Salon office to pick up prescription per Dr. Roxan Hockey and will make follow up appointment at that time. Patient and family verbalized understanding.

## 2015-09-10 NOTE — Interval H&P Note (Signed)
History and Physical Interval Note:  09/10/2015 7:57 AM  [name]  has presented today for surgery, with the diagnosis of PROLONGED AIRLEAK  The various methods of treatment have been discussed with the patient and family. After consideration of risks, benefits and other options for treatment, the patient has consented to  Procedure(s): VIDEO BRONCHOSCOPY WITH REMOVAL OF INTERBRONCHIAL VALVE (IBV) (N/A) as a surgical intervention .  The patient's history has been reviewed, patient examined, no change in status, stable for surgery.  I have reviewed the patient's chart and labs.  Questions were answered to the patient's satisfaction.     Melrose Nakayama

## 2015-09-11 ENCOUNTER — Encounter (HOSPITAL_COMMUNITY): Payer: Self-pay | Admitting: Thoracic Surgery (Cardiothoracic Vascular Surgery)

## 2015-09-12 ENCOUNTER — Other Ambulatory Visit: Payer: Self-pay | Admitting: *Deleted

## 2015-09-12 NOTE — Patient Outreach (Signed)
Member had outpatient procedure (bronchoscopy) on Wednesday.  Call placed to member to follow up on current status post procedure and to inquire about further needs/assistance.  Member states that the procedure was "fine" and that she is "recovering well."  She states that her shortness of breath is getting better, "I just have to remember to pace myself."  She denies any further needs regarding her health, but does state that she has been thinking about changing her PCP.  She reports that she does not know how to go online to review eligible physicians for her insurance and will need to request a paper list.  This care manager advised member to ask her niece and/or nephew for assistance with the computer as she is waiting for the list.  She states "ok, I didn't think about that."  Member made aware that this care manager will follow up in 2 weeks regarding a final decision and status update on a new PCP.  She is aware that if she has no further needs that her case will be closed.  She verbalizes understanding and denies any questions at this time.  [name], BSN, [employer]  [facility] Care Manager [phone]

## 2015-09-15 ENCOUNTER — Telehealth: Payer: Self-pay | Admitting: *Deleted

## 2015-09-15 DIAGNOSIS — C3492 Malignant neoplasm of unspecified part of left bronchus or lung: Secondary | ICD-10-CM

## 2015-09-15 NOTE — Telephone Encounter (Signed)
Oncology Nurse Navigator Documentation  Oncology Nurse Navigator Flowsheets 09/15/2015  Treatment Phase Other  Barriers/Navigation Needs Coordination of Care  Interventions Coordination of Care/recieved referral from Dr. Roxan Hockey.  I called and scheduled her to be seen with Dr. Julien Nordmann on 09/18/15 at [facility].  She verbalized understanding of appt time and place.   Coordination of Care Appts  Acuity Level 1  Time Spent with Patient 15

## 2015-09-16 ENCOUNTER — Ambulatory Visit: Payer: Commercial Managed Care - HMO | Admitting: Thoracic Surgery (Cardiothoracic Vascular Surgery)

## 2015-09-17 ENCOUNTER — Telehealth: Payer: Self-pay | Admitting: *Deleted

## 2015-09-17 NOTE — Telephone Encounter (Signed)
Called and confirmed 09/17/14 clinic appt w/ pt.

## 2015-09-18 ENCOUNTER — Encounter: Payer: Self-pay | Admitting: Internal Medicine

## 2015-09-18 ENCOUNTER — Encounter: Payer: Self-pay | Admitting: *Deleted

## 2015-09-18 ENCOUNTER — Ambulatory Visit: Payer: Commercial Managed Care - HMO | Attending: Internal Medicine | Admitting: Physical Therapy

## 2015-09-18 ENCOUNTER — Other Ambulatory Visit (HOSPITAL_BASED_OUTPATIENT_CLINIC_OR_DEPARTMENT_OTHER): Payer: Commercial Managed Care - HMO

## 2015-09-18 ENCOUNTER — Ambulatory Visit (HOSPITAL_BASED_OUTPATIENT_CLINIC_OR_DEPARTMENT_OTHER): Payer: Commercial Managed Care - HMO | Admitting: Internal Medicine

## 2015-09-18 VITALS — BP 165/74 | HR 80 | Temp 97.6°F | Resp 17 | Ht 63.0 in | Wt 132.5 lb

## 2015-09-18 DIAGNOSIS — Z808 Family history of malignant neoplasm of other organs or systems: Secondary | ICD-10-CM

## 2015-09-18 DIAGNOSIS — C3412 Malignant neoplasm of upper lobe, left bronchus or lung: Secondary | ICD-10-CM | POA: Diagnosis not present

## 2015-09-18 DIAGNOSIS — M545 Low back pain, unspecified: Secondary | ICD-10-CM

## 2015-09-18 DIAGNOSIS — R5381 Other malaise: Secondary | ICD-10-CM | POA: Diagnosis not present

## 2015-09-18 DIAGNOSIS — Z87891 Personal history of nicotine dependence: Secondary | ICD-10-CM

## 2015-09-18 DIAGNOSIS — C3492 Malignant neoplasm of unspecified part of left bronchus or lung: Secondary | ICD-10-CM

## 2015-09-18 DIAGNOSIS — R109 Unspecified abdominal pain: Secondary | ICD-10-CM | POA: Diagnosis not present

## 2015-09-18 DIAGNOSIS — R293 Abnormal posture: Secondary | ICD-10-CM | POA: Diagnosis not present

## 2015-09-18 LAB — CBC WITH DIFFERENTIAL/PLATELET
BASO%: 0.5 % (ref 0.0–2.0)
Basophils Absolute: 0 10*3/uL (ref 0.0–0.1)
EOS%: 2.2 % (ref 0.0–7.0)
Eosinophils Absolute: 0.2 10*3/uL (ref 0.0–0.5)
HCT: 40.6 % (ref 34.8–46.6)
HGB: 13.1 g/dL (ref 11.6–15.9)
LYMPH#: 2.6 10*3/uL (ref 0.9–3.3)
LYMPH%: 36.8 % (ref 14.0–49.7)
MCH: 31.7 pg (ref 25.1–34.0)
MCHC: 32.3 g/dL (ref 31.5–36.0)
MCV: 98.2 fL (ref 79.5–101.0)
MONO#: 0.6 10*3/uL (ref 0.1–0.9)
MONO%: 8.2 % (ref 0.0–14.0)
NEUT%: 52.3 % (ref 38.4–76.8)
NEUTROS ABS: 3.7 10*3/uL (ref 1.5–6.5)
PLATELETS: 236 10*3/uL (ref 145–400)
RBC: 4.14 10*6/uL (ref 3.70–5.45)
RDW: 14.5 % (ref 11.2–14.5)
WBC: 7.2 10*3/uL (ref 3.9–10.3)

## 2015-09-18 LAB — COMPREHENSIVE METABOLIC PANEL
ALBUMIN: 3.9 g/dL (ref 3.5–5.0)
ALK PHOS: 83 U/L (ref 40–150)
ALT: 12 U/L (ref 0–55)
ANION GAP: 9 meq/L (ref 3–11)
AST: 19 U/L (ref 5–34)
BILIRUBIN TOTAL: 0.81 mg/dL (ref 0.20–1.20)
BUN: 10.8 mg/dL (ref 7.0–26.0)
CALCIUM: 9.2 mg/dL (ref 8.4–10.4)
CO2: 27 mEq/L (ref 22–29)
Chloride: 108 mEq/L (ref 98–109)
Creatinine: 1 mg/dL (ref 0.6–1.1)
EGFR: 62 mL/min/{1.73_m2} — AB (ref >90)
Glucose: 122 mg/dl (ref 70–140)
Potassium: 3.6 mEq/L (ref 3.5–5.1)
Sodium: 144 mEq/L (ref 136–145)
TOTAL PROTEIN: 7.3 g/dL (ref 6.4–8.3)

## 2015-09-18 NOTE — Progress Notes (Signed)
Oncology Nurse Navigator Documentation  Oncology Nurse Navigator Flowsheets 09/18/2015  Navigator Location [facility]Med On[facility]  Navigator Encounter Type Clinic/MDC/spoke with patient today at [facility].  Gave and explained lung cancer information.  Patient completed HIPPA form and distress screening  Abnormal Finding Date 05/26/2015  Confirmed Diagnosis Date 07/04/2015  Surgery Date 07/04/2015  Treatment Initiated Date 07/04/2015  Patient Visit Type [facility]  Treatment Phase Other  Barriers/Navigation Needs Education  Education Newly Diagnosed Cancer Education  Interventions Education Method  Education Method Verbal;Written  Acuity Level 2  Time Spent with Patient 30         Thoracic Treatment Summary Name:[name] Date:09/18/2015 DOB:[birth year] Your Medical Team Medical Oncologist:Dr. Mohamed Surgeon:Dr. Hendrickson Type and Stage of Lung Cancer Non-Small Cell Carcinoma: Adenocarcinoma   Clinical Stage:  Adenocarcinoma of left lung ([place])   Staging form: Lung, AJCC 7th Edition     Clinical stage from 09/18/2015: Stage IB (T2a, N0, M0) - Signed by Curt Bears, MD on 09/18/2015    Clinical stage is based on radiology exams.  Pathological stage will be determined after surgery.  Staging is based on the size of the tumor, involvement of lymph nodes or not, and whether or not the cancer center has spread. Recommendations Recommendations: Observation   These recommendations are based on information available as of today's consult.  This is subject to change depending further testing or exams. Next Steps Next Step: Medical Oncology will set up follow up appointments Barriers to Care What do you perceive as a potential barrier that may prevent you from receiving your treatment plan? Education-information given and explained  Transportation-patient relies on nephew to provide transportation. I asked that she call me if needed help with that   Resources Given: [employer] booklet  given Support/Resources Services at [email or web address] [phone] Risk for [place] information Navigator information    Questions Norton Blizzard, RN BSN Thoracic Oncology Nurse Navigator at [place] is a nurse navigator that is available to assist you through your cancer journey.  She can answer your questions and/or provide resources regarding your treatment plan, emotional support, or financial concerns.

## 2015-09-18 NOTE — Therapy (Signed)
[place], [place], [address] Phone: [phone]   Fax:  [phone]  Physical Therapy Evaluation  Patient Details  Name: [name] MRN: [id number] Date of Birth: 10-27-1940 Referring Provider: Dr. Curt Bears  Encounter Date: 09/18/2015      PT End of Session - 09/18/15 1440    Visit Number 1   Number of Visits 17   Date for PT Re-Evaluation 12/01/15   PT Start Time 8850   PT Stop Time 1405   PT Time Calculation (min) 30 min   Activity Tolerance Patient tolerated treatment well   Behavior During Therapy [facility] for tasks assessed/performed      Past Medical History  Diagnosis Date  . Hyperlipidemia   . Asthma   . COPD (chronic obstructive pulmonary disease) ([place])   . Shortness of breath   . GERD (gastroesophageal reflux disease)   . Thyroid nodule   . Depression   . Arthritis     Past Surgical History  Procedure Laterality Date  . Cesarean section    . Cataract extraction      left eye  . Microlaryngoscopy N/A 12/13/2014    Procedure: MICROLARYNGOSCOPY;  Surgeon: Jerrell Belfast, MD;  Location: [place];  Service: ENT;  Laterality: N/A;  . Mass excision N/A 12/13/2014    Procedure: EXCISION OF VOCAL CORD POLYPS;  Surgeon: Jerrell Belfast, MD;  Location: [place];  Service: ENT;  Laterality: N/A;  . Biopsy thyroid    . Cataract extraction w/ intraocular lens implant      RIGHT  . Video assisted thoracoscopy (vats)/ lobectomy Left 07/04/2015    Procedure: VIDEO ASSISTED THORACOSCOPY (VATS) LEFT UPPER LOBECTOMY;  Surgeon: Melrose Nakayama, MD;  Location: [place];  Service: Thoracic;  Laterality: Left;  . Video bronchoscopy with insertion of interbronchial valve (ibv) N/A 07/11/2015    Procedure: VIDEO BRONCHOSCOPY WITH INSERTION OF INTERBRONCHIAL VALVE (IBV);  Surgeon: Melrose Nakayama, MD;  Location: [place];  Service: Thoracic;  Laterality: N/A;  . Eye surgery    . Tubal ligation    .  Video bronchoscopy with insertion of interbronchial valve (ibv) N/A 09/10/2015    Procedure: VIDEO BRONCHOSCOPY WITH REMOVAL OF INTERBRONCHIAL VALVES;  Surgeon: Melrose Nakayama, MD;  Location: [place];  Service: Thoracic;  Laterality: N/A;    There were no vitals filed for this visit.  Visit Diagnosis:  Physical deconditioning - Plan: PT plan of care cert/re-cert  Posture abnormality - Plan: PT plan of care cert/re-cert  Midline low back pain without sciatica - Plan: PT plan of care cert/re-cert  Left flank pain - Plan: PT plan of care cert/re-cert      Subjective Assessment - 09/18/15 1418    Subjective having left flank surgical pain currently, and some shortness of breath; also reports chronic low back pain   Pertinent History Patient had abnormal low dose CT scan 11/2013 and had been followed for that reason.  Had CT and then PET in September 2016, then VATS lobectomy for left upper lobe invasive adenocarcinoma; had positive visceral pleura and positive lymphvascular invasion but negative nodes; staged at IB.  Planned course is to follow in survivorship clinic and watch with scans.  Had prolonged air leak so was readmitted for valve placement on 09/10/15.  Ex-smoker (54 pack-years).  Also reports chronic low back pain from DDD described as constant pain.  Has not done therapy or exercise for this.   Limitations Walking   Patient Stated  Goals get info from all lung clinic providers   Currently in Pain? Yes   Pain Score 3    Pain Location Flank   Pain Orientation Left   Pain Descriptors / Indicators Sharp;Jabbing   Pain Type Surgical pain   Aggravating Factors  staying in one position too long or bumping the spot   Pain Relieving Factors change position   Multiple Pain Sites Yes   Pain Location Back   Pain Orientation Lower   Pain Descriptors / Indicators Constant   Pain Type Chronic pain   Pain Relieving Factors Aleve (she also reports she ignores it); posterior pelvic tilt helps             [facility] PT Assessment - 09/18/15 0001    Assessment   Medical Diagnosis left upper lobe invasive adenocarcinoma   Referring Provider Dr. Curt Bears   Onset Date/Surgical Date 07/04/15  lobectomy + 09/10/15 for valve placement for air leak   Precautions   Precautions Other (comment)   Precaution Comments cancer precaution   Restrictions   Weight Bearing Restrictions No   Balance Screen   Has the patient fallen in the past 6 months No   Has the patient had a decrease in activity level because of a fear of falling?  No   Is the patient reluctant to leave their home because of a fear of falling?  No   Home Environment   Living Environment Private residence   Living Arrangements Other relatives  niece, nephew   Type of [place] Two level  but bedroom on 1st floor   Prior Function   Level of Independence Independent   Leisure no regular exercise other than walking in the house   Cognition   Overall Cognitive Status Within Functional Limits for tasks assessed   Observation/Other Assessments   Observations Well-looking and trim woman with posture impairments and holding left side due to pain at times.   Functional Tests   Functional tests Sit to Stand   Sit to Stand   Comments 8 times in 30 seconds, below average for age; reports dyspnea after this, but it is mild   Posture/Postural Control   Posture/Postural Control Postural limitations   Postural Limitations Increased lumbar lordosis  right lateral shift or scoliosis--pt. not aware of any scoli   ROM / Strength   AROM / PROM / Strength AROM   AROM   Overall AROM  Deficits   Overall AROM Comments trunk AROM in standing:  flexion--reaches fingertips 6 inches to floor; extension to just past neutral; sidebend WFL bilaterally; right rotation WFL and left 50% loss with c/o pain at left scapula   Ambulation/Gait   Ambulation/Gait Yes   Ambulation/Gait Assistance 7: Independent  but reports distance  and speed are limited by SOB                           PT Education - 09/18/15 1439    Education provided Yes   Education Details posture, breathing, walking, energy conservation, staying active, PT info; also [employer] at the [employer]) Educated Patient   Methods Explanation;Demonstration;Handout   Comprehension Verbalized understanding           Short Term Clinic Goals - 09/18/15 1448    CC Short Term Goal  #1   Title 6 minute walk distance will be assessed and a goal set related to improving performance.   Time  2   Period Weeks   Status New   CC Short Term Goal  #2   Title Patient will be independent with a home exercise program for endurance exercise and basic strengthening.   Time 4   Period Weeks   Status New   CC Short Term Goal  #3   Title Patient will report at least 50% decrease in left flank pain.   Time 4   Period [place] [facility] Goals - 10-Oct-2015 1448    Patient will be able to verbalize understanding of the benefit of exercise to decrease fatigue.   Status Achieved   Patient will be able to verbalize the importance of posture.   Status Achieved   Patient will be able to demonstrate diaphragmatic breathing for improved lung function.   Status Achieved   Patient will be able to verbalize understanding of the role of physical therapy to prevent functional decline and who to contact if physical therapy is needed.   Status Achieved         Long Term Clinic Goals - 2015-10-10 1518    CC Long Term Goal  #1   Title (Long term goals to be set once 6 minute walk test has been performed as well as other possible assessments)            Plan - October 10, 2015 1441    Clinical Impression Statement This is a very pleasant and trim 75 year-old woman here alone who has undergone left upper lobectomy for invasive adenocarcinoma.  She has a posture impairment, chronic low back pain for which she has never received  treatment, pain from surgery, limited trunk ROM, shortness of breath on exertion, deconditioning, and is not currently exercising.  She would benefit from PT for these deficits, and particularly for her deconditioning and SOB s/p lobectomy.   Pt will benefit from skilled therapeutic intervention in order to improve on the following deficits Cardiopulmonary status limiting activity;Decreased mobility;Pain;Decreased range of motion   Rehab Potential Good   PT Frequency 2x / week   PT Duration 4 weeks   PT Treatment/Interventions Therapeutic exercise;Patient/family education;Passive range of motion;Electrical Stimulation;Moist Heat   PT Next Visit Plan Patient wants to check with her nephew, who is a flight attendant, about being able to get to therapy.  If able, assess 6 minute walking distance and begin conditioning exercise and HEP instruction.   PT Home Exercise Plan see education section   Consulted and Agree with Plan of Care Patient          G-Codes - 10-Oct-2015 1520    Functional Assessment Tool Used clinical judgement   Functional Limitation Changing and maintaining body position   Changing and Maintaining Body Position Current Status [phone]) At least 40 percent but less than 60 percent impaired, limited or restricted   Changing and Maintaining Body Position Goal Status ([id number]) At least 20 percent but less than 40 percent impaired, limited or restricted       Problem List Patient Active Problem List   Diagnosis Date Noted  . Adenocarcinoma of left lung ([place]) 07/04/2015  . 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 Lung nodule 10/17/2014  . Multinodular goiter (nontoxic) 07/18/2014  . Malnutrition of moderate degree ([place]) 05/24/2014  . Moderate malnutrition ([place]) 05/24/2014  . Other and unspecified hyperlipidemia 05/23/2014  . Rhinitis, allergic 11/22/2013  . COPD (chronic obstructive pulmonary disease) ([place])  12/15/2012    [name] 09/18/2015, 3:24 PM  [facility] [place], [place], [address] Phone: [phone]   Fax:  [phone]  Name: [name] MRN: [id number] Date of Birth: 04-11-1941   Serafina Royals, PT 09/18/2015 3:24 PM

## 2015-09-18 NOTE — Progress Notes (Signed)
[place] Telephone:([phone]) [phone]   Fax:([phone] Multidisciplinary thoracic oncology clinic  CONSULT NOTE  REFERRING PHYSICIAN: Dr. Modesto Charon  REASON FOR CONSULTATION:  75 years old recently diagnosed with lung cancer.  HPI [name] is a 75 y.o. female with past medical history significant for COPD, asthma, dyslipidemia, GERD, depression, arthritis as well as history of thyroid nodule as well as long history of smoking. The patient had CT scan of the chest on 05/26/2015 and it showed nodular density seen posteriorly in the left upper lobe increased in size from the previous exam and measured 1.2 x 1.1 x 0.9 cm. It appears more solid. The patient was seen by Dr. Elsworth Soho and a PET scan was performed on 06/05/2015 and it showed hypermetabolic 1.1 cm central left upper lobe pulmonary nodule suspicious for bronchogenic carcinoma. There was no evidence of thoracic nodal or distant metastatic disease. There was also mildly enlarged thyroid with diffuse hypermetabolic activity suspicious for thyroiditis. The patient was referred to Dr. Roxan Hockey and on 07/04/2015 she underwent left VATS with left upper lobectomy and mediastinal lymph node dissection. The final pathology (Accession: [id number]) showed invasive adenocarcinoma, moderately differentiated is spanning 1.6 cm. There was adenocarcinoma involving the visceral pleura in addition to lymphovascular invasion. The resection margins as well as the dissected lymph nodes were negative for malignancy. Her surgery was complicated with postoperative persistent air leak and the patient underwent intrabronchial valve placement by Dr. Roxan Hockey. She was [facility] from [facility] recently. She continues to have pain on the right side of the chest and very uncomfortable with certain positions. She also has shortness breath at baseline and increased with exertion and nonproductive cough. She denied having any  hemoptysis. The patient denied having any significant weight loss or night sweats. She has no headache or visual changes. She denied having any significant nausea, vomiting, diarrhea or constipation. Dr. Roxan Hockey kindly referred the patient to me today for evaluation and discussion of adjuvant therapy as needed. Family history significant for a mother with diabetes mellitus and end stage renal disease, father died from gunshot. The patient is widowed and has 5 children. She used to work in [employer]. She has a history of smoking up to 2 packs per day for around 56 years and quit 07/03/2015 1 day before her surgery. She has no history of alcohol or drug abuse.  HPI  Past Medical History  Diagnosis Date  . Hyperlipidemia   . Asthma   . COPD (chronic obstructive pulmonary disease) ([place])   . Shortness of breath   . GERD (gastroesophageal reflux disease)   . Thyroid nodule   . Depression   . Arthritis     Past Surgical History  Procedure Laterality Date  . Cesarean section    . Cataract extraction      left eye  . Microlaryngoscopy N/A 12/13/2014    Procedure: MICROLARYNGOSCOPY;  Surgeon: Jerrell Belfast, MD;  Location: [place];  Service: ENT;  Laterality: N/A;  . Mass excision N/A 12/13/2014    Procedure: EXCISION OF VOCAL CORD POLYPS;  Surgeon: Jerrell Belfast, MD;  Location: [place];  Service: ENT;  Laterality: N/A;  . Biopsy thyroid    . Cataract extraction w/ intraocular lens implant      RIGHT  . Video assisted thoracoscopy (vats)/ lobectomy Left 07/04/2015    Procedure: VIDEO ASSISTED THORACOSCOPY (VATS) LEFT UPPER LOBECTOMY;  Surgeon: Melrose Nakayama, MD;  Location: [place];  Service: Thoracic;  Laterality: Left;  .  Video bronchoscopy with insertion of interbronchial valve (ibv) N/A 07/11/2015    Procedure: VIDEO BRONCHOSCOPY WITH INSERTION OF INTERBRONCHIAL VALVE (IBV);  Surgeon: Melrose Nakayama, MD;  Location: [place];  Service: Thoracic;  Laterality: N/A;  . Eye surgery      . Tubal ligation    . Video bronchoscopy with insertion of interbronchial valve (ibv) N/A 09/10/2015    Procedure: VIDEO BRONCHOSCOPY WITH REMOVAL OF INTERBRONCHIAL VALVES;  Surgeon: Melrose Nakayama, MD;  [facility];  Service: Thoracic;  Laterality: N/A;    Family History  Problem Relation Age of Onset  . Renal cancer Brother   . Diabetes Mother   . Hypertension Mother   . Renal Disease Mother     Social History Social History  Substance Use Topics  . Smoking status: Former Smoker -- 1.00 packs/day for 54 years    Types: Cigarettes  . Smokeless tobacco: Former Systems developer    Quit date: 07/04/2015     Comment: Currently smoking 5-6 cigarettes per day.  . Alcohol Use: No    Allergies  Allergen Reactions  . Albuterol Shortness Of Breath    Current Outpatient Prescriptions  Medication Sig Dispense Refill  . albuterol (PROVENTIL HFA;VENTOLIN HFA) 108 (90 BASE) MCG/ACT inhaler Inhale 2 puffs into the lungs every 6 (six) hours as needed for wheezing or shortness of breath. 1 Inhaler 11  . alum & mag hydroxide-simeth (MAALOX/MYLANTA) 200-200-20 MG/5ML suspension Take 30 mLs by mouth as needed for indigestion or heartburn. 355 mL 0  . aspirin 81 MG tablet Take 81 mg by mouth every evening.     . donepezil (ARICEPT) 5 MG tablet Take 5 mg by mouth every evening.  0  . ezetimibe (ZETIA) 10 MG tablet Take 10 mg by mouth at bedtime.     . mirtazapine (REMERON) 15 MG tablet Take 7.5 mg by mouth at bedtime.   0  . niacin (NIASPAN) 500 MG CR tablet Take 500 mg by mouth every evening.     Marland Kitchen oxyCODONE (OXY IR/ROXICODONE) 5 MG immediate release tablet Take 1 tablet (5 mg total) by mouth every 6 (six) hours as needed for severe pain. 40 tablet 0  . pantoprazole (PROTONIX) 40 MG tablet Take 40 mg by mouth 2 (two) times daily.    . Tiotropium Bromide Monohydrate (SPIRIVA RESPIMAT) 2.5 MCG/ACT AERS INHALE 2 PUFFS INTO THE LUNGS DAILY. 4 g 2  . valACYclovir (VALTREX) 500 MG tablet Take 500 mg by  mouth daily.    Dema Severin Petrolatum-Mineral Oil (SYSTANE NIGHTTIME OP) Apply 1 drop to eye at bedtime as needed.     No current facility-administered medications for this visit.    Review of Systems  Constitutional: positive for fatigue Eyes: negative Ears, nose, mouth, throat, and face: negative Respiratory: positive for cough, dyspnea on exertion and pleurisy/chest pain Cardiovascular: negative Gastrointestinal: negative Genitourinary:negative Integument/breast: negative Hematologic/lymphatic: negative Musculoskeletal:negative Neurological: negative Behavioral/Psych: negative Endocrine: negative Allergic/Immunologic: negative  Physical Exam  NWG:NFAOZ, healthy, no distress, well nourished and well developed SKIN: skin color, texture, turgor are normal, no rashes or significant lesions HEAD: Normocephalic, No masses, lesions, tenderness or abnormalities EYES: normal, PERRLA, Conjunctiva are pink and non-injected EARS: External ears normal, Canals clear OROPHARYNX:no exudate, no erythema and lips, buccal mucosa, and tongue normal  NECK: supple, no adenopathy, no JVD LYMPH:  no palpable lymphadenopathy, no hepatosplenomegaly BREAST:not examined LUNGS: clear to auscultation , and palpation HEART: regular rate & rhythm, no murmurs and no gallops ABDOMEN:abdomen soft, non-tender, normal bowel  sounds and no masses or organomegaly BACK: Back symmetric, no curvature., No CVA tenderness EXTREMITIES:no joint deformities, effusion, or inflammation, no edema, no skin discoloration  NEURO: alert & oriented x 3 with fluent speech, no focal motor/sensory deficits  PERFORMANCE STATUS: ECOG 1  LABORATORY DATA: Lab Results  Component Value Date   WBC 7.2 09/18/2015   HGB 13.1 09/18/2015   HCT 40.6 09/18/2015   MCV 98.2 09/18/2015   PLT 236 09/18/2015      Chemistry      Component Value Date/Time   NA 143 09/03/2015 1214   K 3.7 09/03/2015 1214   CL 107 09/03/2015 1214   CO2  29 09/03/2015 1214   BUN 8 09/03/2015 1214   CREATININE 0.95 09/03/2015 1214      Component Value Date/Time   CALCIUM 9.5 09/03/2015 1214   ALKPHOS 79 09/03/2015 1214   AST 25 09/03/2015 1214   ALT 17 09/03/2015 1214   BILITOT 1.2 09/03/2015 1214       RADIOGRAPHIC STUDIES: Dg Chest 2 View  09/10/2015  CLINICAL DATA:  Preop, endobronchial valve removal, adenocarcinoma. EXAM: CHEST  2 VIEW COMPARISON:  08/05/2015. FINDINGS: Slight leftward deviation of the mediastinal structures with overall volume loss in the left hemi thorax, stable. Four endobronchial valves are seen on the left, stable in position. Heart size normal. Lungs appear emphysematous but are otherwise clear. No pleural fluid. IMPRESSION: Four left endobronchial valves, stable in position. Electronically Signed   By: Lorin Picket M.D.   On: 09/10/2015 08:12   US Soft Tissue Head/neck  08/21/2015  CLINICAL DATA:  Multi nodular goiter EXAM: THYROID ULTRASOUND TECHNIQUE: Ultrasound examination of the thyroid gland and adjacent soft tissues was performed. COMPARISON:  07/22/2014 FINDINGS: Right thyroid lobe Measurements: 5.3 x 2.4 x 3.0 cm. Dominant right lower pole predominately solid nodule measures 3.1 x 1.8 x 2.0 cm. Previously this measured 3.2 x 1.8 x 2.1 cm. 11 mm upper pole nodule. Color Doppler imaging demonstrates normal vascularity in the gland. Left thyroid lobe Measurements: 4.5 x 2.0 x 2.2 cm. Left lower pole nodule measures 14 x 8 x 11 mm and previously measured 9 x 14 x 9 mm. Lower pole nodule measures 13 x 9 x 10 mm and previously measured 13 x 9 x 8 mm. Color Doppler imaging demonstrates normal vascularity in the gland. Isthmus Thickness: 4 mm.  No nodules visualized. Lymphadenopathy None visualized. IMPRESSION: Bilateral nodules are stable. Dominant nodule measures 3.1 cm in the right lower pole. Electronically Signed   By: Marybelle Killings M.D.   On: 08/21/2015 16:59   Dg Chest Port 1 View  09/10/2015  CLINICAL DATA:   Status post bronchoscopy. EXAM: PORTABLE CHEST 1 VIEW COMPARISON:  September 10, 2015. FINDINGS: The heart size and mediastinal contours are within normal limits. Both lungs are clear. Status post left hilar surgical clips are noted. Endobronchial valves noted on prior exam have been removed. No pneumothorax or pleural effusion is noted. The visualized skeletal structures are unremarkable. IMPRESSION: No acute cardiopulmonary abnormality seen. Electronically Signed   By: Marijo Conception, M.D.   On: 09/10/2015 10:00    ASSESSMENT: This is a very pleasant 74 years old African-American female recently diagnosed with a stage IB (T2a, N0, M0) non-small cell lung cancer, adenocarcinoma presented with left upper lobe nodule status post left upper lobectomy with lymph node dissection in October 2016.   PLAN: I had a lengthy discussion with the patient today about her current disease stage, prognosis and  treatment options. I explained to the patient that there is no clear survival benefit for adjuvant systemic chemotherapy for patient with stage IB if the tumor size is less than 4.0 cm. The patient has some risk factor like visceral pleural invasion as well as lymphovascular invasion. I recommended for her close observation and monitoring. I will complete the staging workup by ordering CT scan of the head to rule out any brain metastasis. I will arrange for the patient to come back for follow-up visit in 6 months for reevaluation and repeat CT scan of the chest for restaging of her disease. The patient was seen during the multidisciplinary thoracic oncology clinic today by medical oncology, thoracic navigator and physical therapist. She was advised to call immediately if she has any concerning symptoms in the interval. The patient voices understanding of current disease status and treatment options and is in agreement with the current care plan.  All questions were answered. The patient knows to call the clinic  with any problems, questions or concerns. We can certainly see the patient much sooner if necessary.  Thank you so much for allowing me to participate in the care of [place]. I will continue to follow up the patient with you and assist in her care.  I spent 40 minutes counseling the patient face to face. The total time spent in the appointment was 60 minutes.  Disclaimer: This note was dictated with voice recognition software. Similar sounding words can inadvertently be transcribed and may not be corrected upon review.   Tiki Tucciarone K. September 18, 2015, 1:15 PM

## 2015-09-22 ENCOUNTER — Other Ambulatory Visit: Payer: Self-pay | Admitting: Thoracic Surgery (Cardiothoracic Vascular Surgery)

## 2015-09-22 DIAGNOSIS — C3490 Malignant neoplasm of unspecified part of unspecified bronchus or lung: Secondary | ICD-10-CM

## 2015-09-23 ENCOUNTER — Ambulatory Visit (INDEPENDENT_AMBULATORY_CARE_PROVIDER_SITE_OTHER): Payer: Self-pay | Admitting: Thoracic Surgery (Cardiothoracic Vascular Surgery)

## 2015-09-23 ENCOUNTER — Encounter: Payer: Self-pay | Admitting: Thoracic Surgery (Cardiothoracic Vascular Surgery)

## 2015-09-23 ENCOUNTER — Ambulatory Visit
Admission: RE | Admit: 2015-09-23 | Discharge: 2015-09-23 | Disposition: A | Discharge status: Home / Self Care | Payer: Commercial Managed Care - HMO | Source: Ambulatory Visit | Attending: Thoracic Surgery (Cardiothoracic Vascular Surgery) | Admitting: Thoracic Surgery (Cardiothoracic Vascular Surgery)

## 2015-09-23 VITALS — BP 150/79 | HR 69 | Resp 20 | Ht 63.0 in | Wt 132.0 lb

## 2015-09-23 DIAGNOSIS — C3490 Malignant neoplasm of unspecified part of unspecified bronchus or lung: Secondary | ICD-10-CM

## 2015-09-23 DIAGNOSIS — R918 Other nonspecific abnormal finding of lung field: Secondary | ICD-10-CM | POA: Diagnosis not present

## 2015-09-23 DIAGNOSIS — D381 Neoplasm of uncertain behavior of trachea, bronchus and lung: Secondary | ICD-10-CM

## 2015-09-23 NOTE — Progress Notes (Signed)
[name] [address]       [name],[name] [id number]             [phone]       HPI: [name] returns today for scheduled follow-up visit.  She is a 75 year old woman who had a thoracoscopic left upper lobectomy on 07/04/2015 for a stage IB (T2a, N0) adenocarcinoma. Her postoperative course was complicated by a prolonged air leak. She initially did not have an air leak and her chest tubes were removed on day 3. However she had to have a chest tube replaced the following day after she developed severe subcutaneous emphysema. She had a large air leak that persisted. Eventually I placed endobronchial valves on 07/11/2015. After that her air leak resolved and her chest tube was removed. She was finally discharged home on 07/22/2015.  I remove the endobronchial valves on January 4. She did not have any problems with that procedure.  In the interim she saw Dr. Julien [name] last week. He did not recommend any adjuvant chemotherapy area  She is scheduled to have an MR of the brain tomorrow to complete her staging  Her breathing is not back to her preoperative baseline. She has not had any episodes of wheezing or severe shortness of breath. She says she feels aware that part of her lung is missing. Her incisional pain is improved since her last visit, but is still present. Her primary complaint today is transient lightheadedness when she moves her head in a certain way. She says this is an overall sensation of lightheadedness. She does not notice any dizziness or visual abnormalities. She says she does not feel like she will pass out. She says it usually last a second or 2 resolves.  Past Medical History  Diagnosis Date  . Hyperlipidemia   . Asthma   . COPD (chronic obstructive pulmonary disease) ([place])   . Shortness of breath   . GERD (gastroesophageal reflux disease)   . Thyroid nodule   . Depression   . Arthritis      Current Outpatient Prescriptions  Medication Sig Dispense  Refill  . albuterol (PROVENTIL HFA;VENTOLIN HFA) 108 (90 BASE) MCG/ACT inhaler Inhale 2 puffs into the lungs every 6 (six) hours as needed for wheezing or shortness of breath. 1 Inhaler 11  . alum & mag hydroxide-simeth (MAALOX/MYLANTA) 200-200-20 MG/5ML suspension Take 30 mLs by mouth as needed for indigestion or heartburn. 355 mL 0  . aspirin 81 MG tablet Take 81 mg by mouth every evening.     . donepezil (ARICEPT) 5 MG tablet Take 5 mg by mouth every evening.  0  . ezetimibe (ZETIA) 10 MG tablet Take 10 mg by mouth at bedtime.     . mirtazapine (REMERON) 15 MG tablet Take 7.5 mg by mouth at bedtime.   0  . niacin (NIASPAN) 500 MG CR tablet Take 500 mg by mouth every evening.     Marland Kitchen OVER THE COUNTER MEDICATION Take 1 capsule by mouth daily as needed.    Marland Kitchen oxyCODONE (OXY IR/ROXICODONE) 5 MG immediate release tablet Take 1 tablet (5 mg total) by mouth every 6 (six) hours as needed for severe pain. 40 tablet 0  . pantoprazole (PROTONIX) 40 MG tablet Take 40 mg by mouth 2 (two) times daily.    . Tiotropium Bromide Monohydrate (SPIRIVA RESPIMAT) 2.5 MCG/ACT AERS INHALE 2 PUFFS INTO THE LUNGS DAILY. 4 g 2  . valACYclovir (VALTREX) 500 MG tablet Take 500 mg by mouth  daily.    . White Petrolatum-Mineral Oil (SYSTANE NIGHTTIME OP) Apply 1 drop to eye at bedtime as needed.     No current facility-administered medications for this visit.    Physical Exam BP 150/79 mmHg  Pulse 69  Resp 20  Ht '5\' 3"'$  (1.6 m)  Wt 132 lb (59.875 kg)  BMI 23.39 kg/m2  SpO53 51% 74 year old woman in no acute distress Alert and oriented 3 with no focal neurologic deficits No carotid bruits Cardiac regular rate and rhythm normal S1 and S2 2/6 systolic murmur Lungs diminished at left base, otherwise clear Incisions well healed  Diagnostic Tests: CHEST 2 VIEW  COMPARISON: 09/10/2015  FINDINGS: Normal heart size. Right lung is hyper aerated and clear. Scarring at the left base. Volume loss in the left lung  consistent with lobectomy.  IMPRESSION: Postoperative changes. No active cardiopulmonary disease.   Electronically Signed  By: [name] M.D.  On: 09/23/2015 11:26  I personally reviewed the chest x-ray and concur with the findings as noted above  Impression: 75 year old woman who is now about 2-1/2 months out from a left upper lobectomy for stage IB non-small cell carcinoma (adenocarcinoma). She had a prolonged air leak requiring endobronchial valves. The valves have been removed without any issues.  She still notices some decrease in her respiratory capacity. That may continue to improve some over the next few months, but I doubt we see any dramatic improvement.  I am not sure what to make of this transient lightheadedness that is positional. She will talk to her primary doctor about that. He could be related to her surgery, but I have not heard that specific complaint previously.  She saw Dr. Julien [name]. She does not need adjuvant chemotherapy. He will follow her up with periodic CT scans. She is scheduled to have an MR of the brain tomorrow to complete her staging.  Plan: I'll plan to see her back in about 4 months after her next CT scan and visit with Dr. Julien [name].  I will be happy to see her back sooner if I can be of any further assistance with her care.  Shannon Nakayama, MD [facility] [phone]

## 2015-09-24 ENCOUNTER — Other Ambulatory Visit: Payer: Self-pay | Admitting: Internal Medicine

## 2015-09-24 ENCOUNTER — Ambulatory Visit (HOSPITAL_COMMUNITY)
Admission: RE | Admit: 2015-09-24 | Discharge: 2015-09-24 | Disposition: A | Discharge status: Home / Self Care | Payer: Commercial Managed Care - HMO | Source: Ambulatory Visit | Attending: Internal Medicine | Admitting: Internal Medicine

## 2015-09-24 DIAGNOSIS — Z9889 Other specified postprocedural states: Secondary | ICD-10-CM | POA: Diagnosis not present

## 2015-09-24 DIAGNOSIS — C3492 Malignant neoplasm of unspecified part of left bronchus or lung: Secondary | ICD-10-CM | POA: Insufficient documentation

## 2015-09-24 DIAGNOSIS — R413 Other amnesia: Secondary | ICD-10-CM | POA: Diagnosis not present

## 2015-09-24 DIAGNOSIS — J439 Emphysema, unspecified: Secondary | ICD-10-CM | POA: Insufficient documentation

## 2015-09-24 MED ORDER — IOHEXOL 300 MG/ML  SOLN
75.0000 mL | Freq: Once | INTRAMUSCULAR | Status: AC | PRN
  Administered 2015-09-24: 75 mL via INTRAVENOUS

## 2015-09-26 ENCOUNTER — Encounter: Payer: Self-pay | Admitting: *Deleted

## 2015-09-26 ENCOUNTER — Other Ambulatory Visit: Payer: Self-pay | Admitting: *Deleted

## 2015-09-26 NOTE — Patient Outreach (Signed)
Call made to member to follow up on the need to continue involvement and the need for assistance with finding new PCP.  She states that she has decided to stay with her current physician at this time and has an appointment scheduled for 2/8.  She reports that she has had all follow up appointments regarding her cancer diagnosis and has had her brain scan.  Member denies the need for further assistance, stating that her family (niece, nephew, and daughter) are all involved in her care.  Made aware that she is able to contact [facility] care management in the future for assistance.  Will notify care management assistant and PCP of case closure.  [name], BSN, [facility]  [facility] Care Manager [phone]

## 2015-09-30 ENCOUNTER — Encounter: Payer: Self-pay | Admitting: *Deleted

## 2015-09-30 DIAGNOSIS — C3492 Malignant neoplasm of unspecified part of left bronchus or lung: Secondary | ICD-10-CM

## 2015-09-30 NOTE — Progress Notes (Signed)
Oncology Nurse Navigator Documentation  Oncology Nurse Navigator Flowsheets 09/30/2015  Navigator Location -  Navigator Encounter Type Other/I noticed that [name]'s CT Chest in 6 months has not been scheduled.  I clarified with Dr. Julien Nordmann.  I completed order and POF to schedule CT Chest in 6 months.    Barriers/Navigation Needs Coordination of Care  Interventions Coordination of Care  Coordination of Care Appts  Acuity Level 1  Time Spent with Patient 15

## 2015-10-10 ENCOUNTER — Other Ambulatory Visit: Payer: Self-pay | Admitting: *Deleted

## 2015-10-10 DIAGNOSIS — G8918 Other acute postprocedural pain: Secondary | ICD-10-CM

## 2015-10-10 MED ORDER — TRAMADOL HCL 50 MG PO TABS: 50.0000 mg | ORAL_TABLET | Freq: Four times a day (QID) | ORAL | Status: DC | PRN

## 2015-10-10 NOTE — Telephone Encounter (Signed)
[name] has called for a pain med refill, requesting Oxycodone. Her lung lobectomy was just over three months ago and I suggested reducing the strength of her medication and she was agreeable. I said I would fax a new script for Tramadol to her pharmacy and she agreed.

## 2015-10-15 DIAGNOSIS — C3492 Malignant neoplasm of unspecified part of left bronchus or lung: Secondary | ICD-10-CM | POA: Diagnosis not present

## 2015-10-15 DIAGNOSIS — E784 Other hyperlipidemia: Secondary | ICD-10-CM | POA: Diagnosis not present

## 2015-10-15 DIAGNOSIS — N898 Other specified noninflammatory disorders of vagina: Secondary | ICD-10-CM | POA: Diagnosis not present

## 2015-11-11 ENCOUNTER — Ambulatory Visit: Payer: Commercial Managed Care - HMO | Admitting: Pulmonary Disease

## 2015-11-13 ENCOUNTER — Other Ambulatory Visit: Payer: Self-pay | Admitting: Pulmonary Disease

## 2015-11-24 ENCOUNTER — Encounter: Payer: Self-pay | Admitting: Adult Health

## 2015-11-24 ENCOUNTER — Ambulatory Visit (INDEPENDENT_AMBULATORY_CARE_PROVIDER_SITE_OTHER): Payer: Commercial Managed Care - HMO | Admitting: Adult Health

## 2015-11-24 VITALS — BP 140/82 | HR 78 | Temp 98.0°F | Ht 63.0 in | Wt 139.0 lb

## 2015-11-24 DIAGNOSIS — C3492 Malignant neoplasm of unspecified part of left bronchus or lung: Secondary | ICD-10-CM | POA: Diagnosis not present

## 2015-11-24 DIAGNOSIS — J449 Chronic obstructive pulmonary disease, unspecified: Secondary | ICD-10-CM | POA: Diagnosis not present

## 2015-11-24 NOTE — Patient Instructions (Signed)
Continue on Spiriva daily. Follow up Dr. Elsworth Soho in 4 months and As needed   Please contact office for sooner follow up if symptoms do not improve or worsen or seek emergency care

## 2015-11-24 NOTE — Progress Notes (Signed)
Subjective:    Patient ID: [name], female    DOB: 01/29/1941, 75 y.o.   MRN: [id number]  HPI  PCP- Willey Blade   75 year old  smoker for FU of gold B COPD.  She started smoking as a teenager and smoked about 60 pack years. She worked in a [employer] for 20 years until she retired in 40.   TEST  CT chest 05/2015 >>left upper lobe lung nodule  increased in size and more solid PET 10/4266 >> hypermetabolic PFT - FEV1 &3 %, DLCO 48%   3/20/2017Follow up : COPD and Lung Cancer  Pt returns for 4 month follow up .  CT chest done in Sept showed  increased nodular density seen posteriorly in the left upper lobe increased in size from the previous exam and measured 1.2 x 1.1 x 0.9 cm. PET scan was performed on 06/05/2015 and it showed hypermetabolic 1.1 cm central left upper lobe pulmonary nodule suspicious for bronchogenic carcinoma. There was no evidence of thoracic nodal or distant metastatic disease. There was also mildly enlarged thyroid with diffuse hypermetabolic activity suspicious for thyroiditis. The patient was referred to Dr. Roxan Hockey and on 07/04/2015 she underwent left VATS with left upper lobectomy and mediastinal lymph node dissection. Path  showed invasive adenocarcinoma,  adenocarcinoma involving the visceral pleura in addition to lymphovascular invasion. The resection margins as well as the dissected lymph nodes were negative for malignancy. Follow up CT chest 09/24/15 with post op changes in left hemithorax , scarring in left base , no recurrence noted.  She remains on Spiriva daily. Stopped smoking in Oct right before surgery .  Has follow up with oncology in June.   Past Medical History  Diagnosis Date  . Hyperlipidemia   . Asthma   . COPD (chronic obstructive pulmonary disease) ([place])   . Shortness of breath   . GERD (gastroesophageal reflux disease)   . Thyroid nodule   . Depression   . Arthritis    Current Outpatient Prescriptions on File Prior  to Visit  Medication Sig Dispense Refill  . aspirin 81 MG tablet Take 81 mg by mouth every evening.     . donepezil (ARICEPT) 5 MG tablet Take 5 mg by mouth every evening.  0  . ezetimibe (ZETIA) 10 MG tablet Take 10 mg by mouth at bedtime.     . mirtazapine (REMERON) 15 MG tablet Take 7.5 mg by mouth at bedtime.   0  . niacin (NIASPAN) 500 MG CR tablet Take 500 mg by mouth every evening.     Marland Kitchen OVER THE COUNTER MEDICATION Take 1 capsule by mouth daily as needed.    . pantoprazole (PROTONIX) 40 MG tablet Take 40 mg by mouth 2 (two) times daily.    Marland Kitchen SPIRIVA RESPIMAT 2.5 MCG/ACT AERS INHALE 2 PUFFS INTO THE LUNGS DAILY. 4 g 1  . traMADol (ULTRAM) 50 MG tablet Take 1-2 tablets (50-100 mg total) by mouth every 6 (six) hours as needed. 40 tablet 0  . valACYclovir (VALTREX) 500 MG tablet Take 500 mg by mouth daily.    Dema Severin Petrolatum-Mineral Oil (SYSTANE NIGHTTIME OP) Apply 1 drop to eye at bedtime as needed.    Marland Kitchen albuterol (PROVENTIL HFA;VENTOLIN HFA) 108 (90 BASE) MCG/ACT inhaler Inhale 2 puffs into the lungs every 6 (six) hours as needed for wheezing or shortness of breath. (Patient not taking: Reported on 11/24/2015) 1 Inhaler 11  . alum & mag hydroxide-simeth (MAALOX/MYLANTA) 200-200-20 MG/5ML suspension Take 30 mLs  by mouth as needed for indigestion or heartburn. (Patient not taking: Reported on 11/24/2015) 355 mL 0  . oxyCODONE (OXY IR/ROXICODONE) 5 MG immediate release tablet Take 1 tablet (5 mg total) by mouth every 6 (six) hours as needed for severe pain. (Patient not taking: Reported on 11/24/2015) 40 tablet 0   No current facility-administered medications on file prior to visit.     Review of Systems neg for any significant sore throat, dysphagia, itching, sneezing, nasal congestion or excess/ purulent secretions, fever, chills, sweats, unintended wt loss, pleuritic or exertional cp, hempoptysis, orthopnea pnd or change in chronic leg swelling. Also denies presyncope, palpitations,  heartburn, abdominal pain, nausea, vomiting, diarrhea or change in bowel or urinary habits, dysuria,hematuria, rash, arthralgias, visual complaints, headache, numbness weakness or ataxia.     Objective:   Physical Exam  Filed Vitals:   11/24/15 1550  BP: 140/82  Pulse: 78  Temp: 98 F (36.7 C)  TempSrc: Oral  Height: '5\' 3"'$  (1.6 m)  Weight: 139 lb (63.05 kg)  SpO2: 96%    Gen. Pleasant, well-nourished, in no distress ENT - no lesions, no post nasal drip Neck: No JVD, no thyromegaly, no carotid bruits Lungs: no use of accessory muscles, no dullness to percussion, clear without rales or rhonchi  Cardiovascular: Rhythm regular, heart sounds  normal, no murmurs or gallops, no peripheral edema Musculoskeletal: No deformities, no cyanosis or clubbing    Tammy Parrett NP-C  [facility]  11/24/15     Assessment & Plan:

## 2015-11-25 NOTE — Assessment & Plan Note (Signed)
Compensated on present regimen   Plan  Continue on Spiriva daily. Follow up Dr. Elsworth Soho in 4 months and As needed   Please contact office for sooner follow up if symptoms do not improve or worsen or seek emergency care

## 2015-11-25 NOTE — Assessment & Plan Note (Signed)
Left sided adenocarcinoma of lung s/p LUL lobectomy  Serial CT chest in Jan with no evidence of recurrence  Plans to follow up with oncolgoy in June with follow up CT chest

## 2015-12-02 NOTE — Progress Notes (Signed)
Reviewed & agree with plan  

## 2016-01-01 DIAGNOSIS — H1013 Acute atopic conjunctivitis, bilateral: Secondary | ICD-10-CM | POA: Diagnosis not present

## 2016-01-01 DIAGNOSIS — H26492 Other secondary cataract, left eye: Secondary | ICD-10-CM | POA: Diagnosis not present

## 2016-01-01 DIAGNOSIS — H26491 Other secondary cataract, right eye: Secondary | ICD-10-CM | POA: Diagnosis not present

## 2016-01-01 DIAGNOSIS — H04123 Dry eye syndrome of bilateral lacrimal glands: Secondary | ICD-10-CM | POA: Diagnosis not present

## 2016-01-06 ENCOUNTER — Other Ambulatory Visit: Payer: Self-pay | Admitting: Pulmonary Disease

## 2016-02-10 DIAGNOSIS — H26491 Other secondary cataract, right eye: Secondary | ICD-10-CM | POA: Diagnosis not present

## 2016-02-10 DIAGNOSIS — H04123 Dry eye syndrome of bilateral lacrimal glands: Secondary | ICD-10-CM | POA: Diagnosis not present

## 2016-02-10 DIAGNOSIS — H26492 Other secondary cataract, left eye: Secondary | ICD-10-CM | POA: Diagnosis not present

## 2016-02-10 DIAGNOSIS — H1013 Acute atopic conjunctivitis, bilateral: Secondary | ICD-10-CM | POA: Diagnosis not present

## 2016-02-12 DIAGNOSIS — G3184 Mild cognitive impairment, so stated: Secondary | ICD-10-CM | POA: Diagnosis not present

## 2016-02-12 DIAGNOSIS — R1907 Generalized intra-abdominal and pelvic swelling, mass and lump: Secondary | ICD-10-CM | POA: Diagnosis not present

## 2016-02-12 DIAGNOSIS — R0600 Dyspnea, unspecified: Secondary | ICD-10-CM | POA: Diagnosis not present

## 2016-02-12 DIAGNOSIS — M79669 Pain in unspecified lower leg: Secondary | ICD-10-CM | POA: Diagnosis not present

## 2016-02-12 DIAGNOSIS — E782 Mixed hyperlipidemia: Secondary | ICD-10-CM | POA: Diagnosis not present

## 2016-02-20 ENCOUNTER — Ambulatory Visit
Admission: RE | Admit: 2016-02-20 | Discharge: 2016-02-20 | Disposition: A | Discharge status: Home / Self Care | Payer: Medicaid Other | Source: Ambulatory Visit | Attending: Internal Medicine | Admitting: Internal Medicine

## 2016-02-20 ENCOUNTER — Other Ambulatory Visit: Payer: Self-pay | Admitting: Nurse Practitioner

## 2016-02-20 DIAGNOSIS — R140 Abdominal distension (gaseous): Secondary | ICD-10-CM

## 2016-03-12 ENCOUNTER — Telehealth: Payer: Self-pay | Admitting: Internal Medicine

## 2016-03-12 NOTE — Telephone Encounter (Signed)
S/w pt, advised 7/13 appt chgd to 7/25 due to md pal. Md has cx'd pal req. Pt states she would like to see md on 7/13. Moved 7/25 appt back to 7/13 '@11'$ .15. Pt verbalized understanding.

## 2016-03-16 ENCOUNTER — Other Ambulatory Visit (HOSPITAL_BASED_OUTPATIENT_CLINIC_OR_DEPARTMENT_OTHER): Payer: Commercial Managed Care - HMO

## 2016-03-16 ENCOUNTER — Ambulatory Visit: Payer: Commercial Managed Care - HMO | Admitting: Thoracic Surgery (Cardiothoracic Vascular Surgery)

## 2016-03-16 ENCOUNTER — Ambulatory Visit (HOSPITAL_COMMUNITY)
Admission: RE | Admit: 2016-03-16 | Discharge: 2016-03-16 | Disposition: A | Discharge status: Home / Self Care | Payer: Commercial Managed Care - HMO | Source: Ambulatory Visit | Attending: Internal Medicine | Admitting: Internal Medicine

## 2016-03-16 ENCOUNTER — Encounter (HOSPITAL_COMMUNITY): Payer: Self-pay

## 2016-03-16 DIAGNOSIS — C3492 Malignant neoplasm of unspecified part of left bronchus or lung: Secondary | ICD-10-CM | POA: Diagnosis not present

## 2016-03-16 DIAGNOSIS — C3412 Malignant neoplasm of upper lobe, left bronchus or lung: Secondary | ICD-10-CM | POA: Diagnosis not present

## 2016-03-16 DIAGNOSIS — I2510 Atherosclerotic heart disease of native coronary artery without angina pectoris: Secondary | ICD-10-CM | POA: Diagnosis not present

## 2016-03-16 DIAGNOSIS — Z85118 Personal history of other malignant neoplasm of bronchus and lung: Secondary | ICD-10-CM | POA: Diagnosis not present

## 2016-03-16 DIAGNOSIS — I700 Atherosclerosis of aorta: Secondary | ICD-10-CM | POA: Diagnosis not present

## 2016-03-16 DIAGNOSIS — J432 Centrilobular emphysema: Secondary | ICD-10-CM | POA: Insufficient documentation

## 2016-03-16 DIAGNOSIS — J984 Other disorders of lung: Secondary | ICD-10-CM | POA: Diagnosis not present

## 2016-03-16 DIAGNOSIS — Z902 Acquired absence of lung [part of]: Secondary | ICD-10-CM | POA: Diagnosis not present

## 2016-03-16 DIAGNOSIS — R918 Other nonspecific abnormal finding of lung field: Secondary | ICD-10-CM | POA: Insufficient documentation

## 2016-03-16 LAB — CBC WITH DIFFERENTIAL/PLATELET
BASO%: 0.6 % (ref 0.0–2.0)
Basophils Absolute: 0 10*3/uL (ref 0.0–0.1)
EOS ABS: 0.3 10*3/uL (ref 0.0–0.5)
EOS%: 4.4 % (ref 0.0–7.0)
HEMATOCRIT: 42.5 % (ref 34.8–46.6)
HGB: 13.7 g/dL (ref 11.6–15.9)
LYMPH#: 3.3 10*3/uL (ref 0.9–3.3)
LYMPH%: 53.1 % — AB (ref 14.0–49.7)
MCH: 30 pg (ref 25.1–34.0)
MCHC: 32.2 g/dL (ref 31.5–36.0)
MCV: 93.2 fL (ref 79.5–101.0)
MONO#: 0.6 10*3/uL (ref 0.1–0.9)
MONO%: 9.8 % (ref 0.0–14.0)
NEUT#: 2 10*3/uL (ref 1.5–6.5)
NEUT%: 32.1 % — AB (ref 38.4–76.8)
PLATELETS: 236 10*3/uL (ref 145–400)
RBC: 4.56 10*6/uL (ref 3.70–5.45)
RDW: 15.6 % — ABNORMAL HIGH (ref 11.2–14.5)
WBC: 6.2 10*3/uL (ref 3.9–10.3)

## 2016-03-16 LAB — COMPREHENSIVE METABOLIC PANEL
ALBUMIN: 3.9 g/dL (ref 3.5–5.0)
ALK PHOS: 112 U/L (ref 40–150)
ALT: 21 U/L (ref 0–55)
AST: 31 U/L (ref 5–34)
Anion Gap: 11 mEq/L (ref 3–11)
BILIRUBIN TOTAL: 0.75 mg/dL (ref 0.20–1.20)
BUN: 15.8 mg/dL (ref 7.0–26.0)
CALCIUM: 9.3 mg/dL (ref 8.4–10.4)
CO2: 25 mEq/L (ref 22–29)
CREATININE: 1.1 mg/dL (ref 0.6–1.1)
Chloride: 105 mEq/L (ref 98–109)
EGFR: 58 mL/min/{1.73_m2} — ABNORMAL LOW (ref >90)
GLUCOSE: 131 mg/dL (ref 70–140)
POTASSIUM: 3.5 meq/L (ref 3.5–5.1)
SODIUM: 142 meq/L (ref 136–145)
TOTAL PROTEIN: 7.6 g/dL (ref 6.4–8.3)

## 2016-03-16 MED ORDER — IOPAMIDOL (ISOVUE-300) INJECTION 61%
75.0000 mL | Freq: Once | INTRAVENOUS | Status: AC | PRN
  Administered 2016-03-16: 75 mL via INTRAVENOUS

## 2016-03-18 ENCOUNTER — Ambulatory Visit: Payer: Commercial Managed Care - HMO | Admitting: Internal Medicine

## 2016-03-18 ENCOUNTER — Ambulatory Visit (HOSPITAL_BASED_OUTPATIENT_CLINIC_OR_DEPARTMENT_OTHER): Payer: Commercial Managed Care - HMO | Admitting: Internal Medicine

## 2016-03-18 ENCOUNTER — Encounter: Payer: Self-pay | Admitting: Internal Medicine

## 2016-03-18 VITALS — BP 130/72 | HR 79 | Temp 97.8°F | Resp 18 | Ht 63.0 in | Wt 141.9 lb

## 2016-03-18 DIAGNOSIS — R05 Cough: Secondary | ICD-10-CM | POA: Diagnosis not present

## 2016-03-18 DIAGNOSIS — C3492 Malignant neoplasm of unspecified part of left bronchus or lung: Secondary | ICD-10-CM

## 2016-03-18 DIAGNOSIS — C3412 Malignant neoplasm of upper lobe, left bronchus or lung: Secondary | ICD-10-CM

## 2016-03-18 NOTE — Progress Notes (Signed)
[place] Telephone:([phone]) [phone]   Fax:([phone]) [place], MD [address] [place] [address]  DIAGNOSIS: Stage IB (T2a, N0, M0) non-small cell lung cancer, adenocarcinoma presented with left upper lobe nodule diagnosed in September 2016.  PRIOR THERAPY: Status post left VATS with left upper lobectomy and mediastinal lymph node dissection on 07/04/2015 under the care of Dr. Roxan Hockey.  CURRENT THERAPY: Observation.  INTERVAL HISTORY: [name] 75 y.o. female returns to the clinic today for six-month follow-up visit. The patient is feeling fine today with no specific complaints except for dry cough and occasional pain on the left side of the chest at the surgical scar. She denied having any significant shortness breath except with exertion. She denied having any hemoptysis. She denied having any significant weight loss or night sweats. She has no nausea or vomiting. She had repeat CT scan of the chest performed recently and she is here for evaluation and discussion of her scan results.  MEDICAL HISTORY: Past Medical History  Diagnosis Date  . Hyperlipidemia   . Asthma   . COPD (chronic obstructive pulmonary disease) ([place])   . Shortness of breath   . GERD (gastroesophageal reflux disease)   . Thyroid nodule   . Depression   . Arthritis     ALLERGIES:  is allergic to albuterol.  MEDICATIONS:  Current Outpatient Prescriptions  Medication Sig Dispense Refill  . albuterol (PROVENTIL HFA;VENTOLIN HFA) 108 (90 BASE) MCG/ACT inhaler Inhale 2 puffs into the lungs every 6 (six) hours as needed for wheezing or shortness of breath. 1 Inhaler 11  . aspirin 81 MG tablet Take 81 mg by mouth every evening.     . donepezil (ARICEPT) 5 MG tablet Take 5 mg by mouth every evening.  0  . ezetimibe (ZETIA) 10 MG tablet Take 10 mg by mouth at bedtime.     . fluorometholone (FML) 0.1 % ophthalmic suspension Place 1  drop into both eyes daily.  1  . Melatonin 2.5 MG CAPS Take 1 capsule by mouth at bedtime as needed.    . mirtazapine (REMERON) 15 MG tablet Take 7.5 mg by mouth at bedtime.   0  . niacin (NIASPAN) 500 MG CR tablet Take 500 mg by mouth every evening.     Marland Kitchen OVER THE COUNTER MEDICATION Take 1 capsule by mouth daily as needed.    . pantoprazole (PROTONIX) 40 MG tablet Take 40 mg by mouth 2 (two) times daily.    . RESTASIS 0.05 % ophthalmic emulsion Place 1 drop into both eyes.  4  . SPIRIVA RESPIMAT 2.5 MCG/ACT AERS INHALE 2 PUFFS INTO THE LUNGS DAILY. 1 Inhaler 3  . traMADol (ULTRAM) 50 MG tablet Take 1-2 tablets (50-100 mg total) by mouth every 6 (six) hours as needed. 40 tablet 0  . valACYclovir (VALTREX) 500 MG tablet Take 500 mg by mouth daily.    Marland Kitchen oxyCODONE (OXY IR/ROXICODONE) 5 MG immediate release tablet Take 1 tablet (5 mg total) by mouth every 6 (six) hours as needed for severe pain. (Patient not taking: Reported on 11/24/2015) 40 tablet 0  . White Petrolatum-Mineral Oil (SYSTANE NIGHTTIME OP) Apply 1 drop to eye at bedtime as needed.     No current facility-administered medications for this visit.    SURGICAL HISTORY:  Past Surgical History  Procedure Laterality Date  . Cesarean section    . Cataract extraction      left  eye  . Microlaryngoscopy N/A 12/13/2014    Procedure: MICROLARYNGOSCOPY;  Surgeon: Jerrell Belfast, MD;  Location: [facility];  Service: ENT;  Laterality: N/A;  . Mass excision N/A 12/13/2014    Procedure: EXCISION OF VOCAL CORD POLYPS;  Surgeon: Jerrell Belfast, MD;  Location: [facility];  Service: ENT;  Laterality: N/A;  . Biopsy thyroid    . Cataract extraction w/ intraocular lens implant      RIGHT  . Video assisted thoracoscopy (vats)/ lobectomy Left 07/04/2015    Procedure: VIDEO ASSISTED THORACOSCOPY (VATS) LEFT UPPER LOBECTOMY;  Surgeon: Melrose Nakayama, MD;  Location: [facility];  Service: Thoracic;  Laterality: Left;  . Video bronchoscopy with insertion of  interbronchial valve (ibv) N/A 07/11/2015    Procedure: VIDEO BRONCHOSCOPY WITH INSERTION OF INTERBRONCHIAL VALVE (IBV);  Surgeon: Melrose Nakayama, MD;  Location: [facility];  Service: Thoracic;  Laterality: N/A;  . Eye surgery    . Tubal ligation    . Video bronchoscopy with insertion of interbronchial valve (ibv) N/A 09/10/2015    Procedure: VIDEO BRONCHOSCOPY WITH REMOVAL OF INTERBRONCHIAL VALVES;  Surgeon: Melrose Nakayama, MD;  Location: [facility];  Service: Thoracic;  Laterality: N/A;    REVIEW OF SYSTEMS:  A comprehensive review of systems was negative except for: Constitutional: positive for fatigue Respiratory: positive for cough, dyspnea on exertion and pleurisy/chest pain   PHYSICAL EXAMINATION: General appearance: alert, cooperative and no distress Head: Normocephalic, without obvious abnormality, atraumatic Neck: no adenopathy, no JVD, supple, symmetrical, trachea midline and thyroid not enlarged, symmetric, no tenderness/mass/nodules Lymph nodes: Cervical, supraclavicular, and axillary nodes normal. Resp: clear to auscultation bilaterally Back: symmetric, no curvature. ROM normal. No CVA tenderness. Cardio: regular rate and rhythm, S1, S2 normal, no murmur, click, rub or gallop GI: soft, non-tender; bowel sounds normal; no masses,  no organomegaly Extremities: extremities normal, atraumatic, no cyanosis or edema  ECOG PERFORMANCE STATUS: 1 - Symptomatic but completely ambulatory  Blood pressure 130/72, pulse 79, temperature 97.8 F (36.6 C), temperature source Oral, resp. rate 18, height '5\' 3"'$  (1.6 m), weight 141 lb 14.4 oz (64.365 kg), SpO2 97 %.  LABORATORY DATA: Lab Results  Component Value Date   WBC 6.2 03/16/2016   HGB 13.7 03/16/2016   HCT 42.5 03/16/2016   MCV 93.2 03/16/2016   PLT 236 03/16/2016      Chemistry      Component Value Date/Time   NA 142 03/16/2016 0941   NA 143 09/03/2015 1214   K 3.5 03/16/2016 0941   K 3.7 09/03/2015 1214   CL 107  09/03/2015 1214   CO2 25 03/16/2016 0941   CO2 29 09/03/2015 1214   BUN 15.8 03/16/2016 0941   BUN 8 09/03/2015 1214   CREATININE 1.1 03/16/2016 0941   CREATININE 0.95 09/03/2015 1214      Component Value Date/Time   CALCIUM 9.3 03/16/2016 0941   CALCIUM 9.5 09/03/2015 1214   ALKPHOS 112 03/16/2016 0941   ALKPHOS 79 09/03/2015 1214   AST 31 03/16/2016 0941   AST 25 09/03/2015 1214   ALT 21 03/16/2016 0941   ALT 17 09/03/2015 1214   BILITOT 0.75 03/16/2016 0941   BILITOT 1.2 09/03/2015 1214       RADIOGRAPHIC STUDIES: Dg Abd 1 View  02/20/2016  CLINICAL DATA:  Abdominal distension and early satiety for several weeks. EXAM: ABDOMEN - 1 VIEW COMPARISON:  06/02/2015 PET-CT. FINDINGS: No disproportionately dilated small bowel loops. Mild to moderate colorectal stool volume. No evidence of pneumatosis, pneumoperitoneum  or pathologic soft tissue calcification. Mild degenerative changes in the lower lumbar spine. IMPRESSION: Nonobstructive bowel gas pattern. Mild-to-moderate colorectal stool volume. Electronically Signed   By: Ilona Sorrel M.D.   On: 02/20/2016 13:45   Ct Chest W Contrast  03/16/2016  CLINICAL DATA:  75 year old female with history of lung cancer diagnosed in October 2016, status post left upper lobectomy. Increased chronic cough since time of surgery. Follow-up study. EXAM: CT CHEST WITH CONTRAST TECHNIQUE: Multidetector CT imaging of the chest was performed during intravenous contrast administration. CONTRAST:  69m ISOVUE-300 IOPAMIDOL (ISOVUE-300) INJECTION 61% COMPARISON:  Multiple priors, most recently chest CT 09/24/2015. FINDINGS: Cardiovascular: Heart size is normal. There is no significant pericardial fluid, thickening or pericardial calcification. There is aortic atherosclerosis, as well as atherosclerosis of the great vessels of the mediastinum and the coronary arteries, including calcified atherosclerotic plaque in the left anterior descending and right coronary  arteries. Mediastinum/Nodes: No pathologically enlarged mediastinal or hilar lymph nodes. Heterogeneously enlarged thyroid gland (right greater than left) with a dominant nodule in the right lobe of the gland which is noncalcified and measures 2.5 x 1.6 cm (unchanged). Esophagus is unremarkable in appearance. No axillary lymphadenopathy. Lungs/Pleura: Status post left upper lobectomy. Compensatory hyperexpansion of the left lower lobe. There continues to be a nodular area of apparent scarring in the inferior aspect of the left lower lobe, which has become slightly more apparent than the prior examination and demonstrates slightly more convex margins, measuring 1.7 x 1.1 cm on today's study (image 37 of series 2), which is only slightly larger than the prior examination. This nodular area does appear to enhance, measuring up to 113 HU, which is nonspecific. No other suspicious appearing pulmonary nodules or masses are noted. There is no acute consolidative airspace disease. No pleural effusions. Diffuse bronchial wall thickening with mild to moderate centrilobular emphysema. Upper Abdomen: Aortic atherosclerosis. Musculoskeletal: There are no aggressive appearing lytic or blastic lesions noted in the visualized portions of the skeleton. IMPRESSION: 1. Postoperative changes of left upper lobectomy redemonstrated, with persistent area of what appears to be nodular scarring in the inferior aspect of the left lower lobe. However, there is slightly larger than the prior examination, demonstrates slightly convex margins, and appears to enhance. The possibility of a 2nd primary neoplasm or metastatic focus is difficult to entirely exclude, and accordingly, further evaluation with PET-CT is recommended in the near future. 2. Aortic atherosclerosis, in addition to 2 vessel coronary artery disease. Assessment for potential risk factor modification, dietary therapy or pharmacologic therapy may be warranted, if clinically  indicated. 3. Diffuse bronchial wall thickening with mild to moderate centrilobular emphysema; imaging findings suggestive of underlying COPD. 4. Additional incidental findings, similar prior studies, as above. These results will be called to the ordering clinician or representative by the Radiologist Assistant, and communication documented in the PACS or zVision Dashboard. Electronically Signed   By: DVinnie LangtonM.D.   On: 03/16/2016 14:52    ASSESSMENT AND PLAN: This is a very pleasant 75years old African-American female with a stage IB non-small cell lung cancer status post right upper lobectomy with lymph node dissection and currently on observation. The recent CT scan of the chest showed no clear evidence for disease recurrence but there was some postoperative nodular scarring in the inferior aspect of the left lower lobe suspicious for remodeling of scarring versus disease recurrence. I discussed the scan results with the patient today. I recommended for her to have repeat CT scan of the  chest in 3 months for further evaluation of this lesion. If this area continues to be suspicious on the upcoming scan, I would consider the patient for a PET scan and biopsy if needed. She was advised to call immediately if she has any concerning symptoms in the interval. The patient voices understanding of current disease status and treatment options and is in agreement with the current care plan.  All questions were answered. The patient knows to call the clinic with any problems, questions or concerns. We can certainly see the patient much sooner if necessary.  Disclaimer: This note was dictated with voice recognition software. Similar sounding words can inadvertently be transcribed and may not be corrected upon review.

## 2016-03-23 ENCOUNTER — Ambulatory Visit (INDEPENDENT_AMBULATORY_CARE_PROVIDER_SITE_OTHER): Payer: Commercial Managed Care - HMO | Admitting: Thoracic Surgery (Cardiothoracic Vascular Surgery)

## 2016-03-23 ENCOUNTER — Encounter: Payer: Self-pay | Admitting: Thoracic Surgery (Cardiothoracic Vascular Surgery)

## 2016-03-23 VITALS — BP 167/86 | HR 69 | Resp 16 | Ht 63.0 in | Wt 141.0 lb

## 2016-03-23 DIAGNOSIS — C3490 Malignant neoplasm of unspecified part of unspecified bronchus or lung: Secondary | ICD-10-CM | POA: Diagnosis not present

## 2016-03-23 DIAGNOSIS — Z902 Acquired absence of lung [part of]: Secondary | ICD-10-CM | POA: Diagnosis not present

## 2016-03-23 NOTE — Progress Notes (Signed)
[address]       [place] [address]             [phone]       HPI: [name] returns today for a scheduled follow-up visit.  She is a 75 year old woman who had a thoracoscopic left upper lobectomy on 07/04/2015 for a stage IB (T2a, N0) adenocarcinoma. Her postoperative course was complicated by a prolonged air leak. She initially did not have an air leak and her chest tubes were removed on day 3. She had to have a chest tube replaced the following day after she developed severe subcutaneous emphysema. She had a large air leak that persisted. Eventually I placed endobronchial valves on 07/11/2015. After that her air leak resolved and her chest tube was removed. She was finally discharged home on 07/22/2015.  I remove the endobronchial valves on September 10, 2015. She did not have any problems with that procedure.  I last saw in the office in January. She was doing well at that time. She saw Dr. Julien Nordmann and did not require any adjuvant chemotherapy. She recently had a 6 month follow-up CT scan. She does complain of a frequent cough which is occasionally productive. She has not had any hemoptysis. She also has some left chest discomfort particularly when she lies down at night, she is not taking any medication for that.  Past Medical History  Diagnosis Date  . Hyperlipidemia   . Asthma   . COPD (chronic obstructive pulmonary disease) ([place])   . Shortness of breath   . GERD (gastroesophageal reflux disease)   . Thyroid nodule   . Depression   . Arthritis       Current Outpatient Prescriptions  Medication Sig Dispense Refill  . albuterol (PROVENTIL HFA;VENTOLIN HFA) 108 (90 BASE) MCG/ACT inhaler Inhale 2 puffs into the lungs every 6 (six) hours as needed for wheezing or shortness of breath. 1 Inhaler 11  . aspirin 81 MG tablet Take 81 mg by mouth every evening.     . donepezil (ARICEPT) 5 MG tablet Take 5 mg by mouth every evening.  0  . ezetimibe (ZETIA)  10 MG tablet Take 10 mg by mouth at bedtime.     . fluorometholone (FML) 0.1 % ophthalmic suspension Place 1 drop into both eyes daily.  1  . Melatonin 2.5 MG CAPS Take 1 capsule by mouth at bedtime as needed.    . mirtazapine (REMERON) 15 MG tablet Take 7.5 mg by mouth at bedtime.   0  . niacin (NIASPAN) 500 MG CR tablet Take 500 mg by mouth every evening.     Marland Kitchen OVER THE COUNTER MEDICATION Take 1 capsule by mouth daily as needed.    . pantoprazole (PROTONIX) 40 MG tablet Take 40 mg by mouth 2 (two) times daily.    . RESTASIS 0.05 % ophthalmic emulsion Place 1 drop into both eyes.  4  . SPIRIVA RESPIMAT 2.5 MCG/ACT AERS INHALE 2 PUFFS INTO THE LUNGS DAILY. 1 Inhaler 3  . traMADol (ULTRAM) 50 MG tablet Take 1-2 tablets (50-100 mg total) by mouth every 6 (six) hours as needed. 40 tablet 0  . valACYclovir (VALTREX) 500 MG tablet Take 500 mg by mouth daily.    Dema Severin Petrolatum-Mineral Oil (SYSTANE NIGHTTIME OP) Apply 1 drop to eye at bedtime as needed.     No current facility-administered medications for this visit.    Physical Exam BP 167/86 mmHg  Pulse 69  Resp  16  Ht '5\' 3"'$  (1.6 m)  Wt 141 lb (63.957 kg)  BMI 24.98 kg/m2  SpO27 44% 75 year old woman in no acute distress Alert and oriented 3 with no focal deficits No cervical or supra clavicular adenopathy Incisions well healed Lungs diminished at left base, otherwise clear Cardiac regular rate and rhythm normal S1 and S2  Diagnostic Tests: CT CHEST WITH CONTRAST  TECHNIQUE: Multidetector CT imaging of the chest was performed during intravenous contrast administration.  CONTRAST: 22m ISOVUE-300 IOPAMIDOL (ISOVUE-300) INJECTION 61%  COMPARISON: Multiple priors, most recently chest CT 09/24/2015.  FINDINGS: Cardiovascular: Heart size is normal. There is no significant pericardial fluid, thickening or pericardial calcification. There is aortic atherosclerosis, as well as atherosclerosis of the great vessels of the  mediastinum and the coronary arteries, including calcified atherosclerotic plaque in the left anterior descending and right coronary arteries.  Mediastinum/Nodes: No pathologically enlarged mediastinal or hilar lymph nodes. Heterogeneously enlarged thyroid gland (right greater than left) with a dominant nodule in the right lobe of the gland which is noncalcified and measures 2.5 x 1.6 cm (unchanged). Esophagus is unremarkable in appearance. No axillary lymphadenopathy.  Lungs/Pleura: Status post left upper lobectomy. Compensatory hyperexpansion of the left lower lobe. There continues to be a nodular area of apparent scarring in the inferior aspect of the left lower lobe, which has become slightly more apparent than the prior examination and demonstrates slightly more convex margins, measuring 1.7 x 1.1 cm on today's study (image 37 of series 2), which is only slightly larger than the prior examination. This nodular area does appear to enhance, measuring up to 113 HU, which is nonspecific. No other suspicious appearing pulmonary nodules or masses are noted. There is no acute consolidative airspace disease. No pleural effusions. Diffuse bronchial wall thickening with mild to moderate centrilobular emphysema.  Upper Abdomen: Aortic atherosclerosis.  Musculoskeletal: There are no aggressive appearing lytic or blastic lesions noted in the visualized portions of the skeleton.  IMPRESSION: 1. Postoperative changes of left upper lobectomy redemonstrated, with persistent area of what appears to be nodular scarring in the inferior aspect of the left lower lobe. However, there is slightly larger than the prior examination, demonstrates slightly convex margins, and appears to enhance. The possibility of a 2nd primary neoplasm or metastatic focus is difficult to entirely exclude, and accordingly, further evaluation with PET-CT is recommended in the near future. 2. Aortic  atherosclerosis, in addition to 2 vessel coronary artery disease. Assessment for potential risk factor modification, dietary therapy or pharmacologic therapy may be warranted, if clinically indicated. 3. Diffuse bronchial wall thickening with mild to moderate centrilobular emphysema; imaging findings suggestive of underlying COPD. 4. Additional incidental findings, similar prior studies, as above. These results will be called to the ordering clinician or representative by the Radiologist Assistant, and communication documented in the PACS or zVision Dashboard.   Electronically Signed  By: DVinnie LangtonM.D.  On: 03/16/2016 14:52  I reviewed the CT chest and current findings as noted above.  Impression: 75year old woman with a stage IB non-small cell carcinoma status post left upper lobectomy in October 2016. She is now about 9 months out from surgery. Her initial postoperative course was complicated by an air leak which required bronchial valves placement. Those were removed in January.  Her recent CT shows postoperative changes. There does appear to be some scarring in the left lower lobe. I doubt that this represents recurrence or second primary. I agree with Dr. MWorthy Flankplan to rescan her in 3 months.  She did have incidentally noted aortic atherosclerosis and coronary atherosclerosis. She is on Zetia and niacin. She can discuss with her primary whether or not to consider a statin.  Hypertension- her blood pressure was elevated today. At her visit with Dr. Julien Nordmann a week ago her blood pressure was within normal limits. I recommended that she follow-up with her primary to make sure her blood pressure does not stay elevated.  Plan: I will plan to see her back in 3 months after her CT chest.  Melrose Nakayama, MD [facility] [phone]

## 2016-03-29 ENCOUNTER — Ambulatory Visit (INDEPENDENT_AMBULATORY_CARE_PROVIDER_SITE_OTHER): Payer: Commercial Managed Care - HMO | Admitting: Pulmonary Disease

## 2016-03-29 ENCOUNTER — Encounter: Payer: Self-pay | Admitting: Pulmonary Disease

## 2016-03-29 DIAGNOSIS — C3492 Malignant neoplasm of unspecified part of left bronchus or lung: Secondary | ICD-10-CM

## 2016-03-29 DIAGNOSIS — J449 Chronic obstructive pulmonary disease, unspecified: Secondary | ICD-10-CM | POA: Diagnosis not present

## 2016-03-29 NOTE — Assessment & Plan Note (Signed)
Sample of [place] instead of Spiriva Referral to pulmonary rehabilitation  -Since she appears to be deconditioned postoperatively

## 2016-03-29 NOTE — Assessment & Plan Note (Signed)
Appears to be postoperative scarring CT scan in October

## 2016-03-29 NOTE — Patient Instructions (Signed)
Sample of [place] instead of Spiriva Referral to pulmonary rehabilitation CT scan in October

## 2016-03-29 NOTE — Progress Notes (Signed)
   Subjective:    Patient ID: [name], female    DOB: 05/05/1941, 75 y.o.   MRN: [id number]  HPI  75 year old  smoker for FU of gold B COPD.  She started smoking as a teenager and smoked about 60 pack years. She worked in a [employer] for 20 years until she retired in 58.   In 06/2015 She underwent left VATS with left upper lobectomy and mediastinal lymph node dissection. Path  showed invasive adenocarcinoma,  adenocarcinoma involving the visceral pleura in addition to lymphovascular invasion. The resection margins as well as the dissected lymph nodes were negative for malignancy. She had persistent air leak postop and required endobronchial valves for resolution.  03/29/2016  Chief Complaint  Patient presents with  . Follow-up    breathing has been same, no change.  cough, metallic taste in mouth.     Breathing is stable but she feels fatigued easily CT chest from 03/2016 was reviewed which shows nodular scarring in the left lower lobe-and repeat scan is planned in October she reports white mucus production and metallic taste in her mouth with Spiriva   Significant tests/ events  Pre-op PFT - FEV1 73 %, DLCO 48%  03/2016 FEV1 66%   Review of Systems Patient denies significant dyspnea,cough, hemoptysis,  chest pain, palpitations, pedal edema, orthopnea, paroxysmal nocturnal dyspnea, lightheadedness, nausea, vomiting, abdominal or  leg pains      Objective:   Physical Exam   Gen. Pleasant, well-nourished, in no distress ENT - no lesions, no post nasal drip Neck: No JVD, no thyromegaly, no carotid bruits Lungs: no use of accessory muscles, no dullness to percussion, Decreased without rales or rhonchi  Cardiovascular: Rhythm regular, heart sounds  normal, no murmurs or gallops, no peripheral edema Musculoskeletal: No deformities, no cyanosis or clubbing         Assessment & Plan:

## 2016-03-30 ENCOUNTER — Ambulatory Visit: Payer: Medicaid Other | Admitting: Internal Medicine

## 2016-04-12 ENCOUNTER — Telehealth: Payer: Self-pay | Admitting: Pulmonary Disease

## 2016-04-12 MED ORDER — TIOTROPIUM BROMIDE-OLODATEROL 2.5-2.5 MCG/ACT IN AERS: 2.0000 | INHALATION_SPRAY | Freq: Every day | RESPIRATORY_TRACT | 5 refills | Status: DC

## 2016-04-12 NOTE — Telephone Encounter (Signed)
Called and spoke with pt and she stated that she would like a refill of the stiolto inhaler.  She stated that she has been using this instead of the spiriva.  Refill has been sent to the pharmacy.  Nothing further is needed.

## 2016-05-13 DIAGNOSIS — H04123 Dry eye syndrome of bilateral lacrimal glands: Secondary | ICD-10-CM | POA: Diagnosis not present

## 2016-05-13 DIAGNOSIS — H53002 Unspecified amblyopia, left eye: Secondary | ICD-10-CM | POA: Diagnosis not present

## 2016-05-13 DIAGNOSIS — H1013 Acute atopic conjunctivitis, bilateral: Secondary | ICD-10-CM | POA: Diagnosis not present

## 2016-05-13 DIAGNOSIS — H01003 Unspecified blepharitis right eye, unspecified eyelid: Secondary | ICD-10-CM | POA: Diagnosis not present

## 2016-06-02 DIAGNOSIS — Z1231 Encounter for screening mammogram for malignant neoplasm of breast: Secondary | ICD-10-CM | POA: Diagnosis not present

## 2016-06-02 DIAGNOSIS — Z0000 Encounter for general adult medical examination without abnormal findings: Secondary | ICD-10-CM | POA: Diagnosis not present

## 2016-06-02 DIAGNOSIS — R413 Other amnesia: Secondary | ICD-10-CM | POA: Diagnosis not present

## 2016-06-02 DIAGNOSIS — M545 Low back pain: Secondary | ICD-10-CM | POA: Diagnosis not present

## 2016-06-02 DIAGNOSIS — E782 Mixed hyperlipidemia: Secondary | ICD-10-CM | POA: Diagnosis not present

## 2016-06-02 DIAGNOSIS — C3490 Malignant neoplasm of unspecified part of unspecified bronchus or lung: Secondary | ICD-10-CM | POA: Diagnosis not present

## 2016-06-02 DIAGNOSIS — Z23 Encounter for immunization: Secondary | ICD-10-CM | POA: Diagnosis not present

## 2016-06-02 DIAGNOSIS — K5900 Constipation, unspecified: Secondary | ICD-10-CM | POA: Diagnosis not present

## 2016-06-04 ENCOUNTER — Encounter (HOSPITAL_COMMUNITY)
Admission: RE | Admit: 2016-06-04 | Discharge: 2016-06-04 | Disposition: A | Discharge status: Home / Self Care | Payer: Commercial Managed Care - HMO | Source: Ambulatory Visit | Attending: Pulmonary Disease | Admitting: Pulmonary Disease

## 2016-06-04 ENCOUNTER — Encounter (HOSPITAL_COMMUNITY): Payer: Self-pay

## 2016-06-04 VITALS — BP 161/74 | HR 66 | Resp 18 | Ht 63.0 in | Wt 140.4 lb

## 2016-06-04 DIAGNOSIS — C3492 Malignant neoplasm of unspecified part of left bronchus or lung: Secondary | ICD-10-CM

## 2016-06-04 NOTE — Progress Notes (Signed)
[name] 75 y.o. female Pulmonary Rehab Orientation Note Patient arrived today in Cardiac and Pulmonary Rehab for orientation to Pulmonary Rehab. She was transported from [employer] via wheel chair. She has not been prescribed oxygen for home or portable use. Color good, skin warm and dry. Patient is oriented to time and place. Patient's medical history, psychosocial health, and medications reviewed. Psychosocial assessment reveals pt lives alone. Her niece and nephew live with her but her nephew is an Occupational psychologist and travels weekly. Her husband passed away in 11-29-2007. Pt is currently retired. Pt hobbies include gardening, reading, watching TV, and her daily walking. Pt reports her stress level is low. Pt does not exhibit signs of depression. PHQ2/9 score 0/na. Pt shows good  coping skills with positive outlook. She was offered emotional support and reassurance. Will continue to monitor and evaluate progress toward psychosocial goal of remaining positive about her future health. Physical assessment reveals heart rate is normal. Breath sounds course with rhonchi left lower, clear left upper, clear right. Grip strength equal, strong. Distal pulses palpable. 2 plus pitting edema noted to ankles and lower legs. Patient reports she does take medications as prescribed. Patient states she follows a Regular diet. The patient reports no specific efforts to gain or lose weight.. Patient's weight will be monitored closely. Demonstration and practice of PLB using pulse oximeter. Patient able to return demonstration satisfactorily. Safety and hand hygiene in the exercise area reviewed with patient. Patient voices understanding of the information reviewed. Department expectations discussed with patient and achievable goals were set. The patient shows enthusiasm about attending the program and we look forward to working with this nice lady. The patient is scheduled for a 6 min walk test on 06/08/16 and  to begin exercise on 06/15/16.   45 minutes was spent on a variety of activities such as assessment of the patient, obtaining baseline data including height, weight, BMI, and grip strength, verifying medical history, allergies, and current medications, and teaching patient strategies for performing tasks with less respiratory effort with emphasis on pursed lip breathing.

## 2016-06-08 ENCOUNTER — Encounter (HOSPITAL_COMMUNITY)
Admission: RE | Admit: 2016-06-08 | Discharge: 2016-06-08 | Disposition: A | Discharge status: Home / Self Care | Payer: Commercial Managed Care - HMO | Source: Ambulatory Visit | Attending: Pulmonary Disease | Admitting: Pulmonary Disease

## 2016-06-08 DIAGNOSIS — J449 Chronic obstructive pulmonary disease, unspecified: Secondary | ICD-10-CM

## 2016-06-08 NOTE — Progress Notes (Signed)
Pulmonary Individual Treatment Plan  Patient Details  Name: [name] MRN: [id number] Date of Birth: 1940/12/14 Referring Provider:   April Manson Pulmonary Rehab Walk Test from 06/08/2016 in [facility]  Referring Provider  Dr. Elsworth Soho      Initial Encounter Date:  Flowsheet Row Pulmonary Rehab Walk Test from 06/08/2016 in [facility]  Date  06/08/16  Referring Provider  Dr. Elsworth Soho      Visit Diagnosis: Chronic obstructive pulmonary disease, unspecified COPD type ([place])  Patient's Home Medications on Admission:   Current Outpatient Prescriptions:  .  albuterol (PROVENTIL HFA;VENTOLIN HFA) 108 (90 BASE) MCG/ACT inhaler, Inhale 2 puffs into the lungs every 6 (six) hours as needed for wheezing or shortness of breath., Disp: 1 Inhaler, Rfl: 11 .  aspirin 81 MG tablet, Take 81 mg by mouth every evening. , Disp: , Rfl:  .  donepezil (ARICEPT) 10 MG tablet, Take 10 mg by mouth every evening., Disp: , Rfl: 2 .  ezetimibe (ZETIA) 10 MG tablet, Take 10 mg by mouth at bedtime. , Disp: , Rfl:  .  fluorometholone (FML) 0.1 % ophthalmic suspension, Place 1 drop into both eyes daily., Disp: , Rfl: 1 .  Melatonin 2.5 MG CAPS, Take 1 capsule by mouth at bedtime as needed., Disp: , Rfl:  .  mirtazapine (REMERON) 15 MG tablet, Take 7.5 mg by mouth at bedtime. , Disp: , Rfl: 0 .  niacin (NIASPAN) 500 MG CR tablet, Take 500 mg by mouth every evening. , Disp: , Rfl:  .  OVER THE COUNTER MEDICATION, Take 1 capsule by mouth daily as needed., Disp: , Rfl:  .  pantoprazole (PROTONIX) 40 MG tablet, Take 40 mg by mouth 2 (two) times daily., Disp: , Rfl:  .  RESTASIS 0.05 % ophthalmic emulsion, Place 1 drop into both eyes., Disp: , Rfl: 4 .  Tiotropium Bromide-Olodaterol (STIOLTO RESPIMAT) 2.5-2.5 MCG/ACT AERS, Inhale 2 puffs into the lungs daily., Disp: 1 Inhaler, Rfl: 5 .  traMADol (ULTRAM) 50 MG tablet, Take 1-2 tablets (50-100 mg total) by mouth  every 6 (six) hours as needed. (Patient not taking: Reported on 06/04/2016), Disp: 40 tablet, Rfl: 0 .  valACYclovir (VALTREX) 500 MG tablet, Take 500 mg by mouth daily., Disp: , Rfl:  .  White Petrolatum-Mineral Oil (SYSTANE NIGHTTIME OP), Apply 1 drop to eye at bedtime as needed., Disp: , Rfl:   Past Medical History: Past Medical History:  Diagnosis Date  . Arthritis   . Asthma   . COPD (chronic obstructive pulmonary disease) ([place])   . Depression   . GERD (gastroesophageal reflux disease)   . Hyperlipidemia   . Shortness of breath   . Thyroid nodule     Tobacco Use: History  Smoking Status  . Former Smoker  . Packs/day: 1.00  . Years: 54.00  . Types: Cigarettes  Smokeless Tobacco  . Former Systems developer  . Quit date: 07/04/2015    Comment: Currently smoking 5-6 cigarettes per day.    Labs: Recent Review Flowsheet Data    Labs for ITP Cardiac and Pulmonary Rehab Latest Ref Rng & Units 03/02/2008 07/02/2015 07/05/2015   PHART 7.350 - 7.450 - 7.433 7.380   PCO2ART 35.0 - 45.0 mmHg - 38.3 44.9   HCO3 20.0 - 24.0 mEq/L - 25.1(H) 26.7(H)   TCO2 0 - 100 mmol/L 29 26.3 28   O2SAT % - 94.7 96.0      Capillary Blood Glucose: Lab Results  Component Value Date   GLUCAP 90 07/12/2015   GLUCAP 158 (H) 07/06/2015   GLUCAP 106 (H) 07/06/2015   GLUCAP 115 (H) 06/05/2015     ADL UCSD:   Pulmonary Function Assessment:     Pulmonary Function Assessment - 06/04/16 1437      Breath   Bilateral Breath Sounds Decreased;Rhonchi  deminished on right, scattered rhonci in left   Shortness of Breath Limiting activity      Exercise Target Goals: Date: 06/08/16  Exercise Program Goal: Individual exercise prescription set with THRR, safety & activity barriers. Participant demonstrates ability to understand and report RPE using BORG scale, to self-measure pulse accurately, and to acknowledge the importance of the exercise prescription.  Exercise Prescription Goal: Starting with aerobic  activity 30 plus minutes a day, 3 days per week for initial exercise prescription. Provide home exercise prescription and guidelines that participant acknowledges understanding prior to discharge.  Activity Barriers & Risk Stratification:     Activity Barriers & Cardiac Risk Stratification - 06/04/16 1436      Activity Barriers & Cardiac Risk Stratification   Activity Barriers Back Problems;Shortness of Breath      6 Minute Walk:     6 Minute Walk    Row Name 06/08/16 1653         6 Minute Walk   Phase Initial     Distance 900 feet     Walk Time 4.5 minutes     # of Rest Breaks 1  1 minute and 10 seconds     MPH 1.7     METS 2.3     RPE 13     Perceived Dyspnea  3     Symptoms Yes (comment)     Comments lower back pain 3-4/10     Resting HR 92 bpm     Resting BP 142/74     Max Ex. HR 119 bpm     Max Ex. BP 190/86     2 Minute Post BP 144/80       Interval HR   Baseline HR 92     1 Minute HR 105     2 Minute HR 116     3 Minute HR 107     4 Minute HR 110     5 Minute HR 116     6 Minute HR 119     2 Minute Post HR 93     Interval Heart Rate? Yes       Interval Oxygen   Interval Oxygen? Yes     Baseline Oxygen Saturation % 91 %     Baseline Liters of Oxygen 0 L     1 Minute Oxygen Saturation % 93 %     1 Minute Liters of Oxygen 0 L     2 Minute Oxygen Saturation % 90 %     2 Minute Liters of Oxygen 0 L     3 Minute Oxygen Saturation % 92 %     3 Minute Liters of Oxygen 0 L     4 Minute Oxygen Saturation % 93 %     4 Minute Liters of Oxygen 0 L     5 Minute Oxygen Saturation % 91 %     5 Minute Liters of Oxygen 0 L     6 Minute Oxygen Saturation % 91 %     6 Minute Liters of Oxygen 0 L     2 Minute Post Oxygen Saturation % 97 %  2 Minute Post Liters of Oxygen 0 L        Initial Exercise Prescription:     Initial Exercise Prescription - 06/08/16 1600      Date of Initial Exercise RX and Referring Provider   Date 06/08/16   Referring Provider  Dr. Elsworth Soho     Bike   Level 0.5   Minutes 17     NuStep   Level 2   Minutes 17   METs 1.5     Track   Laps 5   METs 17     Prescription Details   Frequency (times per week) 2   Duration Progress to 45 minutes of aerobic exercise without signs/symptoms of physical distress     Intensity   THRR 40-80% of Max Heartrate 58-116   Ratings of Perceived Exertion 11-13   Perceived Dyspnea 0-4     Progression   Progression Continue progressive overload as per policy without signs/symptoms or physical distress.     Resistance Training   Training Prescription Yes   Weight GREEN BANDS   Reps 10-12      Perform Capillary Blood Glucose checks as needed.  Exercise Prescription Changes:   Exercise Comments:   Discharge Exercise Prescription (Final Exercise Prescription Changes):    Nutrition:  Target Goals: Understanding of nutrition guidelines, daily intake of sodium '1500mg'$ , cholesterol '200mg'$ , calories 30% from fat and 7% or less from saturated fats, daily to have 5 or more servings of fruits and vegetables.  Biometrics:    Nutrition Therapy Plan and Nutrition Goals:   Nutrition Discharge: Rate Your Plate Scores:   Psychosocial: Target Goals: Acknowledge presence or absence of depression, maximize coping skills, provide positive support system. Participant is able to verbalize types and ability to use techniques and skills needed for reducing stress and depression.  Initial Review & Psychosocial Screening:     Initial Psych Review & Screening - 06/04/16 1443      Initial Review   Current issues with History of Depression     Family Dynamics   Good Support System? Yes     Barriers   Psychosocial barriers to participate in program There are no identifiable barriers or psychosocial needs.     Screening Interventions   Interventions Encouraged to exercise      Quality of Life Scores:   PHQ-9: Recent Review Flowsheet Data    Depression screen [facility] 2/9  06/04/2016 08/04/2015   Decreased Interest 0 0   Down, Depressed, Hopeless 0 0   PHQ - 2 Score 0 0      Psychosocial Evaluation and Intervention:     Psychosocial Evaluation - 06/04/16 1448      Psychosocial Evaluation & Interventions   Interventions Encouraged to exercise with the program and follow exercise prescription      Psychosocial Re-Evaluation:  Education: Education Goals: Education classes will be provided on a weekly basis, covering required topics. Participant will state understanding/return demonstration of topics presented.  Learning Barriers/Preferences:     Learning Barriers/Preferences - 06/04/16 1436      Learning Barriers/Preferences   Learning Barriers None   Learning Preferences Computer/Internet;Group Instruction;Individual Instruction;Skilled Demonstration;Verbal Instruction;Written Material      Education Topics: Risk Factor Reduction:  -Group instruction that is supported by a PowerPoint presentation. Instructor discusses the definition of a risk factor, different risk factors for pulmonary disease, and how the heart and lungs work together.     Nutrition for Pulmonary Patient:  -Group instruction provided by PowerPoint slides, verbal  discussion, and written materials to support subject matter. The instructor gives an explanation and review of healthy diet recommendations, which includes a discussion on weight management, recommendations for fruit and vegetable consumption, as well as protein, fluid, caffeine, fiber, sodium, sugar, and alcohol. Tips for eating when patients are short of breath are discussed.   Pursed Lip Breathing:  -Group instruction that is supported by demonstration and informational handouts. Instructor discusses the benefits of pursed lip and diaphragmatic breathing and detailed demonstration on how to preform both.     Oxygen Safety:  -Group instruction provided by PowerPoint, verbal discussion, and written material to  support subject matter. There is an overview of "What is Oxygen" and "Why do we need it".  Instructor also reviews how to create a safe environment for oxygen use, the importance of using oxygen as prescribed, and the risks of noncompliance. There is a brief discussion on traveling with oxygen and resources the patient may utilize.   Oxygen Equipment:  -Group instruction provided by [facility] Staff utilizing handouts, written materials, and equipment demonstrations.   Signs and Symptoms:  -Group instruction provided by written material and verbal discussion to support subject matter. Warning signs and symptoms of infection, stroke, and heart attack are reviewed and when to call the physician/911 reinforced. Tips for preventing the spread of infection discussed.   Advanced Directives:  -Group instruction provided by verbal instruction and written material to support subject matter. Instructor reviews Advanced Directive laws and proper instruction for filling out document.   Pulmonary Video:  -Group video education that reviews the importance of medication and oxygen compliance, exercise, good nutrition, pulmonary hygiene, and pursed lip and diaphragmatic breathing for the pulmonary patient.   Exercise for the Pulmonary Patient:  -Group instruction that is supported by a PowerPoint presentation. Instructor discusses benefits of exercise, core components of exercise, frequency, duration, and intensity of an exercise routine, importance of utilizing pulse oximetry during exercise, safety while exercising, and options of places to exercise outside of rehab.     Pulmonary Medications:  -Verbally interactive group education provided by instructor with focus on inhaled medications and proper administration.   Anatomy and Physiology of the Respiratory System and Intimacy:  -Group instruction provided by PowerPoint, verbal discussion, and written material to support subject matter. Instructor  reviews respiratory cycle and anatomical components of the respiratory system and their functions. Instructor also reviews differences in obstructive and restrictive respiratory diseases with examples of each. Intimacy, Sex, and Sexuality differences are reviewed with a discussion on how relationships can change when diagnosed with pulmonary disease. Common sexual concerns are reviewed.   Knowledge Questionnaire Score:   Core Components/Risk Factors/Patient Goals at Admission:     Personal Goals and Risk Factors at Admission - 06/04/16 1438      Core Components/Risk Factors/Patient Goals on Admission   Improve shortness of breath with ADL's Yes   Intervention Provide education, individualized exercise plan and daily activity instruction to help decrease symptoms of SOB with activities of daily living.   Expected Outcomes Short Term: Achieves a reduction of symptoms when performing activities of daily living.   Develop more efficient breathing techniques such as purse lipped breathing and diaphragmatic breathing; and practicing self-pacing with activity Yes   Intervention Provide education, demonstration and support about specific breathing techniuqes utilized for more efficient breathing. Include techniques such as pursed lipped breathing, diaphragmatic breathing and self-pacing activity.   Expected Outcomes Short Term: Participant will be able to demonstrate and use breathing techniques as  needed throughout daily activities.   Increase knowledge of respiratory medications and ability to use respiratory devices properly  Yes   Intervention Provide education and demonstration as needed of appropriate use of medications, inhalers, and oxygen therapy.   Expected Outcomes Short Term: Achieves understanding of medications use. Understands that oxygen is a medication prescribed by physician. Demonstrates appropriate use of inhaler and oxygen therapy.      Core Components/Risk Factors/Patient Goals  Review:    Core Components/Risk Factors/Patient Goals at Discharge (Final Review):    ITP Comments:   Comments:

## 2016-06-09 ENCOUNTER — Ambulatory Visit (HOSPITAL_COMMUNITY): Payer: Self-pay | Admitting: *Deleted

## 2016-06-10 ENCOUNTER — Other Ambulatory Visit (HOSPITAL_BASED_OUTPATIENT_CLINIC_OR_DEPARTMENT_OTHER): Payer: Commercial Managed Care - HMO

## 2016-06-10 ENCOUNTER — Ambulatory Visit (HOSPITAL_COMMUNITY)
Admission: RE | Admit: 2016-06-10 | Discharge: 2016-06-10 | Disposition: A | Discharge status: Home / Self Care | Payer: Commercial Managed Care - HMO | Source: Ambulatory Visit | Attending: Internal Medicine | Admitting: Internal Medicine

## 2016-06-10 DIAGNOSIS — J984 Other disorders of lung: Secondary | ICD-10-CM | POA: Diagnosis not present

## 2016-06-10 DIAGNOSIS — I700 Atherosclerosis of aorta: Secondary | ICD-10-CM | POA: Insufficient documentation

## 2016-06-10 DIAGNOSIS — I2510 Atherosclerotic heart disease of native coronary artery without angina pectoris: Secondary | ICD-10-CM | POA: Diagnosis not present

## 2016-06-10 DIAGNOSIS — C3492 Malignant neoplasm of unspecified part of left bronchus or lung: Secondary | ICD-10-CM

## 2016-06-10 DIAGNOSIS — J432 Centrilobular emphysema: Secondary | ICD-10-CM | POA: Insufficient documentation

## 2016-06-10 DIAGNOSIS — C3412 Malignant neoplasm of upper lobe, left bronchus or lung: Secondary | ICD-10-CM | POA: Diagnosis not present

## 2016-06-10 DIAGNOSIS — Z902 Acquired absence of lung [part of]: Secondary | ICD-10-CM | POA: Diagnosis not present

## 2016-06-10 LAB — CBC WITH DIFFERENTIAL/PLATELET
BASO%: 0.9 % (ref 0.0–2.0)
Basophils Absolute: 0.1 10*3/uL (ref 0.0–0.1)
EOS%: 3.3 % (ref 0.0–7.0)
Eosinophils Absolute: 0.2 10*3/uL (ref 0.0–0.5)
HEMATOCRIT: 38.6 % (ref 34.8–46.6)
HGB: 12.6 g/dL (ref 11.6–15.9)
LYMPH#: 2.2 10*3/uL (ref 0.9–3.3)
LYMPH%: 41.6 % (ref 14.0–49.7)
MCH: 29.6 pg (ref 25.1–34.0)
MCHC: 32.6 g/dL (ref 31.5–36.0)
MCV: 90.8 fL (ref 79.5–101.0)
MONO#: 0.5 10*3/uL (ref 0.1–0.9)
MONO%: 8.6 % (ref 0.0–14.0)
NEUT%: 45.6 % (ref 38.4–76.8)
NEUTROS ABS: 2.5 10*3/uL (ref 1.5–6.5)
PLATELETS: 247 10*3/uL (ref 145–400)
RBC: 4.25 10*6/uL (ref 3.70–5.45)
RDW: 16.7 % — ABNORMAL HIGH (ref 11.2–14.5)
WBC: 5.4 10*3/uL (ref 3.9–10.3)

## 2016-06-10 LAB — COMPREHENSIVE METABOLIC PANEL
ALT: 24 U/L (ref 0–55)
ANION GAP: 8 meq/L (ref 3–11)
AST: 35 U/L — AB (ref 5–34)
Albumin: 3.6 g/dL (ref 3.5–5.0)
Alkaline Phosphatase: 112 U/L (ref 40–150)
BILIRUBIN TOTAL: 0.63 mg/dL (ref 0.20–1.20)
BUN: 8 mg/dL (ref 7.0–26.0)
CHLORIDE: 107 meq/L (ref 98–109)
CO2: 29 meq/L (ref 22–29)
CREATININE: 1 mg/dL (ref 0.6–1.1)
Calcium: 9 mg/dL (ref 8.4–10.4)
EGFR: 66 mL/min/{1.73_m2} — ABNORMAL LOW (ref >90)
Glucose: 121 mg/dl (ref 70–140)
Potassium: 2.9 mEq/L — CL (ref 3.5–5.1)
Sodium: 144 mEq/L (ref 136–145)
TOTAL PROTEIN: 7.2 g/dL (ref 6.4–8.3)

## 2016-06-10 MED ORDER — IOPAMIDOL (ISOVUE-300) INJECTION 61%
75.0000 mL | Freq: Once | INTRAVENOUS | Status: AC | PRN
  Administered 2016-06-10: 75 mL via INTRAVENOUS

## 2016-06-11 ENCOUNTER — Telehealth: Payer: Self-pay | Admitting: *Deleted

## 2016-06-11 MED ORDER — POTASSIUM CHLORIDE CRYS ER 20 MEQ PO TBCR: 20.0000 meq | EXTENDED_RELEASE_TABLET | Freq: Two times a day (BID) | ORAL | 0 refills | Status: DC

## 2016-06-11 NOTE — Telephone Encounter (Signed)
Called notified pt of results and rx sent to pharmacy. Advised pt to contact PCP regarding further evaluation and monitoring of K+ levels.

## 2016-06-11 NOTE — Telephone Encounter (Signed)
-----   Message from Curt Bears, MD sent at 06/10/2016  1:12 PM EDT ----- K is low. Please order K Dur 40 meq po qd X 7 days. She needs to check with her PCP for the low K. ----- Message ----- From: Interface, Lab In Three Zero One Sent: 06/10/2016  11:00 AM To: Curt Bears, MD

## 2016-06-14 ENCOUNTER — Telehealth: Payer: Self-pay | Admitting: Internal Medicine

## 2016-06-14 IMAGING — CR DG CHEST 1V PORT
1 series · 1 of 1 positions shown · non-contrast
Comparison: 07/19/2015 and prior radiographs

CLINICAL DATA: 74-year-old female with intrabronchial valves
placement.

EXAM:
PORTABLE CHEST 1 VIEW

[AP]
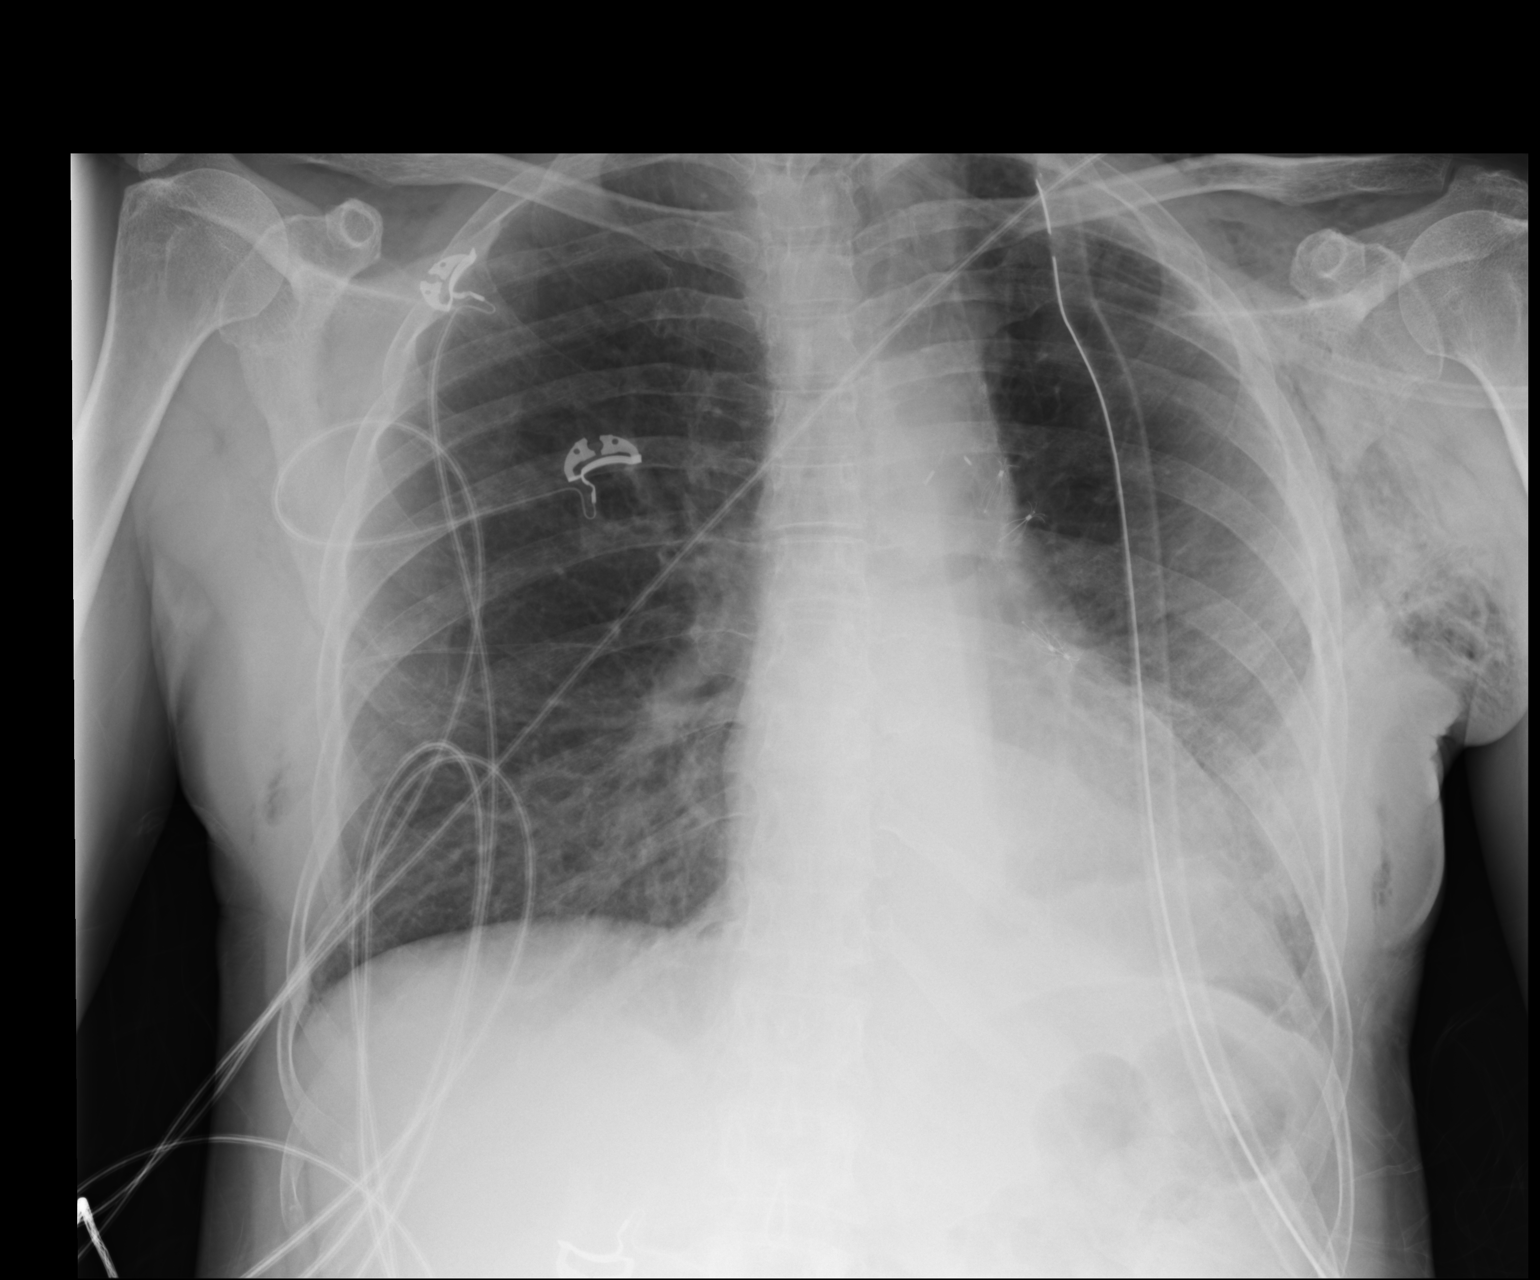

[1 of 1 positions shown; findings below may reference images not displayed]

FINDINGS: Cardiomediastinal silhouette is unchanged.

Left interbronchial valves noted.

A left thoracostomy tube is unchanged.

There is no evidence of pneumothorax.

Left basilar atelectasis again identified as well as left
subcutaneous emphysema.

There has been little interval change since the prior study.
IMPRESSION: Unchanged appearance of the chest as described. Continued left
basilar atelectasis. No evidence of pneumothorax.

## 2016-06-14 NOTE — Telephone Encounter (Signed)
PAL - MOVED F/U FROM 10/12 TO 10/10. SPOKE WITH PATIENT SHE IS AWARE.

## 2016-06-15 ENCOUNTER — Telehealth: Payer: Self-pay | Admitting: Internal Medicine

## 2016-06-15 ENCOUNTER — Encounter: Payer: Self-pay | Admitting: Internal Medicine

## 2016-06-15 ENCOUNTER — Encounter (HOSPITAL_COMMUNITY): Payer: Commercial Managed Care - HMO

## 2016-06-15 ENCOUNTER — Telehealth (HOSPITAL_COMMUNITY): Payer: Self-pay | Admitting: *Deleted

## 2016-06-15 ENCOUNTER — Ambulatory Visit (HOSPITAL_BASED_OUTPATIENT_CLINIC_OR_DEPARTMENT_OTHER): Payer: Commercial Managed Care - HMO | Admitting: Internal Medicine

## 2016-06-15 VITALS — BP 173/93 | HR 81 | Temp 98.0°F | Resp 21 | Ht 63.0 in | Wt 148.5 lb

## 2016-06-15 DIAGNOSIS — C3492 Malignant neoplasm of unspecified part of left bronchus or lung: Secondary | ICD-10-CM

## 2016-06-15 DIAGNOSIS — I10 Essential (primary) hypertension: Secondary | ICD-10-CM | POA: Diagnosis not present

## 2016-06-15 DIAGNOSIS — E786 Lipoprotein deficiency: Secondary | ICD-10-CM

## 2016-06-15 DIAGNOSIS — C3412 Malignant neoplasm of upper lobe, left bronchus or lung: Secondary | ICD-10-CM | POA: Diagnosis not present

## 2016-06-15 HISTORY — DX: Essential (primary) hypertension: I10

## 2016-06-15 NOTE — Progress Notes (Signed)
[place] Telephone:([phone]   Fax:([phone]) [place], [place] [address] [place] [address]  DIAGNOSIS: Stage IB (T2a, N0, M0) non-small cell lung cancer, adenocarcinoma presented with left upper lobe nodule diagnosed in September 2016.  PRIOR THERAPY: Status post left VATS with left upper lobectomy and mediastinal lymph node dissection on 07/04/2015 under the care of Dr. Roxan Hockey.  CURRENT THERAPY: Observation.  INTERVAL HISTORY: [name] 75 y.o. female returns to the clinic today for six-month follow-up visit. The patient is feeling fine today with no specific complaints. She denied having any significant shortness breath except with exertion. She denied having any hemoptysis. She denied having any significant weight loss or night sweats. She has no nausea or vomiting. She had repeat CT scan of the chest performed recently and she is here for evaluation and discussion of her scan results.  MEDICAL HISTORY: Past Medical History:  Diagnosis Date  . Arthritis   . Asthma   . COPD (chronic obstructive pulmonary disease) ([place])   . Depression   . GERD (gastroesophageal reflux disease)   . Hyperlipidemia   . Shortness of breath   . Thyroid nodule     ALLERGIES:  has No Known Allergies.  MEDICATIONS:  Current Outpatient Prescriptions  Medication Sig Dispense Refill  . albuterol (PROVENTIL HFA;VENTOLIN HFA) 108 (90 BASE) MCG/ACT inhaler Inhale 2 puffs into the lungs every 6 (six) hours as needed for wheezing or shortness of breath. 1 Inhaler 11  . aspirin 81 MG tablet Take 81 mg by mouth every evening.     . donepezil (ARICEPT) 10 MG tablet Take 10 mg by mouth every evening.  2  . ezetimibe (ZETIA) 10 MG tablet Take 10 mg by mouth at bedtime.     . fluorometholone (FML) 0.1 % ophthalmic suspension Place 1 drop into both eyes daily.  1  . Melatonin 2.5 MG CAPS Take 1 capsule by mouth at bedtime as  needed.    . mirtazapine (REMERON) 15 MG tablet Take 7.5 mg by mouth at bedtime.   0  . niacin (NIASPAN) 500 MG CR tablet Take 500 mg by mouth every evening.     Marland Kitchen OVER THE COUNTER MEDICATION Take 1 capsule by mouth daily as needed.    . pantoprazole (PROTONIX) 40 MG tablet Take 40 mg by mouth 2 (two) times daily.    . potassium chloride SA (K-DUR,KLOR-CON) 20 MEQ tablet Take 1 tablet (20 mEq total) by mouth 2 (two) times daily. 14 tablet 0  . RESTASIS 0.05 % ophthalmic emulsion Place 1 drop into both eyes.  4  . Tiotropium Bromide-Olodaterol (STIOLTO RESPIMAT) 2.5-2.5 MCG/ACT AERS Inhale 2 puffs into the lungs daily. 1 Inhaler 5  . traMADol (ULTRAM) 50 MG tablet Take 1-2 tablets (50-100 mg total) by mouth every 6 (six) hours as needed. (Patient not taking: Reported on 06/04/2016) 40 tablet 0  . valACYclovir (VALTREX) 500 MG tablet Take 500 mg by mouth daily.    Dema Severin Petrolatum-Mineral Oil (SYSTANE NIGHTTIME OP) Apply 1 drop to eye at bedtime as needed.     No current facility-administered medications for this visit.     SURGICAL HISTORY:  Past Surgical History:  Procedure Laterality Date  . BIOPSY THYROID    . CATARACT EXTRACTION     left eye  . CATARACT EXTRACTION W/ INTRAOCULAR LENS IMPLANT     RIGHT  . CESAREAN SECTION    .  EYE SURGERY    . MASS EXCISION N/A 12/13/2014   Procedure: EXCISION OF VOCAL CORD POLYPS;  Surgeon: Jerrell Belfast, MD;  Location: [facility];  Service: ENT;  Laterality: N/A;  . MICROLARYNGOSCOPY N/A 12/13/2014   Procedure: MICROLARYNGOSCOPY;  Surgeon: Jerrell Belfast, MD;  Location: [facility];  Service: ENT;  Laterality: N/A;  . TUBAL LIGATION    . VIDEO ASSISTED THORACOSCOPY (VATS)/ LOBECTOMY Left 07/04/2015   Procedure: VIDEO ASSISTED THORACOSCOPY (VATS) LEFT UPPER LOBECTOMY;  Surgeon: Melrose Nakayama, MD;  Location: [facility];  Service: Thoracic;  Laterality: Left;  Marland Kitchen VIDEO BRONCHOSCOPY WITH INSERTION OF INTERBRONCHIAL VALVE (IBV) N/A 07/11/2015   Procedure: VIDEO  BRONCHOSCOPY WITH INSERTION OF INTERBRONCHIAL VALVE (IBV);  Surgeon: Melrose Nakayama, MD;  Location: [facility];  Service: Thoracic;  Laterality: N/A;  . VIDEO BRONCHOSCOPY WITH INSERTION OF INTERBRONCHIAL VALVE (IBV) N/A 09/10/2015   Procedure: VIDEO BRONCHOSCOPY WITH REMOVAL OF INTERBRONCHIAL VALVES;  Surgeon: Melrose Nakayama, MD;  Location: [facility];  Service: Thoracic;  Laterality: N/A;    REVIEW OF SYSTEMS:  Constitutional: positive for fatigue Eyes: negative Ears, nose, mouth, throat, and face: negative Respiratory: negative Cardiovascular: negative Gastrointestinal: negative Genitourinary:negative Integument/breast: negative Hematologic/lymphatic: negative Musculoskeletal:negative Neurological: negative Behavioral/Psych: negative Endocrine: negative Allergic/Immunologic: negative   PHYSICAL EXAMINATION: General appearance: alert, cooperative and no distress Head: Normocephalic, without obvious abnormality, atraumatic Neck: no adenopathy, no JVD, supple, symmetrical, trachea midline and thyroid not enlarged, symmetric, no tenderness/mass/nodules Lymph nodes: Cervical, supraclavicular, and axillary nodes normal. Resp: clear to auscultation bilaterally Back: symmetric, no curvature. ROM normal. No CVA tenderness. Cardio: regular rate and rhythm, S1, S2 normal, no murmur, click, rub or gallop GI: soft, non-tender; bowel sounds normal; no masses,  no organomegaly Extremities: extremities normal, atraumatic, no cyanosis or edema  ECOG PERFORMANCE STATUS: 1 - Symptomatic but completely ambulatory  Blood pressure (!) 173/93, pulse 81, temperature 98 F (36.7 C), temperature source Oral, resp. rate (!) 21, height '5\' 3"'$  (1.6 m), weight 148 lb 8 oz (67.4 kg), SpO2 95 %.  LABORATORY DATA: Lab Results  Component Value Date   WBC 5.4 06/10/2016   HGB 12.6 06/10/2016   HCT 38.6 06/10/2016   MCV 90.8 06/10/2016   PLT 247 06/10/2016      Chemistry      Component Value Date/Time     NA 144 06/10/2016 1049   K 2.9 (LL) 06/10/2016 1049   CL 107 09/03/2015 1214   CO2 29 06/10/2016 1049   BUN 8.0 06/10/2016 1049   CREATININE 1.0 06/10/2016 1049      Component Value Date/Time   CALCIUM 9.0 06/10/2016 1049   ALKPHOS 112 06/10/2016 1049   AST 35 (H) 06/10/2016 1049   ALT 24 06/10/2016 1049   BILITOT 0.63 06/10/2016 1049       RADIOGRAPHIC STUDIES: Ct Chest W Contrast  Result Date: 06/10/2016 CLINICAL DATA:  75 year old female with history of left-sided lung cancer diagnosed in 06/2015 status post left upper lobectomy. Worsening cough since surgery. Former smoker. EXAM: CT CHEST WITH CONTRAST TECHNIQUE: Multidetector CT imaging of the chest was performed during intravenous contrast administration. CONTRAST:  57m ISOVUE-300 IOPAMIDOL (ISOVUE-300) INJECTION 61% COMPARISON:  Chest CT 03/16/2016. FINDINGS: Cardiovascular: Heart size is normal. Trace amount of pericardial fluid and/or thickening, unlikely to be of any hemodynamic significance at this time. No associated pericardial calcification. There is aortic atherosclerosis, as well as atherosclerosis of the great vessels of the mediastinum and the coronary arteries, including calcified atherosclerotic plaque in the left anterior  descending and right coronary arteries. Mediastinum/Nodes: No pathologically enlarged mediastinal or hilar lymph nodes. Esophagus is unremarkable in appearance. No axillary lymphadenopathy. Heterogeneity throughout the thyroid gland with multiple small thyroid nodules, similar to prior examinations, presumably a goiter. Lungs/Pleura: Status post left upper lobectomy. Compensatory hyperexpansion of the left lower lobe. Persistent nodular scarring in the periphery of the left lower lobe is very similar to the prior study, measuring 1.7 x 1.1 cm on today's examination (unchanged) on image 99 of series 2. Mild linear scarring also noted in the medial segment of the right middle lobe and in the basal segments  of the right lower lobe. No definite new suspicious appearing pulmonary nodules or masses. There is no acute consolidative airspace disease. No pleural effusions. Diffuse bronchial wall thickening with mild to moderate centrilobular and paraseptal emphysema. Upper Abdomen: Aortic atherosclerosis. Musculoskeletal: There are no aggressive appearing lytic or blastic lesions noted in the visualized portions of the skeleton. IMPRESSION: 1. Stable postoperative findings following left upper lobectomy, with chronic scarring in the periphery of the left lower lobe which is unchanged compared to the prior examination. No definite evidence to suggest local recurrence of disease or metastatic disease in the thorax on today's examination. 2. Mild diffuse bronchial wall thickening with mild to moderate centrilobular and paraseptal emphysema; imaging findings suggestive of underlying COPD. 3. Aortic atherosclerosis, in addition to 2 vessel coronary artery disease. Assessment for potential risk factor modification, dietary therapy or pharmacologic therapy may be warranted, if clinically indicated. Electronically Signed   By: Vinnie Langton M.D.   On: 06/10/2016 16:15    ASSESSMENT AND PLAN: This is a very pleasant 75 years old African-American female with a stage IB non-small cell lung cancer status post right upper lobectomy with lymph node dissection and currently on observation. The recent CT scan of the chest showed no clear evidence for disease recurrence. I discussed the scan results with the patient today.  I recommended for her to have repeat CT scan of the chest in 6 months for restaging of her disease. For the hypertension, the patient denied having any history of hypertension and she is not on any medications but she was very anxious and worried about her visit today. I recommended for her to consult with her primary care physician for evaluation of her hypertension. For the hypokalemia, she will just started  on potassium chloride 40 mEq by mouth daily for 10 days. She was advised to call immediately if she has any concerning symptoms in the interval. The patient voices understanding of current disease status and treatment options and is in agreement with the current care plan.  All questions were answered. The patient knows to call the clinic with any problems, questions or concerns. We can certainly see the patient much sooner if necessary.  Disclaimer: This note was dictated with voice recognition software. Similar sounding words can inadvertently be transcribed and may not be corrected upon review.

## 2016-06-15 NOTE — Telephone Encounter (Signed)
Spoke with patient re April 2018 lab/fu. Central radiology will call re scan.

## 2016-06-16 DIAGNOSIS — Z0130 Encounter for examination of blood pressure without abnormal findings: Secondary | ICD-10-CM | POA: Diagnosis not present

## 2016-06-17 ENCOUNTER — Ambulatory Visit: Payer: Commercial Managed Care - HMO | Admitting: Internal Medicine

## 2016-06-17 ENCOUNTER — Encounter (HOSPITAL_COMMUNITY)
Admission: RE | Admit: 2016-06-17 | Discharge: 2016-06-17 | Disposition: A | Discharge status: Home / Self Care | Payer: Commercial Managed Care - HMO | Source: Ambulatory Visit | Attending: Pulmonary Disease | Admitting: Pulmonary Disease

## 2016-06-17 VITALS — Wt 148.4 lb

## 2016-06-17 DIAGNOSIS — C3492 Malignant neoplasm of unspecified part of left bronchus or lung: Secondary | ICD-10-CM

## 2016-06-17 DIAGNOSIS — J449 Chronic obstructive pulmonary disease, unspecified: Secondary | ICD-10-CM | POA: Diagnosis not present

## 2016-06-17 NOTE — Progress Notes (Signed)
Daily Session Note  Patient Details  Name: [name] MRN: [id number] Date of Birth: Aug 03, 1941 Referring Provider:   April Manson Pulmonary Rehab Walk Test from 06/08/2016 in [place]  Referring Provider  Dr. Elsworth Soho      Encounter Date: 06/17/2016  Check In:     Session Check In - 06/17/16 1348      Check-In   Location [facility]   Staff Present Rosebud Poles, RN, BSN;Molly diVincenzo, MS, ACSM RCEP, Exercise Physiologist;Dehlia Kilner Ysidro Evert, RN;Portia Rollene Rotunda, RN, BSN   Supervising physician immediately available to respond to emergencies [facility] immediately available   Physician(s) Dr. Alfredia Ferguson   Medication changes reported     No   Fall or balance concerns reported    No   Warm-up and Cool-down Performed as group-led instruction   Resistance Training Performed Yes   VAD Patient? No     Pain Assessment   Currently in Pain? No/denies   Multiple Pain Sites No      Capillary Blood Glucose: No results found for this or any previous visit (from the past 24 hour(s)).      Exercise Prescription Changes - 06/17/16 1500      Response to Exercise   Blood Pressure (Admit) 118/64   Blood Pressure (Exercise) 130/70   Blood Pressure (Exit) 124/80   Heart Rate (Admit) 65 bpm   Heart Rate (Exercise) 87 bpm   Heart Rate (Exit) 87 bpm   Oxygen Saturation (Admit) 94 %   Oxygen Saturation (Exercise) 97 %   Oxygen Saturation (Exit) 94 %   Rating of Perceived Exertion (Exercise) 13   Perceived Dyspnea (Exercise) 2   Duration Progress to 45 minutes of aerobic exercise without signs/symptoms of physical distress   Intensity Other (comment)  40-80% of THRR     Progression   Progression Continue to progress workloads to maintain intensity without signs/symptoms of physical distress.     Resistance Training   Training Prescription Yes   Weight green bands   Reps 10-12  10 minutes of strength training     NuStep   Level 2    Minutes 17   METs 1.8     Track   Laps 11   METs 17     Goals Met:  Exercise tolerated well No report of cardiac concerns or symptoms Strength training completed today  Goals Unmet:  Not Applicable  Comments: Service time is from 1330 to 1515    Dr. Rush Farmer is Medical Director for Pulmonary Rehab at [facility].

## 2016-06-18 ENCOUNTER — Telehealth: Payer: Self-pay | Admitting: Pulmonary Disease

## 2016-06-18 MED ORDER — ALBUTEROL SULFATE HFA 108 (90 BASE) MCG/ACT IN AERS: 2.0000 | INHALATION_SPRAY | Freq: Four times a day (QID) | RESPIRATORY_TRACT | 11 refills | Status: DC | PRN

## 2016-06-18 NOTE — Telephone Encounter (Signed)
Pt requesting refill on rescue inhaler.  This has been sent to preferred pharmacy.  Nothing further needed.

## 2016-06-22 ENCOUNTER — Encounter (HOSPITAL_COMMUNITY)
Admission: RE | Admit: 2016-06-22 | Discharge: 2016-06-22 | Disposition: A | Discharge status: Home / Self Care | Payer: Commercial Managed Care - HMO | Source: Ambulatory Visit | Attending: Pulmonary Disease | Admitting: Pulmonary Disease

## 2016-06-22 ENCOUNTER — Encounter: Payer: Self-pay | Admitting: Thoracic Surgery (Cardiothoracic Vascular Surgery)

## 2016-06-22 ENCOUNTER — Ambulatory Visit (INDEPENDENT_AMBULATORY_CARE_PROVIDER_SITE_OTHER): Payer: Commercial Managed Care - HMO | Admitting: Thoracic Surgery (Cardiothoracic Vascular Surgery)

## 2016-06-22 VITALS — BP 146/83 | HR 99 | Resp 18 | Ht 63.0 in | Wt 147.4 lb

## 2016-06-22 DIAGNOSIS — Z902 Acquired absence of lung [part of]: Secondary | ICD-10-CM | POA: Diagnosis not present

## 2016-06-22 DIAGNOSIS — C3490 Malignant neoplasm of unspecified part of unspecified bronchus or lung: Secondary | ICD-10-CM | POA: Diagnosis not present

## 2016-06-22 DIAGNOSIS — J449 Chronic obstructive pulmonary disease, unspecified: Secondary | ICD-10-CM

## 2016-06-22 DIAGNOSIS — C3492 Malignant neoplasm of unspecified part of left bronchus or lung: Secondary | ICD-10-CM

## 2016-06-22 NOTE — Progress Notes (Signed)
Daily Session Note  Patient Details  Name: [name] MRN: [id number] Date of Birth: 06-12-1941 Referring Provider:   April Manson Pulmonary Rehab Walk Test from 06/08/2016 in [place]  Referring Provider  Dr. Elsworth Soho      Encounter Date: 06/22/2016  Check In:     Session Check In - 06/22/16 1330      Check-In   Location [facility]   Staff Present Rosebud Poles, RN, BSN;Molly diVincenzo, MS, ACSM RCEP, Exercise Physiologist;Portia Rollene Rotunda, RN, BSN   Supervising physician immediately available to respond to emergencies [facility] immediately available   Physician(s) Dr. Alfredia Ferguson   Medication changes reported     No   Fall or balance concerns reported    No   Warm-up and Cool-down Performed as group-led instruction   Resistance Training Performed Yes   VAD Patient? No     Pain Assessment   Currently in Pain? No/denies   Multiple Pain Sites No      Capillary Blood Glucose: No results found for this or any previous visit (from the past 24 hour(s)).      Exercise Prescription Changes - 06/22/16 1500      Response to Exercise   Blood Pressure (Admit) 166/70   Blood Pressure (Exercise) 164/80   Blood Pressure (Exit) 132/72   Heart Rate (Admit) 98 bpm   Heart Rate (Exercise) 135 bpm   Heart Rate (Exit) 110 bpm   Oxygen Saturation (Admit) 94 %   Oxygen Saturation (Exercise) 91 %   Oxygen Saturation (Exit) 95 %   Rating of Perceived Exertion (Exercise) 13   Perceived Dyspnea (Exercise) 2   Duration Progress to 45 minutes of aerobic exercise without signs/symptoms of physical distress   Intensity Other (comment)     Progression   Progression Continue to progress workloads to maintain intensity without signs/symptoms of physical distress.     Resistance Training   Training Prescription Yes   Weight green bands   Reps 10-12  10 minutes of strength training     Interval Training   Interval Training No     Bike   Level 0.5   Minutes 17     NuStep   Level 2   Minutes 17   METs 2     Track   Laps 7   METs 17     Goals Met:  Exercise tolerated well Strength training completed today  Goals Unmet:  Not Applicable  Comments: Service time is from 1330 to 1510    Dr. Rush Farmer is Medical Director for Pulmonary Rehab at [facility].

## 2016-06-22 NOTE — Progress Notes (Signed)
[address]       [place] [address]             [phone]       HPI: [name] returns for a 1 year follow-up visit.  She is a 75 year old woman who had a thoracoscopic left upper lobectomy for a stage IB adenocarcinoma on 07/04/2015. Her postoperative course was complicated by a prolonged air leak, which required endobronchial valve placement. Those were ultimately removed in January of this year. She did not require adjuvant chemotherapy.  I last saw her in the office in July. She was doing well at that point in time.  She saw Dr. Julien Nordmann recently and had a CT done. She sees him again in November. She also saw Dr. Baird Cancer recently.  Overall she feels well her biggest complaint recently has been cramping. She was found to be hypokalemic on her labs a couple weeks ago and was started on potassium. She has not had any unusual headaches or visual changes. She does not have any new bone or joint pain. She is not having any respiratory issues.  Past Medical History:  Diagnosis Date  . Arthritis   . Asthma   . COPD (chronic obstructive pulmonary disease) ([place])   . Depression   . GERD (gastroesophageal reflux disease)   . Hyperlipidemia   . Hypertension 06/15/2016  . Shortness of breath   . Thyroid nodule     Current Outpatient Prescriptions  Medication Sig Dispense Refill  . albuterol (PROVENTIL HFA;VENTOLIN HFA) 108 (90 Base) MCG/ACT inhaler Inhale 2 puffs into the lungs every 6 (six) hours as needed for wheezing or shortness of breath. 1 Inhaler 11  . aspirin 81 MG tablet Take 81 mg by mouth every evening.     . donepezil (ARICEPT) 10 MG tablet Take 10 mg by mouth every evening.  2  . Melatonin 2.5 MG CAPS Take 1 capsule by mouth at bedtime as needed.    . mirtazapine (REMERON) 15 MG tablet Take 7.5 mg by mouth at bedtime.   0  . niacin (NIASPAN) 500 MG CR tablet Take 500 mg by mouth every evening.     Marland Kitchen OVER THE COUNTER MEDICATION Take 1 capsule by  mouth daily as needed.    . pantoprazole (PROTONIX) 40 MG tablet Take 40 mg by mouth 2 (two) times daily.    . potassium chloride SA (K-DUR,KLOR-CON) 20 MEQ tablet Take 1 tablet (20 mEq total) by mouth 2 (two) times daily. 14 tablet 0  . RESTASIS 0.05 % ophthalmic emulsion Place 1 drop into both eyes.  4  . Tiotropium Bromide-Olodaterol (STIOLTO RESPIMAT) 2.5-2.5 MCG/ACT AERS Inhale 2 puffs into the lungs daily. 1 Inhaler 5  . valACYclovir (VALTREX) 500 MG tablet Take 500 mg by mouth daily.    Dema Severin Petrolatum-Mineral Oil (SYSTANE NIGHTTIME OP) Apply 1 drop to eye at bedtime as needed.    . ezetimibe (ZETIA) 10 MG tablet Take 10 mg by mouth at bedtime.      No current facility-administered medications for this visit.     Physical Exam BP (!) 146/83   Pulse 99   Resp 18   Ht '5\' 3"'$  (1.6 m)   Wt 147 lb 6.4 oz (66.9 kg)   SpO2 95% Comment: ON RA  BMI 26.69 kg/m  75 year old woman in no acute distress Alert and oriented 3 with no focal deficits No cervical or supraclavicular adenopathy Incisions well healed Diminished breath sounds at  left base, otherwise clear Cardiac regular rate and rhythm  Diagnostic Tests: CT CHEST WITH CONTRAST  TECHNIQUE: Multidetector CT imaging of the chest was performed during intravenous contrast administration.  CONTRAST:  76m ISOVUE-300 IOPAMIDOL (ISOVUE-300) INJECTION 61%  COMPARISON:  Chest CT 03/16/2016.  FINDINGS: Cardiovascular: Heart size is normal. Trace amount of pericardial fluid and/or thickening, unlikely to be of any hemodynamic significance at this time. No associated pericardial calcification. There is aortic atherosclerosis, as well as atherosclerosis of the great vessels of the mediastinum and the coronary arteries, including calcified atherosclerotic plaque in the left anterior descending and right coronary arteries.  Mediastinum/Nodes: No pathologically enlarged mediastinal or hilar lymph nodes. Esophagus is  unremarkable in appearance. No axillary lymphadenopathy. Heterogeneity throughout the thyroid gland with multiple small thyroid nodules, similar to prior examinations, presumably a goiter.  Lungs/Pleura: Status post left upper lobectomy. Compensatory hyperexpansion of the left lower lobe. Persistent nodular scarring in the periphery of the left lower lobe is very similar to the prior study, measuring 1.7 x 1.1 cm on today's examination (unchanged) on image 99 of series 2. Mild linear scarring also noted in the medial segment of the right middle lobe and in the basal segments of the right lower lobe. No definite new suspicious appearing pulmonary nodules or masses. There is no acute consolidative airspace disease. No pleural effusions. Diffuse bronchial wall thickening with mild to moderate centrilobular and paraseptal emphysema.  Upper Abdomen: Aortic atherosclerosis.  Musculoskeletal: There are no aggressive appearing lytic or blastic lesions noted in the visualized portions of the skeleton.  IMPRESSION: 1. Stable postoperative findings following left upper lobectomy, with chronic scarring in the periphery of the left lower lobe which is unchanged compared to the prior examination. No definite evidence to suggest local recurrence of disease or metastatic disease in the thorax on today's examination. 2. Mild diffuse bronchial wall thickening with mild to moderate centrilobular and paraseptal emphysema; imaging findings suggestive of underlying COPD. 3. Aortic atherosclerosis, in addition to 2 vessel coronary artery disease. Assessment for potential risk factor modification, dietary therapy or pharmacologic therapy may be warranted, if clinically indicated.   Electronically Signed   By: DVinnie LangtonM.D.   On: 06/10/2016 16:15 I personally reviewed the CT chest and concur with the findings noted above  Impression: [name] a 75year old woman who is one year  out from a thoracoscopic left upper lobectomy for a stage IB adenocarcinoma. She is doing well at this point in time with no evidence of recurrent disease.  Primary complaint presently is cramping. She was found to be hypokalemic and has been started on potassium supplement. She is still having some cramps. She has a follow-up appoint with Dr. MJulien Nordmannin about 2 weeks and also has been following with Dr. SBaird Cancer  Blood pressure- her blood pressure was markedly elevated when she saw Dr. MJulien Nordmanna couple of weeks ago. It was elevated again today but to a much lesser extent. I advised her to follow-up with Dr. SBaird Cancerregarding that issue.  Plan: Return in one year for 2 year follow-up visit.  SMelrose Nakayama MD [facility] [phone]

## 2016-06-24 ENCOUNTER — Encounter (HOSPITAL_COMMUNITY)
Admission: RE | Admit: 2016-06-24 | Discharge: 2016-06-24 | Disposition: A | Discharge status: Home / Self Care | Payer: Commercial Managed Care - HMO | Source: Ambulatory Visit | Attending: Pulmonary Disease | Admitting: Pulmonary Disease

## 2016-06-24 VITALS — Wt 146.8 lb

## 2016-06-24 DIAGNOSIS — C3492 Malignant neoplasm of unspecified part of left bronchus or lung: Secondary | ICD-10-CM

## 2016-06-24 DIAGNOSIS — J449 Chronic obstructive pulmonary disease, unspecified: Secondary | ICD-10-CM

## 2016-06-24 NOTE — Progress Notes (Signed)
Daily Session Note  Patient Details  Name: [name] MRN: [id number] Date of Birth: 1940/11/20 Referring Provider:   April Manson Pulmonary Rehab Walk Test from 06/08/2016 in [place]  Referring Provider  Dr. Elsworth Soho      Encounter Date: 06/24/2016  Check In:     Session Check In - 06/24/16 1330      Check-In   Location [facility]   Staff Present Rosebud Poles, RN, BSN;Molly diVincenzo, MS, ACSM RCEP, Exercise Physiologist;Portia Rollene Rotunda, RN, BSN   Supervising physician immediately available to respond to emergencies [facility] immediately available   Physician(s) Dr. Rockne Menghini   Medication changes reported     No   Fall or balance concerns reported    No   Warm-up and Cool-down Performed as group-led instruction   Resistance Training Performed Yes   VAD Patient? No     Pain Assessment   Currently in Pain? No/denies   Multiple Pain Sites No      Capillary Blood Glucose: No results found for this or any previous visit (from the past 24 hour(s)).      Exercise Prescription Changes - 06/24/16 1600      Response to Exercise   Blood Pressure (Admit) 100/60   Blood Pressure (Exercise) 116/60   Blood Pressure (Exit) 110/66   Heart Rate (Admit) 83 bpm   Heart Rate (Exercise) 120 bpm   Heart Rate (Exit) 100 bpm   Oxygen Saturation (Admit) 94 %   Oxygen Saturation (Exercise) 93 %   Oxygen Saturation (Exit) 97 %   Rating of Perceived Exertion (Exercise) 13   Perceived Dyspnea (Exercise) 2   Duration Progress to 45 minutes of aerobic exercise without signs/symptoms of physical distress   Intensity THRR unchanged     Progression   Progression Continue to progress workloads to maintain intensity without signs/symptoms of physical distress.     Resistance Training   Training Prescription Yes   Weight green bands   Reps 10-12  10 minutes of strength training     Interval Training   Interval Training No     Bike    Level 0.5   Minutes 17     Track   Laps 9   METs 17     Goals Met:  Exercise tolerated well Strength training completed today  Goals Unmet:  Not Applicable  Comments: Service time is from 1330 to 1515    Dr. Rush Farmer is Medical Director for Pulmonary Rehab at [facility].

## 2016-06-29 ENCOUNTER — Encounter (HOSPITAL_COMMUNITY): Admission: RE | Admit: 2016-06-29 | Payer: Commercial Managed Care - HMO | Source: Ambulatory Visit

## 2016-06-30 DIAGNOSIS — Z803 Family history of malignant neoplasm of breast: Secondary | ICD-10-CM | POA: Diagnosis not present

## 2016-06-30 DIAGNOSIS — Z1231 Encounter for screening mammogram for malignant neoplasm of breast: Secondary | ICD-10-CM | POA: Diagnosis not present

## 2016-07-01 ENCOUNTER — Encounter (HOSPITAL_COMMUNITY)
Admission: RE | Admit: 2016-07-01 | Discharge: 2016-07-01 | Disposition: A | Discharge status: Home / Self Care | Payer: Commercial Managed Care - HMO | Source: Ambulatory Visit | Attending: Pulmonary Disease | Admitting: Pulmonary Disease

## 2016-07-01 VITALS — Wt 149.5 lb

## 2016-07-01 DIAGNOSIS — J449 Chronic obstructive pulmonary disease, unspecified: Secondary | ICD-10-CM

## 2016-07-01 DIAGNOSIS — C3492 Malignant neoplasm of unspecified part of left bronchus or lung: Secondary | ICD-10-CM

## 2016-07-01 DIAGNOSIS — Z79899 Other long term (current) drug therapy: Secondary | ICD-10-CM | POA: Diagnosis not present

## 2016-07-01 NOTE — Progress Notes (Signed)
Daily Session Note  Patient Details  Name: [name] MRN: [id number] Date of Birth: 1941-06-07 Referring Provider:   April Manson Pulmonary Rehab Walk Test from 06/08/2016 in [place]  Referring Provider  Dr. Elsworth Soho      Encounter Date: 07/01/2016  Check In:     Session Check In - 07/01/16 1351      Check-In   Location [facility]   Staff Present Rosebud Poles, RN, BSN;Molly diVincenzo, MS, ACSM RCEP, Exercise Physiologist;Lisa Ysidro Evert, RN;Portia Rollene Rotunda, RN, BSN   Supervising physician immediately available to respond to emergencies [facility] immediately available   Physician(s) Dr. Alfredia Ferguson   Medication changes reported     No   Fall or balance concerns reported    No   Warm-up and Cool-down Performed as group-led instruction   Resistance Training Performed Yes   VAD Patient? No     Pain Assessment   Currently in Pain? No/denies   Multiple Pain Sites No      Capillary Blood Glucose: No results found for this or any previous visit (from the past 24 hour(s)).      Exercise Prescription Changes - 07/01/16 1600      Exercise Review   Progression Yes     Response to Exercise   Blood Pressure (Admit) 110/54   Blood Pressure (Exercise) 112/60   Blood Pressure (Exit) 110/60   Heart Rate (Admit) 102 bpm   Heart Rate (Exercise) 118 bpm   Heart Rate (Exit) 80 bpm   Oxygen Saturation (Admit) 92 %   Oxygen Saturation (Exercise) 95 %   Oxygen Saturation (Exit) 97 %   Rating of Perceived Exertion (Exercise) 11   Perceived Dyspnea (Exercise) 1   Duration Progress to 45 minutes of aerobic exercise without signs/symptoms of physical distress   Intensity THRR unchanged     Progression   Progression Continue to progress workloads to maintain intensity without signs/symptoms of physical distress.     Resistance Training   Training Prescription Yes   Weight green bands   Reps 10-12  10 minutes of strength training      Interval Training   Interval Training No     NuStep   Level 3   Minutes 17   METs 1.8     Track   Laps 11   METs 17     Goals Met:  Exercise tolerated well Strength training completed today  Goals Unmet:  Not Applicable  Comments: Service time is from 1330 to 1530. Attended the question and answer session with Dr. Nelda Marseille.    Dr. Rush Farmer is Medical Director for Pulmonary Rehab at [facility].

## 2016-07-06 ENCOUNTER — Encounter (HOSPITAL_COMMUNITY)
Admission: RE | Admit: 2016-07-06 | Discharge: 2016-07-06 | Disposition: A | Discharge status: Home / Self Care | Payer: Commercial Managed Care - HMO | Source: Ambulatory Visit | Attending: Pulmonary Disease | Admitting: Pulmonary Disease

## 2016-07-06 VITALS — Wt 148.1 lb

## 2016-07-06 DIAGNOSIS — J449 Chronic obstructive pulmonary disease, unspecified: Secondary | ICD-10-CM | POA: Diagnosis not present

## 2016-07-06 DIAGNOSIS — C3492 Malignant neoplasm of unspecified part of left bronchus or lung: Secondary | ICD-10-CM

## 2016-07-06 NOTE — Progress Notes (Signed)
Daily Session Note  Patient Details  Name: [name] MRN: [id number] Date of Birth: 17-Sep-1940 Referring Provider:   April Manson Pulmonary Rehab Walk Test from 06/08/2016 in [place]  Referring Provider  Dr. Elsworth Soho      Encounter Date: 07/06/2016  Check In:     Session Check In - 07/06/16 1330      Check-In   Location [facility]   Staff Present Rosebud Poles, RN, BSN;Molly diVincenzo, MS, ACSM RCEP, Exercise Physiologist;Lisa Ysidro Evert, RN;Portia Rollene Rotunda, RN, BSN   Supervising physician immediately available to respond to emergencies [facility] immediately available   Physician(s) Dr. Alfredia Ferguson   Medication changes reported     No   Fall or balance concerns reported    No   Warm-up and Cool-down Performed as group-led instruction   Resistance Training Performed Yes   VAD Patient? No     Pain Assessment   Currently in Pain? No/denies   Multiple Pain Sites No      Capillary Blood Glucose: No results found for this or any previous visit (from the past 24 hour(s)).      Exercise Prescription Changes - 07/06/16 1600      Exercise Review   Progression Yes     Response to Exercise   Blood Pressure (Admit) 106/60   Blood Pressure (Exercise) 130/60   Blood Pressure (Exit) 110/60   Heart Rate (Admit) 85 bpm   Heart Rate (Exercise) 117 bpm   Heart Rate (Exit) 93 bpm   Oxygen Saturation (Admit) 91 %   Oxygen Saturation (Exercise) 94 %   Oxygen Saturation (Exit) 92 %   Rating of Perceived Exertion (Exercise) 13   Perceived Dyspnea (Exercise) 1   Duration Progress to 45 minutes of aerobic exercise without signs/symptoms of physical distress   Intensity THRR unchanged     Progression   Progression Continue to progress workloads to maintain intensity without signs/symptoms of physical distress.     Resistance Training   Training Prescription Yes   Weight green bands   Reps 10-12  10 minutes of strength training      Interval Training   Interval Training No     Bike   Level 0.5   Minutes 17     NuStep   Level 4   Minutes 17   METs 2.5     Track   Laps 12   METs 17     Goals Met:  Exercise tolerated well Strength training completed today  Goals Unmet:  Not Applicable  Comments: Service time is from 1330 to 1515    Dr. Rush Farmer is Medical Director for Pulmonary Rehab at [facility]

## 2016-07-08 ENCOUNTER — Encounter (HOSPITAL_COMMUNITY)
Admission: RE | Admit: 2016-07-08 | Discharge: 2016-07-08 | Disposition: A | Discharge status: Home / Self Care | Payer: Commercial Managed Care - HMO | Source: Ambulatory Visit | Attending: Pulmonary Disease | Admitting: Pulmonary Disease

## 2016-07-08 VITALS — Wt 149.3 lb

## 2016-07-08 DIAGNOSIS — C3492 Malignant neoplasm of unspecified part of left bronchus or lung: Secondary | ICD-10-CM

## 2016-07-08 DIAGNOSIS — J449 Chronic obstructive pulmonary disease, unspecified: Secondary | ICD-10-CM | POA: Diagnosis not present

## 2016-07-08 NOTE — Progress Notes (Signed)
Daily Session Note  Patient Details  Name: [name] MRN: [id number] Date of Birth: [birth year] Referring Provider:   April Manson Pulmonary Rehab Walk Test from 06/08/2016 in [place]  Referring Provider  Dr. Elsworth Soho      Encounter Date: 07/08/2016  Check In:     Session Check In - 07/08/16 1330      Check-In   Location [facility]   Staff Present Rosebud Poles, RN, BSN;Molly diVincenzo, MS, ACSM RCEP, Exercise Physiologist;Lisa Ysidro Evert, RN;Zuri Lascala Rollene Rotunda, RN, BSN   Supervising physician immediately available to respond to emergencies [facility] immediately available   Physician(s) Dr. Elder Love   Medication changes reported     No   Fall or balance concerns reported    No   Warm-up and Cool-down Performed as group-led instruction   Resistance Training Performed Yes   VAD Patient? No     Pain Assessment   Currently in Pain? No/denies   Multiple Pain Sites No      Capillary Blood Glucose: No results found for this or any previous visit (from the past 24 hour(s)).      Exercise Prescription Changes - 07/08/16 1628      Response to Exercise   Blood Pressure (Admit) 108/64   Blood Pressure (Exercise) 126/70   Blood Pressure (Exit) 120/64   Heart Rate (Admit) 72 bpm   Heart Rate (Exercise) 103 bpm   Heart Rate (Exit) 94 bpm   Oxygen Saturation (Admit) 97 %   Oxygen Saturation (Exercise) 92 %   Oxygen Saturation (Exit) 95 %   Rating of Perceived Exertion (Exercise) 15   Perceived Dyspnea (Exercise) 3   Duration Progress to 45 minutes of aerobic exercise without signs/symptoms of physical distress   Intensity THRR unchanged     Progression   Progression Continue to progress workloads to maintain intensity without signs/symptoms of physical distress.     Resistance Training   Training Prescription Yes   Weight green bands   Reps 10-12  10 minutes of strength training     Interval Training   Interval  Training No     Bike   Level 0.5   Minutes 17     NuStep   Level 4   Minutes 17   METs 2.5     Goals Met:  Exercise tolerated well Queuing for purse lip breathing No report of cardiac concerns or symptoms Strength training completed today  Goals Unmet:  Not Applicable  Comments: Service time is from 1330 to 1540   Dr. Rush Farmer is Medical Director for Pulmonary Rehab at [facility].

## 2016-07-13 ENCOUNTER — Encounter (HOSPITAL_COMMUNITY)
Admission: RE | Admit: 2016-07-13 | Discharge: 2016-07-13 | Disposition: A | Discharge status: Home / Self Care | Payer: Commercial Managed Care - HMO | Source: Ambulatory Visit | Attending: Pulmonary Disease | Admitting: Pulmonary Disease

## 2016-07-13 DIAGNOSIS — J449 Chronic obstructive pulmonary disease, unspecified: Secondary | ICD-10-CM

## 2016-07-13 DIAGNOSIS — C3492 Malignant neoplasm of unspecified part of left bronchus or lung: Secondary | ICD-10-CM

## 2016-07-13 NOTE — Progress Notes (Signed)
Pulmonary Individual Treatment Plan  Patient Details  Name: [name] MRN: [id number] Date of Birth: 02-19-1941 Referring Provider:   April Manson Pulmonary Rehab Walk Test from 06/08/2016 in [place]  Referring Provider  Dr. Elsworth Soho      Initial Encounter Date:  Flowsheet Row Pulmonary Rehab Walk Test from 06/08/2016 in [place]  Date  06/08/16  Referring Provider  Dr. Elsworth Soho      Visit Diagnosis: Chronic obstructive pulmonary disease, unspecified COPD type ([place])  Adenocarcinoma of left lung ([place])  Patient's Home Medications on Admission:   Current Outpatient Prescriptions:  .  albuterol (PROVENTIL HFA;VENTOLIN HFA) 108 (90 Base) MCG/ACT inhaler, Inhale 2 puffs into the lungs every 6 (six) hours as needed for wheezing or shortness of breath., Disp: 1 Inhaler, Rfl: 11 .  aspirin 81 MG tablet, Take 81 mg by mouth every evening. , Disp: , Rfl:  .  donepezil (ARICEPT) 10 MG tablet, Take 10 mg by mouth every evening., Disp: , Rfl: 2 .  ezetimibe (ZETIA) 10 MG tablet, Take 10 mg by mouth at bedtime. , Disp: , Rfl:  .  Melatonin 2.5 MG CAPS, Take 1 capsule by mouth at bedtime as needed., Disp: , Rfl:  .  mirtazapine (REMERON) 15 MG tablet, Take 7.5 mg by mouth at bedtime. , Disp: , Rfl: 0 .  niacin (NIASPAN) 500 MG CR tablet, Take 500 mg by mouth every evening. , Disp: , Rfl:  .  OVER THE COUNTER MEDICATION, Take 1 capsule by mouth daily as needed., Disp: , Rfl:  .  pantoprazole (PROTONIX) 40 MG tablet, Take 40 mg by mouth 2 (two) times daily., Disp: , Rfl:  .  potassium chloride SA (K-DUR,KLOR-CON) 20 MEQ tablet, Take 1 tablet (20 mEq total) by mouth 2 (two) times daily., Disp: 14 tablet, Rfl: 0 .  RESTASIS 0.05 % ophthalmic emulsion, Place 1 drop into both eyes., Disp: , Rfl: 4 .  Tiotropium Bromide-Olodaterol (STIOLTO RESPIMAT) 2.5-2.5 MCG/ACT AERS, Inhale 2 puffs into the lungs daily., Disp: 1 Inhaler, Rfl: 5 .   valACYclovir (VALTREX) 500 MG tablet, Take 500 mg by mouth daily., Disp: , Rfl:  .  White Petrolatum-Mineral Oil (SYSTANE NIGHTTIME OP), Apply 1 drop to eye at bedtime as needed., Disp: , Rfl:   Past Medical History: Past Medical History:  Diagnosis Date  . Arthritis   . Asthma   . COPD (chronic obstructive pulmonary disease) ([place])   . Depression   . GERD (gastroesophageal reflux disease)   . Hyperlipidemia   . Hypertension 06/15/2016  . Shortness of breath   . Thyroid nodule     Tobacco Use: History  Smoking Status  . Former Smoker  . Packs/day: 1.00  . Years: 54.00  . Types: Cigarettes  Smokeless Tobacco  . Former Systems developer  . Quit date: 07/04/2015    Comment: Currently smoking 5-6 cigarettes per day.    Labs: Recent Review Flowsheet Data    Labs for ITP Cardiac and Pulmonary Rehab Latest Ref Rng & Units 03/02/2008 07/02/2015 07/05/2015   PHART 7.350 - 7.450 - 7.433 7.380   PCO2ART 35.0 - 45.0 mmHg - 38.3 44.9   HCO3 20.0 - 24.0 mEq/L - 25.1(H) 26.7(H)   TCO2 0 - 100 mmol/L 29 26.3 28   O2SAT % - 94.7 96.0      Capillary Blood Glucose: Lab Results  Component Value Date   GLUCAP 90 07/12/2015   GLUCAP 158 (H) 07/06/2015  GLUCAP 106 (H) 07/06/2015   GLUCAP 115 (H) 06/05/2015     ADL UCSD:     Pulmonary Assessment Scores    Row Name 06/09/16 1632         ADL UCSD   ADL Phase Entry     SOB Score total 37       CAT Score   CAT Score 26        Pulmonary Function Assessment:     Pulmonary Function Assessment - 06/04/16 1437      Breath   Bilateral Breath Sounds Decreased;Rhonchi  deminished on right, scattered rhonci in left   Shortness of Breath Limiting activity      Exercise Target Goals:    Exercise Program Goal: Individual exercise prescription set with THRR, safety & activity barriers. Participant demonstrates ability to understand and report RPE using BORG scale, to self-measure pulse accurately, and to acknowledge the importance of the  exercise prescription.  Exercise Prescription Goal: Starting with aerobic activity 30 plus minutes a day, 3 days per week for initial exercise prescription. Provide home exercise prescription and guidelines that participant acknowledges understanding prior to discharge.  Activity Barriers & Risk Stratification:     Activity Barriers & Cardiac Risk Stratification - 06/04/16 1436      Activity Barriers & Cardiac Risk Stratification   Activity Barriers Back Problems;Shortness of Breath      6 Minute Walk:     6 Minute Walk    Row Name 06/08/16 1653         6 Minute Walk   Phase Initial     Distance 900 feet     Walk Time 4.5 minutes     # of Rest Breaks 1  1 minute and 10 seconds     MPH 1.7     METS 2.3     RPE 13     Perceived Dyspnea  3     Symptoms Yes (comment)     Comments lower back pain 3-4/10     Resting HR 92 bpm     Resting BP 142/74     Max Ex. HR 119 bpm     Max Ex. BP 190/86     2 Minute Post BP 144/80       Interval HR   Baseline HR 92     1 Minute HR 105     2 Minute HR 116     3 Minute HR 107     4 Minute HR 110     5 Minute HR 116     6 Minute HR 119     2 Minute Post HR 93     Interval Heart Rate? Yes       Interval Oxygen   Interval Oxygen? Yes     Baseline Oxygen Saturation % 91 %     Baseline Liters of Oxygen 0 L     1 Minute Oxygen Saturation % 93 %     1 Minute Liters of Oxygen 0 L     2 Minute Oxygen Saturation % 90 %     2 Minute Liters of Oxygen 0 L     3 Minute Oxygen Saturation % 92 %     3 Minute Liters of Oxygen 0 L     4 Minute Oxygen Saturation % 93 %     4 Minute Liters of Oxygen 0 L     5 Minute Oxygen Saturation % 91 %     5 Minute  Liters of Oxygen 0 L     6 Minute Oxygen Saturation % 91 %     6 Minute Liters of Oxygen 0 L     2 Minute Post Oxygen Saturation % 97 %     2 Minute Post Liters of Oxygen 0 L        Initial Exercise Prescription:     Initial Exercise Prescription - 06/08/16 1600      Date of  Initial Exercise RX and Referring Provider   Date 06/08/16   Referring Provider Dr. Elsworth Soho     Bike   Level 0.5   Minutes 17     NuStep   Level 2   Minutes 17   METs 1.5     Track   Laps 5   METs 17     Prescription Details   Frequency (times per week) 2   Duration Progress to 45 minutes of aerobic exercise without signs/symptoms of physical distress     Intensity   THRR 40-80% of Max Heartrate 58-116   Ratings of Perceived Exertion 11-13   Perceived Dyspnea 0-4     Progression   Progression Continue progressive overload as per policy without signs/symptoms or physical distress.     Resistance Training   Training Prescription Yes   Weight GREEN BANDS   Reps 10-12      Perform Capillary Blood Glucose checks as needed.  Exercise Prescription Changes:     Exercise Prescription Changes    Row Name 06/17/16 1500 06/22/16 1500 06/24/16 1600 07/01/16 1600 07/06/16 1600     Exercise Review   Progression  -  -  - Yes Yes     Response to Exercise   Blood Pressure (Admit) 118/64 166/70 100/60 110/54 106/60   Blood Pressure (Exercise) 130/70 164/80 116/60 112/60 130/60   Blood Pressure (Exit) 124/80 132/72 110/66 110/60 110/60   Heart Rate (Admit) 65 bpm 98 bpm 83 bpm 102 bpm 85 bpm   Heart Rate (Exercise) 87 bpm 135 bpm 120 bpm 118 bpm 117 bpm   Heart Rate (Exit) 87 bpm 110 bpm 100 bpm 80 bpm 93 bpm   Oxygen Saturation (Admit) 94 % 94 % 94 % 92 % 91 %   Oxygen Saturation (Exercise) 97 % 91 % 93 % 95 % 94 %   Oxygen Saturation (Exit) 94 % 95 % 97 % 97 % 92 %   Rating of Perceived Exertion (Exercise) '13 13 13 11 13   '$ Perceived Dyspnea (Exercise) '2 2 2 1 1   '$ Duration Progress to 45 minutes of aerobic exercise without signs/symptoms of physical distress Progress to 45 minutes of aerobic exercise without signs/symptoms of physical distress Progress to 45 minutes of aerobic exercise without signs/symptoms of physical distress Progress to 45 minutes of aerobic exercise without  signs/symptoms of physical distress Progress to 45 minutes of aerobic exercise without signs/symptoms of physical distress   Intensity Other (comment)  40-80% of THRR Other (comment) THRR unchanged THRR unchanged THRR unchanged     Progression   Progression Continue to progress workloads to maintain intensity without signs/symptoms of physical distress. Continue to progress workloads to maintain intensity without signs/symptoms of physical distress. Continue to progress workloads to maintain intensity without signs/symptoms of physical distress. Continue to progress workloads to maintain intensity without signs/symptoms of physical distress. Continue to progress workloads to maintain intensity without signs/symptoms of physical distress.     Resistance Training   Training Prescription Yes Yes Yes Yes Yes  Weight green bands green bands green bands green bands green bands   Reps 10-12  10 minutes of strength training 10-12  10 minutes of strength training 10-12  10 minutes of strength training 10-12  10 minutes of strength training 10-12  10 minutes of strength training     Interval Training   Interval Training  - No No No No     Bike   Level  - 0.5 0.5  - 0.5   Minutes  - 17 17  - 17     NuStep   Level 2 2  - 3 4   Minutes 17 17  - 17 17   METs 1.8 2  - 1.8 2.5     Track   Laps '11 7 9 11 12   '$ METs '17 17 17 17 17   '$ Row Name 07/08/16 1628             Response to Exercise   Blood Pressure (Admit) 108/64       Blood Pressure (Exercise) 126/70       Blood Pressure (Exit) 120/64       Heart Rate (Admit) 72 bpm       Heart Rate (Exercise) 103 bpm       Heart Rate (Exit) 94 bpm       Oxygen Saturation (Admit) 97 %       Oxygen Saturation (Exercise) 92 %       Oxygen Saturation (Exit) 95 %       Rating of Perceived Exertion (Exercise) 15       Perceived Dyspnea (Exercise) 3       Duration Progress to 45 minutes of aerobic exercise without signs/symptoms of physical distress        Intensity THRR unchanged         Progression   Progression Continue to progress workloads to maintain intensity without signs/symptoms of physical distress.         Resistance Training   Training Prescription Yes       Weight green bands       Reps 10-12  10 minutes of strength training         Interval Training   Interval Training No         Bike   Level 0.5       Minutes 17         NuStep   Level 4       Minutes 17       METs 2.5          Exercise Comments:     Exercise Comments    Row Name 07/12/16 1029           Exercise Comments Patient is progressing in program. Patient works hard when she is here. Will cont. to monitor and progress.           Discharge Exercise Prescription (Final Exercise Prescription Changes):     Exercise Prescription Changes - 07/08/16 1628      Response to Exercise   Blood Pressure (Admit) 108/64   Blood Pressure (Exercise) 126/70   Blood Pressure (Exit) 120/64   Heart Rate (Admit) 72 bpm   Heart Rate (Exercise) 103 bpm   Heart Rate (Exit) 94 bpm   Oxygen Saturation (Admit) 97 %   Oxygen Saturation (Exercise) 92 %   Oxygen Saturation (Exit) 95 %   Rating of Perceived Exertion (Exercise) 15   Perceived Dyspnea (Exercise) 3   Duration  Progress to 45 minutes of aerobic exercise without signs/symptoms of physical distress   Intensity THRR unchanged     Progression   Progression Continue to progress workloads to maintain intensity without signs/symptoms of physical distress.     Resistance Training   Training Prescription Yes   Weight green bands   Reps 10-12  10 minutes of strength training     Interval Training   Interval Training No     Bike   Level 0.5   Minutes 17     NuStep   Level 4   Minutes 17   METs 2.5       Nutrition:  Target Goals: Understanding of nutrition guidelines, daily intake of sodium '1500mg'$ , cholesterol '200mg'$ , calories 30% from fat and 7% or less from saturated fats, daily to have  5 or more servings of fruits and vegetables.  Biometrics:    Nutrition Therapy Plan and Nutrition Goals:   Nutrition Discharge: Rate Your Plate Scores:   Psychosocial: Target Goals: Acknowledge presence or absence of depression, maximize coping skills, provide positive support system. Participant is able to verbalize types and ability to use techniques and skills needed for reducing stress and depression.  Initial Review & Psychosocial Screening:     Initial Psych Review & Screening - 06/04/16 1443      Initial Review   Current issues with History of Depression     Family Dynamics   Good Support System? Yes     Barriers   Psychosocial barriers to participate in program There are no identifiable barriers or psychosocial needs.     Screening Interventions   Interventions Encouraged to exercise      Quality of Life Scores:     Quality of Life - 06/09/16 1633      Quality of Life Scores   Health/Function Pre 27.2 %   Socioeconomic Pre 26 %   Psych/Spiritual Pre 26.57 %   Family Pre 28.13 %   GLOBAL Pre 26.87 %      PHQ-9: Recent Review Flowsheet Data    Depression screen [facility] 2/9 06/04/2016 08/04/2015   Decreased Interest 0 0   Down, Depressed, Hopeless 0 0   PHQ - 2 Score 0 0      Psychosocial Evaluation and Intervention:     Psychosocial Evaluation - 06/04/16 1448      Psychosocial Evaluation & Interventions   Interventions Encouraged to exercise with the program and follow exercise prescription      Psychosocial Re-Evaluation:     Psychosocial Re-Evaluation    [place] Name 07/06/16 1150             Psychosocial Re-Evaluation   Interventions Encouraged to attend Pulmonary Rehabilitation for the exercise       Comments No psychosocial issues identified at this time       Continued Psychosocial Services Needed No         Education: Education Goals: Education classes will be provided on a weekly basis, covering required topics. Participant will  state understanding/return demonstration of topics presented.  Learning Barriers/Preferences:     Learning Barriers/Preferences - 06/04/16 1436      Learning Barriers/Preferences   Learning Barriers None   Learning Preferences Computer/Internet;Group Instruction;Individual Instruction;Skilled Demonstration;Verbal Instruction;Written Material      Education Topics: Risk Factor Reduction:  -Group instruction that is supported by a PowerPoint presentation. Instructor discusses the definition of a risk factor, different risk factors for pulmonary disease, and how the heart and lungs work together.  Nutrition for Pulmonary Patient:  -Group instruction provided by PowerPoint slides, verbal discussion, and written materials to support subject matter. The instructor gives an explanation and review of healthy diet recommendations, which includes a discussion on weight management, recommendations for fruit and vegetable consumption, as well as protein, fluid, caffeine, fiber, sodium, sugar, and alcohol. Tips for eating when patients are short of breath are discussed.   Pursed Lip Breathing:  -Group instruction that is supported by demonstration and informational handouts. Instructor discusses the benefits of pursed lip and diaphragmatic breathing and detailed demonstration on how to preform both.   Flowsheet Row PULMONARY REHAB OTHER RESPIRATORY from 07/08/2016 in [place]  Date  06/17/16  Educator  EP  Instruction Review Code  2- meets goals/outcomes      Oxygen Safety:  -Group instruction provided by PowerPoint, verbal discussion, and written material to support subject matter. There is an overview of "What is Oxygen" and "Why do we need it".  Instructor also reviews how to create a safe environment for oxygen use, the importance of using oxygen as prescribed, and the risks of noncompliance. There is a brief discussion on traveling with oxygen and resources  the patient may utilize. Flowsheet Row PULMONARY REHAB OTHER RESPIRATORY from 07/08/2016 in [place]  Date  07/08/16  Educator  rn  Instruction Review Code  2- meets goals/outcomes      Oxygen Equipment:  -Group instruction provided by [employer] Staff utilizing handouts, written materials, and equipment demonstrations.   Signs and Symptoms:  -Group instruction provided by written material and verbal discussion to support subject matter. Warning signs and symptoms of infection, stroke, and heart attack are reviewed and when to call the physician/911 reinforced. Tips for preventing the spread of infection discussed.   Advanced Directives:  -Group instruction provided by verbal instruction and written material to support subject matter. Instructor reviews Advanced Directive laws and proper instruction for filling out document.   Pulmonary Video:  -Group video education that reviews the importance of medication and oxygen compliance, exercise, good nutrition, pulmonary hygiene, and pursed lip and diaphragmatic breathing for the pulmonary patient. Flowsheet Row PULMONARY REHAB OTHER RESPIRATORY from 07/08/2016 in [place]  Date  06/24/16  Educator  video  Instruction Review Code  2- meets goals/outcomes      Exercise for the Pulmonary Patient:  -Group instruction that is supported by a PowerPoint presentation. Instructor discusses benefits of exercise, core components of exercise, frequency, duration, and intensity of an exercise routine, importance of utilizing pulse oximetry during exercise, safety while exercising, and options of places to exercise outside of rehab.     Pulmonary Medications:  -Verbally interactive group education provided by instructor with focus on inhaled medications and proper administration.   Anatomy and Physiology of the Respiratory System and Intimacy:  -Group instruction provided by  PowerPoint, verbal discussion, and written material to support subject matter. Instructor reviews respiratory cycle and anatomical components of the respiratory system and their functions. Instructor also reviews differences in obstructive and restrictive respiratory diseases with examples of each. Intimacy, Sex, and Sexuality differences are reviewed with a discussion on how relationships can change when diagnosed with pulmonary disease. Common sexual concerns are reviewed.   Knowledge Questionnaire Score:     Knowledge Questionnaire Score - 06/09/16 1632      Knowledge Questionnaire Score   Pre Score 10/13      Core Components/Risk Factors/Patient Goals at Admission:  Personal Goals and Risk Factors at Admission - 06/04/16 1438      Core Components/Risk Factors/Patient Goals on Admission   Improve shortness of breath with ADL's Yes   Intervention Provide education, individualized exercise plan and daily activity instruction to help decrease symptoms of SOB with activities of daily living.   Expected Outcomes Short Term: Achieves a reduction of symptoms when performing activities of daily living.   Develop more efficient breathing techniques such as purse lipped breathing and diaphragmatic breathing; and practicing self-pacing with activity Yes   Intervention Provide education, demonstration and support about specific breathing techniuqes utilized for more efficient breathing. Include techniques such as pursed lipped breathing, diaphragmatic breathing and self-pacing activity.   Expected Outcomes Short Term: Participant will be able to demonstrate and use breathing techniques as needed throughout daily activities.   Increase knowledge of respiratory medications and ability to use respiratory devices properly  Yes   Intervention Provide education and demonstration as needed of appropriate use of medications, inhalers, and oxygen therapy.   Expected Outcomes Short Term: Achieves  understanding of medications use. Understands that oxygen is a medication prescribed by physician. Demonstrates appropriate use of inhaler and oxygen therapy.      Core Components/Risk Factors/Patient Goals Review:      Goals and Risk Factor Review    Row Name 07/06/16 1148             Core Components/Risk Factors/Patient Goals Review   Personal Goals Review Develop more efficient breathing techniques such as purse lipped breathing and diaphragmatic breathing and practicing self-pacing with activity.;Improve shortness of breath with ADL's;Increase knowledge of respiratory medications and ability to use respiratory devices properly.       Review just beginning program, has attended 4 exercise sessions, too early to meet program goals.       Expected Outcomes expect to see an improvement in goals in the next 30 days.          Core Components/Risk Factors/Patient Goals at Discharge (Final Review):      Goals and Risk Factor Review - 07/06/16 1148      Core Components/Risk Factors/Patient Goals Review   Personal Goals Review Develop more efficient breathing techniques such as purse lipped breathing and diaphragmatic breathing and practicing self-pacing with activity.;Improve shortness of breath with ADL's;Increase knowledge of respiratory medications and ability to use respiratory devices properly.   Review just beginning program, has attended 4 exercise sessions, too early to meet program goals.   Expected Outcomes expect to see an improvement in goals in the next 30 days.      ITP Comments:   Comments: ITP REVIEW Pt is making expected progress toward pulmonary rehab goals after completing 6 sessions. Recommend continued exercise, life style modification, education, and utilization of breathing techniques to increase stamina and strength and decrease shortness of breath with exertion.

## 2016-07-15 ENCOUNTER — Encounter (HOSPITAL_COMMUNITY): Payer: Commercial Managed Care - HMO

## 2016-07-16 ENCOUNTER — Encounter (HOSPITAL_COMMUNITY): Payer: Self-pay | Admitting: Nurse Practitioner

## 2016-07-16 ENCOUNTER — Emergency Department (HOSPITAL_COMMUNITY)
Admission: EM | Admit: 2016-07-16 | Discharge: 2016-07-16 | Disposition: A | Discharge status: Home / Self Care | Payer: Commercial Managed Care - HMO | Attending: Emergency Medicine | Admitting: Emergency Medicine

## 2016-07-16 ENCOUNTER — Emergency Department (HOSPITAL_COMMUNITY): Payer: Commercial Managed Care - HMO

## 2016-07-16 DIAGNOSIS — Z87891 Personal history of nicotine dependence: Secondary | ICD-10-CM | POA: Insufficient documentation

## 2016-07-16 DIAGNOSIS — Z7982 Long term (current) use of aspirin: Secondary | ICD-10-CM | POA: Diagnosis not present

## 2016-07-16 DIAGNOSIS — M62838 Other muscle spasm: Secondary | ICD-10-CM | POA: Diagnosis not present

## 2016-07-16 DIAGNOSIS — J449 Chronic obstructive pulmonary disease, unspecified: Secondary | ICD-10-CM | POA: Diagnosis not present

## 2016-07-16 DIAGNOSIS — Z85118 Personal history of other malignant neoplasm of bronchus and lung: Secondary | ICD-10-CM | POA: Diagnosis not present

## 2016-07-16 DIAGNOSIS — R0789 Other chest pain: Secondary | ICD-10-CM

## 2016-07-16 DIAGNOSIS — I10 Essential (primary) hypertension: Secondary | ICD-10-CM | POA: Insufficient documentation

## 2016-07-16 LAB — BASIC METABOLIC PANEL
ANION GAP: 7 (ref 5–15)
BUN: 11 mg/dL (ref 6–20)
CHLORIDE: 107 mmol/L (ref 101–111)
CO2: 26 mmol/L (ref 22–32)
Calcium: 9.6 mg/dL (ref 8.9–10.3)
Creatinine, Ser: 1.02 mg/dL — ABNORMAL HIGH (ref 0.44–1.00)
GFR calc Af Amer: >60 mL/min (ref >60)
GFR calc non Af Amer: 52 mL/min — ABNORMAL LOW (ref >60)
GLUCOSE: 96 mg/dL (ref 65–99)
POTASSIUM: 4.2 mmol/L (ref 3.5–5.1)
Sodium: 140 mmol/L (ref 135–145)

## 2016-07-16 LAB — I-STAT TROPONIN, ED
Troponin i, poc: 0 ng/mL (ref 0.00–0.08)
Troponin i, poc: 0 ng/mL (ref 0.00–0.08)

## 2016-07-16 LAB — CBC
HEMATOCRIT: 42 % (ref 36.0–46.0)
HEMOGLOBIN: 13.9 g/dL (ref 12.0–15.0)
MCH: 30.2 pg (ref 26.0–34.0)
MCHC: 33.1 g/dL (ref 30.0–36.0)
MCV: 91.1 fL (ref 78.0–100.0)
Platelets: 284 10*3/uL (ref 150–400)
RBC: 4.61 MIL/uL (ref 3.87–5.11)
RDW: 16.4 % — ABNORMAL HIGH (ref 11.5–15.5)
WBC: 6.4 10*3/uL (ref 4.0–10.5)

## 2016-07-16 MED ORDER — DIAZEPAM 5 MG PO TABS: 5.0000 mg | ORAL_TABLET | Freq: Once | ORAL | Status: DC

## 2016-07-16 MED ORDER — DIAZEPAM 5 MG/ML IJ SOLN: 5.0000 mg | Freq: Once | INTRAMUSCULAR | Status: DC

## 2016-07-16 MED ORDER — CYCLOBENZAPRINE HCL 5 MG PO TABS: 5.0000 mg | ORAL_TABLET | Freq: Three times a day (TID) | ORAL | 0 refills | Status: DC | PRN

## 2016-07-16 MED ORDER — KETOROLAC TROMETHAMINE 30 MG/ML IJ SOLN
30.0000 mg | Freq: Once | INTRAMUSCULAR | Status: AC
Start: 2016-07-16 — End: 2016-07-16
  Administered 2016-07-16: 30 mg via INTRAVENOUS
  Filled 2016-07-16: qty 1

## 2016-07-16 NOTE — ED Notes (Signed)
Patient given food per MD. Patient asked to hold on valium tablet until after she eats. Patient will let nurse know when she would like valium.

## 2016-07-16 NOTE — ED Triage Notes (Signed)
Pt presents with c/o CP. The pain began while she was sitting at rest at her brothers funeral today. The pain feels like a tightness and has been constant since onset. She reports some SOB which is normal to her from her COPD. She reports lightheadedness. She denies diaphoresis, nausea. She Is alert and breathing easily

## 2016-07-16 NOTE — ED Provider Notes (Signed)
[facility] DEPT Provider Note   CSN: [id number] Arrival date & time: 07/16/16  1553     History   Chief Complaint Chief Complaint  Patient presents with  . Chest Pain    HPI [name] is a 75 y.o. female hx of COPD, GERD, HL, HTN, Here presenting with chest pain. Patient was at her brother's funeral today and had an episode of left chest tightness. States that for the last several days she has been getting some chest tightness and muscle pain. She was thought to have hypokalemia and has been seeing her doctor and getting potassium supplementation. Denies any nausea or vomiting. Denies any fever or cough. Patient is not currently on a muscle relaxant. She has no history of CAD and no cardiac stents.    The history is provided by the patient.    Past Medical History:  Diagnosis Date  . Arthritis   . Asthma   . COPD (chronic obstructive pulmonary disease) ([place])   . Depression   . GERD (gastroesophageal reflux disease)   . Hyperlipidemia   . Hypertension 06/15/2016  . Shortness of breath   . Thyroid nodule     Patient Active Problem List   Diagnosis Date Noted  . Hypertension 06/15/2016  . Adenocarcinoma of left lung ([place]) 07/04/2015  . 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 Multinodular goiter (nontoxic) 07/18/2014  . Malnutrition of moderate degree ([place]) 05/24/2014  . Moderate malnutrition ([place]) 05/24/2014  . Other and unspecified hyperlipidemia 05/23/2014  . Rhinitis, allergic 11/22/2013  . COPD (chronic obstructive pulmonary disease) ([place]) 12/15/2012    Past Surgical History:  Procedure Laterality Date  . BIOPSY THYROID    . CATARACT EXTRACTION     left eye  . CATARACT EXTRACTION W/ INTRAOCULAR LENS IMPLANT     RIGHT  . CESAREAN SECTION    . EYE SURGERY    . MASS EXCISION N/A 12/13/2014   Procedure: EXCISION OF VOCAL CORD POLYPS;  Surgeon: Jerrell Belfast, MD;  Location: [facility];  Service: ENT;  Laterality: N/A;  . MICROLARYNGOSCOPY N/A  12/13/2014   Procedure: MICROLARYNGOSCOPY;  Surgeon: Jerrell Belfast, MD;  Location: [facility];  Service: ENT;  Laterality: N/A;  . TUBAL LIGATION    . VIDEO ASSISTED THORACOSCOPY (VATS)/ LOBECTOMY Left 07/04/2015   Procedure: VIDEO ASSISTED THORACOSCOPY (VATS) LEFT UPPER LOBECTOMY;  Surgeon: Melrose Nakayama, MD;  Location: [facility];  Service: Thoracic;  Laterality: Left;  Marland Kitchen VIDEO BRONCHOSCOPY WITH INSERTION OF INTERBRONCHIAL VALVE (IBV) N/A 07/11/2015   Procedure: VIDEO BRONCHOSCOPY WITH INSERTION OF INTERBRONCHIAL VALVE (IBV);  Surgeon: Melrose Nakayama, MD;  Location: [facility];  Service: Thoracic;  Laterality: N/A;  . VIDEO BRONCHOSCOPY WITH INSERTION OF INTERBRONCHIAL VALVE (IBV) N/A 09/10/2015   Procedure: VIDEO BRONCHOSCOPY WITH REMOVAL OF INTERBRONCHIAL VALVES;  Surgeon: Melrose Nakayama, MD;  Location: [facility];  Service: Thoracic;  Laterality: N/A;    OB History    No data available       Home Medications    Prior to Admission medications   Medication Sig Start Date End Date Taking? Authorizing Provider  albuterol (PROVENTIL HFA;VENTOLIN HFA) 108 (90 Base) MCG/ACT inhaler Inhale 2 puffs into the lungs every 6 (six) hours as needed for wheezing or shortness of breath. 06/18/16  Yes Rigoberto Noel, MD  aspirin 81 MG tablet Take 81 mg by mouth every evening.    Yes Historical Provider, MD  donepezil (ARICEPT) 10 MG tablet Take 10 mg by mouth  every evening. 03/27/16  Yes Historical Provider, MD  ezetimibe (ZETIA) 10 MG tablet Take 10 mg by mouth at bedtime.    Yes Historical Provider, MD  Melatonin 2.5 MG CAPS Take 1 capsule by mouth at bedtime as needed.   Yes Historical Provider, MD  mirtazapine (REMERON) 15 MG tablet Take 7.5 mg by mouth at bedtime.  08/08/14  Yes Historical Provider, MD  niacin (NIASPAN) 500 MG CR tablet Take 500 mg by mouth every evening.    Yes Historical Provider, MD  pantoprazole (PROTONIX) 40 MG tablet Take 40 mg by mouth 2 (two) times daily.   Yes Historical  Provider, MD  potassium chloride SA (K-DUR,KLOR-CON) 20 MEQ tablet Take 1 tablet (20 mEq total) by mouth 2 (two) times daily. 06/11/16  Yes Curt Bears, MD  RESTASIS 0.05 % ophthalmic emulsion Place 1 drop into both eyes. 03/10/16  Yes Historical Provider, MD  Tiotropium Bromide-Olodaterol (STIOLTO RESPIMAT) 2.5-2.5 MCG/ACT AERS Inhale 2 puffs into the lungs daily. 04/12/16  Yes Rigoberto Noel, MD  valACYclovir (VALTREX) 500 MG tablet Take 500 mg by mouth daily.   Yes Historical Provider, MD  White Petrolatum-Mineral Oil (SYSTANE NIGHTTIME OP) Place 1 drop into both eyes at bedtime as needed. For dry eyes   Yes Historical Provider, MD    Family History Family History  Problem Relation Age of Onset  . Renal cancer Brother   . Diabetes Mother   . Hypertension Mother   . Renal Disease Mother     Social History Social History  Substance Use Topics  . Smoking status: Former Smoker    Packs/day: 1.00    Years: 54.00    Types: Cigarettes  . Smokeless tobacco: Former Systems developer    Quit date: 07/04/2015     Comment: Currently smoking 5-6 cigarettes per day.  . Alcohol use No     Allergies   Patient has no known allergies.   Review of Systems Review of Systems  Cardiovascular: Positive for chest pain.  All other systems reviewed and are negative.    Physical Exam Updated Vital Signs BP 127/72   Pulse 78   Temp 97.9 F (36.6 C) (Oral)   Resp 19   SpO2 93%   Physical Exam  Constitutional: She is oriented to person, place, and time. She appears well-developed.  Slightly uncomfortable   HENT:  Head: Normocephalic.  Eyes: EOM are normal. Pupils are equal, round, and reactive to light.  Neck: Normal range of motion. Neck supple.  Cardiovascular: Normal rate, regular rhythm and normal heart sounds.   Pulmonary/Chest: Effort normal and breath sounds normal.  + reproducible L chest tenderness   Abdominal: Soft. Bowel sounds are normal. She exhibits no distension. There is no  tenderness.  Musculoskeletal: Normal range of motion.  Neurological: She is alert and oriented to person, place, and time. She displays normal reflexes. No cranial nerve deficit. Abnormal muscle tone: .id. Coordination normal.  Skin: Skin is warm.  Psychiatric: She has a normal mood and affect.  Nursing note and vitals reviewed.    ED Treatments / Results  Labs (all labs ordered are listed, but only abnormal results are displayed) Labs Reviewed  BASIC METABOLIC PANEL - Abnormal; Notable for the following:       Result Value   Creatinine, Ser 1.02 (*)    GFR calc non Af Amer 52 (*)    All other components within normal limits  CBC - Abnormal; Notable for the following:    RDW 16.4 (*)  All other components within normal limits  I-STAT TROPOININ, ED  I-STAT TROPOININ, ED    EKG  EKG Interpretation  Date/Time:  Friday July 16 2016 15:56:48 EST Ventricular Rate:  86 PR Interval:  178 QRS Duration: 82 QT Interval:  366 QTC Calculation: 437 R Axis:   73 Text Interpretation:  Normal sinus rhythm with sinus arrhythmia Possible Left atrial enlargement Borderline ECG No significant change since last tracing Confirmed by YAO  MD, DAVID ([id number]) on 07/16/2016 6:07:13 PM       Radiology Dg Chest 2 View  Result Date: 07/16/2016 CLINICAL DATA:  Left side muscle spasm EXAM: CHEST  2 VIEW COMPARISON:  09/23/2015 FINDINGS: Cardiomediastinal silhouette is stable. No infiltrate or pleural effusion. No pulmonary edema. Bony thorax is unremarkable. IMPRESSION: No active cardiopulmonary disease. Electronically Signed   By: Lahoma Crocker M.D.   On: 07/16/2016 17:07    Procedures Procedures (including critical care time)  Medications Ordered in ED Medications  diazepam (VALIUM) tablet 5 mg (not administered)  ketorolac (TORADOL) 30 MG/ML injection 30 mg (30 mg Intravenous Given 07/16/16 1858)     Initial Impression / Assessment and Plan / ED Course  I have reviewed the triage vital  signs and the nursing notes.  Pertinent labs & imaging results that were available during my care of the patient were reviewed by me and considered in my medical decision making (see chart for details).  Clinical Course     ONESTY CLAIR is a 75 y.o. female here with l chest pain that is reproducible. Likely muscle strain. She was in her brother's funeral and is under a lot of stress. Vitals stable. Well appearing. Will get delta trop, labs, CXR. Will give toradol, valium  8 pm Felt better with toradol. Didn't want valium. Trop neg x 2. CXR unremarkable. I doubt PE or dissection. Prescribed flexeril for muscle strain. Will dc home.    Final Clinical Impressions(s) / ED Diagnoses   Final diagnoses:  None    New Prescriptions New Prescriptions   No medications on file     Drenda Freeze, MD 07/16/16 2347

## 2016-07-16 NOTE — ED Notes (Signed)
ED Provider at bedside. 

## 2016-07-16 NOTE — Discharge Instructions (Signed)
Take motrin for pain.   Take flexeril for muscle spasms.   See your doctor  Return to ER if you have worse chest pain, shortness of breath, muscle spasms

## 2016-07-20 ENCOUNTER — Encounter (HOSPITAL_COMMUNITY): Payer: Commercial Managed Care - HMO

## 2016-07-22 ENCOUNTER — Encounter (HOSPITAL_COMMUNITY): Payer: Commercial Managed Care - HMO

## 2016-07-27 ENCOUNTER — Encounter (HOSPITAL_COMMUNITY): Payer: Commercial Managed Care - HMO

## 2016-07-29 ENCOUNTER — Encounter (HOSPITAL_COMMUNITY): Payer: Commercial Managed Care - HMO

## 2016-08-02 ENCOUNTER — Telehealth (HOSPITAL_COMMUNITY): Payer: Self-pay | Admitting: *Deleted

## 2016-08-03 ENCOUNTER — Encounter (HOSPITAL_COMMUNITY)
Admission: RE | Admit: 2016-08-03 | Discharge: 2016-08-03 | Disposition: A | Discharge status: Home / Self Care | Payer: Commercial Managed Care - HMO | Source: Ambulatory Visit | Attending: Pulmonary Disease | Admitting: Pulmonary Disease

## 2016-08-03 VITALS — Wt 148.4 lb

## 2016-08-03 DIAGNOSIS — C3492 Malignant neoplasm of unspecified part of left bronchus or lung: Secondary | ICD-10-CM

## 2016-08-03 DIAGNOSIS — J449 Chronic obstructive pulmonary disease, unspecified: Secondary | ICD-10-CM | POA: Diagnosis not present

## 2016-08-03 NOTE — Progress Notes (Signed)
Daily Session Note  Patient Details  Name: [name] MRN: [id number] Date of Birth: November 18, 1940 Referring Provider:   April Manson Pulmonary Rehab Walk Test from 06/08/2016 in [facility]  Referring Provider  Dr. Elsworth Soho      Encounter Date: 08/03/2016  Check In:     Session Check In - 08/03/16 1330      Check-In   Location [facility]   Staff Present Rosebud Poles, RN, BSN;Molly diVincenzo, MS, ACSM RCEP, Exercise Physiologist;Lisa Ysidro Evert, RN;Jireh Elmore Rollene Rotunda, RN, BSN   Supervising physician immediately available to respond to emergencies [facility] immediately available   Physician(s) Dr. Cathlean Sauer   Medication changes reported     No   Fall or balance concerns reported    No   Warm-up and Cool-down Performed as group-led instruction   Resistance Training Performed Yes   VAD Patient? No     Pain Assessment   Currently in Pain? No/denies   Multiple Pain Sites No      Capillary Blood Glucose: No results found for this or any previous visit (from the past 24 hour(s)).      Exercise Prescription Changes - 08/03/16 1553      Response to Exercise   Blood Pressure (Admit) 116/60   Blood Pressure (Exercise) 126/70   Blood Pressure (Exit) 108/78   Heart Rate (Admit) 85 bpm   Heart Rate (Exercise) 112 bpm   Heart Rate (Exit) 80 bpm   Oxygen Saturation (Admit) 97 %   Oxygen Saturation (Exercise) 92 %   Oxygen Saturation (Exit) 95 %   Rating of Perceived Exertion (Exercise) 13   Perceived Dyspnea (Exercise) 3   Duration Progress to 45 minutes of aerobic exercise without signs/symptoms of physical distress   Intensity THRR unchanged     Progression   Progression Continue to progress workloads to maintain intensity without signs/symptoms of physical distress.     Resistance Training   Training Prescription Yes   Weight green bands   Reps 10-12  10 minutes of strength training     Interval Training   Interval  Training No     Bike   Level 0.5   Minutes 17     NuStep   Level 4   Minutes 17   METs 2.5     Track   Laps 15   METs 17     Goals Met:  Using PLB without cueing & demonstrates good technique Exercise tolerated well No report of cardiac concerns or symptoms Strength training completed today  Goals Unmet:  Not Applicable  Comments: Service time is from 1330 to 1515   Dr. Rush Farmer is Medical Director for Pulmonary Rehab at [facility].

## 2016-08-05 ENCOUNTER — Encounter (HOSPITAL_COMMUNITY)
Admission: RE | Admit: 2016-08-05 | Discharge: 2016-08-05 | Disposition: A | Discharge status: Home / Self Care | Payer: Commercial Managed Care - HMO | Source: Ambulatory Visit | Attending: Pulmonary Disease | Admitting: Pulmonary Disease

## 2016-08-05 VITALS — Wt 146.8 lb

## 2016-08-05 DIAGNOSIS — J449 Chronic obstructive pulmonary disease, unspecified: Secondary | ICD-10-CM

## 2016-08-05 NOTE — Progress Notes (Signed)
Daily Session Note  Patient Details  Name: [name] MRN: [id number] Date of Birth: [birth year] Referring Provider:   April Manson Pulmonary Rehab Walk Test from 06/08/2016 in [facility]  Referring Provider  Dr. Elsworth Soho      Encounter Date: 08/05/2016  Check In:     Session Check In - 08/05/16 1330      Check-In   Location [facility]   Staff Present Rosebud Poles, RN, BSN;Molly diVincenzo, MS, ACSM RCEP, Exercise Physiologist;Lisa Ysidro Evert, RN;Kamyia Thomason Rollene Rotunda, RN, BSN   Supervising physician immediately available to respond to emergencies [facility] immediately available   Physician(s) Dr. Ree Kida   Medication changes reported     No   Fall or balance concerns reported    No   Warm-up and Cool-down Performed as group-led instruction   Resistance Training Performed Yes   VAD Patient? No     Pain Assessment   Currently in Pain? No/denies   Multiple Pain Sites No      Capillary Blood Glucose: No results found for this or any previous visit (from the past 24 hour(s)).      Exercise Prescription Changes - 08/05/16 1602      Response to Exercise   Blood Pressure (Admit) 112/60   Blood Pressure (Exercise) 120/70   Blood Pressure (Exit) 112/70   Heart Rate (Admit) 85 bpm   Heart Rate (Exercise) 93 bpm   Heart Rate (Exit) 77 bpm   Oxygen Saturation (Admit) 92 %   Oxygen Saturation (Exercise) 98 %   Oxygen Saturation (Exit) 99 %   Rating of Perceived Exertion (Exercise) 13   Perceived Dyspnea (Exercise) 3   Duration Progress to 45 minutes of aerobic exercise without signs/symptoms of physical distress   Intensity THRR unchanged     Progression   Progression Continue to progress workloads to maintain intensity without signs/symptoms of physical distress.     Resistance Training   Training Prescription Yes   Weight green bands   Reps 10-12  10 minutes of strength training     Interval Training   Interval  Training No     NuStep   Level 4   Minutes 17   METs 2.5     Track   Laps 16   METs 17     Goals Met:  Independence with exercise equipment Improved SOB with ADL's Using PLB without cueing & demonstrates good technique Exercise tolerated well No report of cardiac concerns or symptoms Strength training completed today  Goals Unmet:  Not Applicable  Comments: Service time is from 1330 to 1520   Dr. Rush Farmer is Medical Director for Pulmonary Rehab at [facility].

## 2016-08-10 ENCOUNTER — Encounter (HOSPITAL_COMMUNITY)
Admission: RE | Admit: 2016-08-10 | Discharge: 2016-08-10 | Disposition: A | Discharge status: Home / Self Care | Payer: Commercial Managed Care - HMO | Source: Ambulatory Visit | Attending: Pulmonary Disease | Admitting: Pulmonary Disease

## 2016-08-10 VITALS — Wt 148.1 lb

## 2016-08-10 DIAGNOSIS — J449 Chronic obstructive pulmonary disease, unspecified: Secondary | ICD-10-CM | POA: Diagnosis not present

## 2016-08-10 DIAGNOSIS — C3492 Malignant neoplasm of unspecified part of left bronchus or lung: Secondary | ICD-10-CM

## 2016-08-10 NOTE — Progress Notes (Signed)
Daily Session Note  Patient Details  Name: [name] MRN: [id number] Date of Birth: Aug 02, 1941 Referring Provider:   April Manson Pulmonary Rehab Walk Test from 06/08/2016 in [place] [facility]  Referring Provider  Dr. Elsworth Soho      Encounter Date: 08/10/2016  Check In:     Session Check In - 08/10/16 1354      Check-In   Location [facility]   Staff Present Trish Fountain, RN, BSN;Lisa Ysidro Evert, RN;Molly diVincenzo, MS, ACSM RCEP, Exercise Physiologist;Joan Leonia Reeves, RN, BSN   Supervising physician immediately available to respond to emergencies [facility] immediately available   Physician(s) Dr. Ree Kida   Medication changes reported     No   Fall or balance concerns reported    No   Warm-up and Cool-down Performed as group-led instruction   Resistance Training Performed Yes   VAD Patient? No     Pain Assessment   Currently in Pain? No/denies   Multiple Pain Sites No      Capillary Blood Glucose: No results found for this or any previous visit (from the past 24 hour(s)).      Exercise Prescription Changes - 08/10/16 1546      Response to Exercise   Blood Pressure (Admit) 98/52   Blood Pressure (Exercise) 112/60   Blood Pressure (Exit) 112/64   Heart Rate (Admit) 84 bpm   Heart Rate (Exercise) 108 bpm   Heart Rate (Exit) 85 bpm   Oxygen Saturation (Admit) 96 %   Oxygen Saturation (Exercise) 94 %   Oxygen Saturation (Exit) 93 %   Rating of Perceived Exertion (Exercise) 13   Perceived Dyspnea (Exercise) 2   Duration Progress to 45 minutes of aerobic exercise without signs/symptoms of physical distress   Intensity THRR unchanged     Progression   Progression Continue to progress workloads to maintain intensity without signs/symptoms of physical distress.     Resistance Training   Training Prescription Yes   Weight green bands   Reps 10-12  10 minutes of strength training     Interval Training   Interval  Training No     Bike   Level 0.5   Minutes 17     NuStep   Level 4   Minutes 17   METs 2.3     Track   Laps 14   METs 17     Goals Met:  Independence with exercise equipment Improved SOB with ADL's Using PLB without cueing & demonstrates good technique Exercise tolerated well No report of cardiac concerns or symptoms Strength training completed today  Goals Unmet:  Not Applicable  Comments: Service time is from 1330 to 1515   Dr. Rush Farmer is Medical Director for Pulmonary Rehab at [facility].

## 2016-08-10 NOTE — Progress Notes (Signed)
Pulmonary Individual Treatment Plan  Patient Details  Name: AMMARIE MATSUURA MRN: [id number] Date of Birth: 08-26-1941 Referring Provider:   April Manson Pulmonary Rehab Walk Test from 06/08/2016 in [place]  Referring Provider  Dr. Elsworth Soho      Initial Encounter Date:  Flowsheet Row Pulmonary Rehab Walk Test from 06/08/2016 in [place]  Date  06/08/16  Referring Provider  Dr. Elsworth Soho      Visit Diagnosis: Chronic obstructive pulmonary disease, unspecified COPD type ([place])  Adenocarcinoma of left lung ([place])  Patient's Home Medications on Admission:   Current Outpatient Prescriptions:  .  albuterol (PROVENTIL HFA;VENTOLIN HFA) 108 (90 Base) MCG/ACT inhaler, Inhale 2 puffs into the lungs every 6 (six) hours as needed for wheezing or shortness of breath., Disp: 1 Inhaler, Rfl: 11 .  aspirin 81 MG tablet, Take 81 mg by mouth every evening. , Disp: , Rfl:  .  cyclobenzaprine (FLEXERIL) 5 MG tablet, Take 1 tablet (5 mg total) by mouth 3 (three) times daily as needed for muscle spasms., Disp: 10 tablet, Rfl: 0 .  donepezil (ARICEPT) 10 MG tablet, Take 10 mg by mouth every evening., Disp: , Rfl: 2 .  ezetimibe (ZETIA) 10 MG tablet, Take 10 mg by mouth at bedtime. , Disp: , Rfl:  .  Melatonin 2.5 MG CAPS, Take 1 capsule by mouth at bedtime as needed., Disp: , Rfl:  .  mirtazapine (REMERON) 15 MG tablet, Take 7.5 mg by mouth at bedtime. , Disp: , Rfl: 0 .  niacin (NIASPAN) 500 MG CR tablet, Take 500 mg by mouth every evening. , Disp: , Rfl:  .  pantoprazole (PROTONIX) 40 MG tablet, Take 40 mg by mouth 2 (two) times daily., Disp: , Rfl:  .  potassium chloride SA (K-DUR,KLOR-CON) 20 MEQ tablet, Take 1 tablet (20 mEq total) by mouth 2 (two) times daily., Disp: 14 tablet, Rfl: 0 .  RESTASIS 0.05 % ophthalmic emulsion, Place 1 drop into both eyes., Disp: , Rfl: 4 .  Tiotropium Bromide-Olodaterol (STIOLTO RESPIMAT) 2.5-2.5 MCG/ACT AERS,  Inhale 2 puffs into the lungs daily., Disp: 1 Inhaler, Rfl: 5 .  valACYclovir (VALTREX) 500 MG tablet, Take 500 mg by mouth daily., Disp: , Rfl:  .  White Petrolatum-Mineral Oil (SYSTANE NIGHTTIME OP), Place 1 drop into both eyes at bedtime as needed. For dry eyes, Disp: , Rfl:   Past Medical History: Past Medical History:  Diagnosis Date  . Arthritis   . Asthma   . COPD (chronic obstructive pulmonary disease) ([place])   . Depression   . GERD (gastroesophageal reflux disease)   . Hyperlipidemia   . Hypertension 06/15/2016  . Shortness of breath   . Thyroid nodule     Tobacco Use: History  Smoking Status  . Former Smoker  . Packs/day: 1.00  . Years: 54.00  . Types: Cigarettes  Smokeless Tobacco  . Former Systems developer  . Quit date: 07/04/2015    Comment: Currently smoking 5-6 cigarettes per day.    Labs: Recent Review Flowsheet Data    Labs for ITP Cardiac and Pulmonary Rehab Latest Ref Rng & Units 03/02/2008 07/02/2015 07/05/2015   PHART 7.350 - 7.450 - 7.433 7.380   PCO2ART 35.0 - 45.0 mmHg - 38.3 44.9   HCO3 20.0 - 24.0 mEq/L - 25.1(H) 26.7(H)   TCO2 0 - 100 mmol/L 29 26.3 28   O2SAT % - 94.7 96.0      Capillary Blood Glucose: Lab Results  Component Value Date   GLUCAP 90 07/12/2015   GLUCAP 158 (H) 07/06/2015   GLUCAP 106 (H) 07/06/2015   GLUCAP 115 (H) 06/05/2015     ADL UCSD:   Pulmonary Function Assessment:     Pulmonary Function Assessment - 06/04/16 1437      Breath   Bilateral Breath Sounds Decreased;Rhonchi  deminished on right, scattered rhonci in left   Shortness of Breath Limiting activity      Exercise Target Goals:    Exercise Program Goal: Individual exercise prescription set with THRR, safety & activity barriers. Participant demonstrates ability to understand and report RPE using BORG scale, to self-measure pulse accurately, and to acknowledge the importance of the exercise prescription.  Exercise Prescription Goal: Starting with aerobic  activity 30 plus minutes a day, 3 days per week for initial exercise prescription. Provide home exercise prescription and guidelines that participant acknowledges understanding prior to discharge.  Activity Barriers & Risk Stratification:     Activity Barriers & Cardiac Risk Stratification - 06/04/16 1436      Activity Barriers & Cardiac Risk Stratification   Activity Barriers Back Problems;Shortness of Breath      6 Minute Walk:     6 Minute Walk    Row Name 06/08/16 1653         6 Minute Walk   Phase Initial     Distance 900 feet     Walk Time 4.5 minutes     # of Rest Breaks 1  1 minute and 10 seconds     MPH 1.7     METS 2.3     RPE 13     Perceived Dyspnea  3     Symptoms Yes (comment)     Comments lower back pain 3-4/10     Resting HR 92 bpm     Resting BP 142/74     Max Ex. HR 119 bpm     Max Ex. BP 190/86     2 Minute Post BP 144/80       Interval HR   Baseline HR 92     1 Minute HR 105     2 Minute HR 116     3 Minute HR 107     4 Minute HR 110     5 Minute HR 116     6 Minute HR 119     2 Minute Post HR 93     Interval Heart Rate? Yes       Interval Oxygen   Interval Oxygen? Yes     Baseline Oxygen Saturation % 91 %     Baseline Liters of Oxygen 0 L     1 Minute Oxygen Saturation % 93 %     1 Minute Liters of Oxygen 0 L     2 Minute Oxygen Saturation % 90 %     2 Minute Liters of Oxygen 0 L     3 Minute Oxygen Saturation % 92 %     3 Minute Liters of Oxygen 0 L     4 Minute Oxygen Saturation % 93 %     4 Minute Liters of Oxygen 0 L     5 Minute Oxygen Saturation % 91 %     5 Minute Liters of Oxygen 0 L     6 Minute Oxygen Saturation % 91 %     6 Minute Liters of Oxygen 0 L     2 Minute Post Oxygen Saturation % 97 %  2 Minute Post Liters of Oxygen 0 L        Initial Exercise Prescription:     Initial Exercise Prescription - 06/08/16 1600      Date of Initial Exercise RX and Referring Provider   Date 06/08/16   Referring Provider  Dr. Elsworth Soho     Bike   Level 0.5   Minutes 17     NuStep   Level 2   Minutes 17   METs 1.5     Track   Laps 5   METs 17     Prescription Details   Frequency (times per week) 2   Duration Progress to 45 minutes of aerobic exercise without signs/symptoms of physical distress     Intensity   THRR 40-80% of Max Heartrate 58-116   Ratings of Perceived Exertion 11-13   Perceived Dyspnea 0-4     Progression   Progression Continue progressive overload as per policy without signs/symptoms or physical distress.     Resistance Training   Training Prescription Yes   Weight GREEN BANDS   Reps 10-12      Perform Capillary Blood Glucose checks as needed.  Exercise Prescription Changes:     Exercise Prescription Changes    Row Name 06/17/16 1500 06/22/16 1500 06/24/16 1600 07/01/16 1600 07/06/16 1600     Exercise Review   Progression  -  -  - Yes Yes     Response to Exercise   Blood Pressure (Admit) 118/64 166/70 100/60 110/54 106/60   Blood Pressure (Exercise) 130/70 164/80 116/60 112/60 130/60   Blood Pressure (Exit) 124/80 132/72 110/66 110/60 110/60   Heart Rate (Admit) 65 bpm 98 bpm 83 bpm 102 bpm 85 bpm   Heart Rate (Exercise) 87 bpm 135 bpm 120 bpm 118 bpm 117 bpm   Heart Rate (Exit) 87 bpm 110 bpm 100 bpm 80 bpm 93 bpm   Oxygen Saturation (Admit) 94 % 94 % 94 % 92 % 91 %   Oxygen Saturation (Exercise) 97 % 91 % 93 % 95 % 94 %   Oxygen Saturation (Exit) 94 % 95 % 97 % 97 % 92 %   Rating of Perceived Exertion (Exercise) '13 13 13 11 13   '$ Perceived Dyspnea (Exercise) '2 2 2 1 1   '$ Duration Progress to 45 minutes of aerobic exercise without signs/symptoms of physical distress Progress to 45 minutes of aerobic exercise without signs/symptoms of physical distress Progress to 45 minutes of aerobic exercise without signs/symptoms of physical distress Progress to 45 minutes of aerobic exercise without signs/symptoms of physical distress Progress to 45 minutes of aerobic exercise  without signs/symptoms of physical distress   Intensity Other (comment)  40-80% of THRR Other (comment) THRR unchanged THRR unchanged THRR unchanged     Progression   Progression Continue to progress workloads to maintain intensity without signs/symptoms of physical distress. Continue to progress workloads to maintain intensity without signs/symptoms of physical distress. Continue to progress workloads to maintain intensity without signs/symptoms of physical distress. Continue to progress workloads to maintain intensity without signs/symptoms of physical distress. Continue to progress workloads to maintain intensity without signs/symptoms of physical distress.     Resistance Training   Training Prescription Yes Yes Yes Yes Yes   Weight green bands green bands green bands green bands green bands   Reps 10-12  10 minutes of strength training 10-12  10 minutes of strength training 10-12  10 minutes of strength training 10-12  10 minutes of strength training 10-12  10 minutes of strength training     Interval Training   Interval Training  - No No No No     Bike   Level  - 0.5 0.5  - 0.5   Minutes  - 17 17  - 17     NuStep   Level 2 2  - 3 4   Minutes 17 17  - 17 17   METs 1.8 2  - 1.8 2.5     Track   Laps '11 7 9 11 12   '$ METs '17 17 17 17 17   '$ Row Name 07/08/16 1628 08/03/16 1553 08/05/16 1602         Response to Exercise   Blood Pressure (Admit) 108/64 116/60 112/60     Blood Pressure (Exercise) 126/70 126/70 120/70     Blood Pressure (Exit) 120/64 108/78 112/70     Heart Rate (Admit) 72 bpm 85 bpm 85 bpm     Heart Rate (Exercise) 103 bpm 112 bpm 93 bpm     Heart Rate (Exit) 94 bpm 80 bpm 77 bpm     Oxygen Saturation (Admit) 97 % 97 % 92 %     Oxygen Saturation (Exercise) 92 % 92 % 98 %     Oxygen Saturation (Exit) 95 % 95 % 99 %     Rating of Perceived Exertion (Exercise) '15 13 13     '$ Perceived Dyspnea (Exercise) '3 3 3     '$ Duration Progress to 45 minutes of aerobic  exercise without signs/symptoms of physical distress Progress to 45 minutes of aerobic exercise without signs/symptoms of physical distress Progress to 45 minutes of aerobic exercise without signs/symptoms of physical distress     Intensity THRR unchanged THRR unchanged THRR unchanged       Progression   Progression Continue to progress workloads to maintain intensity without signs/symptoms of physical distress. Continue to progress workloads to maintain intensity without signs/symptoms of physical distress. Continue to progress workloads to maintain intensity without signs/symptoms of physical distress.       Resistance Training   Training Prescription Yes Yes Yes     Weight green bands green bands green bands     Reps 10-12  10 minutes of strength training 10-12  10 minutes of strength training 10-12  10 minutes of strength training       Interval Training   Interval Training No No No       Bike   Level 0.5 0.5  -     Minutes 17 17  -       NuStep   Level '4 4 4     '$ Minutes '17 17 17     '$ METs 2.5 2.5 2.5       Track   Laps  - 15 16     METs  - 17 17        Exercise Comments:     Exercise Comments    Row Name 07/12/16 1029 08/09/16 1654         Exercise Comments Patient is progressing in program. Patient works hard when she is here. Will cont. to monitor and progress.  Patient is progressing well in program. She works hard when she is here. Up to 15 laps in 15 minutes. Will cont. to monitor and progress.          Discharge Exercise Prescription (Final Exercise Prescription Changes):     Exercise Prescription Changes - 08/05/16 1602      Response  to Exercise   Blood Pressure (Admit) 112/60   Blood Pressure (Exercise) 120/70   Blood Pressure (Exit) 112/70   Heart Rate (Admit) 85 bpm   Heart Rate (Exercise) 93 bpm   Heart Rate (Exit) 77 bpm   Oxygen Saturation (Admit) 92 %   Oxygen Saturation (Exercise) 98 %   Oxygen Saturation (Exit) 99 %   Rating of Perceived  Exertion (Exercise) 13   Perceived Dyspnea (Exercise) 3   Duration Progress to 45 minutes of aerobic exercise without signs/symptoms of physical distress   Intensity THRR unchanged     Progression   Progression Continue to progress workloads to maintain intensity without signs/symptoms of physical distress.     Resistance Training   Training Prescription Yes   Weight green bands   Reps 10-12  10 minutes of strength training     Interval Training   Interval Training No     NuStep   Level 4   Minutes 17   METs 2.5     Track   Laps 16   METs 17       Nutrition:  Target Goals: Understanding of nutrition guidelines, daily intake of sodium '1500mg'$ , cholesterol '200mg'$ , calories 30% from fat and 7% or less from saturated fats, daily to have 5 or more servings of fruits and vegetables.  Biometrics:    Nutrition Therapy Plan and Nutrition Goals:   Nutrition Discharge: Rate Your Plate Scores:   Psychosocial: Target Goals: Acknowledge presence or absence of depression, maximize coping skills, provide positive support system. Participant is able to verbalize types and ability to use techniques and skills needed for reducing stress and depression.  Initial Review & Psychosocial Screening:     Initial Psych Review & Screening - 06/04/16 1443      Initial Review   Current issues with History of Depression     Family Dynamics   Good Support System? Yes     Barriers   Psychosocial barriers to participate in program There are no identifiable barriers or psychosocial needs.     Screening Interventions   Interventions Encouraged to exercise      Quality of Life Scores:   PHQ-9: Recent Review Flowsheet Data    Depression screen [facility] 2/9 06/04/2016 08/04/2015   Decreased Interest 0 0   Down, Depressed, Hopeless 0 0   PHQ - 2 Score 0 0      Psychosocial Evaluation and Intervention:     Psychosocial Evaluation - 06/04/16 1448      Psychosocial Evaluation &  Interventions   Interventions Encouraged to exercise with the program and follow exercise prescription      Psychosocial Re-Evaluation:     Psychosocial Re-Evaluation    [place] Name 07/06/16 1150 08/02/16 1407           Psychosocial Re-Evaluation   Interventions Encouraged to attend Pulmonary Rehabilitation for the exercise Encouraged to attend Pulmonary Rehabilitation for the exercise      Comments No psychosocial issues identified at this time Her brother died and she had chest pain during his funeral.  She was evaluated in the ED, and the pain was felt to be muscular and reproducable.        Continued Psychosocial Services Needed No No        Education: Education Goals: Education classes will be provided on a weekly basis, covering required topics. Participant will state understanding/return demonstration of topics presented.  Learning Barriers/Preferences:     Learning Barriers/Preferences - 06/04/16 1436  Learning Barriers/Preferences   Learning Barriers None   Learning Preferences Computer/Internet;Group Instruction;Individual Instruction;Skilled Demonstration;Verbal Instruction;Written Material      Education Topics: Risk Factor Reduction:  -Group instruction that is supported by a PowerPoint presentation. Instructor discusses the definition of a risk factor, different risk factors for pulmonary disease, and how the heart and lungs work together.     Nutrition for Pulmonary Patient:  -Group instruction provided by PowerPoint slides, verbal discussion, and written materials to support subject matter. The instructor gives an explanation and review of healthy diet recommendations, which includes a discussion on weight management, recommendations for fruit and vegetable consumption, as well as protein, fluid, caffeine, fiber, sodium, sugar, and alcohol. Tips for eating when patients are short of breath are discussed.   Pursed Lip Breathing:  -Group instruction that is  supported by demonstration and informational handouts. Instructor discusses the benefits of pursed lip and diaphragmatic breathing and detailed demonstration on how to preform both.   Flowsheet Row PULMONARY REHAB OTHER RESPIRATORY from 08/05/2016 in [place]  Date  06/17/16  Educator  EP  Instruction Review Code  2- meets goals/outcomes      Oxygen Safety:  -Group instruction provided by PowerPoint, verbal discussion, and written material to support subject matter. There is an overview of "What is Oxygen" and "Why do we need it".  Instructor also reviews how to create a safe environment for oxygen use, the importance of using oxygen as prescribed, and the risks of noncompliance. There is a brief discussion on traveling with oxygen and resources the patient may utilize. Flowsheet Row PULMONARY REHAB OTHER RESPIRATORY from 08/05/2016 in [place]  Date  07/08/16  Educator  rn  Instruction Review Code  2- meets goals/outcomes      Oxygen Equipment:  -Group instruction provided by [employer] Staff utilizing handouts, written materials, and equipment demonstrations.   Signs and Symptoms:  -Group instruction provided by written material and verbal discussion to support subject matter. Warning signs and symptoms of infection, stroke, and heart attack are reviewed and when to call the physician/911 reinforced. Tips for preventing the spread of infection discussed.   Advanced Directives:  -Group instruction provided by verbal instruction and written material to support subject matter. Instructor reviews Advanced Directive laws and proper instruction for filling out document.   Pulmonary Video:  -Group video education that reviews the importance of medication and oxygen compliance, exercise, good nutrition, pulmonary hygiene, and pursed lip and diaphragmatic breathing for the pulmonary patient. Flowsheet Row PULMONARY REHAB  OTHER RESPIRATORY from 08/05/2016 in [place]  Date  06/24/16  Educator  video  Instruction Review Code  2- meets goals/outcomes      Exercise for the Pulmonary Patient:  -Group instruction that is supported by a PowerPoint presentation. Instructor discusses benefits of exercise, core components of exercise, frequency, duration, and intensity of an exercise routine, importance of utilizing pulse oximetry during exercise, safety while exercising, and options of places to exercise outside of rehab.   Flowsheet Row PULMONARY REHAB OTHER RESPIRATORY from 08/05/2016 in [place]  Date  08/05/16  Educator  EP  Instruction Review Code  2- meets goals/outcomes      Pulmonary Medications:  -Verbally interactive group education provided by instructor with focus on inhaled medications and proper administration.   Anatomy and Physiology of the Respiratory System and Intimacy:  -Group instruction provided by PowerPoint, verbal discussion, and written material  to support subject matter. Instructor reviews respiratory cycle and anatomical components of the respiratory system and their functions. Instructor also reviews differences in obstructive and restrictive respiratory diseases with examples of each. Intimacy, Sex, and Sexuality differences are reviewed with a discussion on how relationships can change when diagnosed with pulmonary disease. Common sexual concerns are reviewed.   Knowledge Questionnaire Score:   Core Components/Risk Factors/Patient Goals at Admission:     Personal Goals and Risk Factors at Admission - 06/04/16 1438      Core Components/Risk Factors/Patient Goals on Admission   Improve shortness of breath with ADL's Yes   Intervention Provide education, individualized exercise plan and daily activity instruction to help decrease symptoms of SOB with activities of daily living.   Expected Outcomes Short Term:  Achieves a reduction of symptoms when performing activities of daily living.   Develop more efficient breathing techniques such as purse lipped breathing and diaphragmatic breathing; and practicing self-pacing with activity Yes   Intervention Provide education, demonstration and support about specific breathing techniuqes utilized for more efficient breathing. Include techniques such as pursed lipped breathing, diaphragmatic breathing and self-pacing activity.   Expected Outcomes Short Term: Participant will be able to demonstrate and use breathing techniques as needed throughout daily activities.   Increase knowledge of respiratory medications and ability to use respiratory devices properly  Yes   Intervention Provide education and demonstration as needed of appropriate use of medications, inhalers, and oxygen therapy.   Expected Outcomes Short Term: Achieves understanding of medications use. Understands that oxygen is a medication prescribed by physician. Demonstrates appropriate use of inhaler and oxygen therapy.      Core Components/Risk Factors/Patient Goals Review:      Goals and Risk Factor Review    Row Name 07/06/16 1148 08/02/16 1404           Core Components/Risk Factors/Patient Goals Review   Personal Goals Review Develop more efficient breathing techniques such as purse lipped breathing and diaphragmatic breathing and practicing self-pacing with activity.;Improve shortness of breath with ADL's;Increase knowledge of respiratory medications and ability to use respiratory devices properly. Develop more efficient breathing techniques such as purse lipped breathing and diaphragmatic breathing and practicing self-pacing with activity.;Improve shortness of breath with ADL's;Increase knowledge of respiratory medications and ability to use respiratory devices properly.      Review just beginning program, has attended 4 exercise sessions, too early to meet program goals. Has only attended 6  exercise sessions, she has been ill, had a death in the family, and the Thanksgiving holiday have prevented her from attending.  Will start progressing this week.      Expected Outcomes expect to see an improvement in goals in the next 30 days. expect better attendance the next 30 days and will hopefully start progressing regularly.         Core Components/Risk Factors/Patient Goals at Discharge (Final Review):      Goals and Risk Factor Review - 08/02/16 1404      Core Components/Risk Factors/Patient Goals Review   Personal Goals Review Develop more efficient breathing techniques such as purse lipped breathing and diaphragmatic breathing and practicing self-pacing with activity.;Improve shortness of breath with ADL's;Increase knowledge of respiratory medications and ability to use respiratory devices properly.   Review Has only attended 6 exercise sessions, she has been ill, had a death in the family, and the Thanksgiving holiday have prevented her from attending.  Will start progressing this week.   Expected Outcomes expect better attendance the next  30 days and will hopefully start progressing regularly.      ITP Comments:   Comments: ITP REVIEW Pt is making expected progress toward pulmonary rehab goals after completing 8 sessions. Recommend continued exercise, life style modification, education, and utilization of breathing techniques to increase stamina and strength and decrease shortness of breath with exertion.

## 2016-08-12 ENCOUNTER — Encounter (HOSPITAL_COMMUNITY)
Admission: RE | Admit: 2016-08-12 | Discharge: 2016-08-12 | Disposition: A | Discharge status: Home / Self Care | Payer: Commercial Managed Care - HMO | Source: Ambulatory Visit | Attending: Pulmonary Disease | Admitting: Pulmonary Disease

## 2016-08-12 VITALS — Wt 148.1 lb

## 2016-08-12 DIAGNOSIS — J449 Chronic obstructive pulmonary disease, unspecified: Secondary | ICD-10-CM

## 2016-08-12 DIAGNOSIS — C3492 Malignant neoplasm of unspecified part of left bronchus or lung: Secondary | ICD-10-CM

## 2016-08-12 NOTE — Progress Notes (Signed)
Daily Session Note  Patient Details  Name: [name] MRN: [id number] Date of Birth: Nov 03, 1940 Referring Provider:   April Manson Pul[facility] Rehab Walk Test from 06/08/2016 in [place]  Referring Provider  Dr. Elsworth Soho      Encounter Date: 08/12/2016  Check In:     Session Check In - 08/12/16 1347      Check-In   Location [facility]   Staff Present Rosebud Poles, RN, BSN;Thomas Rhude, MS, ACSM RCEP, Exercise Physiologist;Lisa Ysidro Evert, RN;Portia Rollene Rotunda, RN, BSN   Supervising physician immediately available to respond to emergencies [facility] immediately available   Physician(s) Dr. Allyson Sabal   Medication changes reported     No   Fall or balance concerns reported    No   Warm-up and Cool-down Performed as group-led instruction   Resistance Training Performed Yes   VAD Patient? No     Pain Assessment   Currently in Pain? No/denies   Multiple Pain Sites No      Capillary Blood Glucose: No results found for this or any previous visit (from the past 24 hour(s)).      Exercise Prescription Changes - 08/12/16 1600      Response to Exercise   Blood Pressure (Admit) 112/52   Blood Pressure (Exercise) 150/66   Blood Pressure (Exit) 100/70   Heart Rate (Admit) 84 bpm   Heart Rate (Exercise) 111 bpm   Heart Rate (Exit) 79 bpm   Oxygen Saturation (Admit) 98 %   Oxygen Saturation (Exercise) 93 %   Oxygen Saturation (Exit) 97 %   Rating of Perceived Exertion (Exercise) 13   Perceived Dyspnea (Exercise) 2   Duration Progress to 45 minutes of aerobic exercise without signs/symptoms of physical distress   Intensity THRR unchanged     Progression   Progression Continue to progress workloads to maintain intensity without signs/symptoms of physical distress.     Resistance Training   Training Prescription Yes   Weight green bands   Reps 10-12  10 minutes of strength training     Interval Training   Interval Training  No     Bike   Level 0.5   Minutes 17     NuStep   Level 4   Minutes 17   METs 2.5     Goals Met:  Exercise tolerated well No report of cardiac concerns or symptoms Strength training completed today  Goals Unmet:  Not Applicable  Comments: Service time is from 1:30pm to 3:35pm    Dr. Rush Farmer is Medical Director for Pulmonary Rehab at [facility].

## 2016-08-17 ENCOUNTER — Encounter (HOSPITAL_COMMUNITY): Payer: Commercial Managed Care - HMO

## 2016-08-17 ENCOUNTER — Telehealth (HOSPITAL_COMMUNITY): Payer: Self-pay | Admitting: Internal Medicine

## 2016-08-19 ENCOUNTER — Encounter (HOSPITAL_COMMUNITY): Admission: RE | Admit: 2016-08-19 | Payer: Commercial Managed Care - HMO | Source: Ambulatory Visit

## 2016-08-19 IMAGING — CT CT CHEST W/ CM
3 of 6 series · 17 of 36 positions shown, 19 images · IV contrast (OMNIPAQUE)
Comparison: Multiple exams, including 06/05/2015

CLINICAL DATA: Restaging of left lung adenocarcinoma. Left
lobectomy.

EXAM:
CT CHEST WITH CONTRAST
TECHNIQUE: Multidetector CT imaging of the chest was performed during
intravenous contrast administration.
CONTRAST:  75mL OMNIPAQUE IOHEXOL 300 MG/ML  SOLN

[Series 6: chest with st · axial · 0.66mm/px · z∈[+827,+1052]mm · 7 of 61 slices shown, 9 images]
[im 8/61  mediastinal]
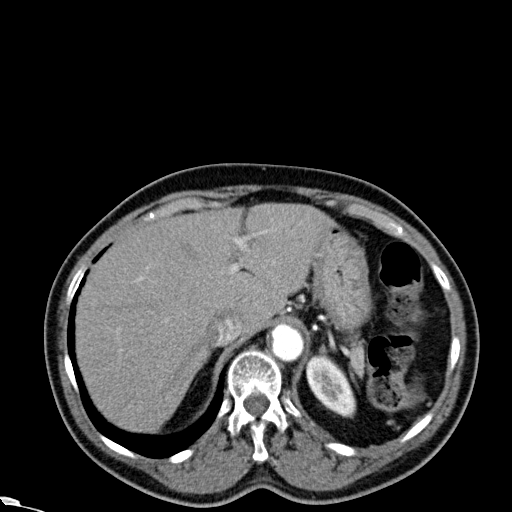
[im 8/61  lung]
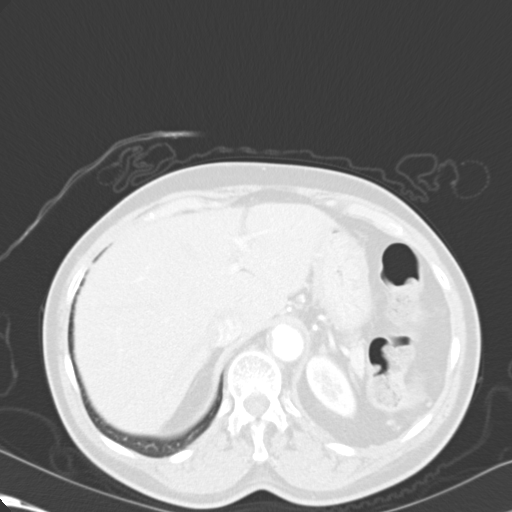
[im 16/61  lung]
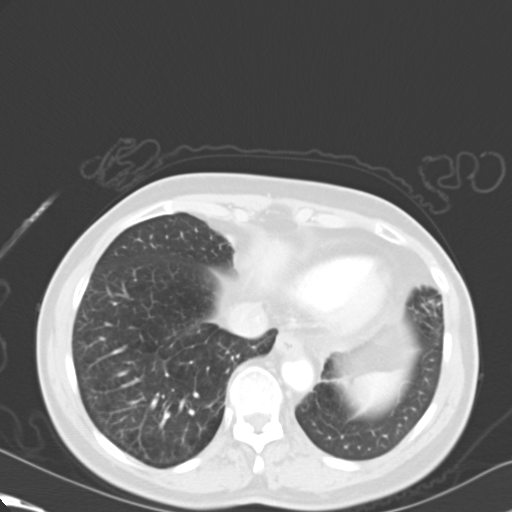
[im 23/61  lung]
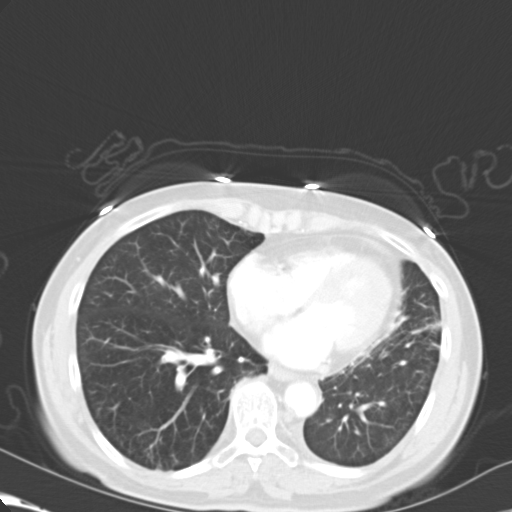
[im 31/61  lung]
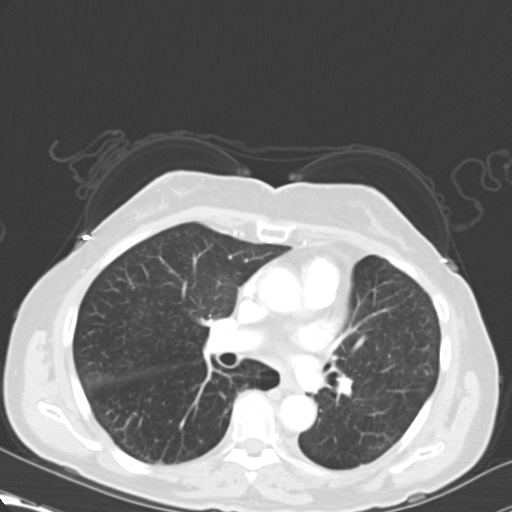
[im 38/61  mediastinal]
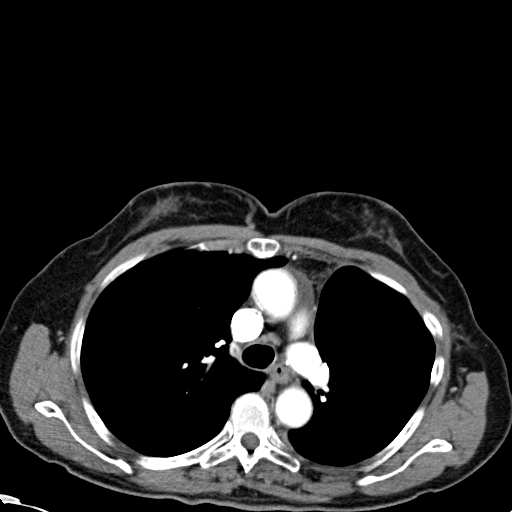
[im 38/61  lung]
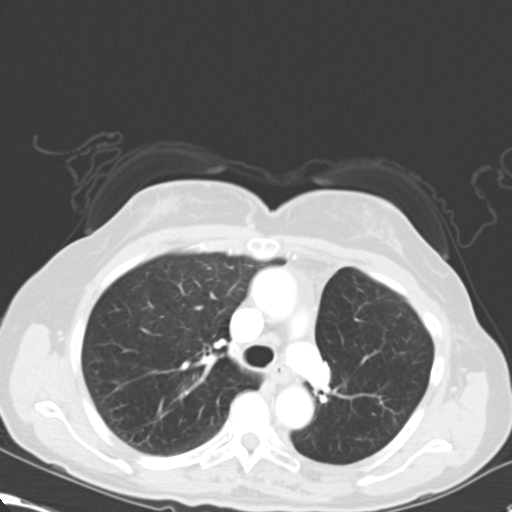
[im 46/61  lung]
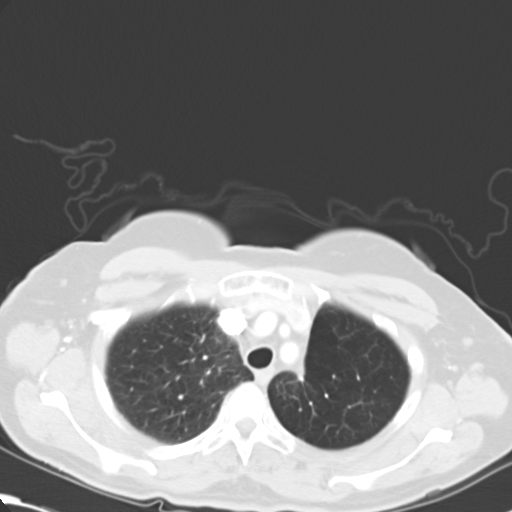
[im 53/61  lung]
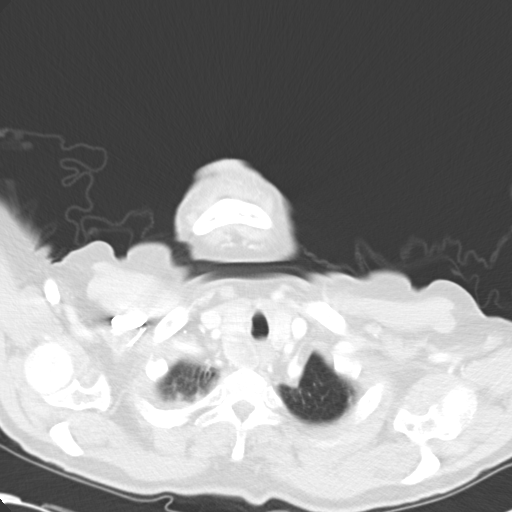

[Series 9: lung windows · axial · 0.66mm/px · z∈[+842,+1052]mm · 7 of 58 slices shown]
[im 8/58  lung]
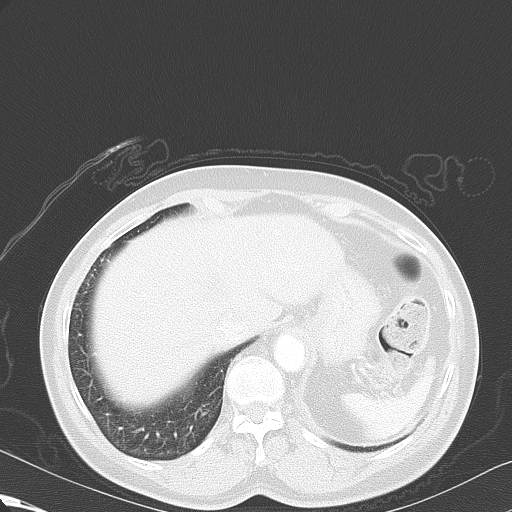
[im 15/58  lung]
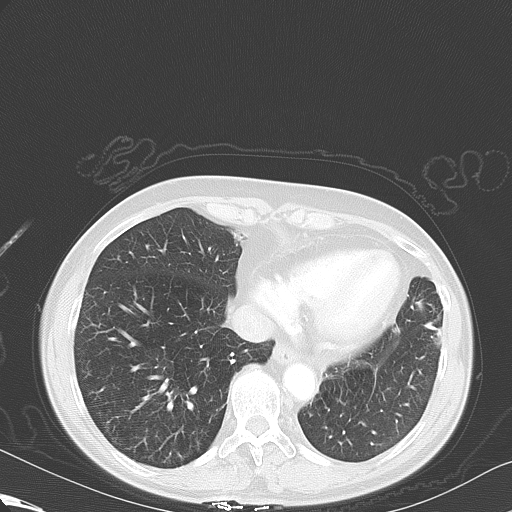
[im 22/58  lung]
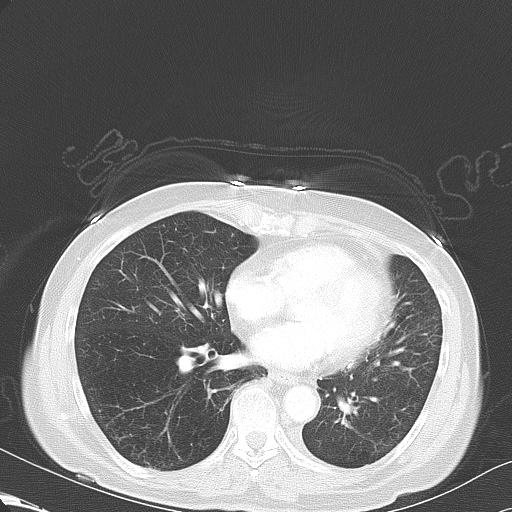
[im 29/58  lung]
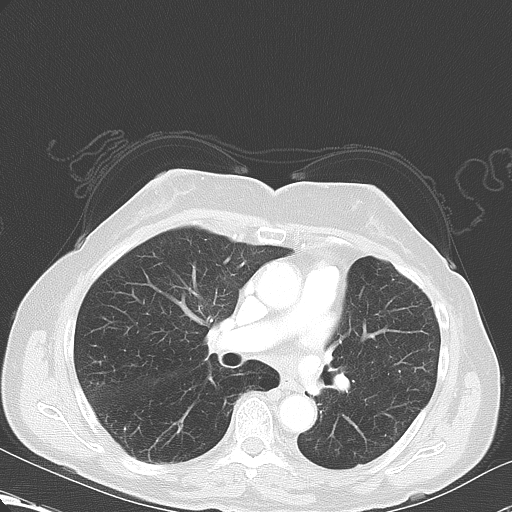
[im 36/58  lung]
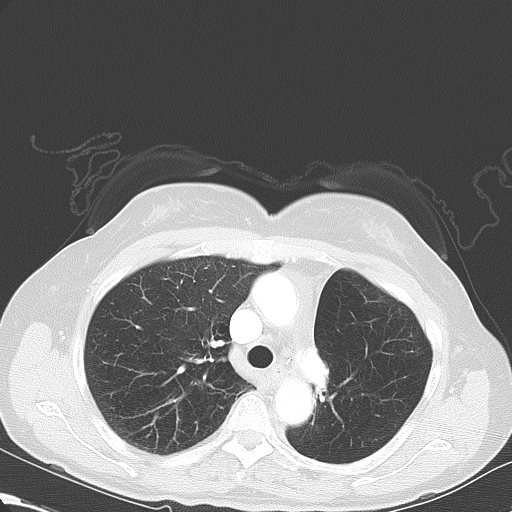
[im 43/58  lung]
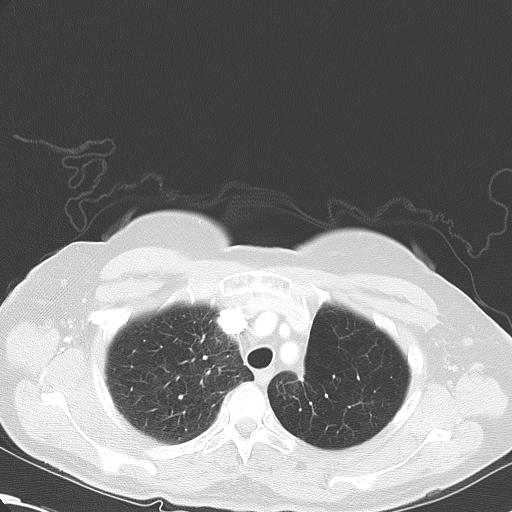
[im 50/58  lung]
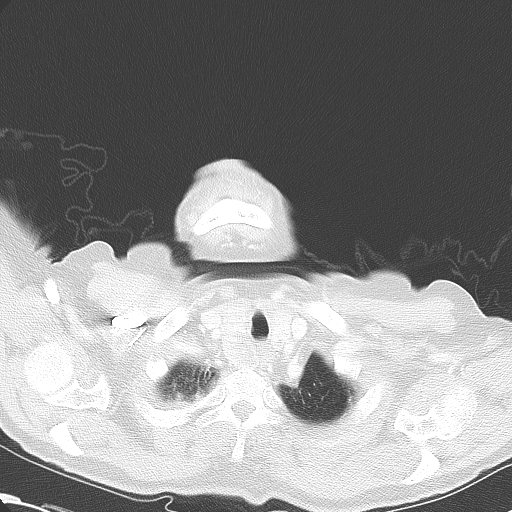

[Series 602: <mpr thick range> · coronal · 0.66mm/px · 3 of 73 slices shown]
[im 15/73  lung]
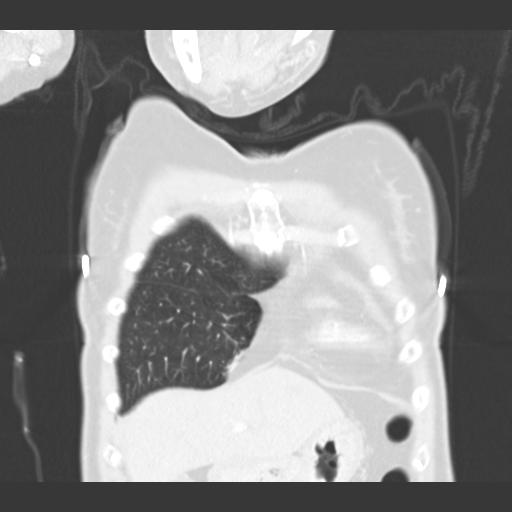
[im 29/73  lung]
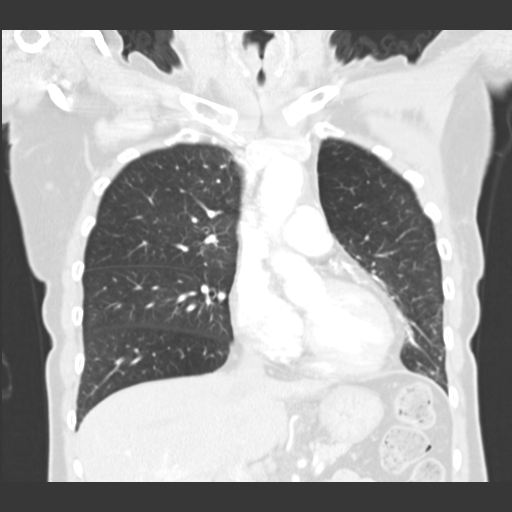
[im 44/73  lung]
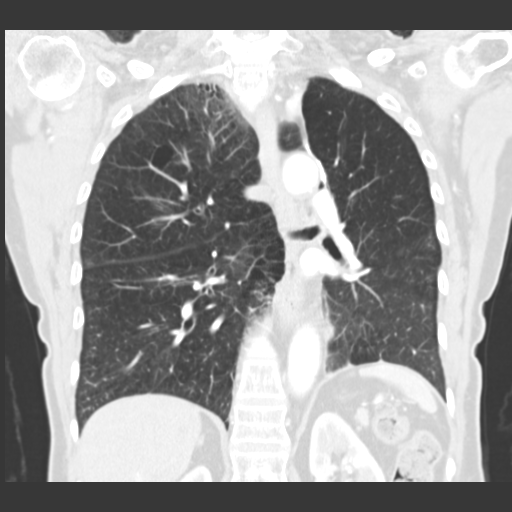

[17 of 36 positions shown; findings below may reference images not displayed]

FINDINGS: Mediastinum/Nodes: Enlarged right thyroid lobe, containing a 2.7 by
1.7 cm primarily hypodense solid nodule posteriorly. This had only
vague metabolic activity on prior PET-CT. Not changed from recent
thyroid ultrasound.

Small mediastinal lymph nodes are not pathologically enlarged.
Minimal aortic arch atherosclerosis.

Lungs/Pleura: Emphysema. Left upper lobectomy. Triangular bandlike
scarring anteriorly at the left lung base with slight nodularity,
likely incidental postoperative, warrants observation.

Upper abdomen: Unremarkable

Musculoskeletal: No significant abnormality.
IMPRESSION: 1. Postoperative findings in the left hemithorax. There is some
scarring at the left lung base which probably merits observation. No
recurrence identified.
2. Right thyroid lobe nodule is again observed, and has been
characterized on prior ultrasound.
3. Emphysema.

## 2016-08-23 ENCOUNTER — Other Ambulatory Visit: Payer: Self-pay | Admitting: Pulmonary Disease

## 2016-08-23 MED ORDER — TIOTROPIUM BROMIDE-OLODATEROL 2.5-2.5 MCG/ACT IN AERS: 2.0000 | INHALATION_SPRAY | Freq: Every day | RESPIRATORY_TRACT | 1 refills | Status: DC

## 2016-08-24 ENCOUNTER — Encounter (HOSPITAL_COMMUNITY): Admission: RE | Admit: 2016-08-24 | Payer: Commercial Managed Care - HMO | Source: Ambulatory Visit

## 2016-08-24 DIAGNOSIS — K5790 Diverticulosis of intestine, part unspecified, without perforation or abscess without bleeding: Secondary | ICD-10-CM | POA: Diagnosis not present

## 2016-08-24 DIAGNOSIS — Z87891 Personal history of nicotine dependence: Secondary | ICD-10-CM | POA: Diagnosis not present

## 2016-08-24 DIAGNOSIS — R197 Diarrhea, unspecified: Secondary | ICD-10-CM | POA: Diagnosis not present

## 2016-08-24 DIAGNOSIS — K5792 Diverticulitis of intestine, part unspecified, without perforation or abscess without bleeding: Secondary | ICD-10-CM | POA: Diagnosis not present

## 2016-08-24 DIAGNOSIS — K922 Gastrointestinal hemorrhage, unspecified: Secondary | ICD-10-CM | POA: Diagnosis not present

## 2016-08-24 DIAGNOSIS — R109 Unspecified abdominal pain: Secondary | ICD-10-CM | POA: Diagnosis not present

## 2016-08-24 DIAGNOSIS — R1084 Generalized abdominal pain: Secondary | ICD-10-CM | POA: Diagnosis not present

## 2016-08-26 ENCOUNTER — Encounter (HOSPITAL_COMMUNITY)
Admission: RE | Admit: 2016-08-26 | Discharge: 2016-08-26 | Disposition: A | Discharge status: Home / Self Care | Payer: Commercial Managed Care - HMO | Source: Ambulatory Visit | Attending: Pulmonary Disease | Admitting: Pulmonary Disease

## 2016-08-31 ENCOUNTER — Encounter (HOSPITAL_COMMUNITY): Payer: Commercial Managed Care - HMO

## 2016-09-02 ENCOUNTER — Encounter (HOSPITAL_COMMUNITY)
Admission: RE | Admit: 2016-09-02 | Discharge: 2016-09-02 | Disposition: A | Discharge status: Home / Self Care | Payer: Commercial Managed Care - HMO | Source: Ambulatory Visit | Attending: Pulmonary Disease | Admitting: Pulmonary Disease

## 2016-09-02 DIAGNOSIS — C3492 Malignant neoplasm of unspecified part of left bronchus or lung: Secondary | ICD-10-CM

## 2016-09-02 DIAGNOSIS — J449 Chronic obstructive pulmonary disease, unspecified: Secondary | ICD-10-CM

## 2016-09-02 NOTE — Progress Notes (Signed)
Pulmonary Individual Treatment Plan  Patient Details  Name: [name] MRN: [id number] Date of Birth: 02/03/1941 Referring Provider:   April Manson Pulmonary Rehab Walk Test from 06/08/2016 in [place]  Referring Provider  Dr. Elsworth Soho      Initial Encounter Date:  Flowsheet Row Pulmonary Rehab Walk Test from 06/08/2016 in [place]  Date  06/08/16  Referring Provider  Dr. Elsworth Soho      Visit Diagnosis: Chronic obstructive pulmonary disease, unspecified COPD type ([place])  Adenocarcinoma of left lung ([place])  Patient's Home Medications on Admission:   Current Outpatient Prescriptions:  .  albuterol (PROVENTIL HFA;VENTOLIN HFA) 108 (90 Base) MCG/ACT inhaler, Inhale 2 puffs into the lungs every 6 (six) hours as needed for wheezing or shortness of breath., Disp: 1 Inhaler, Rfl: 11 .  aspirin 81 MG tablet, Take 81 mg by mouth every evening. , Disp: , Rfl:  .  cyclobenzaprine (FLEXERIL) 5 MG tablet, Take 1 tablet (5 mg total) by mouth 3 (three) times daily as needed for muscle spasms., Disp: 10 tablet, Rfl: 0 .  donepezil (ARICEPT) 10 MG tablet, Take 10 mg by mouth every evening., Disp: , Rfl: 2 .  ezetimibe (ZETIA) 10 MG tablet, Take 10 mg by mouth at bedtime. , Disp: , Rfl:  .  Melatonin 2.5 MG CAPS, Take 1 capsule by mouth at bedtime as needed., Disp: , Rfl:  .  mirtazapine (REMERON) 15 MG tablet, Take 7.5 mg by mouth at bedtime. , Disp: , Rfl: 0 .  niacin (NIASPAN) 500 MG CR tablet, Take 500 mg by mouth every evening. , Disp: , Rfl:  .  pantoprazole (PROTONIX) 40 MG tablet, Take 40 mg by mouth 2 (two) times daily., Disp: , Rfl:  .  potassium chloride SA (K-DUR,KLOR-CON) 20 MEQ tablet, Take 1 tablet (20 mEq total) by mouth 2 (two) times daily., Disp: 14 tablet, Rfl: 0 .  RESTASIS 0.05 % ophthalmic emulsion, Place 1 drop into both eyes., Disp: , Rfl: 4 .  Tiotropium Bromide-Olodaterol (STIOLTO RESPIMAT) 2.5-2.5 MCG/ACT AERS,  Inhale 2 puffs into the lungs daily., Disp: 3 Inhaler, Rfl: 1 .  valACYclovir (VALTREX) 500 MG tablet, Take 500 mg by mouth daily., Disp: , Rfl:  .  White Petrolatum-Mineral Oil (SYSTANE NIGHTTIME OP), Place 1 drop into both eyes at bedtime as needed. For dry eyes, Disp: , Rfl:   Past Medical History: Past Medical History:  Diagnosis Date  . Arthritis   . Asthma   . COPD (chronic obstructive pulmonary disease) ([place])   . Depression   . GERD (gastroesophageal reflux disease)   . Hyperlipidemia   . Hypertension 06/15/2016  . Shortness of breath   . Thyroid nodule     Tobacco Use: History  Smoking Status  . Former Smoker  . Packs/day: 1.00  . Years: 54.00  . Types: Cigarettes  Smokeless Tobacco  . Former Systems developer  . Quit date: 07/04/2015    Comment: Currently smoking 5-6 cigarettes per day.    Labs: Recent Review Flowsheet Data    Labs for ITP Cardiac and Pulmonary Rehab Latest Ref Rng & Units 03/02/2008 07/02/2015 07/05/2015   PHART 7.350 - 7.450 - 7.433 7.380   PCO2ART 35.0 - 45.0 mmHg - 38.3 44.9   HCO3 20.0 - 24.0 mEq/L - 25.1(H) 26.7(H)   TCO2 0 - 100 mmol/L 29 26.3 28   O2SAT % - 94.7 96.0      Capillary Blood Glucose: Lab Results  Component Value Date   GLUCAP 90 07/12/2015   GLUCAP 158 (H) 07/06/2015   GLUCAP 106 (H) 07/06/2015   GLUCAP 115 (H) 06/05/2015     ADL UCSD:   Pulmonary Function Assessment:     Pulmonary Function Assessment - 06/04/16 1437      Breath   Bilateral Breath Sounds Decreased;Rhonchi  deminished on right, scattered rhonci in left   Shortness of Breath Limiting activity      Exercise Target Goals:    Exercise Program Goal: Individual exercise prescription set with THRR, safety & activity barriers. Participant demonstrates ability to understand and report RPE using BORG scale, to self-measure pulse accurately, and to acknowledge the importance of the exercise prescription.  Exercise Prescription Goal: Starting with aerobic  activity 30 plus minutes a day, 3 days per week for initial exercise prescription. Provide home exercise prescription and guidelines that participant acknowledges understanding prior to discharge.  Activity Barriers & Risk Stratification:     Activity Barriers & Cardiac Risk Stratification - 06/04/16 1436      Activity Barriers & Cardiac Risk Stratification   Activity Barriers Back Problems;Shortness of Breath      6 Minute Walk:     6 Minute Walk    Row Name 06/08/16 1653         6 Minute Walk   Phase Initial     Distance 900 feet     Walk Time 4.5 minutes     # of Rest Breaks 1  1 minute and 10 seconds     MPH 1.7     METS 2.3     RPE 13     Perceived Dyspnea  3     Symptoms Yes (comment)     Comments lower back pain 3-4/10     Resting HR 92 bpm     Resting BP 142/74     Max Ex. HR 119 bpm     Max Ex. BP 190/86     2 Minute Post BP 144/80       Interval HR   Baseline HR 92     1 Minute HR 105     2 Minute HR 116     3 Minute HR 107     4 Minute HR 110     5 Minute HR 116     6 Minute HR 119     2 Minute Post HR 93     Interval Heart Rate? Yes       Interval Oxygen   Interval Oxygen? Yes     Baseline Oxygen Saturation % 91 %     Baseline Liters of Oxygen 0 L     1 Minute Oxygen Saturation % 93 %     1 Minute Liters of Oxygen 0 L     2 Minute Oxygen Saturation % 90 %     2 Minute Liters of Oxygen 0 L     3 Minute Oxygen Saturation % 92 %     3 Minute Liters of Oxygen 0 L     4 Minute Oxygen Saturation % 93 %     4 Minute Liters of Oxygen 0 L     5 Minute Oxygen Saturation % 91 %     5 Minute Liters of Oxygen 0 L     6 Minute Oxygen Saturation % 91 %     6 Minute Liters of Oxygen 0 L     2 Minute Post Oxygen Saturation % 97 %  2 Minute Post Liters of Oxygen 0 L        Initial Exercise Prescription:     Initial Exercise Prescription - 06/08/16 1600      Date of Initial Exercise RX and Referring Provider   Date 06/08/16   Referring Provider  Dr. Elsworth Soho     Bike   Level 0.5   Minutes 17     NuStep   Level 2   Minutes 17   METs 1.5     Track   Laps 5   METs 17     Prescription Details   Frequency (times per week) 2   Duration Progress to 45 minutes of aerobic exercise without signs/symptoms of physical distress     Intensity   THRR 40-80% of Max Heartrate 58-116   Ratings of Perceived Exertion 11-13   Perceived Dyspnea 0-4     Progression   Progression Continue progressive overload as per policy without signs/symptoms or physical distress.     Resistance Training   Training Prescription Yes   Weight GREEN BANDS   Reps 10-12      Perform Capillary Blood Glucose checks as needed.  Exercise Prescription Changes:     Exercise Prescription Changes    Row Name 06/17/16 1500 06/22/16 1500 06/24/16 1600 07/01/16 1600 07/06/16 1600     Exercise Review   Progression  -  -  - Yes Yes     Response to Exercise   Blood Pressure (Admit) 118/64 166/70 100/60 110/54 106/60   Blood Pressure (Exercise) 130/70 164/80 116/60 112/60 130/60   Blood Pressure (Exit) 124/80 132/72 110/66 110/60 110/60   Heart Rate (Admit) 65 bpm 98 bpm 83 bpm 102 bpm 85 bpm   Heart Rate (Exercise) 87 bpm 135 bpm 120 bpm 118 bpm 117 bpm   Heart Rate (Exit) 87 bpm 110 bpm 100 bpm 80 bpm 93 bpm   Oxygen Saturation (Admit) 94 % 94 % 94 % 92 % 91 %   Oxygen Saturation (Exercise) 97 % 91 % 93 % 95 % 94 %   Oxygen Saturation (Exit) 94 % 95 % 97 % 97 % 92 %   Rating of Perceived Exertion (Exercise) '13 13 13 11 13   '$ Perceived Dyspnea (Exercise) '2 2 2 1 1   '$ Duration Progress to 45 minutes of aerobic exercise without signs/symptoms of physical distress Progress to 45 minutes of aerobic exercise without signs/symptoms of physical distress Progress to 45 minutes of aerobic exercise without signs/symptoms of physical distress Progress to 45 minutes of aerobic exercise without signs/symptoms of physical distress Progress to 45 minutes of aerobic exercise  without signs/symptoms of physical distress   Intensity Other (comment)  40-80% of THRR Other (comment) THRR unchanged THRR unchanged THRR unchanged     Progression   Progression Continue to progress workloads to maintain intensity without signs/symptoms of physical distress. Continue to progress workloads to maintain intensity without signs/symptoms of physical distress. Continue to progress workloads to maintain intensity without signs/symptoms of physical distress. Continue to progress workloads to maintain intensity without signs/symptoms of physical distress. Continue to progress workloads to maintain intensity without signs/symptoms of physical distress.     Resistance Training   Training Prescription Yes Yes Yes Yes Yes   Weight green bands green bands green bands green bands green bands   Reps 10-12  10 minutes of strength training 10-12  10 minutes of strength training 10-12  10 minutes of strength training 10-12  10 minutes of strength training 10-12  10 minutes of strength training     Interval Training   Interval Training  - No No No No     Bike   Level  - 0.5 0.5  - 0.5   Minutes  - 17 17  - 17     NuStep   Level 2 2  - 3 4   Minutes 17 17  - 17 17   METs 1.8 2  - 1.8 2.5     Track   Laps '11 7 9 11 12   '$ METs '17 17 17 17 17   '$ Row Name 07/08/16 1628 08/03/16 1553 08/05/16 1602 08/10/16 1546 08/12/16 1600     Response to Exercise   Blood Pressure (Admit) 108/64 116/60 112/60 98/52 112/52   Blood Pressure (Exercise) 126/70 126/70 120/70 112/60 150/66   Blood Pressure (Exit) 120/64 108/78 112/70 112/64 100/70   Heart Rate (Admit) 72 bpm 85 bpm 85 bpm 84 bpm 84 bpm   Heart Rate (Exercise) 103 bpm 112 bpm 93 bpm 108 bpm 111 bpm   Heart Rate (Exit) 94 bpm 80 bpm 77 bpm 85 bpm 79 bpm   Oxygen Saturation (Admit) 97 % 97 % 92 % 96 % 98 %   Oxygen Saturation (Exercise) 92 % 92 % 98 % 94 % 93 %   Oxygen Saturation (Exit) 95 % 95 % 99 % 93 % 97 %   Rating of Perceived  Exertion (Exercise) '15 13 13 13 13   '$ Perceived Dyspnea (Exercise) '3 3 3 2 2   '$ Duration Progress to 45 minutes of aerobic exercise without signs/symptoms of physical distress Progress to 45 minutes of aerobic exercise without signs/symptoms of physical distress Progress to 45 minutes of aerobic exercise without signs/symptoms of physical distress Progress to 45 minutes of aerobic exercise without signs/symptoms of physical distress Progress to 45 minutes of aerobic exercise without signs/symptoms of physical distress   Intensity THRR unchanged THRR unchanged THRR unchanged THRR unchanged THRR unchanged     Progression   Progression Continue to progress workloads to maintain intensity without signs/symptoms of physical distress. Continue to progress workloads to maintain intensity without signs/symptoms of physical distress. Continue to progress workloads to maintain intensity without signs/symptoms of physical distress. Continue to progress workloads to maintain intensity without signs/symptoms of physical distress. Continue to progress workloads to maintain intensity without signs/symptoms of physical distress.     Resistance Training   Training Prescription Yes Yes Yes Yes Yes   Weight green bands green bands green bands green bands green bands   Reps 10-12  10 minutes of strength training 10-12  10 minutes of strength training 10-12  10 minutes of strength training 10-12  10 minutes of strength training 10-12  10 minutes of strength training     Interval Training   Interval Training No No No No No     Bike   Level 0.5 0.5  - 0.5 0.5   Minutes 17 17  - 17 17     NuStep   Level '4 4 4 4 4   '$ Minutes '17 17 17 17 17   '$ METs 2.5 2.5 2.5 2.3 2.5     Track   Laps  - '15 16 14  '$ -   METs  - '17 17 17  '$ -      Exercise Comments:     Exercise Comments    Row Name 07/12/16 1029 08/09/16 1654 08/26/16 0849       Exercise Comments Patient is progressing  in program. Patient works hard when  she is here. Will cont. to monitor and progress.  Patient is progressing well in program. She works hard when she is here. Up to 15 laps in 15 minutes. Will cont. to monitor and progress.  Patient has not been here since 12/7 due to illness and being out of town. Will cont. to monitor and progress when patient returns to rehab.         Discharge Exercise Prescription (Final Exercise Prescription Changes):     Exercise Prescription Changes - 08/12/16 1600      Response to Exercise   Blood Pressure (Admit) 112/52   Blood Pressure (Exercise) 150/66   Blood Pressure (Exit) 100/70   Heart Rate (Admit) 84 bpm   Heart Rate (Exercise) 111 bpm   Heart Rate (Exit) 79 bpm   Oxygen Saturation (Admit) 98 %   Oxygen Saturation (Exercise) 93 %   Oxygen Saturation (Exit) 97 %   Rating of Perceived Exertion (Exercise) 13   Perceived Dyspnea (Exercise) 2   Duration Progress to 45 minutes of aerobic exercise without signs/symptoms of physical distress   Intensity THRR unchanged     Progression   Progression Continue to progress workloads to maintain intensity without signs/symptoms of physical distress.     Resistance Training   Training Prescription Yes   Weight green bands   Reps 10-12  10 minutes of strength training     Interval Training   Interval Training No     Bike   Level 0.5   Minutes 17     NuStep   Level 4   Minutes 17   METs 2.5       Nutrition:  Target Goals: Understanding of nutrition guidelines, daily intake of sodium '1500mg'$ , cholesterol '200mg'$ , calories 30% from fat and 7% or less from saturated fats, daily to have 5 or more servings of fruits and vegetables.  Biometrics:    Nutrition Therapy Plan and Nutrition Goals:   Nutrition Discharge: Rate Your Plate Scores:   Psychosocial: Target Goals: Acknowledge presence or absence of depression, maximize coping skills, provide positive support system. Participant is able to verbalize types and ability to use  techniques and skills needed for reducing stress and depression.  Initial Review & Psychosocial Screening:     Initial Psych Review & Screening - 06/04/16 1443      Initial Review   Current issues with History of Depression     Family Dynamics   Good Support System? Yes     Barriers   Psychosocial barriers to participate in program There are no identifiable barriers or psychosocial needs.     Screening Interventions   Interventions Encouraged to exercise      Quality of Life Scores:   PHQ-9: Recent Review Flowsheet Data    Depression screen [facility] 2/9 06/04/2016 08/04/2015   Decreased Interest 0 0   Down, Depressed, Hopeless 0 0   PHQ - 2 Score 0 0      Psychosocial Evaluation and Intervention:     Psychosocial Evaluation - 06/04/16 1448      Psychosocial Evaluation & Interventions   Interventions Encouraged to exercise with the program and follow exercise prescription      Psychosocial Re-Evaluation:     Psychosocial Re-Evaluation    [place] Name 07/06/16 1150 08/02/16 1407 08/26/16 1025         Psychosocial Re-Evaluation   Interventions Encouraged to attend Pulmonary Rehabilitation for the exercise Encouraged to attend Pulmonary Rehabilitation for the  exercise Encouraged to attend Pulmonary Rehabilitation for the exercise     Comments No psychosocial issues identified at this time Her brother died and she had chest pain during his funeral.  She was evaluated in the ED, and the pain was felt to be muscular and reproducable.   No psychosocial issues identified at this time.     Continued Psychosocial Services Needed No No No       Education: Education Goals: Education classes will be provided on a weekly basis, covering required topics. Participant will state understanding/return demonstration of topics presented.  Learning Barriers/Preferences:     Learning Barriers/Preferences - 06/04/16 1436      Learning Barriers/Preferences   Learning Barriers None    Learning Preferences Computer/Internet;Group Instruction;Individual Instruction;Skilled Demonstration;Verbal Instruction;Written Material      Education Topics: Risk Factor Reduction:  -Group instruction that is supported by a PowerPoint presentation. Instructor discusses the definition of a risk factor, different risk factors for pulmonary disease, and how the heart and lungs work together.     Nutrition for Pulmonary Patient:  -Group instruction provided by PowerPoint slides, verbal discussion, and written materials to support subject matter. The instructor gives an explanation and review of healthy diet recommendations, which includes a discussion on weight management, recommendations for fruit and vegetable consumption, as well as protein, fluid, caffeine, fiber, sodium, sugar, and alcohol. Tips for eating when patients are short of breath are discussed.   Pursed Lip Breathing:  -Group instruction that is supported by demonstration and informational handouts. Instructor discusses the benefits of pursed lip and diaphragmatic breathing and detailed demonstration on how to preform both.   Flowsheet Row PULMONARY REHAB OTHER RESPIRATORY from 08/12/2016 in [place]  Date  06/17/16  Educator  EP  Instruction Review Code  2- meets goals/outcomes      Oxygen Safety:  -Group instruction provided by PowerPoint, verbal discussion, and written material to support subject matter. There is an overview of "What is Oxygen" and "Why do we need it".  Instructor also reviews how to create a safe environment for oxygen use, the importance of using oxygen as prescribed, and the risks of noncompliance. There is a brief discussion on traveling with oxygen and resources the patient may utilize. Flowsheet Row PULMONARY REHAB OTHER RESPIRATORY from 08/12/2016 in [place]  Date  07/08/16  Educator  rn  Instruction Review Code  2- meets  goals/outcomes      Oxygen Equipment:  -Group instruction provided by [employer] Staff utilizing handouts, written materials, and equipment demonstrations.   Signs and Symptoms:  -Group instruction provided by written material and verbal discussion to support subject matter. Warning signs and symptoms of infection, stroke, and heart attack are reviewed and when to call the physician/911 reinforced. Tips for preventing the spread of infection discussed. Flowsheet Row PULMONARY REHAB OTHER RESPIRATORY from 08/12/2016 in [place]  Date  08/12/16  Educator  rn  Instruction Review Code  2- meets goals/outcomes      Advanced Directives:  -Group instruction provided by verbal instruction and written material to support subject matter. Instructor reviews Advanced Directive laws and proper instruction for filling out document.   Pulmonary Video:  -Group video education that reviews the importance of medication and oxygen compliance, exercise, good nutrition, pulmonary hygiene, and pursed lip and diaphragmatic breathing for the pulmonary patient. Flowsheet Row PULMONARY REHAB OTHER RESPIRATORY from 08/12/2016 in [place]  Date  06/24/16  Educator  video  Instruction Review Code  2- meets goals/outcomes      Exercise for the Pulmonary Patient:  -Group instruction that is supported by a PowerPoint presentation. Instructor discusses benefits of exercise, core components of exercise, frequency, duration, and intensity of an exercise routine, importance of utilizing pulse oximetry during exercise, safety while exercising, and options of places to exercise outside of rehab.   Flowsheet Row PULMONARY REHAB OTHER RESPIRATORY from 08/12/2016 in [place]  Date  08/05/16  Educator  EP  Instruction Review Code  2- meets goals/outcomes      Pulmonary Medications:  -Verbally interactive group education  provided by instructor with focus on inhaled medications and proper administration.   Anatomy and Physiology of the Respiratory System and Intimacy:  -Group instruction provided by PowerPoint, verbal discussion, and written material to support subject matter. Instructor reviews respiratory cycle and anatomical components of the respiratory system and their functions. Instructor also reviews differences in obstructive and restrictive respiratory diseases with examples of each. Intimacy, Sex, and Sexuality differences are reviewed with a discussion on how relationships can change when diagnosed with pulmonary disease. Common sexual concerns are reviewed.   Knowledge Questionnaire Score:   Core Components/Risk Factors/Patient Goals at Admission:     Personal Goals and Risk Factors at Admission - 06/04/16 1438      Core Components/Risk Factors/Patient Goals on Admission   Improve shortness of breath with ADL's Yes   Intervention Provide education, individualized exercise plan and daily activity instruction to help decrease symptoms of SOB with activities of daily living.   Expected Outcomes Short Term: Achieves a reduction of symptoms when performing activities of daily living.   Develop more efficient breathing techniques such as purse lipped breathing and diaphragmatic breathing; and practicing self-pacing with activity Yes   Intervention Provide education, demonstration and support about specific breathing techniuqes utilized for more efficient breathing. Include techniques such as pursed lipped breathing, diaphragmatic breathing and self-pacing activity.   Expected Outcomes Short Term: Participant will be able to demonstrate and use breathing techniques as needed throughout daily activities.   Increase knowledge of respiratory medications and ability to use respiratory devices properly  Yes   Intervention Provide education and demonstration as needed of appropriate use of medications,  inhalers, and oxygen therapy.   Expected Outcomes Short Term: Achieves understanding of medications use. Understands that oxygen is a medication prescribed by physician. Demonstrates appropriate use of inhaler and oxygen therapy.      Core Components/Risk Factors/Patient Goals Review:      Goals and Risk Factor Review    Row Name 07/06/16 1148 08/02/16 1404 08/26/16 1023         Core Components/Risk Factors/Patient Goals Review   Personal Goals Review Develop more efficient breathing techniques such as purse lipped breathing and diaphragmatic breathing and practicing self-pacing with activity.;Improve shortness of breath with ADL's;Increase knowledge of respiratory medications and ability to use respiratory devices properly. Develop more efficient breathing techniques such as purse lipped breathing and diaphragmatic breathing and practicing self-pacing with activity.;Improve shortness of breath with ADL's;Increase knowledge of respiratory medications and ability to use respiratory devices properly. Develop more efficient breathing techniques such as purse lipped breathing and diaphragmatic breathing and practicing self-pacing with activity.;Improve shortness of breath with ADL's;Increase knowledge of respiratory medications and ability to use respiratory devices properly.     Review just beginning program, has attended 4 exercise sessions, too early to meet program goals. Has only  attended 6 exercise sessions, she has been ill, had a death in the family, and the Thanksgiving holiday have prevented her from attending.  Will start progressing this week. Attendance has been irregular, she has been sick with a stomach virus and now is visiting family during [place].  Has not made great progress.     Expected Outcomes expect to see an improvement in goals in the next 30 days. expect better attendance the next 30 days and will hopefully start progressing regularly. Expect poor outcomes due to poor  attendance        Core Components/Risk Factors/Patient Goals at Discharge (Final Review):      Goals and Risk Factor Review - 08/26/16 1023      Core Components/Risk Factors/Patient Goals Review   Personal Goals Review Develop more efficient breathing techniques such as purse lipped breathing and diaphragmatic breathing and practicing self-pacing with activity.;Improve shortness of breath with ADL's;Increase knowledge of respiratory medications and ability to use respiratory devices properly.   Review Attendance has been irregular, she has been sick with a stomach virus and now is visiting family during [place].  Has not made great progress.   Expected Outcomes Expect poor outcomes due to poor attendance      ITP Comments:   Comments: ITP REVIEW Pt is making expected progress toward pulmonary rehab goals after completing 10 sessions. Recommend continued exercise, life style modification, education, and utilization of breathing techniques to increase stamina and strength and decrease shortness of breath with exertion.

## 2016-09-07 ENCOUNTER — Encounter (HOSPITAL_COMMUNITY): Payer: Commercial Managed Care - HMO

## 2016-09-09 ENCOUNTER — Encounter (HOSPITAL_COMMUNITY): Payer: Commercial Managed Care - HMO

## 2016-09-14 ENCOUNTER — Encounter (HOSPITAL_COMMUNITY): Payer: Commercial Managed Care - HMO

## 2016-09-16 ENCOUNTER — Telehealth (HOSPITAL_COMMUNITY): Payer: Self-pay | Admitting: *Deleted

## 2016-09-16 ENCOUNTER — Encounter (HOSPITAL_COMMUNITY): Admission: RE | Admit: 2016-09-16 | Payer: Commercial Managed Care - HMO | Source: Ambulatory Visit

## 2016-09-21 ENCOUNTER — Encounter (HOSPITAL_COMMUNITY): Payer: Commercial Managed Care - HMO

## 2016-09-22 NOTE — Addendum Note (Signed)
Encounter addended by: Jewel Baize, RD on: 09/22/2016  2:26 PM<BR>    Actions taken: Visit Navigator Flowsheet section accepted

## 2016-09-23 ENCOUNTER — Encounter (HOSPITAL_COMMUNITY): Payer: Commercial Managed Care - HMO

## 2016-09-27 NOTE — Progress Notes (Signed)
Discharge Summary  Patient Details  Name: [name] MRN: [id number] Date of Birth: 04-Dec-1940 Referring Provider:   [facility] Test from 06/08/2016 in [place]  Referring Provider  Dr. Elsworth Soho       Number of Visits: 10  Reason for Discharge:  Early Exit:  Poor attendance, illness, inability to attend  Smoking History:  History  Smoking Status  . Former Smoker  . Packs/day: 1.00  . Years: 54.00  . Types: Cigarettes  Smokeless Tobacco  . Former Systems developer  . Quit date: 07/04/2015    Comment: Currently smoking 5-6 cigarettes per day.    Diagnosis:  Chronic obstructive pulmonary disease, unspecified COPD type ([place])  Adenocarcinoma of left lung ([place] [facility])  ADL [facility]:   Initial Exercise Prescription:     Initial Exercise Prescription - 06/08/16 1600      Date of Initial Exercise RX and Referring Provider   Date 06/08/16   Referring Provider Dr. Elsworth Soho     Bike   Level 0.5   Minutes 17     NuStep   Level 2   Minutes 17   METs 1.5     Track   Laps 5   METs 17     Prescription Details   Frequency (times per week) 2   Duration Progress to 45 minutes of aerobic exercise without signs/symptoms of physical distress     Intensity   THRR 40-80% of Max Heartrate 58-116   Ratings of Perceived Exertion 11-13   Perceived Dyspnea 0-4     Progression   Progression Continue progressive overload as per policy without signs/symptoms or physical distress.     Resistance Training   Training Prescription Yes   Weight GREEN BANDS   Reps 10-12      Discharge Exercise Prescription (Final Exercise Prescription Changes):     Exercise Prescription Changes - 08/12/16 1600      Response to Exercise   Blood Pressure (Admit) 112/52   Blood Pressure (Exercise) 150/66   Blood Pressure (Exit) 100/70   Heart Rate (Admit) 84 bpm   Heart Rate (Exercise) 111 bpm   Heart Rate (Exit) 79 bpm   Oxygen Saturation (Admit) 98 %   Oxygen Saturation (Exercise) 93 %   Oxygen Saturation (Exit) 97 %   Rating of Perceived Exertion (Exercise) 13   Perceived Dyspnea (Exercise) 2   Duration Progress to 45 minutes of aerobic exercise without signs/symptoms of physical distress   Intensity THRR unchanged     Progression   Progression Continue to progress workloads to maintain intensity without signs/symptoms of physical distress.     Resistance Training   Training Prescription Yes   Weight green bands   Reps 10-12  10 minutes of strength training     Interval Training   Interval Training No     Bike   Level 0.5   Minutes 17     NuStep   Level 4   Minutes 17   METs 2.5      Functional Capacity:     6 Minute Walk    Row Name 06/08/16 1653         6 Minute Walk   Phase Initial     Distance 900 feet     Walk Time 4.5 minutes     # of Rest Breaks 1  1 minute and 10 seconds     MPH 1.7     METS 2.3     RPE  13     Perceived Dyspnea  3     Symptoms Yes (comment)     Comments lower back pain 3-4/10     Resting HR 92 bpm     Resting BP 142/74     Max Ex. HR 119 bpm     Max Ex. BP 190/86     2 Minute Post BP 144/80       Interval HR   Baseline HR 92     1 Minute HR 105     2 Minute HR 116     3 Minute HR 107     4 Minute HR 110     5 Minute HR 116     6 Minute HR 119     2 Minute Post HR 93     Interval Heart Rate? Yes       Interval Oxygen   Interval Oxygen? Yes     Baseline Oxygen Saturation % 91 %     Baseline Liters of Oxygen 0 L     1 Minute Oxygen Saturation % 93 %     1 Minute Liters of Oxygen 0 L     2 Minute Oxygen Saturation % 90 %     2 Minute Liters of Oxygen 0 L     3 Minute Oxygen Saturation % 92 %     3 Minute Liters of Oxygen 0 L     4 Minute Oxygen Saturation % 93 %     4 Minute Liters of Oxygen 0 L     5 Minute Oxygen Saturation % 91 %     5 Minute Liters of Oxygen 0 L     6 Minute Oxygen Saturation % 91 %     6 Minute Liters of Oxygen 0 L     2 Minute Post Oxygen  Saturation % 97 %     2 Minute Post Liters of Oxygen 0 L        Psychological, QOL, Others - Outcomes: PHQ 2/9: Depression screen [facility] 2/9 06/04/2016 08/04/2015  Decreased Interest 0 0  Down, Depressed, Hopeless 0 0  PHQ - 2 Score 0 0    Quality of Life:   Personal Goals: Goals established at orientation with interventions provided to work toward goal.     Personal Goals and Risk Factors at Admission - 06/04/16 1438      Core Components/Risk Factors/Patient Goals on Admission   Improve shortness of breath with ADL's Yes   Intervention Provide education, individualized exercise plan and daily activity instruction to help decrease symptoms of SOB with activities of daily living.   Expected Outcomes Short Term: Achieves a reduction of symptoms when performing activities of daily living.   Develop more efficient breathing techniques such as purse lipped breathing and diaphragmatic breathing; and practicing self-pacing with activity Yes   Intervention Provide education, demonstration and support about specific breathing techniuqes utilized for more efficient breathing. Include techniques such as pursed lipped breathing, diaphragmatic breathing and self-pacing activity.   Expected Outcomes Short Term: Participant will be able to demonstrate and use breathing techniques as needed throughout daily activities.   Increase knowledge of respiratory medications and ability to use respiratory devices properly  Yes   Intervention Provide education and demonstration as needed of appropriate use of medications, inhalers, and oxygen therapy.   Expected Outcomes Short Term: Achieves understanding of medications use. Understands that oxygen is a medication prescribed by physician. Demonstrates appropriate use of inhaler and oxygen therapy.  Personal Goals Discharge:     Goals and Risk Factor Review    Row Name 07/06/16 1148 08/02/16 1404 08/26/16 1023         Core Components/Risk  Factors/Patient Goals Review   Personal Goals Review Develop more efficient breathing techniques such as purse lipped breathing and diaphragmatic breathing and practicing self-pacing with activity.;Improve shortness of breath with ADL's;Increase knowledge of respiratory medications and ability to use respiratory devices properly. Develop more efficient breathing techniques such as purse lipped breathing and diaphragmatic breathing and practicing self-pacing with activity.;Improve shortness of breath with ADL's;Increase knowledge of respiratory medications and ability to use respiratory devices properly. Develop more efficient breathing techniques such as purse lipped breathing and diaphragmatic breathing and practicing self-pacing with activity.;Improve shortness of breath with ADL's;Increase knowledge of respiratory medications and ability to use respiratory devices properly.     Review just beginning program, has attended 4 exercise sessions, too early to meet program goals. Has only attended 6 exercise sessions, she has been ill, had a death in the family, and the Thanksgiving holiday have prevented her from attending.  Will start progressing this week. Attendance has been irregular, she has been sick with a stomach virus and now is visiting family during [place].  Has not made great progress.     Expected Outcomes expect to see an improvement in goals in the next 30 days. expect better attendance the next 30 days and will hopefully start progressing regularly. Expect poor outcomes due to poor attendance        Nutrition & Weight - Outcomes:    Nutrition:   Nutrition Discharge:     Nutrition Assessments - 09/22/16 1424      Rate Your Plate Scores   Pre Score 52      Education Questionnaire Score:   Goals reviewed with patient; copy given to patient.

## 2016-09-27 NOTE — Addendum Note (Signed)
Encounter addended by: Lance Morin, RN on: 09/27/2016  9:19 AM<BR>    Actions taken: Sign clinical note, Episode resolved

## 2016-09-28 ENCOUNTER — Encounter (HOSPITAL_COMMUNITY): Payer: Commercial Managed Care - HMO

## 2016-09-30 ENCOUNTER — Encounter (HOSPITAL_COMMUNITY): Payer: Commercial Managed Care - HMO

## 2016-11-30 ENCOUNTER — Ambulatory Visit (INDEPENDENT_AMBULATORY_CARE_PROVIDER_SITE_OTHER): Payer: Medicare HMO | Admitting: Adult Health

## 2016-11-30 ENCOUNTER — Encounter: Payer: Self-pay | Admitting: Adult Health

## 2016-11-30 DIAGNOSIS — C3492 Malignant neoplasm of unspecified part of left bronchus or lung: Secondary | ICD-10-CM

## 2016-11-30 DIAGNOSIS — J440 Chronic obstructive pulmonary disease with acute lower respiratory infection: Secondary | ICD-10-CM | POA: Diagnosis not present

## 2016-11-30 MED ORDER — PREDNISONE 10 MG PO TABS: ORAL_TABLET | ORAL | 0 refills | Status: DC

## 2016-11-30 MED ORDER — AMOXICILLIN-POT CLAVULANATE 875-125 MG PO TABS: 1.0000 | ORAL_TABLET | Freq: Two times a day (BID) | ORAL | 0 refills | Status: AC

## 2016-11-30 NOTE — Patient Instructions (Addendum)
Begin Augmentin '875mg'$  Twice daily  For 1 week  Predniosne taper over next week.  Mucinex DM Twice daily  As needed  Cough/congestion  Saline nasal rinses .As needed   Continue on Stiolto .  Follow up for your CT as planned in couple of weeks.  Please contact office for sooner follow up if symptoms do not improve or worsen or seek emergency care  Follow up Dr. Elsworth Soho  In 4 months and As needed

## 2016-11-30 NOTE — Assessment & Plan Note (Signed)
Dx 2016 s/p resection  Cont w/ serial CT follow up and Oncology follow up as planned in 2 weeks .

## 2016-11-30 NOTE — Assessment & Plan Note (Signed)
Acute COPD Exacerbation   Plan  Patient Instructions  Begin Augmentin '875mg'$  Twice daily  For 1 week  Predniosne taper over next week.  Mucinex DM Twice daily  As needed  Cough/congestion  Saline nasal rinses .As needed   Continue on Stiolto .  Follow up for your CT as planned in couple of weeks.  Please contact office for sooner follow up if symptoms do not improve or worsen or seek emergency care  Follow up Dr. Elsworth Soho  In 4 months and As needed

## 2016-11-30 NOTE — Progress Notes (Signed)
$'@Patient'C$  ID: [name], female    DOB: [birth year], 76 y.o.   MRN: [id number]  Chief Complaint  Patient presents with  . Acute Visit    COPD exacerbation    Referring provider: Glendale Chard, MD  HPI: 76 year old  Former smoker (2016)  for FU of gold B COPD. /Lung cancer  She started smoking as a teenager and smoked about 60 pack years. She worked in a [employer]'s [employer] for 20 years until she retired in 85.   In 06/2015 She underwent left VATS with left upper lobectomy and mediastinal lymph node dissection. Path showed invasive adenocarcinoma, adenocarcinoma involving the visceral pleura in addition to lymphovascular invasion. The resection margins as well as the dissected lymph nodes were negative for malignancy. She had persistent air leak postop and required endobronchial valves for resolution.  Significant tests/ events  Pre-op PFT - FEV1 73 %, DLCO 48%  03/2016 FEV1 66%  11/30/2016 Acute OV : COPD  Pt presents for 4 weeks of increased cough, congestion that is thick . Hard to cough up mucus. Taking mucinex without much help. Complains of wheezing, tightness and increased sob. Has nasal congestion and drainage. No fever, chest pain, hemoptysis or edema.  No recent abx.  Remains on Stiolto daily.   Hx of lung cancer 2016 s/p LUL lobectomy/lymph node dissection. CXR in Nov neg. CT chest in Oct w/ no evidence of dz recurrence.  Has planned serial CT in 2 weeks with follow up with Dr. Julien Nordmann.     No Known Allergies  Immunization History  Administered Date(s) Administered  . Influenza, High Dose Seasonal PF 06/06/2016  . Pneumococcal Conjugate-13 07/17/2014  . Pneumococcal Polysaccharide-23 09/07/2011    Past Medical History:  Diagnosis Date  . Arthritis   . Asthma   . COPD (chronic obstructive pulmonary disease) ([place])   . Depression   . GERD (gastroesophageal reflux disease)   . Hyperlipidemia   . Hypertension 06/15/2016  . Shortness of breath   .  Thyroid nodule     Tobacco History: History  Smoking Status  . Former Smoker  . Packs/day: 1.00  . Years: 56.00  . Types: Cigarettes  . Quit date: 06/22/2015  Smokeless Tobacco  . Never Used   Counseling given: Not Answered   Outpatient Encounter Prescriptions as of 11/30/2016  Medication Sig  . albuterol (PROVENTIL HFA;VENTOLIN HFA) 108 (90 Base) MCG/ACT inhaler Inhale 2 puffs into the lungs every 6 (six) hours as needed for wheezing or shortness of breath.  Marland Kitchen aspirin 81 MG tablet Take 81 mg by mouth every evening.   . cyclobenzaprine (FLEXERIL) 5 MG tablet Take 1 tablet (5 mg total) by mouth 3 (three) times daily as needed for muscle spasms.  Marland Kitchen donepezil (ARICEPT) 10 MG tablet Take 10 mg by mouth every evening.  . ezetimibe (ZETIA) 10 MG tablet Take 10 mg by mouth at bedtime.   . Melatonin 2.5 MG CAPS Take 1 capsule by mouth at bedtime as needed.  . mirtazapine (REMERON) 15 MG tablet Take 7.5 mg by mouth at bedtime.   . niacin (NIASPAN) 500 MG CR tablet Take 500 mg by mouth every evening.   . pantoprazole (PROTONIX) 40 MG tablet Take 40 mg by mouth 2 (two) times daily.  . potassium chloride SA (K-DUR,KLOR-CON) 20 MEQ tablet Take 1 tablet (20 mEq total) by mouth 2 (two) times daily.  . RESTASIS 0.05 % ophthalmic emulsion Place 1 drop into both eyes 2 (two) times daily.   . Tiotropium  Bromide-Olodaterol (STIOLTO RESPIMAT) 2.5-2.5 MCG/ACT AERS Inhale 2 puffs into the lungs daily.  . valACYclovir (VALTREX) 500 MG tablet Take 500 mg by mouth daily.  Dema Severin Petrolatum-Mineral Oil (SYSTANE NIGHTTIME OP) Place 1 drop into both eyes at bedtime as needed. For dry eyes  . amoxicillin-clavulanate (AUGMENTIN) 875-125 MG tablet Take 1 tablet by mouth 2 (two) times daily.  . predniSONE (DELTASONE) 10 MG tablet 4 tabs for 2 days, then 3 tabs for 2 days, 2 tabs for 2 days, then 1 tab for 2 days, then stop   No facility-administered encounter medications on file as of 11/30/2016.      Review  of Systems  Constitutional:   No  weight loss, night sweats,  Fevers, chills,  +fatigue, or  lassitude.  HEENT:   No headaches,  Difficulty swallowing,  Tooth/dental problems, or  Sore throat,                No sneezing, itching, ear ache, nasal congestion, post nasal drip,   CV:  No chest pain,  Orthopnea, PND, swelling in lower extremities, anasarca, dizziness, palpitations, syncope.   GI  No heartburn, indigestion, abdominal pain, nausea, vomiting, diarrhea, change in bowel habits, loss of appetite, bloody stools.   Resp:  .  No chest wall deformity  Skin: no rash or lesions.  GU: no dysuria, change in color of urine, no urgency or frequency.  No flank pain, no hematuria   MS:  No joint pain or swelling.  No decreased range of motion.  No back pain.    Physical Exam  BP 128/74 (BP Location: Left Arm, Cuff Size: Normal)   Pulse 75   Ht '5\' 3"'$  (1.6 m)   Wt 153 lb 3.2 oz (69.5 kg)   SpO2 94%   BMI 27.14 kg/m   GEN: A/Ox3; pleasant , NAD, elderly    HEENT:  /AT,  EACs-clear, TMs-wnl, NOSE-clear, THROAT-clear, no lesions, no postnasal drip or exudate noted.   NECK:  Supple w/ fair ROM; no JVD; normal carotid impulses w/o bruits; no thyromegaly or nodules palpated; no lymphadenopathy.    RESP  Few trace rhonchi , no accessory muscle use, no dullness to percussion  CARD:  RRR, no m/r/g, no peripheral edema, pulses intact, no cyanosis or clubbing.  GI:   Soft & nt; nml bowel sounds; no organomegaly or masses detected.   Musco: Warm bil, no deformities or joint swelling noted.   Neuro: alert, no focal deficits noted.    Skin: Warm, no lesions or rashes    Lab Results:  CBC  BNP No results found for: BNP  ProBNP No results found for: PROBNP  Imaging: No results found.   Assessment & Plan:   COPD (chronic obstructive pulmonary disease) Acute COPD Exacerbation   Plan  Patient Instructions  Begin Augmentin '875mg'$  Twice daily  For 1 week  Predniosne  taper over next week.  Mucinex DM Twice daily  As needed  Cough/congestion  Saline nasal rinses .As needed   Continue on Stiolto .  Follow up for your CT as planned in couple of weeks.  Please contact office for sooner follow up if symptoms do not improve or worsen or seek emergency care  Follow up Dr. Elsworth Soho  In 4 months and As needed       Adenocarcinoma of left lung [facility]) Dx 2016 s/p resection  Cont w/ serial CT follow up and Oncology follow up as planned in 2 weeks .  Rexene Edison, NP 11/30/2016

## 2016-12-01 DIAGNOSIS — R197 Diarrhea, unspecified: Secondary | ICD-10-CM | POA: Diagnosis not present

## 2016-12-01 DIAGNOSIS — J209 Acute bronchitis, unspecified: Secondary | ICD-10-CM | POA: Diagnosis not present

## 2016-12-01 DIAGNOSIS — E782 Mixed hyperlipidemia: Secondary | ICD-10-CM | POA: Diagnosis not present

## 2016-12-01 DIAGNOSIS — R7309 Other abnormal glucose: Secondary | ICD-10-CM | POA: Diagnosis not present

## 2016-12-09 NOTE — Progress Notes (Signed)
Reviewed & agree with plan  

## 2016-12-13 ENCOUNTER — Other Ambulatory Visit: Payer: Commercial Managed Care - HMO

## 2016-12-14 ENCOUNTER — Other Ambulatory Visit (HOSPITAL_BASED_OUTPATIENT_CLINIC_OR_DEPARTMENT_OTHER): Payer: Medicare HMO

## 2016-12-14 ENCOUNTER — Ambulatory Visit (HOSPITAL_COMMUNITY)
Admission: RE | Admit: 2016-12-14 | Discharge: 2016-12-14 | Disposition: A | Discharge status: Home / Self Care | Payer: Medicare HMO | Source: Ambulatory Visit | Attending: Internal Medicine | Admitting: Internal Medicine

## 2016-12-14 ENCOUNTER — Encounter (HOSPITAL_COMMUNITY): Payer: Self-pay

## 2016-12-14 DIAGNOSIS — E042 Nontoxic multinodular goiter: Secondary | ICD-10-CM | POA: Diagnosis not present

## 2016-12-14 DIAGNOSIS — I10 Essential (primary) hypertension: Secondary | ICD-10-CM

## 2016-12-14 DIAGNOSIS — I700 Atherosclerosis of aorta: Secondary | ICD-10-CM | POA: Diagnosis not present

## 2016-12-14 DIAGNOSIS — C3492 Malignant neoplasm of unspecified part of left bronchus or lung: Secondary | ICD-10-CM

## 2016-12-14 DIAGNOSIS — C3412 Malignant neoplasm of upper lobe, left bronchus or lung: Secondary | ICD-10-CM

## 2016-12-14 DIAGNOSIS — I2510 Atherosclerotic heart disease of native coronary artery without angina pectoris: Secondary | ICD-10-CM | POA: Diagnosis not present

## 2016-12-14 DIAGNOSIS — J439 Emphysema, unspecified: Secondary | ICD-10-CM | POA: Diagnosis not present

## 2016-12-14 LAB — COMPREHENSIVE METABOLIC PANEL
ALBUMIN: 3.7 g/dL (ref 3.5–5.0)
ALK PHOS: 77 U/L (ref 40–150)
ALT: 31 U/L (ref 0–55)
AST: 24 U/L (ref 5–34)
Anion Gap: 10 mEq/L (ref 3–11)
BUN: 11.9 mg/dL (ref 7.0–26.0)
CALCIUM: 9.4 mg/dL (ref 8.4–10.4)
CO2: 28 mEq/L (ref 22–29)
CREATININE: 1.2 mg/dL — AB (ref 0.6–1.1)
Chloride: 104 mEq/L (ref 98–109)
EGFR: 51 mL/min/{1.73_m2} — ABNORMAL LOW (ref >90)
GLUCOSE: 164 mg/dL — AB (ref 70–140)
POTASSIUM: 3.9 meq/L (ref 3.5–5.1)
Sodium: 142 mEq/L (ref 136–145)
Total Bilirubin: 1.3 mg/dL — ABNORMAL HIGH (ref 0.20–1.20)
Total Protein: 7 g/dL (ref 6.4–8.3)

## 2016-12-14 LAB — CBC WITH DIFFERENTIAL/PLATELET
BASO%: 0.3 % (ref 0.0–2.0)
Basophils Absolute: 0 10*3/uL (ref 0.0–0.1)
EOS ABS: 0.3 10*3/uL (ref 0.0–0.5)
EOS%: 2.8 % (ref 0.0–7.0)
HEMATOCRIT: 40.6 % (ref 34.8–46.6)
HEMOGLOBIN: 13.3 g/dL (ref 11.6–15.9)
LYMPH#: 3.7 10*3/uL — AB (ref 0.9–3.3)
LYMPH%: 38 % (ref 14.0–49.7)
MCH: 30.7 pg (ref 25.1–34.0)
MCHC: 32.8 g/dL (ref 31.5–36.0)
MCV: 93.8 fL (ref 79.5–101.0)
MONO#: 1 10*3/uL — ABNORMAL HIGH (ref 0.1–0.9)
MONO%: 9.9 % (ref 0.0–14.0)
NEUT#: 4.8 10*3/uL (ref 1.5–6.5)
NEUT%: 49 % (ref 38.4–76.8)
Platelets: 207 10*3/uL (ref 145–400)
RBC: 4.33 10*6/uL (ref 3.70–5.45)
RDW: 17.7 % — AB (ref 11.2–14.5)
WBC: 9.8 10*3/uL (ref 3.9–10.3)

## 2016-12-14 MED ORDER — IOPAMIDOL (ISOVUE-300) INJECTION 61%
75.0000 mL | Freq: Once | INTRAVENOUS | Status: AC | PRN
  Administered 2016-12-14: 75 mL via INTRAVENOUS

## 2016-12-14 MED ORDER — IOPAMIDOL (ISOVUE-300) INJECTION 61%
INTRAVENOUS | Status: AC
  Filled 2016-12-14: qty 75

## 2016-12-20 ENCOUNTER — Ambulatory Visit (HOSPITAL_BASED_OUTPATIENT_CLINIC_OR_DEPARTMENT_OTHER): Payer: Medicare HMO | Admitting: Internal Medicine

## 2016-12-20 ENCOUNTER — Encounter: Payer: Self-pay | Admitting: Internal Medicine

## 2016-12-20 ENCOUNTER — Telehealth: Payer: Self-pay | Admitting: Internal Medicine

## 2016-12-20 VITALS — BP 145/75 | HR 81 | Temp 98.1°F | Resp 18 | Ht 63.0 in | Wt 151.4 lb

## 2016-12-20 DIAGNOSIS — R739 Hyperglycemia, unspecified: Secondary | ICD-10-CM

## 2016-12-20 DIAGNOSIS — C3492 Malignant neoplasm of unspecified part of left bronchus or lung: Secondary | ICD-10-CM

## 2016-12-20 DIAGNOSIS — R079 Chest pain, unspecified: Secondary | ICD-10-CM | POA: Diagnosis not present

## 2016-12-20 DIAGNOSIS — R0602 Shortness of breath: Secondary | ICD-10-CM

## 2016-12-20 DIAGNOSIS — C3412 Malignant neoplasm of upper lobe, left bronchus or lung: Secondary | ICD-10-CM | POA: Diagnosis not present

## 2016-12-20 NOTE — Progress Notes (Signed)
[place] Telephone:([phone]) [phone]   [phone]  OFFICE PROGRESS NOTE  [address], [address] [place] [address]  DIAGNOSIS: Stage IB (T2a, N0, M0) non-small cell lung cancer, adenocarcinoma presented with left upper lobe nodule diagnosed in September 2016.  PRIOR THERAPY: Status post left VATS with left upper lobectomy and mediastinal lymph node dissection on 07/04/2015 under the care of Dr. Roxan Hockey.  CURRENT THERAPY: Observation.  INTERVAL HISTORY: [name] 76 y.o. female returns to the clinic today for six-month follow-up visit. The patient is feeling fine with no specific complaints except occasional left-sided chest pain at the site of the surgical scar. She has shortness breath with exertion but no cough or hemoptysis. She has no weight loss or night sweats. She has no fever or chills. She denied having any nausea, vomiting, diarrhea or constipation. She is here today for evaluation with repeat CT scan of the chest.  MEDICAL HISTORY: Past Medical History:  Diagnosis Date  . Arthritis   . Asthma   . COPD (chronic obstructive pulmonary disease) ([place])   . Depression   . GERD (gastroesophageal reflux disease)   . Hyperlipidemia   . Hypertension 06/15/2016  . Shortness of breath   . Thyroid nodule     ALLERGIES:  has No Known Allergies.  MEDICATIONS:  Current Outpatient Prescriptions  Medication Sig Dispense Refill  . albuterol (PROVENTIL HFA;VENTOLIN HFA) 108 (90 Base) MCG/ACT inhaler Inhale 2 puffs into the lungs every 6 (six) hours as needed for wheezing or shortness of breath. 1 Inhaler 11  . AMITIZA 8 MCG capsule Take 8 mcg by mouth daily.    Marland Kitchen aspirin 81 MG tablet Take 81 mg by mouth every evening.     . cyclobenzaprine (FLEXERIL) 5 MG tablet Take 1 tablet (5 mg total) by mouth 3 (three) times daily as needed for muscle spasms. 10 tablet 0  . donepezil (ARICEPT) 10 MG tablet Take 10 mg by mouth every  evening.  2  . ezetimibe (ZETIA) 10 MG tablet Take 10 mg by mouth at bedtime.     . Melatonin 2.5 MG CAPS Take 1 capsule by mouth at bedtime as needed.    . mirtazapine (REMERON) 15 MG tablet Take 7.5 mg by mouth at bedtime.   0  . niacin (NIASPAN) 500 MG CR tablet Take 500 mg by mouth every evening.     . pantoprazole (PROTONIX) 40 MG tablet Take 40 mg by mouth 2 (two) times daily.    . potassium chloride SA (K-DUR,KLOR-CON) 20 MEQ tablet Take 1 tablet (20 mEq total) by mouth 2 (two) times daily. 14 tablet 0  . RESTASIS 0.05 % ophthalmic emulsion Place 1 drop into both eyes 2 (two) times daily.   4  . Tiotropium Bromide-Olodaterol (STIOLTO RESPIMAT) 2.5-2.5 MCG/ACT AERS Inhale 2 puffs into the lungs daily. 3 Inhaler 1  . valACYclovir (VALTREX) 500 MG tablet Take 500 mg by mouth daily.    Dema Severin Petrolatum-Mineral Oil (SYSTANE NIGHTTIME OP) Place 1 drop into both eyes at bedtime as needed. For dry eyes     No current facility-administered medications for this visit.     SURGICAL HISTORY:  Past Surgical History:  Procedure Laterality Date  . BIOPSY THYROID    . CATARACT EXTRACTION     left eye  . CATARACT EXTRACTION W/ INTRAOCULAR LENS IMPLANT     RIGHT  . CESAREAN SECTION    . EYE SURGERY    .  MASS EXCISION N/A 12/13/2014   Procedure: EXCISION OF VOCAL CORD POLYPS;  Surgeon: Jerrell Belfast, MD;  Location: [facility];  Service: ENT;  Laterality: N/A;  . MICROLARYNGOSCOPY N/A 12/13/2014   Procedure: MICROLARYNGOSCOPY;  Surgeon: Jerrell Belfast, MD;  Location: [facility];  Service: ENT;  Laterality: N/A;  . TUBAL LIGATION    . VIDEO ASSISTED THORACOSCOPY (VATS)/ LOBECTOMY Left 07/04/2015   Procedure: VIDEO ASSISTED THORACOSCOPY (VATS) LEFT UPPER LOBECTOMY;  Surgeon: Melrose Nakayama, MD;  Location: [facility];  Service: Thoracic;  Laterality: Left;  Marland Kitchen VIDEO BRONCHOSCOPY WITH INSERTION OF INTERBRONCHIAL VALVE (IBV) N/A 07/11/2015   Procedure: VIDEO BRONCHOSCOPY WITH INSERTION OF INTERBRONCHIAL VALVE  (IBV);  Surgeon: Melrose Nakayama, MD;  Location: [facility];  Service: Thoracic;  Laterality: N/A;  . VIDEO BRONCHOSCOPY WITH INSERTION OF INTERBRONCHIAL VALVE (IBV) N/A 09/10/2015   Procedure: VIDEO BRONCHOSCOPY WITH REMOVAL OF INTERBRONCHIAL VALVES;  Surgeon: Melrose Nakayama, MD;  Location: [facility];  Service: Thoracic;  Laterality: N/A;    REVIEW OF SYSTEMS:  A comprehensive review of systems was negative except for: Respiratory: positive for dyspnea on exertion and pleurisy/chest pain   PHYSICAL EXAMINATION: General appearance: alert, cooperative and no distress Head: Normocephalic, without obvious abnormality, atraumatic Neck: no adenopathy, no JVD, supple, symmetrical, trachea midline and thyroid not enlarged, symmetric, no tenderness/mass/nodules Lymph nodes: Cervical, supraclavicular, and axillary nodes normal. Resp: clear to auscultation bilaterally Back: symmetric, no curvature. ROM normal. No CVA tenderness. Cardio: regular rate and rhythm, S1, S2 normal, no murmur, click, rub or gallop GI: soft, non-tender; bowel sounds normal; no masses,  no organomegaly Extremities: extremities normal, atraumatic, no cyanosis or edema  ECOG PERFORMANCE STATUS: 1 - Symptomatic but completely ambulatory  Blood pressure (!) 145/75, pulse 81, temperature 98.1 F (36.7 C), temperature source Oral, resp. rate 18, height '5\' 3"'$  (1.6 m), weight 151 lb 6.4 oz (68.7 kg), SpO2 94 %.  LABORATORY DATA: Lab Results  Component Value Date   WBC 9.8 12/14/2016   HGB 13.3 12/14/2016   HCT 40.6 12/14/2016   MCV 93.8 12/14/2016   PLT 207 12/14/2016      Chemistry      Component Value Date/Time   NA 142 12/14/2016 0921   K 3.9 12/14/2016 0921   CL 107 07/16/2016 1610   CO2 28 12/14/2016 0921   BUN 11.9 12/14/2016 0921   CREATININE 1.2 (H) 12/14/2016 0921      Component Value Date/Time   CALCIUM 9.4 12/14/2016 0921   ALKPHOS 77 12/14/2016 0921   AST 24 12/14/2016 0921   ALT 31 12/14/2016 0921    BILITOT 1.30 (H) 12/14/2016 0921       RADIOGRAPHIC STUDIES: Ct Chest W Contrast  Result Date: 12/14/2016 CLINICAL DATA:  Stage IB left upper lobe lung adenocarcinoma diagnosed September 2016 status post left upper lobectomy. Patient presents for restaging on interval observation . EXAM: CT CHEST WITH CONTRAST TECHNIQUE: Multidetector CT imaging of the chest was performed during intravenous contrast administration. CONTRAST:  97m ISOVUE-300 IOPAMIDOL (ISOVUE-300) INJECTION 61% COMPARISON:  06/10/2016 chest CT. FINDINGS: Cardiovascular: Normal heart size. No significant pericardial fluid/thickening. Left anterior descending and right coronary atherosclerosis. Atherosclerotic nonaneurysmal thoracic aorta. Normal caliber pulmonary arteries . No central pulmonary emboli. Mediastinum/Nodes: Stable multinodular goiter with dominant heterogeneous hypodense 2.0 cm posterior right thyroid lobe nodule. Unremarkable esophagus. No pathologically enlarged axillary, mediastinal or hilar lymph nodes. Lungs/Pleura: No pneumothorax. No pleural effusion. Status post left upper lobectomy. Moderate centrilobular emphysema with diffuse bronchial wall thickening. Peripheral apical  right upper lobe 3 mm pulmonary nodule (series 5/ image 26) is stable since 05/26/2015 and considered benign. Peripheral left lower lobe 1.8 x 0.9 cm nodular pleural based opacity (series 5/ image 100), previously 2.0 x 0.9 cm on 06/10/2016 and 1.7 x 1.1 cm on 09/24/2015, not appreciably changed. No acute consolidative airspace disease or new significant pulmonary nodules. Upper abdomen: Unremarkable. Musculoskeletal: No aggressive appearing focal osseous lesions. Mild thoracic spondylosis. IMPRESSION: 1. No findings suspicious for local tumor recurrence status post left upper lobectomy. Peripheral nodular left lower lobe opacity is stable back to 09/24/2015 and most compatible with benign postsurgical scarring. 2. No evidence of metastatic disease in  the chest. 3. Aortic atherosclerosis.  Two-vessel coronary atherosclerosis. 4. Moderate emphysema with diffuse bronchial wall thickening, suggesting COPD. 5. Stable multinodular goiter. Electronically Signed   By: Ilona Sorrel M.D.   On: 12/14/2016 13:41    ASSESSMENT AND PLAN:  This is a very pleasant 76 years old African-American female with a stage IB non-small cell lung cancer status post right upper lobectomy with lymph node dissection in September 2016. The patient is currently on observation and she is feeling fine except for mild shortness of breath and intermittent pain on the left side of the chest at the site of the surgical scar. She had repeat CT scan of the chest performed recently. Her scan showed no evidence for disease recurrence. I discussed the scan results with the patient and her family member and recommended for her to continue on observation with repeat CT scan of the chest in 6 months. For the hyperglycemia, I recommended for the patient to follow-up with her primary care physician for close monitoring of her blood sugar. She was advised to call immediately if she has any concerning symptoms in the interval. The patient voices understanding of current disease status and treatment options and is in agreement with the current care plan. All questions were answered. The patient knows to call the clinic with any problems, questions or concerns. We can certainly see the patient much sooner if necessary. I spent 10 minutes counseling the patient face to face. The total time spent in the appointment was 15 minutes.  Disclaimer: This note was dictated with voice recognition software. Similar sounding words can inadvertently be transcribed and may not be corrected upon review.

## 2016-12-20 NOTE — Telephone Encounter (Signed)
Appointments scheduled per 4.16.18 LOS. Patient given AVS report and calendars with future scheduled appointments.

## 2017-02-08 DIAGNOSIS — M47896 Other spondylosis, lumbar region: Secondary | ICD-10-CM | POA: Diagnosis not present

## 2017-02-10 DIAGNOSIS — H35033 Hypertensive retinopathy, bilateral: Secondary | ICD-10-CM | POA: Diagnosis not present

## 2017-02-10 DIAGNOSIS — H35361 Drusen (degenerative) of macula, right eye: Secondary | ICD-10-CM | POA: Diagnosis not present

## 2017-02-10 DIAGNOSIS — Z961 Presence of intraocular lens: Secondary | ICD-10-CM | POA: Diagnosis not present

## 2017-02-10 DIAGNOSIS — H04123 Dry eye syndrome of bilateral lacrimal glands: Secondary | ICD-10-CM | POA: Diagnosis not present

## 2017-03-07 ENCOUNTER — Other Ambulatory Visit: Payer: Self-pay | Admitting: Pulmonary Disease

## 2017-04-04 ENCOUNTER — Ambulatory Visit (INDEPENDENT_AMBULATORY_CARE_PROVIDER_SITE_OTHER): Payer: Medicare HMO | Admitting: Pulmonary Disease

## 2017-04-04 ENCOUNTER — Encounter: Payer: Self-pay | Admitting: Pulmonary Disease

## 2017-04-04 DIAGNOSIS — C3492 Malignant neoplasm of unspecified part of left bronchus or lung: Secondary | ICD-10-CM

## 2017-04-04 DIAGNOSIS — J449 Chronic obstructive pulmonary disease, unspecified: Secondary | ICD-10-CM

## 2017-04-04 MED ORDER — FLUTICASONE-UMECLIDIN-VILANT 100-62.5-25 MCG/INH IN AEPB: 1.0000 | INHALATION_SPRAY | Freq: Every day | RESPIRATORY_TRACT | 0 refills | Status: DC

## 2017-04-04 NOTE — Assessment & Plan Note (Signed)
Trial of Trelegy instead of Stiolto Continue albuterol on an as-needed basis

## 2017-04-04 NOTE — Addendum Note (Signed)
Addended by: Valerie Salts on: 04/04/2017 03:41 PM   Modules accepted: Orders

## 2017-04-04 NOTE — Patient Instructions (Signed)
Trial of Trelegy instead of Stiolto Call me for prescription if this works

## 2017-04-04 NOTE — Progress Notes (Signed)
   Subjective:    Patient ID: [name], female    DOB: 1941-05-27, 76 y.o.   MRN: [id number]  HPI   76 year old smoker for FU of gold B COPD.  She started smoking as a teenager and smoked about 60 pack years. She worked in a [employer] for 20 years until she retired in 33.   In 06/2015 She underwent left VATS with left upper lobectomy and mediastinal lymph node dissection. Path showed invasive adenocarcinoma, adenocarcinoma involving the visceral pleura in addition to lymphovascular invasion. The resection margins as well as the dissected lymph nodes were negative for malignancy. She had persistent air leak postop and required endobronchial valves for resolution.   Chief Complaint  Patient presents with  . Follow-up    4 month follow up. Breathing has been the same since the last visit.    Her dyspnea seems slightly worse, she uses albuterol sometimes twice a day. She is barely able to walk back from the mailbox. She is compliant with stiolto. She denies excessive sputum production or wheezing or chest colds. Last exacerbation was 12/2016 requiring Augmentin. CT chest from 12/2016 was reviewed which shows stable scarring and no evidence of recurrence of malignancy She has gained weight over the last 3 years steadily since he quit smoking   Significant tests/ events  Pre-op PFT - FEV1 73 %, DLCO 48%  03/2016 FEV1 66%    Review of Systems Patient denies significant dyspnea,cough, hemoptysis,  chest pain, palpitations, pedal edema, orthopnea, paroxysmal nocturnal dyspnea, lightheadedness, nausea, vomiting, abdominal or  leg pains      Objective:   Physical Exam  Gen. Pleasant, well-nourished, in no distress ENT - no thrush, no post nasal drip Neck: No JVD, no thyromegaly, no carotid bruits Lungs: no use of accessory muscles, no dullness to percussion, decreased without rales or rhonchi  Cardiovascular: Rhythm regular, heart sounds  normal, no murmurs or  gallops, no peripheral edema Musculoskeletal: No deformities, no cyanosis or clubbing        Assessment & Plan:

## 2017-04-04 NOTE — Assessment & Plan Note (Signed)
Surveillance CT scan planned for October

## 2017-04-14 ENCOUNTER — Telehealth: Payer: Self-pay | Admitting: Pulmonary Disease

## 2017-04-14 MED ORDER — FLUTICASONE-UMECLIDIN-VILANT 100-62.5-25 MCG/INH IN AEPB: 1.0000 | INHALATION_SPRAY | Freq: Every day | RESPIRATORY_TRACT | 5 refills | Status: DC

## 2017-04-14 NOTE — Telephone Encounter (Signed)
Spoke with pt. She is needing a prescription for Trelegy. This has been sent in. Nothing further was needed.

## 2017-06-08 DIAGNOSIS — R413 Other amnesia: Secondary | ICD-10-CM | POA: Diagnosis not present

## 2017-06-08 DIAGNOSIS — E782 Mixed hyperlipidemia: Secondary | ICD-10-CM | POA: Diagnosis not present

## 2017-06-08 DIAGNOSIS — Z1239 Encounter for other screening for malignant neoplasm of breast: Secondary | ICD-10-CM | POA: Diagnosis not present

## 2017-06-08 DIAGNOSIS — C3490 Malignant neoplasm of unspecified part of unspecified bronchus or lung: Secondary | ICD-10-CM | POA: Diagnosis not present

## 2017-06-08 DIAGNOSIS — E559 Vitamin D deficiency, unspecified: Secondary | ICD-10-CM | POA: Diagnosis not present

## 2017-06-08 DIAGNOSIS — M545 Low back pain: Secondary | ICD-10-CM | POA: Diagnosis not present

## 2017-06-08 DIAGNOSIS — Z0000 Encounter for general adult medical examination without abnormal findings: Secondary | ICD-10-CM | POA: Diagnosis not present

## 2017-06-08 DIAGNOSIS — R7303 Prediabetes: Secondary | ICD-10-CM | POA: Diagnosis not present

## 2017-06-08 DIAGNOSIS — R252 Cramp and spasm: Secondary | ICD-10-CM | POA: Diagnosis not present

## 2017-06-12 DIAGNOSIS — M545 Low back pain: Secondary | ICD-10-CM | POA: Diagnosis not present

## 2017-06-12 DIAGNOSIS — G8929 Other chronic pain: Secondary | ICD-10-CM | POA: Diagnosis not present

## 2017-06-12 DIAGNOSIS — Z7982 Long term (current) use of aspirin: Secondary | ICD-10-CM | POA: Diagnosis not present

## 2017-06-12 DIAGNOSIS — Z9181 History of falling: Secondary | ICD-10-CM | POA: Diagnosis not present

## 2017-06-12 DIAGNOSIS — E785 Hyperlipidemia, unspecified: Secondary | ICD-10-CM | POA: Diagnosis not present

## 2017-06-12 DIAGNOSIS — J449 Chronic obstructive pulmonary disease, unspecified: Secondary | ICD-10-CM | POA: Diagnosis not present

## 2017-06-12 DIAGNOSIS — F0390 Unspecified dementia without behavioral disturbance: Secondary | ICD-10-CM | POA: Diagnosis not present

## 2017-06-12 DIAGNOSIS — M6281 Muscle weakness (generalized): Secondary | ICD-10-CM | POA: Diagnosis not present

## 2017-06-15 DIAGNOSIS — Z9181 History of falling: Secondary | ICD-10-CM | POA: Diagnosis not present

## 2017-06-15 DIAGNOSIS — M6281 Muscle weakness (generalized): Secondary | ICD-10-CM | POA: Diagnosis not present

## 2017-06-15 DIAGNOSIS — Z7982 Long term (current) use of aspirin: Secondary | ICD-10-CM | POA: Diagnosis not present

## 2017-06-15 DIAGNOSIS — J449 Chronic obstructive pulmonary disease, unspecified: Secondary | ICD-10-CM | POA: Diagnosis not present

## 2017-06-15 DIAGNOSIS — E785 Hyperlipidemia, unspecified: Secondary | ICD-10-CM | POA: Diagnosis not present

## 2017-06-15 DIAGNOSIS — G8929 Other chronic pain: Secondary | ICD-10-CM | POA: Diagnosis not present

## 2017-06-15 DIAGNOSIS — F0390 Unspecified dementia without behavioral disturbance: Secondary | ICD-10-CM | POA: Diagnosis not present

## 2017-06-15 DIAGNOSIS — M545 Low back pain: Secondary | ICD-10-CM | POA: Diagnosis not present

## 2017-06-17 ENCOUNTER — Telehealth: Payer: Self-pay | Admitting: Internal Medicine

## 2017-06-17 NOTE — Telephone Encounter (Signed)
Scheduled appt per 10/11 sch message - patient is aware,.

## 2017-06-20 ENCOUNTER — Ambulatory Visit (HOSPITAL_COMMUNITY)
Admission: RE | Admit: 2017-06-20 | Discharge: 2017-06-20 | Disposition: A | Discharge status: Home / Self Care | Payer: Medicare HMO | Source: Ambulatory Visit | Attending: Internal Medicine | Admitting: Internal Medicine

## 2017-06-20 ENCOUNTER — Encounter (HOSPITAL_COMMUNITY): Payer: Self-pay

## 2017-06-20 ENCOUNTER — Other Ambulatory Visit: Payer: Medicare HMO

## 2017-06-20 ENCOUNTER — Other Ambulatory Visit (HOSPITAL_BASED_OUTPATIENT_CLINIC_OR_DEPARTMENT_OTHER): Payer: Medicare HMO

## 2017-06-20 DIAGNOSIS — Z902 Acquired absence of lung [part of]: Secondary | ICD-10-CM | POA: Diagnosis not present

## 2017-06-20 DIAGNOSIS — I2510 Atherosclerotic heart disease of native coronary artery without angina pectoris: Secondary | ICD-10-CM | POA: Insufficient documentation

## 2017-06-20 DIAGNOSIS — I700 Atherosclerosis of aorta: Secondary | ICD-10-CM | POA: Insufficient documentation

## 2017-06-20 DIAGNOSIS — J432 Centrilobular emphysema: Secondary | ICD-10-CM | POA: Insufficient documentation

## 2017-06-20 DIAGNOSIS — C3492 Malignant neoplasm of unspecified part of left bronchus or lung: Secondary | ICD-10-CM | POA: Diagnosis not present

## 2017-06-20 DIAGNOSIS — R0602 Shortness of breath: Secondary | ICD-10-CM | POA: Diagnosis not present

## 2017-06-20 DIAGNOSIS — C3412 Malignant neoplasm of upper lobe, left bronchus or lung: Secondary | ICD-10-CM

## 2017-06-20 LAB — COMPREHENSIVE METABOLIC PANEL
ALK PHOS: 74 U/L (ref 40–150)
ALT: 30 U/L (ref 0–55)
AST: 35 U/L — AB (ref 5–34)
Albumin: 4 g/dL (ref 3.5–5.0)
Anion Gap: 8 mEq/L (ref 3–11)
BUN: 16 mg/dL (ref 7.0–26.0)
CO2: 29 meq/L (ref 22–29)
Calcium: 9.8 mg/dL (ref 8.4–10.4)
Chloride: 105 mEq/L (ref 98–109)
Creatinine: 1.2 mg/dL — ABNORMAL HIGH (ref 0.6–1.1)
EGFR: 53 mL/min/{1.73_m2} — AB (ref >60)
Glucose: 123 mg/dl (ref 70–140)
POTASSIUM: 4 meq/L (ref 3.5–5.1)
SODIUM: 142 meq/L (ref 136–145)
Total Bilirubin: 0.94 mg/dL (ref 0.20–1.20)
Total Protein: 7.5 g/dL (ref 6.4–8.3)

## 2017-06-20 LAB — CBC WITH DIFFERENTIAL/PLATELET
BASO%: 0.6 % (ref 0.0–2.0)
BASOS ABS: 0 10*3/uL (ref 0.0–0.1)
EOS%: 1.1 % (ref 0.0–7.0)
Eosinophils Absolute: 0.1 10*3/uL (ref 0.0–0.5)
HCT: 42.8 % (ref 34.8–46.6)
HGB: 14.1 g/dL (ref 11.6–15.9)
LYMPH%: 36.2 % (ref 14.0–49.7)
MCH: 31.6 pg (ref 25.1–34.0)
MCHC: 33 g/dL (ref 31.5–36.0)
MCV: 95.7 fL (ref 79.5–101.0)
MONO#: 0.5 10*3/uL (ref 0.1–0.9)
MONO%: 7.7 % (ref 0.0–14.0)
NEUT%: 54.4 % (ref 38.4–76.8)
NEUTROS ABS: 3.2 10*3/uL (ref 1.5–6.5)
Platelets: 226 10*3/uL (ref 145–400)
RBC: 4.47 10*6/uL (ref 3.70–5.45)
RDW: 15.9 % — AB (ref 11.2–14.5)
WBC: 5.9 10*3/uL (ref 3.9–10.3)
lymph#: 2.1 10*3/uL (ref 0.9–3.3)

## 2017-06-20 MED ORDER — IOPAMIDOL (ISOVUE-300) INJECTION 61%
INTRAVENOUS | Status: AC
  Filled 2017-06-20: qty 75

## 2017-06-20 MED ORDER — IOPAMIDOL (ISOVUE-300) INJECTION 61%
75.0000 mL | Freq: Once | INTRAVENOUS | Status: AC | PRN
  Administered 2017-06-20: 75 mL via INTRAVENOUS

## 2017-06-21 ENCOUNTER — Encounter: Payer: Self-pay | Admitting: Thoracic Surgery (Cardiothoracic Vascular Surgery)

## 2017-06-21 ENCOUNTER — Ambulatory Visit (INDEPENDENT_AMBULATORY_CARE_PROVIDER_SITE_OTHER): Payer: Medicare HMO | Admitting: Thoracic Surgery (Cardiothoracic Vascular Surgery)

## 2017-06-21 VITALS — BP 138/83 | HR 79 | Ht 63.0 in | Wt 160.0 lb

## 2017-06-21 DIAGNOSIS — Z7982 Long term (current) use of aspirin: Secondary | ICD-10-CM | POA: Diagnosis not present

## 2017-06-21 DIAGNOSIS — C3492 Malignant neoplasm of unspecified part of left bronchus or lung: Secondary | ICD-10-CM

## 2017-06-21 DIAGNOSIS — J449 Chronic obstructive pulmonary disease, unspecified: Secondary | ICD-10-CM | POA: Diagnosis not present

## 2017-06-21 DIAGNOSIS — Z9181 History of falling: Secondary | ICD-10-CM | POA: Diagnosis not present

## 2017-06-21 DIAGNOSIS — M545 Low back pain: Secondary | ICD-10-CM | POA: Diagnosis not present

## 2017-06-21 DIAGNOSIS — M6281 Muscle weakness (generalized): Secondary | ICD-10-CM | POA: Diagnosis not present

## 2017-06-21 DIAGNOSIS — F0390 Unspecified dementia without behavioral disturbance: Secondary | ICD-10-CM | POA: Diagnosis not present

## 2017-06-21 DIAGNOSIS — G8929 Other chronic pain: Secondary | ICD-10-CM | POA: Diagnosis not present

## 2017-06-21 DIAGNOSIS — E785 Hyperlipidemia, unspecified: Secondary | ICD-10-CM | POA: Diagnosis not present

## 2017-06-21 NOTE — Progress Notes (Signed)
[address]       [place] [address]             [phone]    HPI: [name] returns for a scheduled 2 year follow up visit  [name] is a 76 year old woman who had a thoracoscopic left upper lobectomy for stage IB adenocarcinoma in October 2016. She had a prolonged air leak postoperatively which required endobronchial valve placement. She did not require adjuvant therapy.  I last saw in the office a year ago. She was doing well at that time with no evidence recurrent disease. She did complain of muscle cramping associated with her incision.  She recently has had some sinus congestion and cough. She still has what she describes as muscle spasms or cramps in her left chest. She also complains of some tightness in the center of her chest. This is usually but not always exertional. Her appetite is good and she has not had any weight loss.  She quit smoking in October 2016. Past Medical History:  Diagnosis Date  . Arthritis   . Asthma   . COPD (chronic obstructive pulmonary disease) ([place])   . Depression   . GERD (gastroesophageal reflux disease)   . Hyperlipidemia   . Hypertension 06/15/2016  . Shortness of breath   . Thyroid nodule     Current Outpatient Prescriptions  Medication Sig Dispense Refill  . albuterol (PROVENTIL HFA;VENTOLIN HFA) 108 (90 Base) MCG/ACT inhaler Inhale 2 puffs into the lungs every 6 (six) hours as needed for wheezing or shortness of breath. 1 Inhaler 11  . AMITIZA 8 MCG capsule Take 8 mcg by mouth daily.    Marland Kitchen aspirin 81 MG tablet Take 81 mg by mouth every evening.     . cyclobenzaprine (FLEXERIL) 5 MG tablet Take 1 tablet (5 mg total) by mouth 3 (three) times daily as needed for muscle spasms. 10 tablet 0  . donepezil (ARICEPT) 10 MG tablet Take 10 mg by mouth every evening.  2  . ezetimibe (ZETIA) 10 MG tablet Take 10 mg by mouth at bedtime.     . Fluticasone-Umeclidin-Vilant (TRELEGY ELLIPTA) 100-62.5-25 MCG/INH AEPB Inhale  1 puff into the lungs daily. 60 each 5  . Melatonin 2.5 MG CAPS Take 1 capsule by mouth at bedtime as needed.    . mirtazapine (REMERON) 15 MG tablet Take 7.5 mg by mouth at bedtime.   0  . niacin (NIASPAN) 500 MG CR tablet Take 500 mg by mouth every evening.     . pantoprazole (PROTONIX) 40 MG tablet Take 40 mg by mouth 2 (two) times daily.    . potassium chloride SA (K-DUR,KLOR-CON) 20 MEQ tablet Take 1 tablet (20 mEq total) by mouth 2 (two) times daily. 14 tablet 0  . RESTASIS 0.05 % ophthalmic emulsion Place 1 drop into both eyes 2 (two) times daily.   4  . STIOLTO RESPIMAT 2.5-2.5 MCG/ACT AERS INHALE 2 PUFFS INTO THE LUNGS DAILY. 1 Inhaler 1  . valACYclovir (VALTREX) 500 MG tablet Take 500 mg by mouth daily.    Dema Severin Petrolatum-Mineral Oil (SYSTANE NIGHTTIME OP) Place 1 drop into both eyes at bedtime as needed. For dry eyes    . FLUZONE HIGH-DOSE 0.5 ML injection TO BE ADMINISTERED BY PHARMACIST FOR IMMUNIZATION  0   No current facility-administered medications for this visit.     Physical Exam BP 138/83   Pulse 79   Ht 5\' 3"  (1.6 m)   Wt  160 lb (72.6 kg)   SpO2 95%   BMI 28.56 kg/m  76 year old woman in no acute distress Alert and oriented x 3 with no focal deficits Cardiac regular rate and rhythm normal S1 and S2 Lungs slightly diminished at left base, otherwise clear No cervical or supraclavicular adenopathy  Diagnostic Tests: CT CHEST WITH CONTRAST  TECHNIQUE: Multidetector CT imaging of the chest was performed during intravenous contrast administration.  CONTRAST:  36mL ISOVUE-300 IOPAMIDOL (ISOVUE-300) INJECTION 61%  COMPARISON:  Chest CT 12/14/2016.  Chest x-ray 07/16/2016.  FINDINGS: Cardiovascular: Heart size is normal. There is no significant pericardial fluid, thickening or pericardial calcification. There is aortic atherosclerosis, as well as atherosclerosis of the great vessels of the mediastinum and the coronary arteries, including calcified  atherosclerotic plaque in the right coronary artery.  Mediastinum/Nodes: Borderline enlarged right hilar lymph node measuring 9 mm in short axis, similar to multiple prior examinations, presumably benign. No other mediastinal or left hilar lymphadenopathy noted. Esophagus is unremarkable in appearance. No axillary lymphadenopathy.  Lungs/Pleura: Status post left upper lobectomy. Compensatory hyperexpansion of the left lower lobe. Persistent nodular scarring and architectural distortion in the periphery of the left lower lobe which measures approximately 19 x 8 mm (axial image 103 of series 5), stable compared to prior examinations. No other new suspicious appearing pulmonary nodules or masses are noted. No acute consolidative airspace disease. No pleural effusions. Mild diffuse bronchial wall thickening with moderate centrilobular and mild paraseptal emphysema.  Upper Abdomen: Aortic atherosclerosis.  Musculoskeletal: There are no aggressive appearing lytic or blastic lesions noted in the visualized portions of the skeleton.  IMPRESSION: 1. Stable appearance of the chest following prior left upper lobectomy. No evidence to suggest local recurrence of disease or metastatic disease in the thorax. 2. Stable scarring in the periphery of the left lower lobe is unchanged over several prior examinations, considered benign. 3. Aortic atherosclerosis, in addition to right coronary artery disease. Assessment for potential risk factor modification, dietary therapy or pharmacologic therapy may be warranted, if clinically indicated. 4. Diffuse bronchial wall thickening with moderate centrilobular and mild paraseptal emphysema; imaging findings suggestive of underlying COPD. 5. Additional incidental findings, as above.  Aortic Atherosclerosis (ICD10-I70.0) and Emphysema (ICD10-J43.9).   Electronically Signed   By: Vinnie Langton M.D.   On: 06/20/2017 16:48 I personally  reviewed the CT images and concur with the findings noted above  Impression: [name] is a 76 year old former smoker who had a thoracoscopic left upper lobectomy for stage Ib adenocarcinoma in October 2016. She has now 2 years out from surgery with no evidence of recurrent disease.  She continues to have some muscle cramping and spasms in her left chest. It seems highly unusual this far out from surgery. The discomfort she describes is not really consistent with paresthesias. She has been using Flexeril for that the times that it doesn't go away immediately.She also has some tightness in her chest. This is not clearly exertional. She does have multiple cardiac risk factors. Recommended that she talk to Dr. Baird Cancer to see if a cardiology workup her referral would be appropriate.  Plan: Follow-up is scheduled with Dr. Julien Nordmann  I will see her back in a year  Melrose Nakayama, MD [facility] [phone]

## 2017-06-22 DIAGNOSIS — Z9181 History of falling: Secondary | ICD-10-CM | POA: Diagnosis not present

## 2017-06-22 DIAGNOSIS — F0390 Unspecified dementia without behavioral disturbance: Secondary | ICD-10-CM | POA: Diagnosis not present

## 2017-06-22 DIAGNOSIS — E785 Hyperlipidemia, unspecified: Secondary | ICD-10-CM | POA: Diagnosis not present

## 2017-06-22 DIAGNOSIS — M6281 Muscle weakness (generalized): Secondary | ICD-10-CM | POA: Diagnosis not present

## 2017-06-22 DIAGNOSIS — G8929 Other chronic pain: Secondary | ICD-10-CM | POA: Diagnosis not present

## 2017-06-22 DIAGNOSIS — J449 Chronic obstructive pulmonary disease, unspecified: Secondary | ICD-10-CM | POA: Diagnosis not present

## 2017-06-22 DIAGNOSIS — M545 Low back pain: Secondary | ICD-10-CM | POA: Diagnosis not present

## 2017-06-22 DIAGNOSIS — Z7982 Long term (current) use of aspirin: Secondary | ICD-10-CM | POA: Diagnosis not present

## 2017-06-23 ENCOUNTER — Ambulatory Visit (HOSPITAL_BASED_OUTPATIENT_CLINIC_OR_DEPARTMENT_OTHER): Payer: Medicare HMO | Admitting: Internal Medicine

## 2017-06-23 ENCOUNTER — Encounter: Payer: Self-pay | Admitting: Internal Medicine

## 2017-06-23 ENCOUNTER — Telehealth: Payer: Self-pay | Admitting: Internal Medicine

## 2017-06-23 VITALS — BP 153/73 | HR 70 | Temp 98.0°F | Resp 19 | Ht 63.0 in | Wt 160.6 lb

## 2017-06-23 DIAGNOSIS — R252 Cramp and spasm: Secondary | ICD-10-CM

## 2017-06-23 DIAGNOSIS — R0609 Other forms of dyspnea: Secondary | ICD-10-CM

## 2017-06-23 DIAGNOSIS — G8929 Other chronic pain: Secondary | ICD-10-CM | POA: Diagnosis not present

## 2017-06-23 DIAGNOSIS — M6281 Muscle weakness (generalized): Secondary | ICD-10-CM | POA: Diagnosis not present

## 2017-06-23 DIAGNOSIS — Z9181 History of falling: Secondary | ICD-10-CM | POA: Diagnosis not present

## 2017-06-23 DIAGNOSIS — Z7982 Long term (current) use of aspirin: Secondary | ICD-10-CM | POA: Diagnosis not present

## 2017-06-23 DIAGNOSIS — C3411 Malignant neoplasm of upper lobe, right bronchus or lung: Secondary | ICD-10-CM | POA: Diagnosis not present

## 2017-06-23 DIAGNOSIS — M545 Low back pain: Secondary | ICD-10-CM | POA: Diagnosis not present

## 2017-06-23 DIAGNOSIS — R5383 Other fatigue: Secondary | ICD-10-CM | POA: Diagnosis not present

## 2017-06-23 DIAGNOSIS — C3490 Malignant neoplasm of unspecified part of unspecified bronchus or lung: Secondary | ICD-10-CM

## 2017-06-23 DIAGNOSIS — F0390 Unspecified dementia without behavioral disturbance: Secondary | ICD-10-CM | POA: Diagnosis not present

## 2017-06-23 DIAGNOSIS — J449 Chronic obstructive pulmonary disease, unspecified: Secondary | ICD-10-CM | POA: Diagnosis not present

## 2017-06-23 DIAGNOSIS — C3492 Malignant neoplasm of unspecified part of left bronchus or lung: Secondary | ICD-10-CM

## 2017-06-23 DIAGNOSIS — I10 Essential (primary) hypertension: Secondary | ICD-10-CM

## 2017-06-23 DIAGNOSIS — E785 Hyperlipidemia, unspecified: Secondary | ICD-10-CM | POA: Diagnosis not present

## 2017-06-23 NOTE — Progress Notes (Signed)
[place] [phone]   [phone]  OFFICE PROGRESS NOTE  [address], [address] [place] [address]  DIAGNOSIS: Stage IB (T2a, N0, M0) non-small cell lung cancer, adenocarcinoma presented with left upper lobe nodule diagnosed in September 2016.  PRIOR THERAPY: Status post left VATS with left upper lobectomy and mediastinal lymph node dissection on 07/04/2015 under the care of Dr. Roxan Hockey.  CURRENT THERAPY: Observation.  INTERVAL HISTORY: [name] 76 y.o. female returns to the clinic today for follow-up visit. The patient is feeling fine today with no specific complaints except for fatigue as well as cramps in her upper and lower extremities and shortness of breath with exertion. She denied having any significant chest pain, cough or hemoptysis. She denied having any fever or chills. The patient denied having any nausea, vomiting, diarrhea or constipation. She had repeat CT scan of the chest performed recently and she is here for evaluation and discussion of her scan results.  MEDICAL HISTORY: Past Medical History:  Diagnosis Date  . Arthritis   . Asthma   . COPD (chronic obstructive pulmonary disease) ([place])   . Depression   . GERD (gastroesophageal reflux disease)   . Hyperlipidemia   . Hypertension 06/15/2016  . Shortness of breath   . Thyroid nodule     ALLERGIES:  has No Known Allergies.  MEDICATIONS:  Current Outpatient Prescriptions  Medication Sig Dispense Refill  . albuterol (PROVENTIL HFA;VENTOLIN HFA) 108 (90 Base) MCG/ACT inhaler Inhale 2 puffs into the lungs every 6 (six) hours as needed for wheezing or shortness of breath. 1 Inhaler 11  . aspirin 81 MG tablet Take 81 mg by mouth every evening.     Marland Kitchen co-enzyme Q-10 30 MG capsule Take 30 mg by mouth daily.    . cyclobenzaprine (FLEXERIL) 5 MG tablet Take 1 tablet (5 mg total) by mouth 3 (three) times daily as needed for muscle spasms. 10  tablet 0  . donepezil (ARICEPT) 10 MG tablet Take 10 mg by mouth every evening.  2  . ezetimibe (ZETIA) 10 MG tablet Take 10 mg by mouth at bedtime.     . Fluticasone-Umeclidin-Vilant (TRELEGY ELLIPTA) 100-62.5-25 MCG/INH AEPB Inhale 1 puff into the lungs daily. 60 each 5  . Magnesium Oxide (MAG-200) 200 MG TABS Take 1 tablet by mouth daily.    . Melatonin 2.5 MG CAPS Take 1 capsule by mouth at bedtime as needed.    . mirtazapine (REMERON) 15 MG tablet Take 7.5 mg by mouth at bedtime.   0  . Multiple Vitamin (MULTIVITAMIN) tablet Take 1 tablet by mouth daily.    . niacin (NIASPAN) 500 MG CR tablet Take 500 mg by mouth every evening.     Marland Kitchen OVER THE COUNTER MEDICATION Take 1 tablet by mouth daily.    . pantoprazole (PROTONIX) 40 MG tablet Take 40 mg by mouth 2 (two) times daily.    . potassium chloride SA (K-DUR,KLOR-CON) 20 MEQ tablet Take 1 tablet (20 mEq total) by mouth 2 (two) times daily. 14 tablet 0  . RESTASIS 0.05 % ophthalmic emulsion Place 1 drop into both eyes 2 (two) times daily.   4  . valACYclovir (VALTREX) 500 MG tablet Take 500 mg by mouth daily.    Dema Severin Petrolatum-Mineral Oil (SYSTANE NIGHTTIME OP) Place 1 drop into both eyes at bedtime as needed. For dry eyes     No current facility-administered medications for this visit.  SURGICAL HISTORY:  Past Surgical History:  Procedure Laterality Date  . BIOPSY THYROID    . CATARACT EXTRACTION     left eye  . CATARACT EXTRACTION W/ INTRAOCULAR LENS IMPLANT     RIGHT  . CESAREAN SECTION    . EYE SURGERY    . MASS EXCISION N/A 12/13/2014   Procedure: EXCISION OF VOCAL CORD POLYPS;  Surgeon: Jerrell Belfast, MD;  Location: [facility];  Service: ENT;  Laterality: N/A;  . MICROLARYNGOSCOPY N/A 12/13/2014   Procedure: MICROLARYNGOSCOPY;  Surgeon: Jerrell Belfast, MD;  Location: [facility];  Service: ENT;  Laterality: N/A;  . TUBAL LIGATION    . VIDEO ASSISTED THORACOSCOPY (VATS)/ LOBECTOMY Left 07/04/2015   Procedure: VIDEO ASSISTED  THORACOSCOPY (VATS) LEFT UPPER LOBECTOMY;  Surgeon: Melrose Nakayama, MD;  Location: [facility];  Service: Thoracic;  Laterality: Left;  Marland Kitchen VIDEO BRONCHOSCOPY WITH INSERTION OF INTERBRONCHIAL VALVE (IBV) N/A 07/11/2015   Procedure: VIDEO BRONCHOSCOPY WITH INSERTION OF INTERBRONCHIAL VALVE (IBV);  Surgeon: Melrose Nakayama, MD;  Location: [facility];  Service: Thoracic;  Laterality: N/A;  . VIDEO BRONCHOSCOPY WITH INSERTION OF INTERBRONCHIAL VALVE (IBV) N/A 09/10/2015   Procedure: VIDEO BRONCHOSCOPY WITH REMOVAL OF INTERBRONCHIAL VALVES;  Surgeon: Melrose Nakayama, MD;  Location: [facility];  Service: Thoracic;  Laterality: N/A;    REVIEW OF SYSTEMS:  A comprehensive review of systems was negative except for: Respiratory: positive for dyspnea on exertion Musculoskeletal: positive for myalgias   PHYSICAL EXAMINATION: General appearance: alert, cooperative, fatigued and no distress Head: Normocephalic, without obvious abnormality, atraumatic Neck: no adenopathy, no JVD, supple, symmetrical, trachea midline and thyroid not enlarged, symmetric, no tenderness/mass/nodules Lymph nodes: Cervical, supraclavicular, and axillary nodes normal. Resp: clear to auscultation bilaterally Back: symmetric, no curvature. ROM normal. No CVA tenderness. Cardio: regular rate and rhythm, S1, S2 normal, no murmur, click, rub or gallop GI: soft, non-tender; bowel sounds normal; no masses,  no organomegaly Extremities: extremities normal, atraumatic, no cyanosis or edema  ECOG PERFORMANCE STATUS: 1 - Symptomatic but completely ambulatory  Blood pressure (!) 153/73, pulse 70, temperature 98 F (36.7 C), temperature source Oral, resp. rate 19, height 5\' 3"  (1.6 m), weight 160 lb 9.6 oz (72.8 kg), SpO2 96 %.  LABORATORY DATA: Lab Results  Component Value Date   WBC 5.9 06/20/2017   HGB 14.1 06/20/2017   HCT 42.8 06/20/2017   MCV 95.7 06/20/2017   PLT 226 06/20/2017      Chemistry      Component Value Date/Time   NA  142 06/20/2017 1502   K 4.0 06/20/2017 1502   CL 107 07/16/2016 1610   CO2 29 06/20/2017 1502   BUN 16.0 06/20/2017 1502   CREATININE 1.2 (H) 06/20/2017 1502      Component Value Date/Time   CALCIUM 9.8 06/20/2017 1502   ALKPHOS 74 06/20/2017 1502   AST 35 (H) 06/20/2017 1502   ALT 30 06/20/2017 1502   BILITOT 0.94 06/20/2017 1502       RADIOGRAPHIC STUDIES: Ct Chest W Contrast  Result Date: 06/20/2017 CLINICAL DATA:  76 year old female with history of left-sided lung cancer diagnosed in 2016. Shortness of breath and cough. EXAM: CT CHEST WITH CONTRAST TECHNIQUE: Multidetector CT imaging of the chest was performed during intravenous contrast administration. CONTRAST:  55mL ISOVUE-300 IOPAMIDOL (ISOVUE-300) INJECTION 61% COMPARISON:  Chest CT 12/14/2016.  Chest x-ray 07/16/2016. FINDINGS: Cardiovascular: Heart size is normal. There is no significant pericardial fluid, thickening or pericardial calcification. There is aortic atherosclerosis, as well as atherosclerosis  of the great vessels of the mediastinum and the coronary arteries, including calcified atherosclerotic plaque in the right coronary artery. Mediastinum/Nodes: Borderline enlarged right hilar lymph node measuring 9 mm in short axis, similar to multiple prior examinations, presumably benign. No other mediastinal or left hilar lymphadenopathy noted. Esophagus is unremarkable in appearance. No axillary lymphadenopathy. Lungs/Pleura: Status post left upper lobectomy. Compensatory hyperexpansion of the left lower lobe. Persistent nodular scarring and architectural distortion in the periphery of the left lower lobe which measures approximately 19 x 8 mm (axial image 103 of series 5), stable compared to prior examinations. No other new suspicious appearing pulmonary nodules or masses are noted. No acute consolidative airspace disease. No pleural effusions. Mild diffuse bronchial wall thickening with moderate centrilobular and mild paraseptal  emphysema. Upper Abdomen: Aortic atherosclerosis. Musculoskeletal: There are no aggressive appearing lytic or blastic lesions noted in the visualized portions of the skeleton. IMPRESSION: 1. Stable appearance of the chest following prior left upper lobectomy. No evidence to suggest local recurrence of disease or metastatic disease in the thorax. 2. Stable scarring in the periphery of the left lower lobe is unchanged over several prior examinations, considered benign. 3. Aortic atherosclerosis, in addition to right coronary artery disease. Assessment for potential risk factor modification, dietary therapy or pharmacologic therapy may be warranted, if clinically indicated. 4. Diffuse bronchial wall thickening with moderate centrilobular and mild paraseptal emphysema; imaging findings suggestive of underlying COPD. 5. Additional incidental findings, as above. Aortic Atherosclerosis (ICD10-I70.0) and Emphysema (ICD10-J43.9). Electronically Signed   By: Vinnie Langton M.D.   On: 06/20/2017 16:48    ASSESSMENT AND PLAN:  This is a very pleasant 76 years old African-American female with a stage IB non-small cell lung cancer status post right upper lobectomy with lymph node dissection in September 2016. The patient is feeling fine today with no specific complaints except for muscle cramps and shortness breath with exertion. Repeat CT scan of the chest showed no evidence for disease progression or recurrence. I discussed the scan results with the patient today and recommended for her to continue on observation with repeat CT scan of the chest in 6 months. The patient was advised to call immediately if she has any concerning symptoms in the interval. The patient voices understanding of current disease status and treatment options and is in agreement with the current care plan. All questions were answered. The patient knows to call the clinic with any problems, questions or concerns. We can certainly see the  patient much sooner if necessary. I spent 10 minutes counseling the patient face to face. The total time spent in the appointment was 15 minutes.  Disclaimer: This note was dictated with voice recognition software. Similar sounding words can inadvertently be transcribed and may not be corrected upon review.

## 2017-06-24 DIAGNOSIS — F0390 Unspecified dementia without behavioral disturbance: Secondary | ICD-10-CM | POA: Diagnosis not present

## 2017-06-24 DIAGNOSIS — Z9181 History of falling: Secondary | ICD-10-CM | POA: Diagnosis not present

## 2017-06-24 DIAGNOSIS — G8929 Other chronic pain: Secondary | ICD-10-CM | POA: Diagnosis not present

## 2017-06-24 DIAGNOSIS — E785 Hyperlipidemia, unspecified: Secondary | ICD-10-CM | POA: Diagnosis not present

## 2017-06-24 DIAGNOSIS — J449 Chronic obstructive pulmonary disease, unspecified: Secondary | ICD-10-CM | POA: Diagnosis not present

## 2017-06-24 DIAGNOSIS — M6281 Muscle weakness (generalized): Secondary | ICD-10-CM | POA: Diagnosis not present

## 2017-06-24 DIAGNOSIS — M545 Low back pain: Secondary | ICD-10-CM | POA: Diagnosis not present

## 2017-06-24 DIAGNOSIS — Z7982 Long term (current) use of aspirin: Secondary | ICD-10-CM | POA: Diagnosis not present

## 2017-06-29 DIAGNOSIS — Z7982 Long term (current) use of aspirin: Secondary | ICD-10-CM | POA: Diagnosis not present

## 2017-06-29 DIAGNOSIS — E785 Hyperlipidemia, unspecified: Secondary | ICD-10-CM | POA: Diagnosis not present

## 2017-06-29 DIAGNOSIS — F0390 Unspecified dementia without behavioral disturbance: Secondary | ICD-10-CM | POA: Diagnosis not present

## 2017-06-29 DIAGNOSIS — M6281 Muscle weakness (generalized): Secondary | ICD-10-CM | POA: Diagnosis not present

## 2017-06-29 DIAGNOSIS — J449 Chronic obstructive pulmonary disease, unspecified: Secondary | ICD-10-CM | POA: Diagnosis not present

## 2017-06-29 DIAGNOSIS — G8929 Other chronic pain: Secondary | ICD-10-CM | POA: Diagnosis not present

## 2017-06-29 DIAGNOSIS — M545 Low back pain: Secondary | ICD-10-CM | POA: Diagnosis not present

## 2017-06-29 DIAGNOSIS — Z9181 History of falling: Secondary | ICD-10-CM | POA: Diagnosis not present

## 2017-07-01 DIAGNOSIS — M6281 Muscle weakness (generalized): Secondary | ICD-10-CM | POA: Diagnosis not present

## 2017-07-01 DIAGNOSIS — E785 Hyperlipidemia, unspecified: Secondary | ICD-10-CM | POA: Diagnosis not present

## 2017-07-01 DIAGNOSIS — Z7982 Long term (current) use of aspirin: Secondary | ICD-10-CM | POA: Diagnosis not present

## 2017-07-01 DIAGNOSIS — M545 Low back pain: Secondary | ICD-10-CM | POA: Diagnosis not present

## 2017-07-01 DIAGNOSIS — J449 Chronic obstructive pulmonary disease, unspecified: Secondary | ICD-10-CM | POA: Diagnosis not present

## 2017-07-01 DIAGNOSIS — G8929 Other chronic pain: Secondary | ICD-10-CM | POA: Diagnosis not present

## 2017-07-01 DIAGNOSIS — F0390 Unspecified dementia without behavioral disturbance: Secondary | ICD-10-CM | POA: Diagnosis not present

## 2017-07-01 DIAGNOSIS — Z1231 Encounter for screening mammogram for malignant neoplasm of breast: Secondary | ICD-10-CM | POA: Diagnosis not present

## 2017-07-01 DIAGNOSIS — Z9181 History of falling: Secondary | ICD-10-CM | POA: Diagnosis not present

## 2017-07-05 DIAGNOSIS — M545 Low back pain: Secondary | ICD-10-CM | POA: Diagnosis not present

## 2017-07-05 DIAGNOSIS — Z7982 Long term (current) use of aspirin: Secondary | ICD-10-CM | POA: Diagnosis not present

## 2017-07-05 DIAGNOSIS — G8929 Other chronic pain: Secondary | ICD-10-CM | POA: Diagnosis not present

## 2017-07-05 DIAGNOSIS — J449 Chronic obstructive pulmonary disease, unspecified: Secondary | ICD-10-CM | POA: Diagnosis not present

## 2017-07-05 DIAGNOSIS — E785 Hyperlipidemia, unspecified: Secondary | ICD-10-CM | POA: Diagnosis not present

## 2017-07-05 DIAGNOSIS — M6281 Muscle weakness (generalized): Secondary | ICD-10-CM | POA: Diagnosis not present

## 2017-07-05 DIAGNOSIS — Z9181 History of falling: Secondary | ICD-10-CM | POA: Diagnosis not present

## 2017-07-05 DIAGNOSIS — F0390 Unspecified dementia without behavioral disturbance: Secondary | ICD-10-CM | POA: Diagnosis not present

## 2017-07-07 DIAGNOSIS — M6281 Muscle weakness (generalized): Secondary | ICD-10-CM | POA: Diagnosis not present

## 2017-07-07 DIAGNOSIS — Z9181 History of falling: Secondary | ICD-10-CM | POA: Diagnosis not present

## 2017-07-07 DIAGNOSIS — J449 Chronic obstructive pulmonary disease, unspecified: Secondary | ICD-10-CM | POA: Diagnosis not present

## 2017-07-07 DIAGNOSIS — Z7982 Long term (current) use of aspirin: Secondary | ICD-10-CM | POA: Diagnosis not present

## 2017-07-07 DIAGNOSIS — E785 Hyperlipidemia, unspecified: Secondary | ICD-10-CM | POA: Diagnosis not present

## 2017-07-07 DIAGNOSIS — G8929 Other chronic pain: Secondary | ICD-10-CM | POA: Diagnosis not present

## 2017-07-07 DIAGNOSIS — F0390 Unspecified dementia without behavioral disturbance: Secondary | ICD-10-CM | POA: Diagnosis not present

## 2017-07-07 DIAGNOSIS — M545 Low back pain: Secondary | ICD-10-CM | POA: Diagnosis not present

## 2017-08-21 ENCOUNTER — Emergency Department (HOSPITAL_COMMUNITY)
Admission: EM | Admit: 2017-08-21 | Discharge: 2017-08-21 | Disposition: A | Discharge status: Home / Self Care | Payer: Medicare HMO | Attending: Emergency Medicine | Admitting: Emergency Medicine

## 2017-08-21 ENCOUNTER — Encounter (HOSPITAL_COMMUNITY): Payer: Self-pay

## 2017-08-21 DIAGNOSIS — J449 Chronic obstructive pulmonary disease, unspecified: Secondary | ICD-10-CM | POA: Insufficient documentation

## 2017-08-21 DIAGNOSIS — R9431 Abnormal electrocardiogram [ECG] [EKG]: Secondary | ICD-10-CM | POA: Diagnosis not present

## 2017-08-21 DIAGNOSIS — R110 Nausea: Secondary | ICD-10-CM | POA: Diagnosis not present

## 2017-08-21 DIAGNOSIS — J45909 Unspecified asthma, uncomplicated: Secondary | ICD-10-CM | POA: Diagnosis not present

## 2017-08-21 DIAGNOSIS — Z79899 Other long term (current) drug therapy: Secondary | ICD-10-CM | POA: Diagnosis not present

## 2017-08-21 DIAGNOSIS — I10 Essential (primary) hypertension: Secondary | ICD-10-CM | POA: Diagnosis not present

## 2017-08-21 DIAGNOSIS — R42 Dizziness and giddiness: Secondary | ICD-10-CM | POA: Insufficient documentation

## 2017-08-21 DIAGNOSIS — H65192 Other acute nonsuppurative otitis media, left ear: Secondary | ICD-10-CM | POA: Diagnosis not present

## 2017-08-21 DIAGNOSIS — Z87891 Personal history of nicotine dependence: Secondary | ICD-10-CM | POA: Diagnosis not present

## 2017-08-21 DIAGNOSIS — Z7982 Long term (current) use of aspirin: Secondary | ICD-10-CM | POA: Insufficient documentation

## 2017-08-21 DIAGNOSIS — R404 Transient alteration of awareness: Secondary | ICD-10-CM | POA: Diagnosis not present

## 2017-08-21 DIAGNOSIS — R531 Weakness: Secondary | ICD-10-CM | POA: Insufficient documentation

## 2017-08-21 LAB — BASIC METABOLIC PANEL
Anion gap: 9 (ref 5–15)
BUN: 12 mg/dL (ref 6–20)
CO2: 29 mmol/L (ref 22–32)
Calcium: 9.2 mg/dL (ref 8.9–10.3)
Chloride: 103 mmol/L (ref 101–111)
Creatinine, Ser: 0.95 mg/dL (ref 0.44–1.00)
GFR calc Af Amer: >60 mL/min (ref >60)
GFR calc non Af Amer: 57 mL/min — ABNORMAL LOW (ref >60)
Glucose, Bld: 126 mg/dL — ABNORMAL HIGH (ref 65–99)
Potassium: 3.2 mmol/L — ABNORMAL LOW (ref 3.5–5.1)
Sodium: 141 mmol/L (ref 135–145)

## 2017-08-21 LAB — CBC
HEMATOCRIT: 43.8 % (ref 36.0–46.0)
HEMOGLOBIN: 14.7 g/dL (ref 12.0–15.0)
MCH: 32 pg (ref 26.0–34.0)
MCHC: 33.6 g/dL (ref 30.0–36.0)
MCV: 95.2 fL (ref 78.0–100.0)
Platelets: 227 10*3/uL (ref 150–400)
RBC: 4.6 MIL/uL (ref 3.87–5.11)
RDW: 15.2 % (ref 11.5–15.5)
WBC: 8.6 10*3/uL (ref 4.0–10.5)

## 2017-08-21 LAB — URINALYSIS, ROUTINE W REFLEX MICROSCOPIC
Bilirubin Urine: NEGATIVE
GLUCOSE, UA: NEGATIVE mg/dL
HGB URINE DIPSTICK: NEGATIVE
Ketones, ur: NEGATIVE mg/dL
LEUKOCYTES UA: NEGATIVE
Nitrite: NEGATIVE
PH: 7 (ref 5.0–8.0)
PROTEIN: NEGATIVE mg/dL
SPECIFIC GRAVITY, URINE: 1.006 (ref 1.005–1.030)

## 2017-08-21 LAB — I-STAT TROPONIN, ED: Troponin i, poc: 0.02 ng/mL (ref 0.00–0.08)

## 2017-08-21 LAB — CBG MONITORING, ED: Glucose-Capillary: 108 mg/dL — ABNORMAL HIGH (ref 65–99)

## 2017-08-21 MED ORDER — MECLIZINE HCL 25 MG PO TABS
25.0000 mg | ORAL_TABLET | Freq: Once | ORAL | Status: AC
  Administered 2017-08-21: 25 mg via ORAL
  Filled 2017-08-21: qty 1

## 2017-08-21 MED ORDER — POTASSIUM CHLORIDE CRYS ER 20 MEQ PO TBCR
40.0000 meq | EXTENDED_RELEASE_TABLET | Freq: Once | ORAL | Status: AC
  Administered 2017-08-21: 40 meq via ORAL
  Filled 2017-08-21: qty 2

## 2017-08-21 MED ORDER — MECLIZINE HCL 25 MG PO TABS: 25.0000 mg | ORAL_TABLET | Freq: Three times a day (TID) | ORAL | 0 refills | Status: DC | PRN

## 2017-08-21 MED ORDER — ONDANSETRON 4 MG PO TBDP: 4.0000 mg | ORAL_TABLET | Freq: Three times a day (TID) | ORAL | 0 refills | Status: DC | PRN

## 2017-08-21 MED ORDER — ONDANSETRON HCL 4 MG/2ML IJ SOLN
4.0000 mg | Freq: Once | INTRAMUSCULAR | Status: AC
  Administered 2017-08-21: 4 mg via INTRAVENOUS
  Filled 2017-08-21: qty 2

## 2017-08-21 NOTE — ED Notes (Signed)
Bed: WA08 Expected date:  Expected time:  Means of arrival:  Comments: 76 yo dizziness

## 2017-08-21 NOTE — ED Provider Notes (Signed)
[name]   CSN: [id number] Arrival date & time: 08/21/17  1315     History   Chief Complaint Chief Complaint  Patient presents with  . Dizziness  . Weakness    HPI [name] is a 76 y.o. female.  HPI   76yo female with history of COPD, htn, hlpd presents with concern for dizziness. Reports when she woke up this AM she had sudden onset severe dizziness. Felt like the room was spinning.  Dizziness was so severe she crawled to the bathroom  Had severe nausea, dry heaving, no vomiting.  Reports she felt like everything was about to come back up.  Reports dizziness was severe room spinning. It improved with laying still and closing eyes. Worsened when standing up. Will resolve when laying down then recur when sitting up or moving.  No numbness, weakness, word finding difficulty, slurred speech, visual changes, coordination problems.    Past Medical History:  Diagnosis Date  . Arthritis   . Asthma   . COPD (chronic obstructive pulmonary disease) ([facility])   . Depression   . GERD (gastroesophageal reflux disease)   . Hyperlipidemia   . Hypertension 06/15/2016  . Shortness of breath   . Thyroid nodule     Patient Active Problem List   Diagnosis Date Noted  . Hypertension 06/15/2016  . Adenocarcinoma of left lung ([facility]) 07/04/2015  . 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 Multinodular goiter (nontoxic) 07/18/2014  . Malnutrition of moderate degree ([facility]) 05/24/2014  . Moderate malnutrition ([facility]) 05/24/2014  . Other and unspecified hyperlipidemia 05/23/2014  . Rhinitis, allergic 11/22/2013  . COPD (chronic obstructive pulmonary disease) ([facility]) 12/15/2012    Past Surgical History:  Procedure Laterality Date  . BIOPSY THYROID    . CATARACT EXTRACTION     left eye  . CATARACT EXTRACTION W/ INTRAOCULAR LENS IMPLANT     RIGHT  . CESAREAN SECTION    . EYE SURGERY    . MASS EXCISION N/A 12/13/2014   Procedure:  EXCISION OF VOCAL CORD POLYPS;  Surgeon: Jerrell Belfast, MD;  Location: [facility];  Service: ENT;  Laterality: N/A;  . MICROLARYNGOSCOPY N/A 12/13/2014   Procedure: MICROLARYNGOSCOPY;  Surgeon: Jerrell Belfast, MD;  Location: [facility];  Service: ENT;  Laterality: N/A;  . TUBAL LIGATION    . VIDEO ASSISTED THORACOSCOPY (VATS)/ LOBECTOMY Left 07/04/2015   Procedure: VIDEO ASSISTED THORACOSCOPY (VATS) LEFT UPPER LOBECTOMY;  Surgeon: Melrose Nakayama, MD;  Location: [facility];  Service: Thoracic;  Laterality: Left;  Marland Kitchen VIDEO BRONCHOSCOPY WITH INSERTION OF INTERBRONCHIAL VALVE (IBV) N/A 07/11/2015   Procedure: VIDEO BRONCHOSCOPY WITH INSERTION OF INTERBRONCHIAL VALVE (IBV);  Surgeon: Melrose Nakayama, MD;  Location: [facility];  Service: Thoracic;  Laterality: N/A;  . VIDEO BRONCHOSCOPY WITH INSERTION OF INTERBRONCHIAL VALVE (IBV) N/A 09/10/2015   Procedure: VIDEO BRONCHOSCOPY WITH REMOVAL OF INTERBRONCHIAL VALVES;  Surgeon: Melrose Nakayama, MD;  Location: [facility];  Service: Thoracic;  Laterality: N/A;    OB History    No data available       Home Medications    Prior to Admission medications   Medication Sig Start Date End Date Taking? Authorizing Provider  albuterol (PROVENTIL HFA;VENTOLIN HFA) 108 (90 Base) MCG/ACT inhaler Inhale 2 puffs into the lungs every 6 (six) hours as needed for wheezing or shortness of breath. 06/18/16  Yes Rigoberto Noel, MD  aspirin 81 MG tablet Take 81 mg by mouth every evening.  Yes [provider]  cyclobenzaprine (FLEXERIL) 5 MG tablet Take 1 tablet (5 mg total) by mouth 3 (three) times daily as needed for muscle spasms. 07/16/16  Yes Drenda Freeze, MD  donepezil (ARICEPT) 10 MG tablet Take 10 mg by mouth every evening. 03/27/16  Yes [provider]  ezetimibe (ZETIA) 10 MG tablet Take 10 mg by mouth at bedtime.    Yes [provider]  Fluticasone-Umeclidin-Vilant (TRELEGY ELLIPTA) 100-62.5-25 MCG/INH AEPB Inhale 1 puff into the lungs  daily. 04/14/17  Yes Rigoberto Noel, MD  Magnesium Oxide (MAG-200) 200 MG TABS Take 1 tablet by mouth daily.   Yes [provider]  Melatonin 2.5 MG CAPS Take 1 capsule by mouth at bedtime as needed.   Yes [provider]  mirtazapine (REMERON) 15 MG tablet Take 7.5 mg by mouth at bedtime.  08/08/14  Yes [provider]  Multiple Vitamin (MULTIVITAMIN) tablet Take 1 tablet by mouth daily.   Yes [provider]  niacin (NIASPAN) 500 MG CR tablet Take 500 mg by mouth every evening.    Yes [provider]  OVER THE COUNTER MEDICATION Take 1 tablet by mouth daily.   Yes [provider]  pantoprazole (PROTONIX) 40 MG tablet Take 40 mg by mouth 2 (two) times daily.   Yes [provider]  RESTASIS 0.05 % ophthalmic emulsion Place 1 drop into both eyes 2 (two) times daily.  03/10/16  Yes [provider]  valACYclovir (VALTREX) 500 MG tablet Take 500 mg by mouth daily.   Yes [provider]  VOLTAREN 1 % GEL Apply 1 application topically 4 (four) times daily as needed. 06/09/17  Yes [provider]  White Petrolatum-Mineral Oil (SYSTANE NIGHTTIME OP) Place 1 drop into both eyes at bedtime as needed. For dry eyes   Yes [provider]  co-enzyme Q-10 30 MG capsule Take 30 mg by mouth daily.    [provider]  meclizine (ANTIVERT) 25 MG tablet Take 1 tablet (25 mg total) by mouth 3 (three) times daily as needed for dizziness. 08/21/17   Gareth Morgan, MD  ondansetron (ZOFRAN ODT) 4 MG disintegrating tablet Take 1 tablet (4 mg total) by mouth every 8 (eight) hours as needed for nausea or vomiting. 08/21/17   Gareth Morgan, MD  potassium chloride SA (K-DUR,KLOR-CON) 20 MEQ tablet Take 1 tablet (20 mEq total) by mouth 2 (two) times daily. Patient not taking: Reported on 08/21/2017 06/11/16   Curt Bears, MD    Family History Family History  Problem Relation Age of Onset  . Renal cancer Brother     . Diabetes Mother   . Hypertension Mother   . Renal Disease Mother     Social History Social History   Tobacco Use  . Smoking status: Former Smoker    Packs/day: 1.00    Years: 56.00    Pack years: 56.00    Types: Cigarettes    Last attempt to quit: 06/22/2015    Years since quitting: 2.1  . Smokeless tobacco: Never Used  Substance Use Topics  . Alcohol use: No    Alcohol/week: 0.0 oz  . Drug use: No     Allergies   Patient has no known allergies.   Review of Systems Review of Systems  Constitutional: Negative for fever.  HENT: Positive for congestion (chronic). Negative for sore throat.   Eyes: Negative for visual disturbance.  Respiratory: Positive for cough (chronic). Negative for shortness of breath.   Cardiovascular:  Negative for chest pain.  Gastrointestinal: Positive for nausea. Negative for abdominal pain, blood in stool, constipation, diarrhea and vomiting.  Genitourinary: Negative for difficulty urinating.  Musculoskeletal: Negative for back pain and neck pain.  Skin: Negative for rash.  Neurological: Positive for dizziness. Negative for syncope, facial asymmetry, speech difficulty, weakness, numbness and headaches.     Physical Exam Updated Vital Signs BP (!) 130/103 (BP Location: Left Arm)   Pulse 77   Temp 98.4 F (36.9 C) (Oral)   Resp 18   SpO2 95%   Physical Exam  Constitutional: She is oriented to person, place, and time. She appears well-developed and well-nourished. No distress.  HENT:  Head: Normocephalic and atraumatic.  Left Ear: A middle ear effusion is present.  Eyes: Conjunctivae and EOM are normal.  No skew  Neck: Normal range of motion.  Cardiovascular: Normal rate, regular rhythm, normal heart sounds and intact distal pulses. Exam reveals no gallop and no friction rub.  No murmur heard. Pulmonary/Chest: Effort normal and breath sounds normal. No respiratory distress. She has no wheezes. She has no rales.  Abdominal: Soft.  She exhibits no distension. There is no tenderness. There is no guarding.  Musculoskeletal: She exhibits no edema or tenderness.  Neurological: She is alert and oriented to person, place, and time. She has normal strength. No cranial nerve deficit or sensory deficit. She displays a negative Romberg sign. Coordination and gait normal. GCS eye subscore is 4. GCS verbal subscore is 5. GCS motor subscore is 6.  Skin: Skin is warm and dry. No rash noted. She is not diaphoretic. No erythema.  Nursing [name] and vitals reviewed.    ED Treatments / Results  Labs (all labs ordered are listed, but only abnormal results are displayed) Labs Reviewed  BASIC METABOLIC PANEL - Abnormal; Notable for the following components:      Result Value   Potassium 3.2 (*)    Glucose, Bld 126 (*)    GFR calc non Af Amer 57 (*)    All other components within normal limits  CBG MONITORING, ED - Abnormal; Notable for the following components:   Glucose-Capillary 108 (*)    All other components within normal limits  CBC  URINALYSIS, ROUTINE W REFLEX MICROSCOPIC  I-STAT TROPONIN, ED    EKG  EKG Interpretation  Date/Time:  Sunday August 21 2017 13:35:47 EST Ventricular Rate:  62 PR Interval:    QRS Duration: 107 QT Interval:  439 QTC Calculation: 446 R Axis:   78 Text Interpretation:  Sinus arrhythmia Nonspecific T abnrm, anterolateral leads Inversion aVL new from prior, nonspecific changes anterior in leads Confirmed by ,  ([id number]) on 08/21/2017 1:47:45 PM       Radiology No results found.  Procedures Procedures (including critical care time)  Medications Ordered in ED Medications  ondansetron (ZOFRAN) injection 4 mg (4 mg Intravenous Given 08/21/17 1458)  meclizine (ANTIVERT) tablet 25 mg (25 mg Oral Given 08/21/17 1458)  potassium chloride SA (K-DUR,KLOR-CON) CR tablet 40 mEq (40 mEq Oral Given 08/21/17 1458)     Initial Impression / Assessment and Plan / ED Course  I have  reviewed the triage vital signs and the nursing notes.  Pertinent labs & imaging results that were available during my care of the patient were reviewed by me and considered in my medical decision making (see chart for details).     76 yo female with history of COPD, htn, hlpd presents with concern for dizziness. Differential diagnosis for dizziness includes central  causes such as stroke, intracranial bleed, mass and peripheral causes such as BPPV, meniere's disease, viral.  Vertigo is very positional, patient has normal neurologic exam, including coordination, normal gait and negative romberg, and have low suspicion for CVA or other central cause of vertigo.  She feels improved after meclizine.  Suspect peripheral etiology of vertigo. Given rx for meclizine and zofran.  No signs of acute infection, acute bleeding, cardiac abnormality.  Patient discharged in stable condition with understanding of reasons to return.   Final Clinical Impressions(s) / ED Diagnoses   Final diagnoses:  Dizziness  Vertigo  Acute middle ear effusion, left    ED Discharge Orders        Ordered    meclizine (ANTIVERT) 25 MG tablet  3 times daily PRN     08/21/17 1543    ondansetron (ZOFRAN ODT) 4 MG disintegrating tablet  Every 8 hours PRN     08/21/17 1543       Gareth Morgan, MD 08/21/17 2040

## 2017-08-21 NOTE — ED Notes (Signed)
Pt ambulatory and independent at discharge.  Verbalized understanding of discharge instructions 

## 2017-08-21 NOTE — ED Triage Notes (Signed)
Per EMS, pt from home.  Pt c/o dizziness.  Woke up today and noticed symptom.  Felt fine prior.  Nausea with movement.  No pain.  No stroke symptoms noted.  Vitals: 186/90, hr 64, resp 16, 96% ra, cbg 153.  No orthostatic changes with repeat vitals

## 2017-10-05 ENCOUNTER — Ambulatory Visit: Payer: Medicare HMO | Admitting: Adult Health

## 2017-10-09 ENCOUNTER — Other Ambulatory Visit: Payer: Self-pay | Admitting: Pulmonary Disease

## 2017-10-10 ENCOUNTER — Ambulatory Visit (INDEPENDENT_AMBULATORY_CARE_PROVIDER_SITE_OTHER): Payer: Medicare HMO | Admitting: Adult Health

## 2017-10-10 ENCOUNTER — Ambulatory Visit (INDEPENDENT_AMBULATORY_CARE_PROVIDER_SITE_OTHER)
Admission: RE | Admit: 2017-10-10 | Discharge: 2017-10-10 | Disposition: A | Discharge status: Home / Self Care | Payer: Medicare HMO | Source: Ambulatory Visit | Attending: Adult Health | Admitting: Adult Health

## 2017-10-10 ENCOUNTER — Encounter: Payer: Self-pay | Admitting: Adult Health

## 2017-10-10 VITALS — BP 122/74 | HR 73 | Ht 63.0 in | Wt 159.0 lb

## 2017-10-10 DIAGNOSIS — J449 Chronic obstructive pulmonary disease, unspecified: Secondary | ICD-10-CM

## 2017-10-10 DIAGNOSIS — I10 Essential (primary) hypertension: Secondary | ICD-10-CM

## 2017-10-10 DIAGNOSIS — R0602 Shortness of breath: Secondary | ICD-10-CM | POA: Diagnosis not present

## 2017-10-10 DIAGNOSIS — C3492 Malignant neoplasm of unspecified part of left bronchus or lung: Secondary | ICD-10-CM

## 2017-10-10 DIAGNOSIS — R05 Cough: Secondary | ICD-10-CM | POA: Diagnosis not present

## 2017-10-10 MED ORDER — LEVALBUTEROL HCL 0.63 MG/3ML IN NEBU
0.6300 mg | INHALATION_SOLUTION | Freq: Once | RESPIRATORY_TRACT | Status: AC
  Administered 2017-10-10: 0.63 mg via RESPIRATORY_TRACT

## 2017-10-10 MED ORDER — PREDNISONE 10 MG PO TABS
ORAL_TABLET | ORAL | 0 refills | Status: DC
Start: 2017-10-10 — End: 2017-11-21

## 2017-10-10 MED ORDER — AZITHROMYCIN 250 MG PO TABS: ORAL_TABLET | ORAL | 0 refills | Status: AC

## 2017-10-10 NOTE — Patient Instructions (Addendum)
Zpack as directed.  Prednisone taper over next week.  Mucinex DM Twice daily  .As needed  Cough/congestion  Chest xray today .  Refer to cardiology .  Continue on TRELEGY daily . Rinse after use.  follow up with Dr. Elsworth Soho  In 6- 8 weeks  and As needed   Please contact office for sooner follow up if symptoms do not improve or worsen or seek emergency care

## 2017-10-10 NOTE — Assessment & Plan Note (Signed)
Exacerbation. Xopenex nebulizer given in the office  Plan  Patient Instructions  Zpack as directed.  Prednisone taper over next week.  Mucinex DM Twice daily  .As needed  Cough/congestion  Chest xray today .  Refer to cardiology .  Continue on TRELEGY daily . Rinse after use.  follow up with Dr. Elsworth Soho  In 6- 8 weeks  and As needed   Please contact office for sooner follow up if symptoms do not improve or worsen or seek emergency care

## 2017-10-10 NOTE — Progress Notes (Signed)
@Patient  ID: Gae Gallop, female    DOB: [birth year], 77 y.o.   MRN: [id number]  No chief complaint on file.   Referring provider: Glendale Chard, MD  HPI: 77 year old  Former smoker (2016)  for FU of gold B COPD. /Lung cancer  She started smoking as a teenager and smoked about 60 pack years. She worked in a [employer] for 20 years until she retired in 51.   In 06/2015 She underwent left VATS with left upper lobectomy and mediastinal lymph node dissection. Path showed invasive adenocarcinoma, adenocarcinoma involving the visceral pleura in addition to lymphovascular invasion. The resection margins as well as the dissected lymph nodes were negative for malignancy. She had persistent air leak postop and required endobronchial valves for resolution.  Significant tests/ events  Pre-op PFT - FEV1 73 %, DLCO 48%  03/2016 FEV1 66%  10/10/2017 Follow up : COPD , Lung Cancer , Dyspnea  Patient presents for six-month follow-up.  She says over the last few weeks that she her breathing has not been doing as well.  She has had increased congestion with thick mucus and intermittent wheezing.  She does get more short of breath with activity.  Does worry over the last few months her breathing has not been as good as well.  She says she gets intermittent chest tightness.  She denies any chest pain radiating pains or diaphoresis.  Patient remains on TRELEGY daily.  Spirometry today shows an FEV1 at 63% ratio 57 and FVC of 86%.  This is similar to 2017   Patient has a history of lung cancer diagnosed in 2016 status post left upper lobe lobectomy with lymph node dissection.  Serial CT has shown no evidence of disease recurrence.  Last CT chest was in October 2018.  Patient does request a cardiology referral.  She went to the emergency room in December for shortness of breath and dizziness.  Troponin and EKG were unremarkable.  Recent CAT scan does show atherosclerosis buildup.  And her coronary  arteries.  Says she is unsure about a family history of coronary disease.Marland Kitchen  No Known Allergies  Immunization History  Administered Date(s) Administered  . Influenza, High Dose Seasonal PF 06/06/2016  . Influenza-Unspecified 05/01/2017  . Pneumococcal Conjugate-13 07/17/2014  . Pneumococcal Polysaccharide-23 09/07/2011    Past Medical History:  Diagnosis Date  . Arthritis   . Asthma   . COPD (chronic obstructive pulmonary disease) ([place])   . Depression   . GERD (gastroesophageal reflux disease)   . Hyperlipidemia   . Hypertension 06/15/2016  . Shortness of breath   . Thyroid nodule     Tobacco History: Social History   Tobacco Use  Smoking Status Former Smoker  . Packs/day: 1.00  . Years: 56.00  . Pack years: 56.00  . Types: Cigarettes  . Last attempt to quit: 06/22/2015  . Years since quitting: 2.3  Smokeless Tobacco Never Used   Counseling given: Not Answered   Outpatient Encounter Medications as of 10/10/2017  Medication Sig  . albuterol (PROVENTIL HFA;VENTOLIN HFA) 108 (90 Base) MCG/ACT inhaler Inhale 2 puffs into the lungs every 6 (six) hours as needed for wheezing or shortness of breath.  Marland Kitchen aspirin 81 MG tablet Take 81 mg by mouth every evening.   Marland Kitchen co-enzyme Q-10 30 MG capsule Take 30 mg by mouth daily.  . cyclobenzaprine (FLEXERIL) 5 MG tablet Take 1 tablet (5 mg total) by mouth 3 (three) times daily as needed for muscle spasms.  Marland Kitchen donepezil (  ARICEPT) 10 MG tablet Take 10 mg by mouth every evening.  . ezetimibe (ZETIA) 10 MG tablet Take 10 mg by mouth at bedtime.   . Magnesium Oxide (MAG-200) 200 MG TABS Take 1 tablet by mouth daily.  . Melatonin 2.5 MG CAPS Take 1 capsule by mouth at bedtime as needed.  . mirtazapine (REMERON) 15 MG tablet Take 7.5 mg by mouth at bedtime.   . Multiple Vitamin (MULTIVITAMIN) tablet Take 1 tablet by mouth daily.  . niacin (NIASPAN) 500 MG CR tablet Take 500 mg by mouth every evening.   Marland Kitchen OVER THE COUNTER MEDICATION Take 1  tablet by mouth daily.  . pantoprazole (PROTONIX) 40 MG tablet Take 40 mg by mouth 2 (two) times daily.  . potassium chloride SA (K-DUR,KLOR-CON) 20 MEQ tablet Take 1 tablet (20 mEq total) by mouth 2 (two) times daily.  . RESTASIS 0.05 % ophthalmic emulsion Place 1 drop into both eyes 2 (two) times daily.   . TRELEGY ELLIPTA 100-62.5-25 MCG/INH AEPB TAKE 1 PUFF BY MOUTH EVERY DAY  . valACYclovir (VALTREX) 500 MG tablet Take 500 mg by mouth daily.  . VOLTAREN 1 % GEL Apply 1 application topically 4 (four) times daily as needed.  Dema Severin Petrolatum-Mineral Oil (SYSTANE NIGHTTIME OP) Place 1 drop into both eyes at bedtime as needed. For dry eyes  . azithromycin (ZITHROMAX Z-PAK) 250 MG tablet Take 2 tablets (500 mg) on  Day 1,  followed by 1 tablet (250 mg) once daily on Days 2 through 5.  . predniSONE (DELTASONE) 10 MG tablet 4 tabs for 2 days, then 3 tabs for 2 days, 2 tabs for 2 days, then 1 tab for 2 days, then stop  . [DISCONTINUED] meclizine (ANTIVERT) 25 MG tablet Take 1 tablet (25 mg total) by mouth 3 (three) times daily as needed for dizziness. (Patient not taking: Reported on 10/10/2017)  . [DISCONTINUED] ondansetron (ZOFRAN ODT) 4 MG disintegrating tablet Take 1 tablet (4 mg total) by mouth every 8 (eight) hours as needed for nausea or vomiting. (Patient not taking: Reported on 10/10/2017)  . [EXPIRED] levalbuterol (XOPENEX) nebulizer solution 0.63 mg    No facility-administered encounter medications on file as of 10/10/2017.      Review of Systems  Constitutional:   No  weight loss, night sweats,  Fevers, chills, + fatigue, or  lassitude.  HEENT:   No headaches,  Difficulty swallowing,  Tooth/dental problems, or  Sore throat,                No sneezing, itching, ear ache, + nasal congestion, post nasal drip,   CV:  No chest pain,  Orthopnea, PND, swelling in lower extremities, anasarca, dizziness, palpitations, syncope.   GI  No heartburn, indigestion, abdominal pain, nausea,  vomiting, diarrhea, change in bowel habits, loss of appetite, bloody stools.   Resp: No chest wall deformity  Skin: no rash or lesions.  GU: no dysuria, change in color of urine, no urgency or frequency.  No flank pain, no hematuria   MS:  No joint pain or swelling.  No decreased range of motion.  No back pain.    Physical Exam  BP 122/74 (BP Location: Right Arm, Cuff Size: Normal)   Pulse 73   Ht 5\' 3"  (1.6 m)   Wt 159 lb (72.1 kg)   SpO2 95%   BMI 28.17 kg/m   GEN: A/Ox3; pleasant , NAD , elderly    HEENT:  [place]/AT,  EACs-clear, TMs-wnl, NOSE-clear, THROAT-clear, no lesions,  no postnasal drip or exudate noted.   NECK:  Supple w/ fair ROM; no JVD; normal carotid impulses w/o bruits; no thyromegaly or nodules palpated; no lymphadenopathy.    RESP  Few trace rhonchi ,  no accessory muscle use, no dullness to percussion  CARD:  RRR, no m/r/g, no peripheral edema, pulses intact, no cyanosis or clubbing.  GI:   Soft & nt; nml bowel sounds; no organomegaly or masses detected.   Musco: Warm bil, no deformities or joint swelling noted.   Neuro: alert, no focal deficits noted.    Skin: Warm, no lesions or rashes    Lab Results:  CBC   BNP No results found for: BNP  ProBNP No results found for: PROBNP  Imaging: Dg Chest 2 View  Result Date: 10/10/2017 CLINICAL DATA:  Increase shortness of breath over the past month associated with wheezing, chest congestion, and cough. History of asthma-COPD. Former smoker. EXAM: CHEST  2 VIEW COMPARISON:  Chest x-ray of July 16, 2016 and chest CT scan of June 20, 2017 FINDINGS: The lungs are adequately inflated. The interstitial markings are coarse though stable. There is no alveolar infiltrate or pleural effusion. The heart and pulmonary vascularity are normal. There is a surgical suture line adjacent to the proximal descending thoracic aorta related to the previous partial left upper lobectomy. There surgical clips in the AP  window region. The bony thorax exhibits no acute abnormality. IMPRESSION: Chronic bronchitic-reactive airway changes, stable. There is no acute cardiopulmonary abnormality. Electronically Signed   By: David  Martinique M.D.   On: 10/10/2017 13:15     Assessment & Plan:   COPD (chronic obstructive pulmonary disease) Exacerbation. Xopenex nebulizer given in the office  Plan  Patient Instructions  Zpack as directed.  Prednisone taper over next week.  Mucinex DM Twice daily  .As needed  Cough/congestion  Chest xray today .  Refer to cardiology .  Continue on TRELEGY daily . Rinse after use.  follow up with Dr. Elsworth Soho  In 6- 8 weeks  and As needed   Please contact office for sooner follow up if symptoms do not improve or worsen or seek emergency care        Adenocarcinoma of left lung [facility]) Recent CT chest in October 2018 showed no evidence of disease recurrence  Dyspnea Dyspnea suspect is multifactorial with underlying COPD and current exacerbation.  Patient does request a cardiology referral and there is notable plaque buildup on recent CT chest.  Will refer to cardiology for further evaluation to see if any further cardiac evaluation is indicated.  Patient has no active symptoms of chest pain     Rexene Edison, NP 10/10/2017

## 2017-10-10 NOTE — Assessment & Plan Note (Signed)
Dyspnea suspect is multifactorial with underlying COPD and current exacerbation.  Patient does request a cardiology referral and there is notable plaque buildup on recent CT chest.  Will refer to cardiology for further evaluation to see if any further cardiac evaluation is indicated.  Patient has no active symptoms of chest pain

## 2017-10-10 NOTE — Assessment & Plan Note (Signed)
Recent CT chest in October 2018 showed no evidence of disease recurrence

## 2017-10-13 ENCOUNTER — Telehealth: Payer: Self-pay | Admitting: Pulmonary Disease

## 2017-10-13 NOTE — Telephone Encounter (Signed)
Called and spoke with patient, advised her of results. Nothing further needed.

## 2017-10-19 NOTE — Progress Notes (Signed)
Reviewed & agree with plan  

## 2017-11-03 ENCOUNTER — Other Ambulatory Visit: Payer: Self-pay | Admitting: Pulmonary Disease

## 2017-11-21 ENCOUNTER — Encounter: Payer: Self-pay | Admitting: Adult Health

## 2017-11-21 ENCOUNTER — Ambulatory Visit (INDEPENDENT_AMBULATORY_CARE_PROVIDER_SITE_OTHER): Payer: Medicare HMO | Admitting: Adult Health

## 2017-11-21 DIAGNOSIS — J449 Chronic obstructive pulmonary disease, unspecified: Secondary | ICD-10-CM | POA: Diagnosis not present

## 2017-11-21 DIAGNOSIS — J309 Allergic rhinitis, unspecified: Secondary | ICD-10-CM

## 2017-11-21 NOTE — Patient Instructions (Signed)
Continue on TRELEGY daily . Rinse after use.  Continue on Zyrtec 10mg  At bedtime  .  Saline nasal rinses As needed   Continue on Flonase daily  Follow up with Dr. Elsworth Soho  In 6 months and  and As needed   Please contact office for sooner follow up if symptoms do not improve or worsen or seek emergency care

## 2017-11-21 NOTE — Progress Notes (Signed)
@Patient  ID: [name], female    DOB: 1940/11/18, 77 y.o.   MRN: [id number]  Chief Complaint  Patient presents with  . Follow-up    COPD    Referring provider: Glendale Chard, MD  HPI: She started smoking as a teenager and smoked about 60 pack years. She worked in a [employer] for 20 years until she retired in 60.   In 06/2015 She underwent left VATS with left upper lobectomy and mediastinal lymph node dissection. Path showed invasive adenocarcinoma, adenocarcinoma involving the visceral pleura in addition to lymphovascular invasion. The resection margins as well as the dissected lymph nodes were negative for malignancy. She had persistent air leak postop and required endobronchial valves for resolution.  Significant tests/ events  Pre-op PFT - FEV1 73 %, DLCO 48%  03/2016 FEV1 66%  Spirometry today shows an FEV1 at 63% ratio 57 and FVC of 86%.    11/21/2017 Follow up : COPD , Lung cancer  Patient presents for a one-month follow-up.  She was seen last visit with a COPD exacerbation.  She was treated with a Z-Pak and prednisone taper.  Patient says she is feeling better with dcreased cough and congestion . breathigng is getting back to baseline .  She remains on TRELEGY .   Is having more nasal drainage last couple of weeks. Taking zyrtec and flonase. Helping some     No Known Allergies  Immunization History  Administered Date(s) Administered  . Influenza, High Dose Seasonal PF 06/06/2016  . Influenza-Unspecified 05/01/2017  . Pneumococcal Conjugate-13 07/17/2014  . Pneumococcal Polysaccharide-23 09/07/2011    Past Medical History:  Diagnosis Date  . Arthritis   . Asthma   . COPD (chronic obstructive pulmonary disease) ([place])   . Depression   . GERD (gastroesophageal reflux disease)   . Hyperlipidemia   . Hypertension 06/15/2016  . Shortness of breath   . Thyroid nodule     Tobacco History: Social History   Tobacco Use  Smoking Status Former  Smoker  . Packs/day: 1.00  . Years: 56.00  . Pack years: 56.00  . Types: Cigarettes  . Last attempt to quit: 06/22/2015  . Years since quitting: 2.4  Smokeless Tobacco Never Used   Counseling given: Not Answered   Outpatient Encounter Medications as of 11/21/2017  Medication Sig  . albuterol (PROVENTIL HFA;VENTOLIN HFA) 108 (90 Base) MCG/ACT inhaler Inhale 2 puffs into the lungs every 6 hours as needed for wheezing or shortness of breath.   Marland Kitchen aspirin 81 MG tablet Take 81 mg by mouth every evening.   Marland Kitchen co-enzyme Q-10 30 MG capsule Take 30 mg by mouth daily.  . cyclobenzaprine (FLEXERIL) 5 MG tablet Take 1 tablet (5 mg total) by mouth 3 (three) times daily as needed for muscle spasms.  Marland Kitchen donepezil (ARICEPT) 10 MG tablet Take 10 mg by mouth every evening.  . ezetimibe (ZETIA) 10 MG tablet Take 10 mg by mouth at bedtime.   . Magnesium Oxide (MAG-200) 200 MG TABS Take 1 tablet by mouth daily.  . Melatonin 2.5 MG CAPS Take 1 capsule by mouth at bedtime as needed.  . mirtazapine (REMERON) 15 MG tablet Take 7.5 mg by mouth at bedtime.   . Multiple Vitamin (MULTIVITAMIN) tablet Take 1 tablet by mouth daily.  . niacin (NIASPAN) 500 MG CR tablet Take 500 mg by mouth every evening.   Marland Kitchen OVER THE COUNTER MEDICATION Take 1 tablet by mouth daily.  . pantoprazole (PROTONIX) 40 MG tablet Take 40  mg by mouth 2 (two) times daily.  . potassium chloride SA (K-DUR,KLOR-CON) 20 MEQ tablet Take 1 tablet (20 mEq total) by mouth 2 (two) times daily.  . RESTASIS 0.05 % ophthalmic emulsion Place 1 drop into both eyes 2 (two) times daily.   . TRELEGY ELLIPTA 100-62.5-25 MCG/INH AEPB TAKE 1 PUFF BY MOUTH EVERY DAY  . valACYclovir (VALTREX) 500 MG tablet Take 500 mg by mouth daily.  . VOLTAREN 1 % GEL Apply 1 application topically 4 (four) times daily as needed.  Dema Severin Petrolatum-Mineral Oil (SYSTANE NIGHTTIME OP) Place 1 drop into both eyes at bedtime as needed. For dry eyes  . [DISCONTINUED] predniSONE  (DELTASONE) 10 MG tablet 4 tabs for 2 days, then 3 tabs for 2 days, 2 tabs for 2 days, then 1 tab for 2 days, then stop (Patient not taking: Reported on 11/21/2017)   No facility-administered encounter medications on file as of 11/21/2017.      Review of Systems  Constitutional:   No  weight loss, night sweats,  Fevers, chills, fatigue, or  lassitude.  HEENT:   No headaches,  Difficulty swallowing,  Tooth/dental problems, or  Sore throat,                No sneezing, itching, ear ache, nasal congestion, post nasal drip,   CV:  No chest pain,  Orthopnea, PND, swelling in lower extremities, anasarca, dizziness, palpitations, syncope.   GI  No heartburn, indigestion, abdominal pain, nausea, vomiting, diarrhea, change in bowel habits, loss of appetite, bloody stools.   Resp: No shortness of breath with exertion or at rest.  No excess mucus, no productive cough,  No non-productive cough,  No coughing up of blood.  No change in color of mucus.  No wheezing.  No chest wall deformity  Skin: no rash or lesions.  GU: no dysuria, change in color of urine, no urgency or frequency.  No flank pain, no hematuria   MS:  No joint pain or swelling.  No decreased range of motion.  No back pain.    Physical Exam  BP 124/76 (BP Location: Left Arm, Cuff Size: Normal)   Pulse 89   Ht 5\' 3"  (1.6 m)   Wt 157 lb 6.4 oz (71.4 kg)   SpO2 95%   BMI 27.88 kg/m   GEN: A/Ox3; pleasant , NAD, elderly    HEENT:  [place]/AT,  EACs-clear, TMs-wnl, NOSE-clear, THROAT-clear, no lesions, no postnasal drip or exudate noted.   NECK:  Supple w/ fair ROM; no JVD; normal carotid impulses w/o bruits; no thyromegaly or nodules palpated; no lymphadenopathy.    RESP  Clear  P & A; w/o, wheezes/ rales/ or rhonchi. no accessory muscle use, no dullness to percussion  CARD:  RRR, no m/r/g, no peripheral edema, pulses intact, no cyanosis or clubbing.  GI:   Soft & nt; nml bowel sounds; no organomegaly or masses detected.    Musco: Warm bil, no deformities or joint swelling noted.   Neuro: alert, no focal deficits noted.    Skin: Warm, no lesions or rashes    Lab Results:  CBC  BMET  No results found for: BNP  ProBNP No results found for: PROBNP  Imaging: No results found.   Assessment & Plan:   COPD (chronic obstructive pulmonary disease) Recent exacerbation now resolved  Plan  Patient Instructions  Continue on TRELEGY daily . Rinse after use.  Continue on Zyrtec 10mg  At bedtime  .  Saline nasal rinses As needed  Continue on Flonase daily  Follow up with Dr. Elsworth Soho  In 6 months and  and As needed   Please contact office for sooner follow up if symptoms do not improve or worsen or seek emergency care        Rhinitis, allergic Cont on Flonase and zyrtec      Rexene Edison, NP 11/21/2017

## 2017-11-21 NOTE — Assessment & Plan Note (Signed)
Cont on Flonase and zyrtec

## 2017-11-21 NOTE — Assessment & Plan Note (Signed)
Recent exacerbation now resolved  Plan  Patient Instructions  Continue on TRELEGY daily . Rinse after use.  Continue on Zyrtec 10mg  At bedtime  .  Saline nasal rinses As needed   Continue on Flonase daily  Follow up with Dr. Elsworth Soho  In 6 months and  and As needed   Please contact office for sooner follow up if symptoms do not improve or worsen or seek emergency care

## 2017-11-28 NOTE — Progress Notes (Signed)
Jacqlyn Krauss MD Reason for referral-Dyspnea  HPI: 77 year old female for evaluation of dyspnea at request of Glendale Chard MD. Patient has had emphysema and asthma in the past.  She also had resection of adenocarcinoma in October 2016.  She has had dyspnea with exertion since that time.  She feels some chest tightness related to her COPD as well that is pleuritic and increases with cough.  Because of the above cardiology asked to evaluate.  Note she also describes pain in her calves bilaterally with ambulation relieved with rest.  Current Outpatient Medications  Medication Sig Dispense Refill  . albuterol (PROVENTIL HFA;VENTOLIN HFA) 108 (90 Base) MCG/ACT inhaler Inhale 2 puffs into the lungs every 6 hours as needed for wheezing or shortness of breath.  18 g 2  . aspirin 81 MG tablet Take 81 mg by mouth every evening.     Marland Kitchen co-enzyme Q-10 30 MG capsule Take 30 mg by mouth daily.    . cyclobenzaprine (FLEXERIL) 5 MG tablet Take 1 tablet (5 mg total) by mouth 3 (three) times daily as needed for muscle spasms. 10 tablet 0  . donepezil (ARICEPT) 10 MG tablet Take 10 mg by mouth every evening.  2  . ezetimibe (ZETIA) 10 MG tablet Take 10 mg by mouth at bedtime.     . Magnesium Oxide (MAG-200) 200 MG TABS Take 1 tablet by mouth daily.    . Melatonin 2.5 MG CAPS Take 1 capsule by mouth at bedtime as needed.    . mirtazapine (REMERON) 15 MG tablet Take 7.5 mg by mouth at bedtime.   0  . Multiple Vitamin (MULTIVITAMIN) tablet Take 1 tablet by mouth daily.    . niacin (NIASPAN) 500 MG CR tablet Take 500 mg by mouth every evening.     Marland Kitchen OVER THE COUNTER MEDICATION Take 1 tablet by mouth daily.    . pantoprazole (PROTONIX) 40 MG tablet Take 40 mg by mouth 2 (two) times daily.    . potassium chloride SA (K-DUR,KLOR-CON) 20 MEQ tablet Take 1 tablet (20 mEq total) by mouth 2 (two) times daily. 14 tablet 0  . RESTASIS 0.05 % ophthalmic emulsion Place 1 drop into both eyes 2 (two) times daily.    4  . TRELEGY ELLIPTA 100-62.5-25 MCG/INH AEPB TAKE 1 PUFF BY MOUTH EVERY DAY 60 each 5  . valACYclovir (VALTREX) 500 MG tablet Take 500 mg by mouth daily.    . VOLTAREN 1 % GEL Apply 1 application topically 4 (four) times daily as needed.  5  . White Petrolatum-Mineral Oil (SYSTANE NIGHTTIME OP) Place 1 drop into both eyes at bedtime as needed. For dry eyes     No current facility-administered medications for this visit.     No Known Allergies   Past Medical History:  Diagnosis Date  . Arthritis   . Asthma   . COPD (chronic obstructive pulmonary disease) ([place])   . Depression   . GERD (gastroesophageal reflux disease)   . Hyperlipidemia   . Hypertension 06/15/2016  . Thyroid nodule     Past Surgical History:  Procedure Laterality Date  . BIOPSY THYROID    . CATARACT EXTRACTION     left eye  . CATARACT EXTRACTION W/ INTRAOCULAR LENS IMPLANT     RIGHT  . CESAREAN SECTION    . EYE SURGERY    . MASS EXCISION N/A 12/13/2014   Procedure: EXCISION OF VOCAL CORD POLYPS;  Surgeon: Jerrell Belfast, MD;  Location: [facility];  Service: ENT;  Laterality: N/A;  . MICROLARYNGOSCOPY N/A 12/13/2014   Procedure: MICROLARYNGOSCOPY;  Surgeon: Jerrell Belfast, MD;  Location: [facility];  Service: ENT;  Laterality: N/A;  . TUBAL LIGATION    . VIDEO ASSISTED THORACOSCOPY (VATS)/ LOBECTOMY Left 07/04/2015   Procedure: VIDEO ASSISTED THORACOSCOPY (VATS) LEFT UPPER LOBECTOMY;  Surgeon: Melrose Nakayama, MD;  Location: [facility];  Service: Thoracic;  Laterality: Left;  Marland Kitchen VIDEO BRONCHOSCOPY WITH INSERTION OF INTERBRONCHIAL VALVE (IBV) N/A 07/11/2015   Procedure: VIDEO BRONCHOSCOPY WITH INSERTION OF INTERBRONCHIAL VALVE (IBV);  Surgeon: Melrose Nakayama, MD;  Location: [name];  Service: Thoracic;  Laterality: N/A;  . VIDEO BRONCHOSCOPY WITH INSERTION OF INTERBRONCHIAL VALVE (IBV) N/A 09/10/2015   Procedure: VIDEO BRONCHOSCOPY WITH REMOVAL OF INTERBRONCHIAL VALVES;  Surgeon: Melrose Nakayama, MD;  Location: [facility];  Service: Thoracic;  Laterality: N/A;    Social History   Socioeconomic History  . Marital status: Widowed    Spouse name: Not on file  . Number of children: 5  . Years of education: Not on file  . Highest education level: Not on file  Occupational History  . Occupation: retired  Scientific laboratory technician  . Financial resource strain: Not on file  . Food insecurity:    Worry: Not on file    Inability: Not on file  . Transportation needs:    Medical: Not on file    Non-medical: Not on file  Tobacco Use  . Smoking status: Former Smoker    Packs/day: 1.00    Years: 56.00    Pack years: 56.00    Types: Cigarettes    Last attempt to quit: 06/22/2015    Years since quitting: 2.4  . Smokeless tobacco: Never Used  Substance and Sexual Activity  . Alcohol use: No    Alcohol/week: 0.0 oz  . Drug use: No  . Sexual activity: Not Currently  Lifestyle  . Physical activity:    Days per week: Not on file    Minutes per session: Not on file  . Stress: Not on file  Relationships  . Social connections:    Talks on phone: Not on file    Gets together: Not on file    Attends religious service: Not on file    Active member of club or organization: Not on file    Attends meetings of clubs or organizations: Not on file    Relationship status: Not on file  . Intimate partner violence:    Fear of current or ex partner: Not on file    Emotionally abused: Not on file    Physically abused: Not on file    Forced sexual activity: Not on file  Other Topics Concern  . Not on file  Social History Narrative  . Not on file    Family History  Problem Relation Age of Onset  . Renal cancer Brother   . Diabetes Mother   . Hypertension Mother   . Renal Disease Mother     ROS: Leg and back pain but no fevers or chills, productive cough, hemoptysis, dysphasia, odynophagia, melena, hematochezia, dysuria, hematuria, rash, seizure activity, orthopnea, PND, pedal edema. Remaining systems are  negative.  Physical Exam:   Blood pressure 116/74, pulse 70, height 5\' 3"  (1.6 m), weight 158 lb 3.2 oz (71.8 kg).  General:  Well developed/well nourished in NAD Skin warm/dry Patient not depressed No peripheral clubbing Back-normal HEENT-normal/normal eyelids Neck supple/normal carotid upstroke bilaterally; no bruits; no JVD; no thyromegaly chest -diminished BS bilaterally CV -  RRR/normal S1 and S2; no murmurs, rubs or gallops;  PMI nondisplaced Abdomen -NT/ND, no HSM, no mass, + bowel sounds, no bruit 1+ femoral pulses, no bruits Ext-no edema, chords, 2+ DP Neuro-grossly nonfocal  ECG -sinus rhythm at a rate of 70.  No ST changes.  Personally reviewed  A/P  1 dyspnea-symptoms likely secondary to lung disease with history of asthma, COPD and lung resection for adenocarcinoma.  I will arrange an echocardiogram to assess LV function.  2 probable claudication-we will arrange ABIs with Doppler.    3 History of hypertension-blood pressure is controlled on no medications.  We will follow.  4 Hyperlipidemia-per IM.   Kirk Ruths, MD

## 2017-12-08 DIAGNOSIS — J449 Chronic obstructive pulmonary disease, unspecified: Secondary | ICD-10-CM | POA: Diagnosis not present

## 2017-12-08 DIAGNOSIS — E782 Mixed hyperlipidemia: Secondary | ICD-10-CM | POA: Diagnosis not present

## 2017-12-08 DIAGNOSIS — R7303 Prediabetes: Secondary | ICD-10-CM | POA: Diagnosis not present

## 2017-12-08 LAB — HEPATIC FUNCTION PANEL
ALK PHOS: 91 (ref 25–125)
ALT: 28 (ref 7–35)
AST: 34 (ref 13–35)

## 2017-12-08 LAB — LIPID PANEL
Cholesterol: 196 (ref 0–200)
HDL: 46 (ref 35–70)
LDL Cholesterol: 128
LDl/HDL Ratio: 2.8
TRIGLYCERIDES: 109 (ref 40–160)

## 2017-12-08 LAB — BASIC METABOLIC PANEL
BUN: 13 (ref 4–21)
CREATININE: 1.8 — AB (ref 0.5–1.1)
GLUCOSE: 108
POTASSIUM: 4.3 (ref 3.4–5.3)
Sodium: 143 (ref 137–147)

## 2017-12-08 LAB — HEMOGLOBIN A1C: HEMOGLOBIN A1C: 6.6

## 2017-12-12 ENCOUNTER — Ambulatory Visit (INDEPENDENT_AMBULATORY_CARE_PROVIDER_SITE_OTHER): Payer: Medicare HMO | Admitting: Cardiology

## 2017-12-12 ENCOUNTER — Encounter: Payer: Self-pay | Admitting: Cardiology

## 2017-12-12 VITALS — BP 116/74 | HR 70 | Ht 63.0 in | Wt 158.2 lb

## 2017-12-12 DIAGNOSIS — I739 Peripheral vascular disease, unspecified: Secondary | ICD-10-CM

## 2017-12-12 DIAGNOSIS — R0602 Shortness of breath: Secondary | ICD-10-CM

## 2017-12-12 DIAGNOSIS — E7800 Pure hypercholesterolemia, unspecified: Secondary | ICD-10-CM | POA: Diagnosis not present

## 2017-12-12 DIAGNOSIS — I10 Essential (primary) hypertension: Secondary | ICD-10-CM

## 2017-12-12 NOTE — Patient Instructions (Signed)
Medication Instructions:   NO CHANGE  Testing/Procedures:  Your physician has requested that you have an echocardiogram. Echocardiography is a painless test that uses sound waves to create images of your heart. It provides your doctor with information about the size and shape of your heart and how well your heart's chambers and valves are working. This procedure takes approximately one hour. There are no restrictions for this procedure.   Your physician has requested that you have a lower extremity arterial duplex. During this test, ultrasound are used to evaluate arterial blood flow in the legs. Allow one hour for this exam. There are no restrictions or special instructions.   Follow-Up:  Your physician wants you to follow-up in: [place] will receive a reminder letter in the mail two months in advance. If you don't receive a letter, please call our office to schedule the follow-up appointment. 3

## 2017-12-14 NOTE — Addendum Note (Signed)
Addended by: Cristopher Estimable on: 12/14/2017 08:18 AM   Modules accepted: Orders

## 2017-12-14 NOTE — Addendum Note (Signed)
Addended by: Benson Setting L on: 12/14/2017 08:17 AM   Modules accepted: Orders

## 2017-12-19 ENCOUNTER — Ambulatory Visit (HOSPITAL_COMMUNITY): Payer: Medicare HMO | Attending: Cardiology

## 2017-12-19 ENCOUNTER — Other Ambulatory Visit: Payer: Self-pay

## 2017-12-19 DIAGNOSIS — E785 Hyperlipidemia, unspecified: Secondary | ICD-10-CM | POA: Insufficient documentation

## 2017-12-19 DIAGNOSIS — J449 Chronic obstructive pulmonary disease, unspecified: Secondary | ICD-10-CM | POA: Insufficient documentation

## 2017-12-19 DIAGNOSIS — I10 Essential (primary) hypertension: Secondary | ICD-10-CM | POA: Diagnosis not present

## 2017-12-19 DIAGNOSIS — Z87891 Personal history of nicotine dependence: Secondary | ICD-10-CM | POA: Diagnosis not present

## 2017-12-19 DIAGNOSIS — R0602 Shortness of breath: Secondary | ICD-10-CM | POA: Diagnosis not present

## 2017-12-20 ENCOUNTER — Encounter: Payer: Self-pay | Admitting: Cardiology

## 2017-12-20 NOTE — Telephone Encounter (Signed)
This encounter was created in error - please disregard.

## 2017-12-20 NOTE — Telephone Encounter (Signed)
New Message ° ° °Pt returning call for nurse °

## 2017-12-22 ENCOUNTER — Ambulatory Visit (HOSPITAL_COMMUNITY)
Admission: RE | Admit: 2017-12-22 | Discharge: 2017-12-22 | Disposition: A | Discharge status: Home / Self Care | Payer: Medicare HMO | Source: Ambulatory Visit | Attending: Internal Medicine | Admitting: Internal Medicine

## 2017-12-22 ENCOUNTER — Encounter (HOSPITAL_COMMUNITY): Payer: Self-pay

## 2017-12-22 ENCOUNTER — Inpatient Hospital Stay: Payer: Medicare HMO | Attending: Internal Medicine

## 2017-12-22 DIAGNOSIS — C3492 Malignant neoplasm of unspecified part of left bronchus or lung: Secondary | ICD-10-CM | POA: Insufficient documentation

## 2017-12-22 DIAGNOSIS — R062 Wheezing: Secondary | ICD-10-CM | POA: Insufficient documentation

## 2017-12-22 DIAGNOSIS — I700 Atherosclerosis of aorta: Secondary | ICD-10-CM | POA: Diagnosis not present

## 2017-12-22 DIAGNOSIS — C3412 Malignant neoplasm of upper lobe, left bronchus or lung: Secondary | ICD-10-CM | POA: Diagnosis not present

## 2017-12-22 DIAGNOSIS — R0609 Other forms of dyspnea: Secondary | ICD-10-CM | POA: Diagnosis not present

## 2017-12-22 DIAGNOSIS — C3490 Malignant neoplasm of unspecified part of unspecified bronchus or lung: Secondary | ICD-10-CM

## 2017-12-22 DIAGNOSIS — J439 Emphysema, unspecified: Secondary | ICD-10-CM | POA: Diagnosis not present

## 2017-12-22 DIAGNOSIS — I10 Essential (primary) hypertension: Secondary | ICD-10-CM

## 2017-12-22 LAB — CBC WITH DIFFERENTIAL/PLATELET
BASOS PCT: 1 %
Basophils Absolute: 0 10*3/uL (ref 0.0–0.1)
EOS ABS: 0.1 10*3/uL (ref 0.0–0.5)
Eosinophils Relative: 2 %
HCT: 42.8 % (ref 34.8–46.6)
HEMOGLOBIN: 14 g/dL (ref 11.6–15.9)
LYMPHS ABS: 3.1 10*3/uL (ref 0.9–3.3)
Lymphocytes Relative: 47 %
MCH: 31.3 pg (ref 25.1–34.0)
MCHC: 32.6 g/dL (ref 31.5–36.0)
MCV: 95.9 fL (ref 79.5–101.0)
MONO ABS: 0.7 10*3/uL (ref 0.1–0.9)
MONOS PCT: 11 %
Neutro Abs: 2.5 10*3/uL (ref 1.5–6.5)
Neutrophils Relative %: 39 %
Platelets: 219 10*3/uL (ref 145–400)
RBC: 4.47 MIL/uL (ref 3.70–5.45)
RDW: 15.7 % — AB (ref 11.2–14.5)
WBC: 6.4 10*3/uL (ref 3.9–10.3)

## 2017-12-22 LAB — COMPREHENSIVE METABOLIC PANEL
ALBUMIN: 4 g/dL (ref 3.5–5.0)
ALK PHOS: 93 U/L (ref 40–150)
ALT: 30 U/L (ref 0–55)
ANION GAP: 7 (ref 3–11)
AST: 35 U/L — ABNORMAL HIGH (ref 5–34)
BUN: 17 mg/dL (ref 7–26)
CALCIUM: 10 mg/dL (ref 8.4–10.4)
CO2: 30 mmol/L — ABNORMAL HIGH (ref 22–29)
Chloride: 105 mmol/L (ref 98–109)
Creatinine, Ser: 1.21 mg/dL — ABNORMAL HIGH (ref 0.60–1.10)
GFR calc non Af Amer: 42 mL/min — ABNORMAL LOW (ref >60)
GFR, EST AFRICAN AMERICAN: 49 mL/min — AB (ref >60)
GLUCOSE: 126 mg/dL (ref 70–140)
POTASSIUM: 3.8 mmol/L (ref 3.5–5.1)
Sodium: 142 mmol/L (ref 136–145)
Total Bilirubin: 0.6 mg/dL (ref 0.2–1.2)
Total Protein: 7.5 g/dL (ref 6.4–8.3)

## 2017-12-22 MED ORDER — IOHEXOL 300 MG/ML  SOLN
100.0000 mL | Freq: Once | INTRAMUSCULAR | Status: AC | PRN
  Administered 2017-12-22: 75 mL via INTRAVENOUS

## 2017-12-23 ENCOUNTER — Ambulatory Visit (HOSPITAL_COMMUNITY)
Admission: RE | Admit: 2017-12-23 | Payer: Medicare HMO | Source: Ambulatory Visit | Attending: Cardiology | Admitting: Cardiology

## 2017-12-26 ENCOUNTER — Inpatient Hospital Stay (HOSPITAL_BASED_OUTPATIENT_CLINIC_OR_DEPARTMENT_OTHER): Payer: Medicare HMO | Admitting: Internal Medicine

## 2017-12-26 ENCOUNTER — Encounter: Payer: Self-pay | Admitting: Internal Medicine

## 2017-12-26 ENCOUNTER — Telehealth: Payer: Self-pay

## 2017-12-26 VITALS — BP 140/76 | HR 81 | Temp 98.5°F | Resp 24 | Ht 63.0 in | Wt 160.6 lb

## 2017-12-26 DIAGNOSIS — I10 Essential (primary) hypertension: Secondary | ICD-10-CM

## 2017-12-26 DIAGNOSIS — C3490 Malignant neoplasm of unspecified part of unspecified bronchus or lung: Secondary | ICD-10-CM

## 2017-12-26 DIAGNOSIS — C3412 Malignant neoplasm of upper lobe, left bronchus or lung: Secondary | ICD-10-CM | POA: Diagnosis not present

## 2017-12-26 DIAGNOSIS — R0602 Shortness of breath: Secondary | ICD-10-CM

## 2017-12-26 DIAGNOSIS — C3411 Malignant neoplasm of upper lobe, right bronchus or lung: Secondary | ICD-10-CM

## 2017-12-26 DIAGNOSIS — R062 Wheezing: Secondary | ICD-10-CM | POA: Diagnosis not present

## 2017-12-26 DIAGNOSIS — R0609 Other forms of dyspnea: Secondary | ICD-10-CM | POA: Diagnosis not present

## 2017-12-26 DIAGNOSIS — C3492 Malignant neoplasm of unspecified part of left bronchus or lung: Secondary | ICD-10-CM

## 2017-12-26 NOTE — Telephone Encounter (Signed)
Printed avs and calender of upcoming appointment.per 4/22 los

## 2017-12-26 NOTE — Progress Notes (Signed)
[facility] [phone]) [phone]   [phone]  OFFICE PROGRESS NOTE  Glendale Chard, [address] [address]  DIAGNOSIS: Stage IB (T2a, N0, M0) non-small cell lung cancer, adenocarcinoma presented with left upper lobe nodule diagnosed in September 2016.  PRIOR THERAPY: Status post left VATS with left upper lobectomy and mediastinal lymph node dissection on 07/04/2015 under the care of Dr. Roxan Hockey.  CURRENT THERAPY: Observation.  INTERVAL HISTORY: [name] 77 y.o. female returns to the clinic today for follow-up visit.  The patient is feeling fine today with no specific complaints.  She denied having any chest pain, cough or hemoptysis but has shortness of breath with exertion and wheezing.  She is currently on inhalers by Dr. Elsworth Soho.  She denied having any weight loss or night sweats.  She denied having any nausea, vomiting, diarrhea or constipation.  She had repeat CT scan of the chest performed recently and she is here for evaluation and discussion of her risk her results.  MEDICAL HISTORY: Past Medical History:  Diagnosis Date  . Arthritis   . Asthma   . COPD (chronic obstructive pulmonary disease) ([place])   . Depression   . GERD (gastroesophageal reflux disease)   . Hyperlipidemia   . Hypertension 06/15/2016  . Thyroid nodule     ALLERGIES:  has No Known Allergies.  MEDICATIONS:  Current Outpatient Medications  Medication Sig Dispense Refill  . albuterol (PROVENTIL HFA;VENTOLIN HFA) 108 (90 Base) MCG/ACT inhaler Inhale 2 puffs into the lungs every 6 hours as needed for wheezing or shortness of breath.  18 g 2  . aspirin 81 MG tablet Take 81 mg by mouth every evening.     Marland Kitchen co-enzyme Q-10 30 MG capsule Take 30 mg by mouth daily.    . cyclobenzaprine (FLEXERIL) 5 MG tablet Take 1 tablet (5 mg total) by mouth 3 (three) times daily as needed for muscle spasms. 10 tablet 0  . donepezil (ARICEPT) 10 MG tablet  Take 10 mg by mouth every evening.  2  . ezetimibe (ZETIA) 10 MG tablet Take 10 mg by mouth at bedtime.     . Magnesium Oxide (MAG-200) 200 MG TABS Take 1 tablet by mouth daily.    . Melatonin 2.5 MG CAPS Take 1 capsule by mouth at bedtime as needed.    . mirtazapine (REMERON) 15 MG tablet Take 7.5 mg by mouth at bedtime.   0  . Multiple Vitamin (MULTIVITAMIN) tablet Take 1 tablet by mouth daily.    . niacin (NIASPAN) 500 MG CR tablet Take 500 mg by mouth every evening.     Marland Kitchen OVER THE COUNTER MEDICATION Take 1 tablet by mouth daily.    . pantoprazole (PROTONIX) 40 MG tablet Take 40 mg by mouth 2 (two) times daily.    . potassium chloride SA (K-DUR,KLOR-CON) 20 MEQ tablet Take 1 tablet (20 mEq total) by mouth 2 (two) times daily. 14 tablet 0  . RESTASIS 0.05 % ophthalmic emulsion Place 1 drop into both eyes 2 (two) times daily.   4  . TRELEGY ELLIPTA 100-62.5-25 MCG/INH AEPB TAKE 1 PUFF BY MOUTH EVERY DAY 60 each 5  . valACYclovir (VALTREX) 500 MG tablet Take 500 mg by mouth daily.    . VOLTAREN 1 % GEL Apply 1 application topically 4 (four) times daily as needed.  5  . White Petrolatum-Mineral Oil (SYSTANE NIGHTTIME OP) Place 1 drop into both eyes at bedtime as needed.  For dry eyes     No current facility-administered medications for this visit.     SURGICAL HISTORY:  Past Surgical History:  Procedure Laterality Date  . BIOPSY THYROID    . CATARACT EXTRACTION     left eye  . CATARACT EXTRACTION W/ INTRAOCULAR LENS IMPLANT     RIGHT  . CESAREAN SECTION    . EYE SURGERY    . MASS EXCISION N/A 12/13/2014   Procedure: EXCISION OF VOCAL CORD POLYPS;  Surgeon: Jerrell Belfast, MD;  Location: [facility];  Service: ENT;  Laterality: N/A;  . MICROLARYNGOSCOPY N/A 12/13/2014   Procedure: MICROLARYNGOSCOPY;  Surgeon: Jerrell Belfast, MD;  Location: [facility];  Service: ENT;  Laterality: N/A;  . TUBAL LIGATION    . VIDEO ASSISTED THORACOSCOPY (VATS)/ LOBECTOMY Left 07/04/2015   Procedure: VIDEO ASSISTED  THORACOSCOPY (VATS) LEFT UPPER LOBECTOMY;  Surgeon: Melrose Nakayama, MD;  Location: [facility];  Service: Thoracic;  Laterality: Left;  Marland Kitchen VIDEO BRONCHOSCOPY WITH INSERTION OF INTERBRONCHIAL VALVE (IBV) N/A 07/11/2015   Procedure: VIDEO BRONCHOSCOPY WITH INSERTION OF INTERBRONCHIAL VALVE (IBV);  Surgeon: Melrose Nakayama, MD;  Location: [facility];  Service: Thoracic;  Laterality: N/A;  . VIDEO BRONCHOSCOPY WITH INSERTION OF INTERBRONCHIAL VALVE (IBV) N/A 09/10/2015   Procedure: VIDEO BRONCHOSCOPY WITH REMOVAL OF INTERBRONCHIAL VALVES;  Surgeon: Melrose Nakayama, MD;  Location: [facility];  Service: Thoracic;  Laterality: N/A;    REVIEW OF SYSTEMS:  A comprehensive review of systems was negative except for: Respiratory: positive for dyspnea on exertion   PHYSICAL EXAMINATION: General appearance: alert, cooperative and no distress Head: Normocephalic, without obvious abnormality, atraumatic Neck: no adenopathy, no JVD, supple, symmetrical, trachea midline and thyroid not enlarged, symmetric, no tenderness/mass/nodules Lymph nodes: Cervical, supraclavicular, and axillary nodes normal. Resp: clear to auscultation bilaterally Back: symmetric, no curvature. ROM normal. No CVA tenderness. Cardio: regular rate and rhythm, S1, S2 normal, no murmur, click, rub or gallop GI: soft, non-tender; bowel sounds normal; no masses,  no organomegaly Extremities: extremities normal, atraumatic, no cyanosis or edema  ECOG PERFORMANCE STATUS: 1 - Symptomatic but completely ambulatory  Blood pressure 140/76, pulse 81, temperature 98.5 F (36.9 C), temperature source Oral, resp. rate (!) 24, height 5\' 3"  (1.6 m), weight 160 lb 9.6 oz (72.8 kg), SpO2 98 %.  LABORATORY DATA: Lab Results  Component Value Date   WBC 6.4 12/22/2017   HGB 14.0 12/22/2017   HCT 42.8 12/22/2017   MCV 95.9 12/22/2017   PLT 219 12/22/2017      Chemistry      Component Value Date/Time   NA 142 12/22/2017 1003   NA 142 06/20/2017 1502     K 3.8 12/22/2017 1003   K 4.0 06/20/2017 1502   CL 105 12/22/2017 1003   CO2 30 (H) 12/22/2017 1003   CO2 29 06/20/2017 1502   BUN 17 12/22/2017 1003   BUN 16.0 06/20/2017 1502   CREATININE 1.21 (H) 12/22/2017 1003   CREATININE 1.2 (H) 06/20/2017 1502      Component Value Date/Time   CALCIUM 10.0 12/22/2017 1003   CALCIUM 9.8 06/20/2017 1502   ALKPHOS 93 12/22/2017 1003   ALKPHOS 74 06/20/2017 1502   AST 35 (H) 12/22/2017 1003   AST 35 (H) 06/20/2017 1502   ALT 30 12/22/2017 1003   ALT 30 06/20/2017 1502   BILITOT 0.6 12/22/2017 1003   BILITOT 0.94 06/20/2017 1502       RADIOGRAPHIC STUDIES: Ct Chest W Contrast  Result Date: 12/22/2017 CLINICAL  DATA:  Left upper lobe non-small cell lung cancer. Status post left upper lobectomy. EXAM: CT CHEST WITH CONTRAST TECHNIQUE: Multidetector CT imaging of the chest was performed during intravenous contrast administration. CONTRAST:  70mL OMNIPAQUE IOHEXOL 300 MG/ML  SOLN COMPARISON:  06/20/2017 FINDINGS: Cardiovascular: The heart size is normal. No pericardial effusion. Coronary artery calcification is evident. Atherosclerotic calcification is noted in the wall of the thoracic aorta. Mediastinum/Nodes: No mediastinal lymphadenopathy. 9 mm short axis right hilar lymph node measured previously is 8 mm today. Surgical changes noted in the left hilum without adenopathy. The esophagus has normal imaging features. Nodularity in both thyroid glands is similar to prior. There is no axillary lymphadenopathy. Lungs/Pleura: Volume loss in the left hemithorax is compatible with prior left upper lobectomy. Centrilobular and paraseptal emphysema noted. Stable appearance of right apical scarring. No suspicious pulmonary nodule or mass in the right lung. The area of architectural distortion and nodular focal opacity in periphery of the left lung base is similar. Nodular component measures 8 x 20 mm today compared 8 x 19 mm previously. No new or suspicious left  pulmonary nodule or mass. No evidence for pulmonary edema or pleural effusion. Upper Abdomen: Unremarkable. Musculoskeletal: Bone windows reveal no worrisome lytic or sclerotic osseous lesions. Bone windows reveal no worrisome lytic or sclerotic osseous lesions. IMPRESSION: 1. Stable exam.  No new or progressive interval findings. 2. Area of apparent scarring in the peripheral left lung base is unchanged in the interval. 3.  Aortic Atherosclerois (ICD10-170.0) 4.  Emphysema. ([id number]) Electronically Signed   By: Misty Stanley M.D.   On: 12/22/2017 16:57    ASSESSMENT AND PLAN:  This is a very pleasant 78 years old African-American female with a stage IB non-small cell lung cancer status post right upper lobectomy with lymph node dissection in September 2016. The patient continues to do fine with no concerning complaints except for shortness of breath with exertion. She had repeat CT scan of the chest performed recently.  I personally and independently reviewed the scans and discussed the results with the patient today.  Had a scan showed no concerning findings for disease recurrence. I recommended for her to continue in observation with repeat CT scan of the chest in 1 year. She was advised to call immediately if she has any concerning symptoms in the interval. The patient voices understanding of current disease status and treatment options and is in agreement with the current care plan. All questions were answered. The patient knows to call the clinic with any problems, questions or concerns. We can certainly see the patient much sooner if necessary. I spent 10 minutes counseling the patient face to face. The total time spent in the appointment was 15 minutes.  Disclaimer: This note was dictated with voice recognition software. Similar sounding words can inadvertently be transcribed and may not be corrected upon review.

## 2017-12-30 ENCOUNTER — Ambulatory Visit (HOSPITAL_COMMUNITY)
Admission: RE | Admit: 2017-12-30 | Discharge: 2017-12-30 | Disposition: A | Discharge status: Home / Self Care | Payer: Medicare HMO | Source: Ambulatory Visit | Attending: Internal Medicine | Admitting: Internal Medicine

## 2017-12-30 DIAGNOSIS — I739 Peripheral vascular disease, unspecified: Secondary | ICD-10-CM | POA: Diagnosis not present

## 2018-01-28 ENCOUNTER — Other Ambulatory Visit: Payer: Self-pay | Admitting: Pulmonary Disease

## 2018-02-14 DIAGNOSIS — H04123 Dry eye syndrome of bilateral lacrimal glands: Secondary | ICD-10-CM | POA: Diagnosis not present

## 2018-02-14 DIAGNOSIS — H35033 Hypertensive retinopathy, bilateral: Secondary | ICD-10-CM | POA: Diagnosis not present

## 2018-02-14 DIAGNOSIS — H35361 Drusen (degenerative) of macula, right eye: Secondary | ICD-10-CM | POA: Diagnosis not present

## 2018-02-14 DIAGNOSIS — Z961 Presence of intraocular lens: Secondary | ICD-10-CM | POA: Diagnosis not present

## 2018-04-05 ENCOUNTER — Other Ambulatory Visit: Payer: Self-pay | Admitting: Pulmonary Disease

## 2018-05-23 NOTE — Progress Notes (Signed)
HPI: FU dyspnea. Patient has had emphysema and asthma in the past.  She also had resection of adenocarcinoma in October 2016.  She has had dyspnea with exertion since that time.  Chest CT April 2019 showed emphysema and scarring in the left lung base. Echo 4/19 showed vigorous LV systolic function, grade 1 DD. ABIs 4/19 at rest normal. Since last seen, she continues to have dyspnea on exertion but denies orthopnea, PND, pedal edema, chest pain or syncope.  Current Outpatient Medications  Medication Sig Dispense Refill  . aspirin 81 MG tablet Take 81 mg by mouth every evening.     Marland Kitchen co-enzyme Q-10 30 MG capsule Take 30 mg by mouth daily.    . cyclobenzaprine (FLEXERIL) 5 MG tablet Take 1 tablet (5 mg total) by mouth 3 (three) times daily as needed for muscle spasms. 10 tablet 0  . donepezil (ARICEPT) 10 MG tablet Take 10 mg by mouth every evening.  2  . ezetimibe (ZETIA) 10 MG tablet Take 10 mg by mouth at bedtime.     . Magnesium Oxide (MAG-200) 200 MG TABS Take 1 tablet by mouth daily.    . Melatonin 2.5 MG CAPS Take 1 capsule by mouth at bedtime as needed.    . mirtazapine (REMERON) 15 MG tablet Take 7.5 mg by mouth at bedtime.   0  . Multiple Vitamin (MULTIVITAMIN) tablet Take 1 tablet by mouth daily.    . niacin (NIASPAN) 500 MG CR tablet Take 500 mg by mouth every evening.     Marland Kitchen OVER THE COUNTER MEDICATION Take 1 tablet by mouth daily.    . pantoprazole (PROTONIX) 40 MG tablet Take 40 mg by mouth 2 (two) times daily.    . potassium chloride SA (K-DUR,KLOR-CON) 20 MEQ tablet Take 1 tablet (20 mEq total) by mouth 2 (two) times daily. 14 tablet 0  . predniSONE (DELTASONE) 10 MG tablet 2 tabs daily X5 days, then 1 tab daily X5 days. 15 tablet 0  . RESTASIS 0.05 % ophthalmic emulsion Place 1 drop into both eyes 2 (two) times daily.   4  . TRELEGY ELLIPTA 100-62.5-25 MCG/INH AEPB TAKE 1 PUFF BY MOUTH EVERY DAY 60 each 5  . valACYclovir (VALTREX) 500 MG tablet Take 500 mg by mouth daily.     . VENTOLIN HFA 108 (90 Base) MCG/ACT inhaler INHALE 2 PUFFS INTO THE LUNGS EVERY 6 HOURS AS NEEDED FOR WHEEZING OR SHORTNESS OF BREATH. 18 Inhaler 2  . VOLTAREN 1 % GEL Apply 1 application topically 4 (four) times daily as needed.  5  . White Petrolatum-Mineral Oil (SYSTANE NIGHTTIME OP) Place 1 drop into both eyes at bedtime as needed. For dry eyes     No current facility-administered medications for this visit.      Past Medical History:  Diagnosis Date  . Arthritis   . Asthma   . COPD (chronic obstructive pulmonary disease) ([place])   . Depression   . GERD (gastroesophageal reflux disease)   . Hyperlipidemia   . Hypertension 06/15/2016  . Thyroid nodule     Past Surgical History:  Procedure Laterality Date  . BIOPSY THYROID    . CATARACT EXTRACTION     left eye  . CATARACT EXTRACTION W/ INTRAOCULAR LENS IMPLANT     RIGHT  . CESAREAN SECTION    . EYE SURGERY    . MASS EXCISION N/A 12/13/2014   Procedure: EXCISION OF VOCAL CORD POLYPS;  Surgeon: Jerrell Belfast, MD;  Location:  [facility];  Service: ENT;  Laterality: N/A;  . MICROLARYNGOSCOPY N/A 12/13/2014   Procedure: MICROLARYNGOSCOPY;  Surgeon: Jerrell Belfast, MD;  Location: [facility];  Service: ENT;  Laterality: N/A;  . TUBAL LIGATION    . VIDEO ASSISTED THORACOSCOPY (VATS)/ LOBECTOMY Left 07/04/2015   Procedure: VIDEO ASSISTED THORACOSCOPY (VATS) LEFT UPPER LOBECTOMY;  Surgeon: Melrose Nakayama, MD;  Location: [facility];  Service: Thoracic;  Laterality: Left;  Marland Kitchen VIDEO BRONCHOSCOPY WITH INSERTION OF INTERBRONCHIAL VALVE (IBV) N/A 07/11/2015   Procedure: VIDEO BRONCHOSCOPY WITH INSERTION OF INTERBRONCHIAL VALVE (IBV);  Surgeon: Melrose Nakayama, MD;  Location: [facility];  Service: Thoracic;  Laterality: N/A;  . VIDEO BRONCHOSCOPY WITH INSERTION OF INTERBRONCHIAL VALVE (IBV) N/A 09/10/2015   Procedure: VIDEO BRONCHOSCOPY WITH REMOVAL OF INTERBRONCHIAL VALVES;  Surgeon: Melrose Nakayama, MD;  Location: [facility];  Service: Thoracic;   Laterality: N/A;    Social History   Socioeconomic History  . Marital status: Widowed    Spouse name: Not on file  . Number of children: 5  . Years of education: Not on file  . Highest education level: Not on file  Occupational History  . Occupation: retired  Scientific laboratory technician  . Financial resource strain: Not on file  . Food insecurity:    Worry: Not on file    Inability: Not on file  . Transportation needs:    Medical: Not on file    Non-medical: Not on file  Tobacco Use  . Smoking status: Former Smoker    Packs/day: 1.00    Years: 56.00    Pack years: 56.00    Types: Cigarettes    Last attempt to quit: 06/22/2015    Years since quitting: 2.9  . Smokeless tobacco: Never Used  Substance and Sexual Activity  . Alcohol use: No    Alcohol/week: 0.0 standard drinks  . Drug use: No  . Sexual activity: Not Currently  Lifestyle  . Physical activity:    Days per week: Not on file    Minutes per session: Not on file  . Stress: Not on file  Relationships  . Social connections:    Talks on phone: Not on file    Gets together: Not on file    Attends religious service: Not on file    Active member of club or organization: Not on file    Attends meetings of clubs or organizations: Not on file    Relationship status: Not on file  . Intimate partner violence:    Fear of current or ex partner: Not on file    Emotionally abused: Not on file    Physically abused: Not on file    Forced sexual activity: Not on file  Other Topics Concern  . Not on file  Social History Narrative  . Not on file    Family History  Problem Relation Age of Onset  . Renal cancer Brother   . Diabetes Mother   . Hypertension Mother   . Renal Disease Mother     ROS: no fevers or chills, productive cough, hemoptysis, dysphasia, odynophagia, melena, hematochezia, dysuria, hematuria, rash, seizure activity, orthopnea, PND, pedal edema, claudication. Remaining systems are negative.  Physical  Exam: Well-developed well-nourished in no acute distress.  Skin is warm and dry.  HEENT is normal.  Neck is supple.  Chest is clear to auscultation with normal expansion.  Cardiovascular exam is regular rate and rhythm.  Abdominal exam nontender or distended. No masses palpated. Extremities show no edema. neuro grossly  intact  ECG-sinus rhythm at a rate of 83.  Frequent PVCs.  Personally reviewed  A/P  1 dyspnea-symptoms felt secondary to lung disease including previous lung resection, COPD and history of asthma.  They improve with steroids and inhalers. Echocardiogram showed normal LV function.  We will not pursue further cardiac work-up unless her symptoms change.  2 hypertension-patient's blood pressure is now controlled on no medications.  3 hyperlipidemia-per internal medicine.  Kirk Ruths, MD

## 2018-05-27 ENCOUNTER — Encounter: Payer: Self-pay | Admitting: Internal Medicine

## 2018-05-27 DIAGNOSIS — R7303 Prediabetes: Secondary | ICD-10-CM | POA: Insufficient documentation

## 2018-05-31 ENCOUNTER — Ambulatory Visit (INDEPENDENT_AMBULATORY_CARE_PROVIDER_SITE_OTHER): Payer: Medicare HMO | Admitting: Pulmonary Disease

## 2018-05-31 ENCOUNTER — Encounter: Payer: Self-pay | Admitting: Pulmonary Disease

## 2018-05-31 DIAGNOSIS — J441 Chronic obstructive pulmonary disease with (acute) exacerbation: Secondary | ICD-10-CM | POA: Diagnosis not present

## 2018-05-31 DIAGNOSIS — Z23 Encounter for immunization: Secondary | ICD-10-CM

## 2018-05-31 MED ORDER — PREDNISONE 10 MG PO TABS: ORAL_TABLET | ORAL | 0 refills | Status: DC

## 2018-05-31 NOTE — Assessment & Plan Note (Signed)
Annual surveillance CT

## 2018-05-31 NOTE — Assessment & Plan Note (Signed)
We will treat his flare mainly due to allergies  Prednisone 10 mg tabs  Take 2 tabs daily with food x 5ds, then 1 tab daily with food x 5ds then STOP Ct trelegy Flu shot today

## 2018-05-31 NOTE — Patient Instructions (Signed)
Prednisone 10 mg tabs  Take 2 tabs daily with food x 5ds, then 1 tab daily with food x 5ds then STOP  Flu shot today

## 2018-05-31 NOTE — Progress Notes (Signed)
   Subjective:    Patient ID: [name], female    DOB: [birth year], 77 y.o.   MRN: [id number]  HPI  77 year old ex-smoker for follow-up of COPD and lung cancer She smoked about 60 pack years. She worked in a [employer] for 20 years  06/2015 underwent left VATS with left upper lobectomy and mediastinal lymph node dissection. Path showed invasive adenocarcinoma, adenocarcinoma involving the visceral pleura in addition to lymphovascular invasion. The resection margins as well as the dissected lymph nodes were negative for malignancy. She had persistent air leak postop and required endobronchial valves for resolution.  She has been compliant with trilogy.  Reports increased dyspnea due to allergies with occasional wheezing.  She has coughing with activity. She takes Zyrtec daily. Compliant with Protonix for reflux.  Reviewed surveillance CT scan from 12/2017  Significant tests/ events  12/2017 CT chest >> stable, nodular area of left lung scarring 2016 Pre-op PFT - FEV1 73 %, DLCO 48%  03/2016 FEV1 66%  Spirometry 10/2017 shows an FEV1 at 63% ratio 57 and FVC of 86%.   Review of Systems neg for any significant sore throat, dysphagia, itching, sneezing, nasal congestion or excess/ purulent secretions, fever, chills, sweats, unintended wt loss, pleuritic or exertional cp, hempoptysis, orthopnea pnd or change in chronic leg swelling. Also denies presyncope, palpitations, heartburn, abdominal pain, nausea, vomiting, diarrhea or change in bowel or urinary habits, dysuria,hematuria, rash, arthralgias, visual complaints, headache, numbness weakness or ataxia.     Objective:   Physical Exam   Gen. Pleasant, well-nourished, in no distress ENT - no thrush, no post nasal drip Neck: No JVD, no thyromegaly, no carotid bruits Lungs: no use of accessory muscles, no dullness to percussion, decreased  without rales or rhonchi  Cardiovascular: Rhythm regular, heart sounds  normal, no  murmurs or gallops, no peripheral edema Musculoskeletal: No deformities, no cyanosis or clubbing         Assessment & Plan:

## 2018-06-01 ENCOUNTER — Encounter: Payer: Self-pay | Admitting: Cardiology

## 2018-06-01 ENCOUNTER — Ambulatory Visit (INDEPENDENT_AMBULATORY_CARE_PROVIDER_SITE_OTHER): Payer: Medicare HMO | Admitting: Cardiology

## 2018-06-01 VITALS — BP 124/74 | HR 83 | Ht 63.0 in | Wt 157.0 lb

## 2018-06-01 DIAGNOSIS — E7800 Pure hypercholesterolemia, unspecified: Secondary | ICD-10-CM

## 2018-06-01 DIAGNOSIS — I10 Essential (primary) hypertension: Secondary | ICD-10-CM | POA: Diagnosis not present

## 2018-06-01 DIAGNOSIS — R0602 Shortness of breath: Secondary | ICD-10-CM | POA: Diagnosis not present

## 2018-06-01 NOTE — Patient Instructions (Signed)
Your physician recommends that you schedule a follow-up appointment in: AS NEEDED  

## 2018-06-14 ENCOUNTER — Encounter: Payer: Self-pay | Admitting: Nurse Practitioner

## 2018-06-14 ENCOUNTER — Ambulatory Visit (INDEPENDENT_AMBULATORY_CARE_PROVIDER_SITE_OTHER): Payer: PRIVATE HEALTH INSURANCE | Admitting: Nurse Practitioner

## 2018-06-14 ENCOUNTER — Ambulatory Visit (INDEPENDENT_AMBULATORY_CARE_PROVIDER_SITE_OTHER): Payer: PRIVATE HEALTH INSURANCE

## 2018-06-14 VITALS — BP 130/78 | HR 84 | Temp 98.7°F | Ht 62.5 in | Wt 158.4 lb

## 2018-06-14 VITALS — BP 130/78 | HR 84 | Temp 98.7°F | Ht 62.5 in | Wt 158.0 lb

## 2018-06-14 DIAGNOSIS — R252 Cramp and spasm: Secondary | ICD-10-CM | POA: Diagnosis not present

## 2018-06-14 DIAGNOSIS — I10 Essential (primary) hypertension: Secondary | ICD-10-CM | POA: Diagnosis not present

## 2018-06-14 DIAGNOSIS — E782 Mixed hyperlipidemia: Secondary | ICD-10-CM

## 2018-06-14 DIAGNOSIS — E119 Type 2 diabetes mellitus without complications: Secondary | ICD-10-CM | POA: Diagnosis not present

## 2018-06-14 DIAGNOSIS — Z0000 Encounter for general adult medical examination without abnormal findings: Secondary | ICD-10-CM

## 2018-06-14 MED ORDER — PNEUMOCOCCAL 13-VAL CONJ VACC IM SUSP: 0.5000 mL | INTRAMUSCULAR | 0 refills | Status: AC

## 2018-06-14 MED ORDER — CYCLOBENZAPRINE HCL 5 MG PO TABS: 5.0000 mg | ORAL_TABLET | Freq: Three times a day (TID) | ORAL | 0 refills | Status: DC | PRN

## 2018-06-14 NOTE — Progress Notes (Signed)
Subjective:   [name] is a 77 y.o. female who presents for Medicare Annual (Subsequent) preventive examination.  [place] of Systems:  N/A Cardiac Risk Factors include: advanced age (>8men, >72 women);hypertension;sedentary lifestyle     Objective:     Vitals: BP 130/78   Pulse 84   Temp 98.7 F (37.1 C)   Ht 5' 2.5" (1.588 m)   Wt 158 lb (71.7 kg)   BMI 28.44 kg/m   Body mass index is 28.44 kg/m.  Advanced Directives 06/14/2018 08/21/2017 06/04/2016 11/24/2015 09/18/2015 09/03/2015 08/04/2015  Does Patient Have a Medical Advance Directive? No No No No Yes;No Yes No  Type of Advance Directive - - - - (No Data) - -  Does patient want to make changes to medical advance directive? - - No - Patient declined - - - -  Would patient like information on creating a medical advance directive? Yes (Inpatient - patient defers creating a medical advance directive at this time) - - - No - patient declined information - No - patient declined information    Tobacco Social History   Tobacco Use  Smoking Status Former Smoker  . Packs/day: 1.00  . Years: 56.00  . Pack years: 56.00  . Types: Cigarettes  . Last attempt to quit: 06/22/2015  . Years since quitting: 2.9  Smokeless Tobacco Never Used     Counseling given: Not Answered   Clinical Intake:  Pre-visit preparation completed: Yes  Pain : No/denies pain Pain Score: 0-No pain     Nutritional Status: BMI 25 -29 Overweight Nutritional Risks: None Diabetes: No  How often do you need to have someone help you when you read instructions, pamphlets, or other written materials from your doctor or pharmacy?: 1 - Never What is the last grade level you completed in school?: 12th grade  Interpreter Needed?: No  Information entered by :: NAllen LPN  Past Medical History:  Diagnosis Date  . Arthritis   . Asthma   . COPD (chronic obstructive pulmonary disease) ([place])   . Depression   . GERD (gastroesophageal reflux  disease)   . Hyperlipidemia   . Hypertension 06/15/2016  . Thyroid nodule    Past Surgical History:  Procedure Laterality Date  . BIOPSY THYROID    . CATARACT EXTRACTION     left eye  . CATARACT EXTRACTION W/ INTRAOCULAR LENS IMPLANT     RIGHT  . CESAREAN SECTION    . EYE SURGERY    . MASS EXCISION N/A 12/13/2014   Procedure: EXCISION OF VOCAL CORD POLYPS;  Surgeon: Jerrell Belfast, MD;  Location: [facility];  Service: ENT;  Laterality: N/A;  . MICROLARYNGOSCOPY N/A 12/13/2014   Procedure: MICROLARYNGOSCOPY;  Surgeon: Jerrell Belfast, MD;  Location: [facility];  Service: ENT;  Laterality: N/A;  . TUBAL LIGATION    . VIDEO ASSISTED THORACOSCOPY (VATS)/ LOBECTOMY Left 07/04/2015   Procedure: VIDEO ASSISTED THORACOSCOPY (VATS) LEFT UPPER LOBECTOMY;  Surgeon: Melrose Nakayama, MD;  Location: [facility];  Service: Thoracic;  Laterality: Left;  Marland Kitchen VIDEO BRONCHOSCOPY WITH INSERTION OF INTERBRONCHIAL VALVE (IBV) N/A 07/11/2015   Procedure: VIDEO BRONCHOSCOPY WITH INSERTION OF INTERBRONCHIAL VALVE (IBV);  Surgeon: Melrose Nakayama, MD;  Location: [facility];  Service: Thoracic;  Laterality: N/A;  . VIDEO BRONCHOSCOPY WITH INSERTION OF INTERBRONCHIAL VALVE (IBV) N/A 09/10/2015   Procedure: VIDEO BRONCHOSCOPY WITH REMOVAL OF INTERBRONCHIAL VALVES;  Surgeon: Melrose Nakayama, MD;  Location: [facility];  Service: Thoracic;  Laterality: N/A;   Family History  Problem Relation Age of Onset  . Renal cancer Brother   . Diabetes Mother   . Hypertension Mother   . Renal Disease Mother    Social History   Socioeconomic History  . Marital status: Widowed    Spouse name: Not on file  . Number of children: 5  . Years of education: Not on file  . Highest education level: Not on file  Occupational History  . Occupation: retired  Scientific laboratory technician  . Financial resource strain: Not hard at all  . Food insecurity:    Worry: Never true    Inability: Never true  . Transportation needs:    Medical: No    Non-medical: No    Tobacco Use  . Smoking status: Former Smoker    Packs/day: 1.00    Years: 56.00    Pack years: 56.00    Types: Cigarettes    Last attempt to quit: 06/22/2015    Years since quitting: 2.9  . Smokeless tobacco: Never Used  Substance and Sexual Activity  . Alcohol use: No    Alcohol/week: 0.0 standard drinks  . Drug use: No  . Sexual activity: Not Currently  Lifestyle  . Physical activity:    Days per week: 0 days    Minutes per session: 0 min  . Stress: Not at all  Relationships  . Social connections:    Talks on phone: Not on file    Gets together: Not on file    Attends religious service: Not on file    Active member of club or organization: Not on file    Attends meetings of clubs or organizations: Not on file    Relationship status: Not on file  Other Topics Concern  . Not on file  Social History Narrative  . Not on file    Outpatient Encounter Medications as of 06/14/2018  Medication Sig  . aspirin 81 MG tablet Take 81 mg by mouth every evening.   Marland Kitchen co-enzyme Q-10 30 MG capsule Take 30 mg by mouth daily.  Marland Kitchen donepezil (ARICEPT) 10 MG tablet Take 10 mg by mouth every evening.  . ezetimibe (ZETIA) 10 MG tablet Take 10 mg by mouth at bedtime.   . Magnesium Oxide (MAG-200) 200 MG TABS Take 1 tablet by mouth daily.  . Melatonin 2.5 MG CAPS Take 1 capsule by mouth at bedtime as needed.  . mirtazapine (REMERON) 15 MG tablet Take 7.5 mg by mouth at bedtime.   . Multiple Vitamin (MULTIVITAMIN) tablet Take 1 tablet by mouth daily.  . niacin (NIASPAN) 500 MG CR tablet Take 500 mg by mouth every evening.   Marland Kitchen OVER THE COUNTER MEDICATION Take 1 tablet by mouth daily.  . pantoprazole (PROTONIX) 40 MG tablet Take 40 mg by mouth 2 (two) times daily.  . potassium chloride SA (K-DUR,KLOR-CON) 20 MEQ tablet Take 1 tablet (20 mEq total) by mouth 2 (two) times daily.  . valACYclovir (VALTREX) 500 MG tablet Take 500 mg by mouth daily.  . VENTOLIN HFA 108 (90 Base) MCG/ACT inhaler INHALE 2  PUFFS INTO THE LUNGS EVERY 6 HOURS AS NEEDED FOR WHEEZING OR SHORTNESS OF BREATH.  Dema Severin Petrolatum-Mineral Oil (SYSTANE NIGHTTIME OP) Place 1 drop into both eyes at bedtime as needed. For dry eyes  . [DISCONTINUED] cyclobenzaprine (FLEXERIL) 5 MG tablet Take 1 tablet (5 mg total) by mouth 3 (three) times daily as needed for muscle spasms.   No facility-administered encounter medications on file as of 06/14/2018.  Activities of Daily Living In your present state of health, do you have any difficulty performing the following activities: 06/14/2018  Hearing? N  Vision? N  Difficulty concentrating or making decisions? N  Walking or climbing stairs? Y  Comment Has COPD so gets winded before getting all the way to the top.  Dressing or bathing? N  Doing errands, shopping? N  Preparing Food and eating ? N  Using the Toilet? N  In the past six months, have you accidently leaked urine? Y  Comment Stress incontinence at times  Do you have problems with loss of bowel control? N  Managing your Medications? N  Housekeeping or managing your Housekeeping? N  Some recent data might be hidden    Patient Care Team: Glendale Chard, MD as PCP - General (Internal Medicine) Monna Fam, MD as Consulting Physician (Ophthalmology) Rigoberto Noel, MD as Consulting Physician (Pulmonary Disease) Melrose Nakayama, MD as Consulting Physician (Cardiothoracic Surgery) Curt Bears, MD as Consulting Physician (Oncology)    Assessment:   This is a routine wellness examination for [facility].  Exercise Activities and Dietary recommendations Current Exercise Habits: The patient does not participate in regular exercise at present, Exercise limited by: respiratory conditions(s)  Goals    . Have 2 meals a day (pt-stated)     Wants to eat more healthy. Has been nibbling through out the day. Has only been eating 1 meal a day twice or three times a week.       Fall Risk Fall Risk  06/14/2018  06/14/2018 06/04/2016 08/04/2015  Falls in the past year? No No No No  Risk for fall due to : Medication side effect - - -   Is the patient's home free of loose throw rugs in walkways, pet beds, electrical cords, etc?   yes      Grab bars in the bathroom? yes      Handrails on the stairs?   yes      Adequate lighting?   yes  Timed Get Up and Go performed: N/A  Depression Screen PHQ 2/9 Scores 06/14/2018 06/14/2018 06/04/2016 08/04/2015  PHQ - 2 Score 0 0 0 0  PHQ- 9 Score 6 - - -     Cognitive Function     6CIT Screen 06/14/2018  What Year? 0 points  What month? 0 points  What time? 0 points  Count back from 20 0 points  Months in reverse 0 points  Repeat phrase 0 points  Total Score 0    Immunization History  Administered Date(s) Administered  . Influenza, High Dose Seasonal PF 06/06/2016, 05/31/2018  . Influenza-Unspecified 05/01/2017  . Pneumococcal Conjugate-13 07/17/2014  . Pneumococcal Polysaccharide-23 09/07/2011    Qualifies for Shingles Vaccine? Declines   Screening Tests Health Maintenance  Topic Date Due  . URINE MICROALBUMIN  04/13/1951  . DEXA SCAN  04/12/2006  . TETANUS/TDAP  10/30/2022  . INFLUENZA VACCINE  Completed  . PNA vac Low Risk Adult  Completed    Cancer Screenings: Lung: Low Dose CT Chest recommended if Age 65-80 years, 30 pack-year currently smoking OR have quit w/in 15years. Patient does qualify. Breast:  Up to date on Mammogram? Yes   Up to date of Bone Density/Dexa? Yes Colorectal: no longer required  Additional Screenings: : Hepatitis C Screening: N/A     Plan:     I have personally reviewed and noted the following in the patient's chart:   . Medical and social history . Use of alcohol, tobacco  or illicit drugs  . Current medications and supplements . Functional ability and status . Nutritional status . Physical activity . Advanced directives . List of other physicians . Hospitalizations, surgeries, and ER visits in  previous 12 months . Vitals . Screenings to include cognitive, depression, and falls . Referrals and appointments  In addition, I have reviewed and discussed with patient certain preventive protocols, quality metrics, and best practice recommendations. A written personalized care plan for preventive services as well as general preventive health recommendations were provided to patient.     Kellie Simmering, LPN  88/01/276

## 2018-06-14 NOTE — Progress Notes (Signed)
cb Subjective:     Patient ID: [name] , female    DOB: 1941/02/23 , 77 y.o.   MRN: [id number]   Muscle cramping - she continues to have muscle cramping to her abdomen, hands and legs. Worse at night.   Hypertension  This is a chronic problem. The current episode started more than 1 year ago. The problem is controlled. Associated symptoms include shortness of breath. Pertinent negatives include no peripheral edema. There are no associated agents to hypertension. Risk factors for coronary artery disease include dyslipidemia, sedentary lifestyle and diabetes mellitus. There are no compliance problems.   Wheezing   This is a recurrent problem. The problem occurs intermittently. The problem has been waxing and waning. Associated symptoms include coughing and shortness of breath. Associated symptoms comments: Recently treated with prednisone by pulmonologist.     Past Medical History:  Diagnosis Date  . Arthritis   . Asthma   . COPD (chronic obstructive pulmonary disease) ([place])   . Depression   . GERD (gastroesophageal reflux disease)   . Hyperlipidemia   . Hypertension 06/15/2016  . Thyroid nodule       Current Outpatient Medications:  .  aspirin 81 MG tablet, Take 81 mg by mouth every evening. , Disp: , Rfl:  .  co-enzyme Q-10 30 MG capsule, Take 30 mg by mouth daily., Disp: , Rfl:  .  donepezil (ARICEPT) 10 MG tablet, Take 10 mg by mouth every evening., Disp: , Rfl: 2 .  ezetimibe (ZETIA) 10 MG tablet, Take 10 mg by mouth at bedtime. , Disp: , Rfl:  .  Magnesium Oxide (MAG-200) 200 MG TABS, Take 1 tablet by mouth daily., Disp: , Rfl:  .  Melatonin 2.5 MG CAPS, Take 1 capsule by mouth at bedtime as needed., Disp: , Rfl:  .  mirtazapine (REMERON) 15 MG tablet, Take 7.5 mg by mouth at bedtime. , Disp: , Rfl: 0 .  Multiple Vitamin (MULTIVITAMIN) tablet, Take 1 tablet by mouth daily., Disp: , Rfl:  .  niacin (NIASPAN) 500 MG CR tablet, Take 500 mg by mouth every evening. , Disp: ,  Rfl:  .  OVER THE COUNTER MEDICATION, Take 1 tablet by mouth daily., Disp: , Rfl:  .  pantoprazole (PROTONIX) 40 MG tablet, Take 40 mg by mouth 2 (two) times daily., Disp: , Rfl:  .  potassium chloride SA (K-DUR,KLOR-CON) 20 MEQ tablet, Take 1 tablet (20 mEq total) by mouth 2 (two) times daily., Disp: 14 tablet, Rfl: 0 .  valACYclovir (VALTREX) 500 MG tablet, Take 500 mg by mouth daily., Disp: , Rfl:  .  VENTOLIN HFA 108 (90 Base) MCG/ACT inhaler, INHALE 2 PUFFS INTO THE LUNGS EVERY 6 HOURS AS NEEDED FOR WHEEZING OR SHORTNESS OF BREATH., Disp: 18 Inhaler, Rfl: 2 .  White Petrolatum-Mineral Oil (SYSTANE NIGHTTIME OP), Place 1 drop into both eyes at bedtime as needed. For dry eyes, Disp: , Rfl:  .  cyclobenzaprine (FLEXERIL) 5 MG tablet, Take 1 tablet (5 mg total) by mouth 3 (three) times daily as needed for muscle spasms. (Patient not taking: Reported on 06/14/2018), Disp: 10 tablet, Rfl: 0   Review of Systems  Constitutional: Negative.   HENT: Negative.   Respiratory: Positive for cough, shortness of breath and wheezing.   Cardiovascular: Negative.   Musculoskeletal: Positive for myalgias.  Skin: Negative.      Today's Vitals   06/14/18 1424  BP: 130/78  Pulse: 84  Temp: 98.7 F (37.1 C)  TempSrc: Oral  SpO2: 94%  Weight: 158 lb 6.4 oz (71.8 kg)  Height: 5' 2.5" (1.588 m)   Body mass index is 28.51 kg/m.   Objective:  Physical Exam  Constitutional: She appears well-developed and well-nourished.  Cardiovascular: Normal rate, regular rhythm, normal heart sounds and intact distal pulses.  Pulmonary/Chest: Effort normal and breath sounds normal.  Slight dyspnea.  Skin: Skin is warm and dry.        Assessment And Plan:     1. Essential hypertension  Chronic, controlled  Continue with current medications  - CMP14 + Anion Gap - CBC no Diff - Lipid Profile  2. Type 2 diabetes mellitus without complication, without long-term current use of insulin (HCC)  Chronic,  controlled  Continue with current medications - Hemoglobin A1c  3. Mixed hyperlipidemia  Chronic, controlled  Continue with current medications - CMP14 + Anion Gap - Lipid Profile  4. Muscle cramps  Will check CPK if abnormal will consider referring to Neurology for possible muscle disease. - cyclobenzaprine (FLEXERIL) 5 MG tablet; Take 1 tablet (5 mg total) by mouth 3 (three) times daily as needed for muscle spasms.  Dispense: 20 tablet; Refill: 0 - CK, total       [name], FNP

## 2018-06-14 NOTE — Patient Instructions (Signed)
[name] , Thank you for taking time to come for your Medicare Wellness Visit. I appreciate your ongoing commitment to your health goals. Please review the following plan we discussed and let me know if I can assist you in the future.   Screening recommendations/referrals: Colonoscopy: not required Mammogram: up to date Bone Density: up to date Recommended yearly ophthalmology/optometry visit for glaucoma screening and checkup Recommended yearly dental visit for hygiene and checkup  Vaccinations: Influenza vaccine: 05/31/2018 Pneumococcal vaccine: 08/03/2011 Tdap vaccine: 10/30/2012 Shingles vaccine: decline    Advanced directives: Advance directive discussed with you today. Even though you declined this today please call our office should you change your mind and we can give you the proper paperwork for you to fill out.   Conditions/risks identified:Overweight: Wants to start eating regular meals  Next appointment:10/18/2018 at 2:30p   Preventive Care 65 Years and Older, Female Preventive care refers to lifestyle choices and visits with your health care provider that can promote health and wellness. What does preventive care include?  A yearly physical exam. This is also called an annual well check.  Dental exams once or twice a year.  Routine eye exams. Ask your health care provider how often you should have your eyes checked.  Personal lifestyle choices, including:  Daily care of your teeth and gums.  Regular physical activity.  Eating a healthy diet.  Avoiding tobacco and drug use.  Limiting alcohol use.  Practicing safe sex.  Taking low-dose aspirin every day.  Taking vitamin and mineral supplements as recommended by your health care provider. What happens during an annual well check? The services and screenings done by your health care provider during your annual well check will depend on your age, overall health, lifestyle risk factors, and family history  of disease. Counseling  Your health care provider may ask you questions about your:  Alcohol use.  Tobacco use.  Drug use.  Emotional well-being.  Home and relationship well-being.  Sexual activity.  Eating habits.  History of falls.  Memory and ability to understand (cognition).  Work and work Statistician.  Reproductive health. Screening  You may have the following tests or measurements:  Height, weight, and BMI.  Blood pressure.  Lipid and cholesterol levels. These may be checked every 5 years, or more frequently if you are over 77 years old.  Skin check.  Lung cancer screening. You may have this screening every year starting at age 51 if you have a 30-pack-year history of smoking and currently smoke or have quit within the past 15 years.  Fecal occult blood test (FOBT) of the stool. You may have this test every year starting at age 24.  Flexible sigmoidoscopy or colonoscopy. You may have a sigmoidoscopy every 5 years or a colonoscopy every 10 years starting at age 55.  Hepatitis C blood test.  Hepatitis B blood test.  Sexually transmitted disease (STD) testing.  Diabetes screening. This is done by checking your blood sugar (glucose) after you have not eaten for a while (fasting). You may have this done every 1-3 years.  Bone density scan. This is done to screen for osteoporosis. You may have this done starting at age 69.  Mammogram. This may be done every 1-2 years. Talk to your health care provider about how often you should have regular mammograms. Talk with your health care provider about your test results, treatment options, and if necessary, the need for more tests. Vaccines  Your health care provider may recommend certain vaccines,  such as:  Influenza vaccine. This is recommended every year.  Tetanus, diphtheria, and acellular pertussis (Tdap, Td) vaccine. You may need a Td booster every 10 years.  Zoster vaccine. You may need this after age  36.  Pneumococcal 13-valent conjugate (PCV13) vaccine. One dose is recommended after age 64.  Pneumococcal polysaccharide (PPSV23) vaccine. One dose is recommended after age 89. Talk to your health care provider about which screenings and vaccines you need and how often you need them. This information is not intended to replace advice given to you by your health care provider. Make sure you discuss any questions you have with your health care provider. Document Released: 09/19/2015 Document Revised: 05/12/2016 Document Reviewed: 06/24/2015 Elsevier Interactive Patient Education  2017 [employer] Prevention in the Home Falls can cause injuries. They can happen to people of all ages. There are many things you can do to make your home safe and to help prevent falls. What can I do on the outside of my home?  Regularly fix the edges of walkways and driveways and fix any cracks.  Remove anything that might make you trip as you walk through a door, such as a raised step or threshold.  Trim any bushes or trees on the path to your home.  Use bright outdoor lighting.  Clear any walking paths of anything that might make someone trip, such as rocks or tools.  Regularly check to see if handrails are loose or broken. Make sure that both sides of any steps have handrails.  Any raised decks and porches should have guardrails on the edges.  Have any leaves, snow, or ice cleared regularly.  Use sand or salt on walking paths during winter.  Clean up any spills in your garage right away. This includes oil or grease spills. What can I do in the bathroom?  Use night lights.  Install grab bars by the toilet and in the tub and shower. Do not use towel bars as grab bars.  Use non-skid mats or decals in the tub or shower.  If you need to sit down in the shower, use a plastic, non-slip stool.  Keep the floor dry. Clean up any water that spills on the floor as soon as it happens.  Remove  soap buildup in the tub or shower regularly.  Attach bath mats securely with double-sided non-slip rug tape.  Do not have throw rugs and other things on the floor that can make you trip. What can I do in the bedroom?  Use night lights.  Make sure that you have a light by your bed that is easy to reach.  Do not use any sheets or blankets that are too big for your bed. They should not hang down onto the floor.  Have a firm chair that has side arms. You can use this for support while you get dressed.  Do not have throw rugs and other things on the floor that can make you trip. What can I do in the kitchen?  Clean up any spills right away.  Avoid walking on wet floors.  Keep items that you use a lot in easy-to-reach places.  If you need to reach something above you, use a strong step stool that has a grab bar.  Keep electrical cords out of the way.  Do not use floor polish or wax that makes floors slippery. If you must use wax, use non-skid floor wax.  Do not have throw rugs and other things on  the floor that can make you trip. What can I do with my stairs?  Do not leave any items on the stairs.  Make sure that there are handrails on both sides of the stairs and use them. Fix handrails that are broken or loose. Make sure that handrails are as long as the stairways.  Check any carpeting to make sure that it is firmly attached to the stairs. Fix any carpet that is loose or worn.  Avoid having throw rugs at the top or bottom of the stairs. If you do have throw rugs, attach them to the floor with carpet tape.  Make sure that you have a light switch at the top of the stairs and the bottom of the stairs. If you do not have them, ask someone to add them for you. What else can I do to help prevent falls?  Wear shoes that:  Do not have high heels.  Have rubber bottoms.  Are comfortable and fit you well.  Are closed at the toe. Do not wear sandals.  If you use a  stepladder:  Make sure that it is fully opened. Do not climb a closed stepladder.  Make sure that both sides of the stepladder are locked into place.  Ask someone to hold it for you, if possible.  Clearly mark and make sure that you can see:  Any grab bars or handrails.  First and last steps.  Where the edge of each step is.  Use tools that help you move around (mobility aids) if they are needed. These include:  Canes.  Walkers.  Scooters.  Crutches.  Turn on the lights when you go into a dark area. Replace any light bulbs as soon as they burn out.  Set up your furniture so you have a clear path. Avoid moving your furniture around.  If any of your floors are uneven, fix them.  If there are any pets around you, be aware of where they are.  Review your medicines with your doctor. Some medicines can make you feel dizzy. This can increase your chance of falling. Ask your doctor what other things that you can do to help prevent falls. This information is not intended to replace advice given to you by your health care provider. Make sure you discuss any questions you have with your health care provider. Document Released: 06/19/2009 Document Revised: 01/29/2016 Document Reviewed: 09/27/2014 Elsevier Interactive Patient Education  2017 [employer].

## 2018-06-15 LAB — LIPID PANEL
Chol/HDL Ratio: 4.7 ratio — ABNORMAL HIGH (ref 0.0–4.4)
Cholesterol, Total: 205 mg/dL — ABNORMAL HIGH (ref 100–199)
HDL: 44 mg/dL (ref >39)
LDL CALC: 118 mg/dL — AB (ref 0–99)
TRIGLYCERIDES: 213 mg/dL — AB (ref 0–149)
VLDL CHOLESTEROL CAL: 43 mg/dL — AB (ref 5–40)

## 2018-06-15 LAB — CMP14 + ANION GAP
A/G RATIO: 1.5 (ref 1.2–2.2)
ALT: 24 IU/L (ref 0–32)
ANION GAP: 15 mmol/L (ref 10.0–18.0)
AST: 20 IU/L (ref 0–40)
Albumin: 4.1 g/dL (ref 3.5–4.8)
Alkaline Phosphatase: 83 IU/L (ref 39–117)
BUN/Creatinine Ratio: 13 (ref 12–28)
BUN: 14 mg/dL (ref 8–27)
Bilirubin Total: 1 mg/dL (ref 0.0–1.2)
CALCIUM: 9.8 mg/dL (ref 8.7–10.3)
CO2: 27 mmol/L (ref 20–29)
CREATININE: 1.06 mg/dL — AB (ref 0.57–1.00)
Chloride: 102 mmol/L (ref 96–106)
GFR, EST AFRICAN AMERICAN: 59 mL/min/{1.73_m2} — AB (ref >59)
GFR, EST NON AFRICAN AMERICAN: 51 mL/min/{1.73_m2} — AB (ref >59)
GLUCOSE: 93 mg/dL (ref 65–99)
Globulin, Total: 2.7 g/dL (ref 1.5–4.5)
POTASSIUM: 4 mmol/L (ref 3.5–5.2)
Sodium: 144 mmol/L (ref 134–144)
Total Protein: 6.8 g/dL (ref 6.0–8.5)

## 2018-06-15 LAB — CBC
HEMATOCRIT: 44 % (ref 34.0–46.6)
HEMOGLOBIN: 14.6 g/dL (ref 11.1–15.9)
MCH: 31.6 pg (ref 26.6–33.0)
MCHC: 33.2 g/dL (ref 31.5–35.7)
MCV: 95 fL (ref 79–97)
Platelets: 231 10*3/uL (ref 150–450)
RBC: 4.62 x10E6/uL (ref 3.77–5.28)
RDW: 14.1 % (ref 12.3–15.4)
WBC: 9.3 10*3/uL (ref 3.4–10.8)

## 2018-06-15 LAB — CK: Total CK: 190 U/L — ABNORMAL HIGH (ref 24–173)

## 2018-06-15 LAB — HEMOGLOBIN A1C
Est. average glucose Bld gHb Est-mCnc: 137 mg/dL
Hgb A1c MFr Bld: 6.4 % — ABNORMAL HIGH (ref 4.8–5.6)

## 2018-06-20 ENCOUNTER — Ambulatory Visit
Admission: RE | Admit: 2018-06-20 | Discharge: 2018-06-20 | Disposition: A | Discharge status: Home / Self Care | Payer: Medicare HMO | Source: Ambulatory Visit | Attending: Thoracic Surgery (Cardiothoracic Vascular Surgery) | Admitting: Thoracic Surgery (Cardiothoracic Vascular Surgery)

## 2018-06-20 ENCOUNTER — Other Ambulatory Visit: Payer: Self-pay | Admitting: Nurse Practitioner

## 2018-06-20 ENCOUNTER — Ambulatory Visit (INDEPENDENT_AMBULATORY_CARE_PROVIDER_SITE_OTHER): Payer: Medicare HMO | Admitting: Thoracic Surgery (Cardiothoracic Vascular Surgery)

## 2018-06-20 ENCOUNTER — Encounter: Payer: Self-pay | Admitting: Thoracic Surgery (Cardiothoracic Vascular Surgery)

## 2018-06-20 ENCOUNTER — Other Ambulatory Visit: Payer: Self-pay

## 2018-06-20 ENCOUNTER — Other Ambulatory Visit: Payer: Self-pay | Admitting: *Deleted

## 2018-06-20 VITALS — BP 110/70 | HR 64 | Resp 18 | Ht 62.5 in | Wt 157.8 lb

## 2018-06-20 DIAGNOSIS — C3492 Malignant neoplasm of unspecified part of left bronchus or lung: Secondary | ICD-10-CM

## 2018-06-20 DIAGNOSIS — Z85118 Personal history of other malignant neoplasm of bronchus and lung: Secondary | ICD-10-CM

## 2018-06-20 DIAGNOSIS — R05 Cough: Secondary | ICD-10-CM | POA: Diagnosis not present

## 2018-06-20 NOTE — Progress Notes (Signed)
[address]       [place] [address]             [phone]       HPI: [name] returns for a scheduled annual follow-up  [name] is a 77 year old woman who had a thoracoscopic left upper lobectomy for stage Ib adenocarcinoma in October 2016.  She had a prolonged air leak postoperatively.  That eventually resolved after placement of endobronchial valves.  She did not require any adjuvant therapy and has been followed since that time with no evidence of recurrent disease.  She saw Dr. Julien Nordmann in April.  Her CT at that time showed no evidence of recurrence.  She says she feels "alright."  She had a rough weekend after having a pneumonia shot.  She had some fevers and general malaise.  That has resolved.  She says her appetite is variable and she is lost about 3 pounds over the past 6 months.  She says her breathing varies from day-to-day.  Past Medical History:  Diagnosis Date  . Arthritis   . Asthma   . COPD (chronic obstructive pulmonary disease) ([place])   . Depression   . GERD (gastroesophageal reflux disease)   . Hyperlipidemia   . Hypertension 06/15/2016  . Thyroid nodule     Current Outpatient Medications  Medication Sig Dispense Refill  . aspirin 81 MG tablet Take 81 mg by mouth every evening.     Marland Kitchen co-enzyme Q-10 30 MG capsule Take 30 mg by mouth daily.    . cyclobenzaprine (FLEXERIL) 5 MG tablet Take 1 tablet (5 mg total) by mouth 3 (three) times daily as needed for muscle spasms. 20 tablet 0  . donepezil (ARICEPT) 10 MG tablet Take 10 mg by mouth every evening.  2  . ezetimibe (ZETIA) 10 MG tablet Take 10 mg by mouth at bedtime.     . Magnesium Oxide (MAG-200) 200 MG TABS Take 1 tablet by mouth daily.    . Melatonin 2.5 MG CAPS Take 1 capsule by mouth at bedtime as needed.    . mirtazapine (REMERON) 15 MG tablet Take 7.5 mg by mouth at bedtime.   0  . Multiple Vitamin (MULTIVITAMIN) tablet Take 1 tablet by mouth daily.    . niacin  (NIASPAN) 500 MG CR tablet Take 500 mg by mouth every evening.     Marland Kitchen OVER THE COUNTER MEDICATION Take 1 tablet by mouth daily.    . pantoprazole (PROTONIX) 40 MG tablet Take 40 mg by mouth 2 (two) times daily.    . potassium chloride SA (K-DUR,KLOR-CON) 20 MEQ tablet Take 1 tablet (20 mEq total) by mouth 2 (two) times daily. 14 tablet 0  . valACYclovir (VALTREX) 500 MG tablet Take 500 mg by mouth daily.    . VENTOLIN HFA 108 (90 Base) MCG/ACT inhaler INHALE 2 PUFFS INTO THE LUNGS EVERY 6 HOURS AS NEEDED FOR WHEEZING OR SHORTNESS OF BREATH. 18 Inhaler 2  . White Petrolatum-Mineral Oil (SYSTANE NIGHTTIME OP) Place 1 drop into both eyes at bedtime as needed. For dry eyes     No current facility-administered medications for this visit.     Physical Exam BP 110/70 (BP Location: Right Arm, Patient Position: Sitting, Cuff Size: Normal)   Pulse 64   Resp 18   Ht 5' 2.5" (1.588 m)   Wt 157 lb 12.8 oz (71.6 kg)   SpO2 94% Comment: RA  BMI 28.32 kg/m  77 year old woman in no  acute distress Alert and oriented x3 with no focal deficits Lungs diminished at left base, otherwise clear Cardiac regular rate and rhythm normal S1-S2 No cervical or subclavicular adenopathy  Diagnostic Tests: Chest x-ray pending  Impression: [name] is a 77 year old former smoker who had a thoracoscopic left upper lobectomy for a stage IB non-small cell carcinoma in October 2016.  She is now 3 years out from surgery with no evidence of recurrent disease.  She will see Dr. Julien Nordmann again in April and is having annual CT scans done by him.  I am going to get a chest x-ray on her today just to make sure there are not any major issues.  Overall she seems to be doing reasonably well.  Plan: We will up as scheduled with Dr. Julien Nordmann  Return in 1 year with PA and lateral chest  Melrose Nakayama, MD [facility] [phone]

## 2018-07-05 DIAGNOSIS — Z803 Family history of malignant neoplasm of breast: Secondary | ICD-10-CM | POA: Diagnosis not present

## 2018-07-05 DIAGNOSIS — Z1231 Encounter for screening mammogram for malignant neoplasm of breast: Secondary | ICD-10-CM | POA: Diagnosis not present

## 2018-08-13 ENCOUNTER — Other Ambulatory Visit: Payer: Self-pay | Admitting: Nurse Practitioner

## 2018-08-18 ENCOUNTER — Emergency Department (HOSPITAL_COMMUNITY): Payer: Medicare HMO

## 2018-08-18 ENCOUNTER — Emergency Department (HOSPITAL_COMMUNITY)
Admission: EM | Admit: 2018-08-18 | Discharge: 2018-08-18 | Disposition: A | Discharge status: Home / Self Care | Payer: Medicare HMO | Attending: Emergency Medicine | Admitting: Emergency Medicine

## 2018-08-18 DIAGNOSIS — Z7982 Long term (current) use of aspirin: Secondary | ICD-10-CM | POA: Insufficient documentation

## 2018-08-18 DIAGNOSIS — I10 Essential (primary) hypertension: Secondary | ICD-10-CM | POA: Diagnosis not present

## 2018-08-18 DIAGNOSIS — J449 Chronic obstructive pulmonary disease, unspecified: Secondary | ICD-10-CM | POA: Diagnosis not present

## 2018-08-18 DIAGNOSIS — Z87891 Personal history of nicotine dependence: Secondary | ICD-10-CM | POA: Diagnosis not present

## 2018-08-18 DIAGNOSIS — R05 Cough: Secondary | ICD-10-CM | POA: Diagnosis not present

## 2018-08-18 DIAGNOSIS — J069 Acute upper respiratory infection, unspecified: Secondary | ICD-10-CM | POA: Diagnosis not present

## 2018-08-18 DIAGNOSIS — Z79899 Other long term (current) drug therapy: Secondary | ICD-10-CM | POA: Diagnosis not present

## 2018-08-18 MED ORDER — AZITHROMYCIN 250 MG PO TABS: 250.0000 mg | ORAL_TABLET | Freq: Every day | ORAL | 0 refills | Status: DC

## 2018-08-18 NOTE — ED Triage Notes (Signed)
Pt to ER for evaluation of cough and congestion x1 week, reports it was getting better then got worse again. Pt in NAD. A/o x4.

## 2018-08-18 NOTE — Discharge Instructions (Addendum)
You were evaluated in the emergency department for symptoms of an upper respiratory infection.  Although your chest x-ray did not show an obvious pneumonia we will start you on some antibiotics.  Please continue to drink plenty of fluids and use your Mucinex.  Follow-up with your doctor and return if any worsening symptoms.

## 2018-08-18 NOTE — ED Provider Notes (Signed)
[facility] EMERGENCY DEPARTMENT Provider Note   CSN: [id number] Arrival date & time: 08/18/18  1253     History   Chief Complaint Chief Complaint  Patient presents with  . Cough    HPI [name] is a 77 y.o. female.  She is presenting with head congestion runny nose sneezing sore throat and cough that been going on for 5 days.  It sounds like she talked to her PCP and has been taking Mucinex and an antihistamine.  She thought it was getting better but since last night her cough is worse and she has been coughing up a little bit of blood.  She had a fever up to 100.2 yesterday.  No chest pain no shortness of breath no abdominal pain no vomiting or diarrhea.  No urinary symptoms.  The history is provided by the patient.  Cough  This is a new problem. The current episode started more than 2 days ago. The problem occurs every few minutes. The problem has been gradually worsening. The cough is productive of blood-tinged sputum. The maximum temperature recorded prior to her arrival was 100 to 100.9 F. The fever has been present for less than 1 day. Associated symptoms include headaches, rhinorrhea and sore throat. Pertinent negatives include no chest pain, no chills, no sweats, no ear congestion, no ear pain, no myalgias, no shortness of breath, no wheezing and no eye redness. She has tried cough syrup for the symptoms. The treatment provided mild relief. She is not a smoker.    Past Medical History:  Diagnosis Date  . Arthritis   . Asthma   . COPD (chronic obstructive pulmonary disease) ([place])   . Depression   . GERD (gastroesophageal reflux disease)   . Hyperlipidemia   . Hypertension 06/15/2016  . Thyroid nodule     Patient Active Problem List   Diagnosis Date Noted  . Prediabetes 05/27/2018  . Hypertension 06/15/2016  . Adenocarcinoma of left lung ([place]) 07/04/2015  . 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 Multinodular goiter (nontoxic)  07/18/2014  . Malnutrition of moderate degree ([place]) 05/24/2014  . Dyspnea 05/23/2014  . Other and unspecified hyperlipidemia 05/23/2014  . Rhinitis, allergic 11/22/2013  . COPD (chronic obstructive pulmonary disease) ([place]) 12/15/2012    Past Surgical History:  Procedure Laterality Date  . BIOPSY THYROID    . CATARACT EXTRACTION     left eye  . CATARACT EXTRACTION W/ INTRAOCULAR LENS IMPLANT     RIGHT  . CESAREAN SECTION    . EYE SURGERY    . MASS EXCISION N/A 12/13/2014   Procedure: EXCISION OF VOCAL CORD POLYPS;  Surgeon: Jerrell Belfast, MD;  Location: [facility];  Service: ENT;  Laterality: N/A;  . MICROLARYNGOSCOPY N/A 12/13/2014   Procedure: MICROLARYNGOSCOPY;  Surgeon: Jerrell Belfast, MD;  Location: [facility];  Service: ENT;  Laterality: N/A;  . TUBAL LIGATION    . VIDEO ASSISTED THORACOSCOPY (VATS)/ LOBECTOMY Left 07/04/2015   Procedure: VIDEO ASSISTED THORACOSCOPY (VATS) LEFT UPPER LOBECTOMY;  Surgeon: Melrose Nakayama, MD;  Location: [facility];  Service: Thoracic;  Laterality: Left;  Marland Kitchen VIDEO BRONCHOSCOPY WITH INSERTION OF INTERBRONCHIAL VALVE (IBV) N/A 07/11/2015   Procedure: VIDEO BRONCHOSCOPY WITH INSERTION OF INTERBRONCHIAL VALVE (IBV);  Surgeon: Melrose Nakayama, MD;  [facility];  Service: Thoracic;  Laterality: N/A;  . VIDEO BRONCHOSCOPY WITH INSERTION OF INTERBRONCHIAL VALVE (IBV) N/A 09/10/2015   Procedure: VIDEO BRONCHOSCOPY WITH REMOVAL OF INTERBRONCHIAL VALVES;  Surgeon: Remo Lipps  Chaya Jan, MD;  Location: [facility];  Service: Thoracic;  Laterality: N/A;     OB History   No obstetric history on file.      Home Medications    Prior to Admission medications   Medication Sig Start Date End Date Taking? Authorizing Provider  aspirin 81 MG tablet Take 81 mg by mouth every evening.     [provider]  co-enzyme Q-10 30 MG capsule Take 30 mg by mouth daily.    [provider]  cyclobenzaprine (FLEXERIL) 5 MG tablet Take 1 tablet (5 mg total) by mouth 3  (three) times daily as needed for muscle spasms. 06/14/18   [name], FNP  donepezil (ARICEPT) 10 MG tablet TAKE 1 TABLET BY MOUTH EVERY EVENING 06/20/18   [name], FNP  ezetimibe (ZETIA) 10 MG tablet Take 10 mg by mouth at bedtime.     [provider]  Magnesium Oxide (MAG-200) 200 MG TABS Take 1 tablet by mouth daily.    [provider]  Melatonin 2.5 MG CAPS Take 1 capsule by mouth at bedtime as needed.    [provider]  mirtazapine (REMERON) 15 MG tablet Take 7.5 mg by mouth at bedtime.  08/08/14   [provider]  Multiple Vitamin (MULTIVITAMIN) tablet Take 1 tablet by mouth daily.    [provider]  niacin (NIASPAN) 500 MG CR tablet Take 500 mg by mouth every evening.     [provider]  OVER THE COUNTER MEDICATION Take 1 tablet by mouth daily.    [provider]  pantoprazole (PROTONIX) 40 MG tablet Take 40 mg by mouth 2 (two) times daily.    [provider]  potassium chloride SA (K-DUR,KLOR-CON) 20 MEQ tablet Take 1 tablet (20 mEq total) by mouth 2 (two) times daily. 06/11/16   Curt Bears, MD  valACYclovir (VALTREX) 500 MG tablet Take 500 mg by mouth daily.    [provider]  VENTOLIN HFA 108 (90 Base) MCG/ACT inhaler INHALE 2 PUFFS INTO THE LUNGS EVERY 6 HOURS AS NEEDED FOR WHEEZING OR SHORTNESS OF BREATH. 01/31/18   Rigoberto Noel, MD  White Petrolatum-Mineral Oil (SYSTANE NIGHTTIME OP) Place 1 drop into both eyes at bedtime as needed. For dry eyes    [provider]    Family History Family History  Problem Relation Age of Onset  . Renal cancer Brother   . Diabetes Mother   . Hypertension Mother   . Renal Disease Mother     Social History Social History   Tobacco Use  . Smoking status: Former Smoker    Packs/day: 1.00    Years: 56.00    Pack years: 56.00    Types: Cigarettes    Last attempt to quit: 06/22/2015    Years since quitting: 3.1  . Smokeless tobacco:  Never Used  Substance Use Topics  . Alcohol use: No    Alcohol/week: 0.0 standard drinks  . Drug use: No     Allergies   Patient has no known allergies.   Review of Systems Review of Systems  Constitutional: Negative for chills and fever.  HENT: Positive for rhinorrhea and sore throat. Negative for ear pain.   Eyes: Negative for redness.  Respiratory: Positive for cough. Negative for shortness of breath and wheezing.   Cardiovascular: Negative for chest pain.  Gastrointestinal: Negative for abdominal pain.  Genitourinary: Negative for dysuria.  Musculoskeletal: Negative for myalgias.  Skin: Negative for rash.  Neurological: Positive for headaches.  Physical Exam Updated Vital Signs BP (!) 145/100 (BP Location: Right Arm)   Pulse (!) 104   Temp 99.1 F (37.3 C) (Oral)   Resp 20   Ht 5\' 3"  (1.6 m)   Wt 67.1 kg   SpO2 97%   BMI 26.22 kg/m   Physical Exam Vitals signs and nursing note reviewed.  Constitutional:      General: She is not in acute distress.    Appearance: She is well-developed.  HENT:     Head: Normocephalic and atraumatic.     Right Ear: Tympanic membrane normal.     Left Ear: Tympanic membrane normal.     Nose: Nose normal.     Mouth/Throat:     Mouth: Mucous membranes are moist.     Pharynx: Oropharynx is clear. Posterior oropharyngeal erythema present. No oropharyngeal exudate.  Eyes:     Extraocular Movements: Extraocular movements intact.     Conjunctiva/sclera: Conjunctivae normal.  Neck:     Musculoskeletal: Normal range of motion and neck supple.  Cardiovascular:     Rate and Rhythm: Normal rate and regular rhythm.     Pulses: Normal pulses.     Heart sounds: No murmur.  Pulmonary:     Effort: Pulmonary effort is normal. No respiratory distress.     Breath sounds: Normal breath sounds.  Abdominal:     Palpations: Abdomen is soft.     Tenderness: There is no abdominal tenderness.  Musculoskeletal: Normal range of motion.         General: No swelling or tenderness.  Skin:    General: Skin is warm and dry.     Capillary Refill: Capillary refill takes less than 2 seconds.  Neurological:     General: No focal deficit present.     Mental Status: She is alert.     Gait: Gait normal.  Psychiatric:        Mood and Affect: Mood normal.      ED Treatments / Results  Labs (all labs ordered are listed, but only abnormal results are displayed) Labs Reviewed - No data to display  EKG None  Radiology Dg Chest 2 View  Result Date: 08/18/2018 CLINICAL DATA:  Cough, congestion, and fever for the past 5 days. EXAM: CHEST - 2 VIEW COMPARISON:  Chest x-ray dated June 20, 2018. FINDINGS: The heart size and mediastinal contours are within normal limits. Normal pulmonary vascularity. The lungs remain emphysematous. No focal consolidation, pleural effusion, or pneumothorax. No acute osseous abnormality. IMPRESSION: 1.  No active cardiopulmonary disease. 2. COPD. Electronically Signed   By: Titus Dubin M.D.   On: 08/18/2018 13:40    Procedures Procedures (including critical care time)  Medications Ordered in ED Medications - No data to display   Initial Impression / Assessment and Plan / ED Course  I have reviewed the triage vital signs and the nursing notes.  Pertinent labs & imaging results that were available during my care of the patient were reviewed by me and considered in my medical decision making (see chart for details).    Reviewed findings with patient.She would like to start on abx as sx are worseing.   Final Clinical Impressions(s) / ED Diagnoses   Final diagnoses:  Acute upper respiratory infection    ED Discharge Orders         Ordered    azithromycin (ZITHROMAX) 250 MG tablet  Daily     08/18/18 1429  Hayden Rasmussen, MD 08/19/18 [phone]

## 2018-08-21 ENCOUNTER — Other Ambulatory Visit: Payer: Self-pay | Admitting: Nurse Practitioner

## 2018-08-24 ENCOUNTER — Encounter: Payer: Self-pay | Admitting: Nurse Practitioner

## 2018-08-24 ENCOUNTER — Ambulatory Visit (INDEPENDENT_AMBULATORY_CARE_PROVIDER_SITE_OTHER): Payer: Medicare HMO | Admitting: Nurse Practitioner

## 2018-08-24 VITALS — BP 130/80 | HR 73 | Temp 98.2°F | Ht 63.25 in | Wt 157.4 lb

## 2018-08-24 DIAGNOSIS — Z09 Encounter for follow-up examination after completed treatment for conditions other than malignant neoplasm: Secondary | ICD-10-CM | POA: Diagnosis not present

## 2018-08-24 DIAGNOSIS — J209 Acute bronchitis, unspecified: Secondary | ICD-10-CM | POA: Insufficient documentation

## 2018-08-24 DIAGNOSIS — G4700 Insomnia, unspecified: Secondary | ICD-10-CM

## 2018-08-24 MED ORDER — MIRTAZAPINE 15 MG PO TABS: 15.0000 mg | ORAL_TABLET | Freq: Every day | ORAL | 5 refills | Status: DC

## 2018-08-24 MED ORDER — PREDNISONE 10 MG (21) PO TBPK: ORAL_TABLET | ORAL | 0 refills | Status: DC

## 2018-08-24 NOTE — Progress Notes (Addendum)
Subjective:     Patient ID: [name] , female    DOB: [birth year] , 77 y.o.   MRN: [id number]   Chief Complaint  Patient presents with  . ER F/U    Patient states she is feeling better but she still has a nagging cough    HPI  Cough will keep her up at night once she starts coughing.  URI   This is a recurrent problem. The current episode started 1 to 4 weeks ago. The problem has been gradually improving. There has been no fever. Associated symptoms include coughing. Pertinent negatives include no abdominal pain, congestion, headaches, sneezing or sore throat. She has tried nothing for the symptoms. The treatment provided moderate relief.  Cough  This is a recurrent problem. The current episode started 1 to 4 weeks ago. The problem has been gradually worsening. The problem occurs constantly. The cough is productive of sputum. Pertinent negatives include no fever, headaches or sore throat. Nothing aggravates the symptoms. The treatment provided no relief. Her past medical history is significant for COPD and emphysema. There is no history of asthma or pneumonia.  Insomnia  Primary symptoms: no fragmented sleep.  The current episode started more than one year. The onset quality is gradual. The problem occurs nightly. How many beverages per day that contain caffeine: 0 - 1.  PMH includes: associated symptoms present. Prior diagnostic workup includes:  No prior workup.     Past Medical History:  Diagnosis Date  . Arthritis   . Asthma   . COPD (chronic obstructive pulmonary disease) ([place])   . Depression   . GERD (gastroesophageal reflux disease)   . Hyperlipidemia   . Hypertension 06/15/2016  . Thyroid nodule      Family History  Problem Relation Age of Onset  . Renal cancer Brother   . Diabetes Mother   . Hypertension Mother   . Renal Disease Mother      Current Outpatient Medications:  .  aspirin 81 MG tablet, Take 81 mg by mouth every evening. , Disp: , Rfl:  .   co-enzyme Q-10 30 MG capsule, Take 30 mg by mouth daily., Disp: , Rfl:  .  cyclobenzaprine (FLEXERIL) 5 MG tablet, Take 1 tablet (5 mg total) by mouth 3 (three) times daily as needed for muscle spasms., Disp: 20 tablet, Rfl: 0 .  donepezil (ARICEPT) 10 MG tablet, TAKE 1 TABLET BY MOUTH EVERY EVENING, Disp: 90 tablet, Rfl: 1 .  ezetimibe (ZETIA) 10 MG tablet, Take 10 mg by mouth at bedtime. , Disp: , Rfl:  .  Magnesium Oxide (MAG-200) 200 MG TABS, Take 1 tablet by mouth daily., Disp: , Rfl:  .  Melatonin 2.5 MG CAPS, Take 1 capsule by mouth at bedtime as needed., Disp: , Rfl:  .  mirtazapine (REMERON) 15 MG tablet, Take 7.5 mg by mouth at bedtime. , Disp: , Rfl: 0 .  Multiple Vitamin (MULTIVITAMIN) tablet, Take 1 tablet by mouth daily., Disp: , Rfl:  .  niacin (NIASPAN) 500 MG CR tablet, Take 500 mg by mouth every evening. , Disp: , Rfl:  .  OVER THE COUNTER MEDICATION, Take 1 tablet by mouth daily., Disp: , Rfl:  .  pantoprazole (PROTONIX) 40 MG tablet, Take 40 mg by mouth 2 (two) times daily., Disp: , Rfl:  .  valACYclovir (VALTREX) 500 MG tablet, TAKE 1 TABLET BY MOUTH EVERY DAY, Disp: 30 tablet, Rfl: 0 .  VENTOLIN HFA 108 (90 Base) MCG/ACT inhaler, INHALE 2 PUFFS  INTO THE LUNGS EVERY 6 HOURS AS NEEDED FOR WHEEZING OR SHORTNESS OF BREATH., Disp: 18 Inhaler, Rfl: 2 .  White Petrolatum-Mineral Oil (SYSTANE NIGHTTIME OP), Place 1 drop into both eyes at bedtime as needed. For dry eyes, Disp: , Rfl:    No Known Allergies   Review of Systems  Constitutional: Negative for fever.  HENT: Negative.  Negative for congestion, sinus pressure, sneezing and sore throat.   Respiratory: Positive for cough.   Cardiovascular: Negative.   Gastrointestinal: Negative for abdominal pain.  Neurological: Negative for dizziness and headaches.  Hematological: Negative.   Psychiatric/Behavioral: The patient has insomnia.      Today's Vitals   08/24/18 1208  BP: 130/80  Pulse: 73  Temp: 98.2 F (36.8 C)   TempSrc: Oral  SpO2: 92%  Weight: 157 lb 6.4 oz (71.4 kg)  Height: 5' 3.25" (1.607 m)  PainSc: 0-No pain   Body mass index is 27.66 kg/m.   Objective:  Physical Exam Vitals signs reviewed.  Constitutional:      Appearance: Normal appearance.  Cardiovascular:     Rate and Rhythm: Normal rate and regular rhythm.     Heart sounds: Normal heart sounds. No murmur.  Pulmonary:     Effort: Pulmonary effort is normal. No respiratory distress.     Breath sounds: Examination of the right-upper field reveals decreased breath sounds. Examination of the left-upper field reveals decreased breath sounds. Examination of the right-lower field reveals decreased breath sounds. Examination of the left-lower field reveals decreased breath sounds. Decreased breath sounds present.     Comments: Cough present Skin:    Capillary Refill: Capillary refill takes less than 2 seconds.  Neurological:     General: No focal deficit present.     Mental Status: She is alert and oriented to person, place, and time.         Assessment And Plan:     1. Acute bronchitis, unspecified organism  Seen in ER about 2 weeks ago for treatment of URI, continues to have a hacking cough will treat with prednisone taper.  Continues to have decreased breath sounds throughout - predniSONE (STERAPRED UNI-PAK 21 TAB) 10 MG (21) TBPK tablet; Take as directed  Dispense: 21 tablet; Refill: 0   2. Insomnia, unspecified type  This is the first time she has mentioned difficulty with sleeping  She is taking melatonin with mild relief  Encouraged to ensure good sleep hygeine and to limit naps during the day   [name], FNP

## 2018-09-03 ENCOUNTER — Encounter: Payer: Self-pay | Admitting: Nurse Practitioner

## 2018-09-14 ENCOUNTER — Other Ambulatory Visit: Payer: Self-pay | Admitting: Nurse Practitioner

## 2018-09-15 ENCOUNTER — Other Ambulatory Visit: Payer: Self-pay | Admitting: Nurse Practitioner

## 2018-09-15 DIAGNOSIS — G4700 Insomnia, unspecified: Secondary | ICD-10-CM

## 2018-09-25 ENCOUNTER — Other Ambulatory Visit: Payer: Self-pay

## 2018-09-25 ENCOUNTER — Emergency Department (HOSPITAL_COMMUNITY): Payer: Medicare HMO

## 2018-09-25 ENCOUNTER — Encounter (HOSPITAL_COMMUNITY): Payer: Self-pay | Admitting: Emergency Medicine

## 2018-09-25 ENCOUNTER — Emergency Department (HOSPITAL_COMMUNITY)
Admission: EM | Admit: 2018-09-25 | Discharge: 2018-09-25 | Disposition: A | Discharge status: Home / Self Care | Payer: Medicare HMO | Attending: Emergency Medicine | Admitting: Emergency Medicine

## 2018-09-25 DIAGNOSIS — Z79899 Other long term (current) drug therapy: Secondary | ICD-10-CM | POA: Insufficient documentation

## 2018-09-25 DIAGNOSIS — I10 Essential (primary) hypertension: Secondary | ICD-10-CM | POA: Insufficient documentation

## 2018-09-25 DIAGNOSIS — Z87891 Personal history of nicotine dependence: Secondary | ICD-10-CM | POA: Diagnosis not present

## 2018-09-25 DIAGNOSIS — R109 Unspecified abdominal pain: Secondary | ICD-10-CM | POA: Diagnosis present

## 2018-09-25 DIAGNOSIS — Z7982 Long term (current) use of aspirin: Secondary | ICD-10-CM | POA: Insufficient documentation

## 2018-09-25 DIAGNOSIS — K5792 Diverticulitis of intestine, part unspecified, without perforation or abscess without bleeding: Secondary | ICD-10-CM | POA: Diagnosis not present

## 2018-09-25 DIAGNOSIS — K5730 Diverticulosis of large intestine without perforation or abscess without bleeding: Secondary | ICD-10-CM | POA: Diagnosis not present

## 2018-09-25 DIAGNOSIS — J449 Chronic obstructive pulmonary disease, unspecified: Secondary | ICD-10-CM | POA: Insufficient documentation

## 2018-09-25 LAB — URINALYSIS, ROUTINE W REFLEX MICROSCOPIC
Bilirubin Urine: NEGATIVE
Glucose, UA: NEGATIVE mg/dL
Hgb urine dipstick: NEGATIVE
Ketones, ur: NEGATIVE mg/dL
Leukocytes, UA: NEGATIVE
Nitrite: NEGATIVE
Protein, ur: NEGATIVE mg/dL
Specific Gravity, Urine: 1.004 — ABNORMAL LOW (ref 1.005–1.030)
pH: 7 (ref 5.0–8.0)

## 2018-09-25 LAB — COMPREHENSIVE METABOLIC PANEL
ALT: 24 U/L (ref 0–44)
AST: 32 U/L (ref 15–41)
Albumin: 3.7 g/dL (ref 3.5–5.0)
Alkaline Phosphatase: 79 U/L (ref 38–126)
Anion gap: 10 (ref 5–15)
BUN: 7 mg/dL — ABNORMAL LOW (ref 8–23)
CO2: 27 mmol/L (ref 22–32)
Calcium: 9.4 mg/dL (ref 8.9–10.3)
Chloride: 104 mmol/L (ref 98–111)
Creatinine, Ser: 1.17 mg/dL — ABNORMAL HIGH (ref 0.44–1.00)
GFR calc Af Amer: 52 mL/min — ABNORMAL LOW (ref >60)
GFR calc non Af Amer: 45 mL/min — ABNORMAL LOW (ref >60)
Glucose, Bld: 146 mg/dL — ABNORMAL HIGH (ref 70–99)
Potassium: 3.7 mmol/L (ref 3.5–5.1)
Sodium: 141 mmol/L (ref 135–145)
Total Bilirubin: 0.8 mg/dL (ref 0.3–1.2)
Total Protein: 6.8 g/dL (ref 6.5–8.1)

## 2018-09-25 LAB — CBC WITH DIFFERENTIAL/PLATELET
Abs Immature Granulocytes: 0.01 10*3/uL (ref 0.00–0.07)
Basophils Absolute: 0 10*3/uL (ref 0.0–0.1)
Basophils Relative: 0 %
Eosinophils Absolute: 0 10*3/uL (ref 0.0–0.5)
Eosinophils Relative: 1 %
HCT: 43.3 % (ref 36.0–46.0)
Hemoglobin: 13.9 g/dL (ref 12.0–15.0)
Immature Granulocytes: 0 %
Lymphocytes Relative: 23 %
Lymphs Abs: 1.8 10*3/uL (ref 0.7–4.0)
MCH: 31.1 pg (ref 26.0–34.0)
MCHC: 32.1 g/dL (ref 30.0–36.0)
MCV: 96.9 fL (ref 80.0–100.0)
Monocytes Absolute: 0.6 10*3/uL (ref 0.1–1.0)
Monocytes Relative: 8 %
Neutro Abs: 5.5 10*3/uL (ref 1.7–7.7)
Neutrophils Relative %: 68 %
Platelets: 242 10*3/uL (ref 150–400)
RBC: 4.47 MIL/uL (ref 3.87–5.11)
RDW: 14.8 % (ref 11.5–15.5)
WBC: 8 10*3/uL (ref 4.0–10.5)
nRBC: 0 % (ref 0.0–0.2)

## 2018-09-25 LAB — POC OCCULT BLOOD, ED: Fecal Occult Bld: NEGATIVE

## 2018-09-25 MED ORDER — CIPROFLOXACIN IN D5W 400 MG/200ML IV SOLN
400.0000 mg | Freq: Once | INTRAVENOUS | Status: AC
  Administered 2018-09-25: 400 mg via INTRAVENOUS
  Filled 2018-09-25: qty 200

## 2018-09-25 MED ORDER — METRONIDAZOLE 500 MG PO TABS: 500.0000 mg | ORAL_TABLET | Freq: Three times a day (TID) | ORAL | 0 refills | Status: DC

## 2018-09-25 MED ORDER — METRONIDAZOLE IN NACL 5-0.79 MG/ML-% IV SOLN
500.0000 mg | Freq: Once | INTRAVENOUS | Status: AC
  Administered 2018-09-25: 500 mg via INTRAVENOUS
  Filled 2018-09-25: qty 100

## 2018-09-25 MED ORDER — IOHEXOL 300 MG/ML  SOLN
100.0000 mL | Freq: Once | INTRAMUSCULAR | Status: AC | PRN
  Administered 2018-09-25: 100 mL via INTRAVENOUS

## 2018-09-25 MED ORDER — CIPROFLOXACIN HCL 500 MG PO TABS: 500.0000 mg | ORAL_TABLET | Freq: Two times a day (BID) | ORAL | 0 refills | Status: DC

## 2018-09-25 NOTE — ED Provider Notes (Signed)
[facility] EMERGENCY DEPARTMENT Provider Note   CSN: [id number] Arrival date & time: 09/25/18  1226     History   Chief Complaint Chief Complaint  Patient presents with  . Abdominal Pain    HPI [name] is a 78 y.o. female.  HPI  Patient presents to the emergency department with abdominal discomfort and some blood noted in the stool.  Patient states this started yesterday but got worse this morning.  The patient states nothing seems make the condition better.  Patient states that she did notice that the blood was little bit darker that was in the stool.  The patient denies chest pain, shortness of breath, headache,blurred vision, neck pain, fever, cough, weakness, numbness, dizziness, anorexia, edema,  vomiting, diarrhea, rash, back pain, dysuria, hematemesis, bloody stool, near syncope, or syncope. Past Medical History:  Diagnosis Date  . Arthritis   . Asthma   . COPD (chronic obstructive pulmonary disease) ([place])   . Depression   . GERD (gastroesophageal reflux disease)   . Hyperlipidemia   . Hypertension 06/15/2016  . Thyroid nodule     Patient Active Problem List   Diagnosis Date Noted  . Insomnia 08/24/2018  . Acute bronchitis 08/24/2018  . Prediabetes 05/27/2018  . Hypertension 06/15/2016  . Adenocarcinoma of left lung ([place]) 07/04/2015  . 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 Multinodular goiter (nontoxic) 07/18/2014  . Malnutrition of moderate degree ([place]) 05/24/2014  . Dyspnea 05/23/2014  . Other and unspecified hyperlipidemia 05/23/2014  . Rhinitis, allergic 11/22/2013  . COPD (chronic obstructive pulmonary disease) ([place]) 12/15/2012    Past Surgical History:  Procedure Laterality Date  . BIOPSY THYROID    . CATARACT EXTRACTION     left eye  . CATARACT EXTRACTION W/ INTRAOCULAR LENS IMPLANT     RIGHT  . CESAREAN SECTION    . EYE SURGERY    . MASS EXCISION N/A 12/13/2014   Procedure: EXCISION OF VOCAL CORD POLYPS;   Surgeon: Jerrell Belfast, MD;  Location: [facility];  Service: ENT;  Laterality: N/A;  . MICROLARYNGOSCOPY N/A 12/13/2014   Procedure: MICROLARYNGOSCOPY;  Surgeon: Jerrell Belfast, MD;  Location: [facility];  Service: ENT;  Laterality: N/A;  . TUBAL LIGATION    . VIDEO ASSISTED THORACOSCOPY (VATS)/ LOBECTOMY Left 07/04/2015   Procedure: VIDEO ASSISTED THORACOSCOPY (VATS) LEFT UPPER LOBECTOMY;  Surgeon: Melrose Nakayama, MD;  Location: [facility];  Service: Thoracic;  Laterality: Left;  Marland Kitchen VIDEO BRONCHOSCOPY WITH INSERTION OF INTERBRONCHIAL VALVE (IBV) N/A 07/11/2015   Procedure: VIDEO BRONCHOSCOPY WITH INSERTION OF INTERBRONCHIAL VALVE (IBV);  Surgeon: Melrose Nakayama, MD;  Location: [facility];  Service: Thoracic;  Laterality: N/A;  . VIDEO BRONCHOSCOPY WITH INSERTION OF INTERBRONCHIAL VALVE (IBV) N/A 09/10/2015   Procedure: VIDEO BRONCHOSCOPY WITH REMOVAL OF INTERBRONCHIAL VALVES;  Surgeon: Melrose Nakayama, MD;  Location: [facility];  Service: Thoracic;  Laterality: N/A;     OB History   No obstetric history on file.      Home Medications    Prior to Admission medications   Medication Sig Start Date End Date Taking? Authorizing Provider  aspirin 81 MG tablet Take 81 mg by mouth every evening.     [provider]  co-enzyme Q-10 30 MG capsule Take 30 mg by mouth daily.    [provider]  cyclobenzaprine (FLEXERIL) 5 MG tablet Take 1 tablet (5 mg total) by mouth 3 (three) times daily as needed for muscle spasms. 06/14/18  [name], FNP  donepezil (ARICEPT) 10 MG tablet TAKE 1 TABLET BY MOUTH EVERY EVENING Patient taking differently: Take 10 mg by mouth every evening.  06/20/18   [name], FNP  ezetimibe (ZETIA) 10 MG tablet TAKE 1 TABLET BY MOUTH EVERYDAY AT BEDTIME Patient taking differently: Take 10 mg by mouth at bedtime.  09/15/18   [name], FNP  KLOR-CON M20 20 MEQ tablet TAKE 1 TABLET BY MOUTH EVERY DAY WITH FOOD Patient taking differently: Take 20 mEq by mouth  daily.  09/15/18   [name], FNP  Magnesium Oxide (MAG-200) 200 MG TABS Take 200 mg by mouth daily.     [provider]  Melatonin 2.5 MG CAPS Take 2.5 mg by mouth at bedtime as needed (sleep).     [provider]  mirtazapine (REMERON) 15 MG tablet Take 1 tablet (15 mg total) by mouth at bedtime. 08/24/18   [name], FNP  Multiple Vitamin (MULTIVITAMIN) tablet Take 1 tablet by mouth daily.    [provider]  niacin (NIASPAN) 500 MG CR tablet Take 500 mg by mouth every evening.     [provider]  OVER THE COUNTER MEDICATION Take 1 tablet by mouth daily.    [provider]  pantoprazole (PROTONIX) 40 MG tablet Take 40 mg by mouth 2 (two) times daily.    [provider]  valACYclovir (VALTREX) 500 MG tablet TAKE 1 TABLET BY MOUTH EVERY DAY Patient taking differently: Take 500 mg by mouth daily.  09/15/18   [name], FNP  VENTOLIN HFA 108 (90 Base) MCG/ACT inhaler INHALE 2 PUFFS INTO THE LUNGS EVERY 6 HOURS AS NEEDED FOR WHEEZING OR SHORTNESS OF BREATH. Patient taking differently: Inhale 2 puffs into the lungs every 6 (six) hours as needed for wheezing or shortness of breath.  01/31/18   Rigoberto Noel, MD  White Petrolatum-Mineral Oil (SYSTANE NIGHTTIME OP) Place 1 drop into both eyes at bedtime as needed (for dry eyes).     [provider]    Family History Family History  Problem Relation Age of Onset  . Renal cancer Brother   . Diabetes Mother   . Hypertension Mother   . Renal Disease Mother     Social History Social History   Tobacco Use  . Smoking status: Former Smoker    Packs/day: 1.00    Years: 56.00    Pack years: 56.00    Types: Cigarettes    Last attempt to quit: 06/22/2015    Years since quitting: 3.2  . Smokeless tobacco: Never Used  Substance Use Topics  . Alcohol use: No    Alcohol/week: 0.0 standard drinks  . Drug use: No     Allergies   Patient has no known allergies.   Review  of Systems Review of Systems  All other systems negative except as documented in the HPI. All pertinent positives and negatives as reviewed in the HPI. Physical Exam Updated Vital Signs BP (!) 149/93   Pulse 100   Temp 98.6 F (37 C) (Oral)   Resp 20   Ht 5\' 3"  (1.6 m)   Wt 71.2 kg   SpO2 96%   BMI 27.81 kg/m   Physical Exam Vitals signs and nursing note reviewed.  Constitutional:      General: She is not in acute distress.    Appearance: She is well-developed.  HENT:     Head: Normocephalic and atraumatic.  Eyes:     Pupils: Pupils are equal, round,  and reactive to light.  Neck:     Musculoskeletal: Normal range of motion and neck supple.  Cardiovascular:     Rate and Rhythm: Normal rate and regular rhythm.     Heart sounds: Normal heart sounds. No murmur. No friction rub. No gallop.   Pulmonary:     Effort: Pulmonary effort is normal. No respiratory distress.     Breath sounds: Normal breath sounds. No wheezing.  Abdominal:     General: Bowel sounds are normal. There is no distension.     Palpations: Abdomen is soft.     Tenderness: There is no abdominal tenderness.  Skin:    General: Skin is warm and dry.     Capillary Refill: Capillary refill takes less than 2 seconds.     Findings: No erythema or rash.  Neurological:     Mental Status: She is alert and oriented to person, place, and time.     Motor: No abnormal muscle tone.     Coordination: Coordination normal.  Psychiatric:        Behavior: Behavior normal.      ED Treatments / Results  Labs (all labs ordered are listed, but only abnormal results are displayed) Labs Reviewed  COMPREHENSIVE METABOLIC PANEL - Abnormal; Notable for the following components:      Result Value   Glucose, Bld 146 (*)    BUN 7 (*)    Creatinine, Ser 1.17 (*)    GFR calc non Af Amer 45 (*)    GFR calc Af Amer 52 (*)    All other components within normal limits  URINALYSIS, ROUTINE W REFLEX MICROSCOPIC - Abnormal; Notable  for the following components:   Color, Urine STRAW (*)    Specific Gravity, Urine 1.004 (*)    All other components within normal limits  CBC WITH DIFFERENTIAL/PLATELET  POC OCCULT BLOOD, ED    EKG None  Radiology Ct Abdomen Pelvis W Contrast  Result Date: 09/25/2018 CLINICAL DATA:  Left lower quadrant pain and bloody stools EXAM: CT ABDOMEN AND PELVIS WITH CONTRAST TECHNIQUE: Multidetector CT imaging of the abdomen and pelvis was performed using the standard protocol following bolus administration of intravenous contrast. CONTRAST:  195mL OMNIPAQUE IOHEXOL 300 MG/ML  SOLN COMPARISON:  None FINDINGS: Lower chest: No acute abnormality. Hepatobiliary: Mild fatty infiltration of the liver is noted. The gallbladder is within normal limits. Pancreas: Unremarkable. No pancreatic ductal dilatation or surrounding inflammatory changes. Spleen: Normal in size without focal abnormality. Adrenals/Urinary Tract: Adrenal glands are within normal limits bilaterally. Kidneys demonstrate no renal calculi or obstructive changes. A few tiny cysts are noted within the left kidney. Normal excretion of contrast is noted bilaterally. The bladder is decompressed. Stomach/Bowel: Scattered diverticular change of the colon is noted. Minimal inflammatory changes are seen suggestive of very early diverticulitis. The appendix is within normal limits. No small bowel abnormality is seen. The stomach is decompressed. Vascular/Lymphatic: No significant vascular findings are present. No enlarged abdominal or pelvic lymph nodes. Reproductive: Uterus and bilateral adnexa are unremarkable. Other: No free fluid is noted. Musculoskeletal: Degenerative changes of the lumbar spine are noted. IMPRESSION: Changes consistent with very early diverticulitis. No other acute abnormality is noted. Electronically Signed   By: Inez Catalina M.D.   On: 09/25/2018 15:41    Procedures Procedures (including critical care time)  Medications Ordered in  ED Medications  ciprofloxacin (CIPRO) IVPB 400 mg (has no administration in time range)    And  metroNIDAZOLE (FLAGYL) IVPB 500 mg (has  no administration in time range)  iohexol (OMNIPAQUE) 300 MG/ML solution 100 mL (100 mLs Intravenous Contrast Given 09/25/18 1517)     Initial Impression / Assessment and Plan / ED Course  I have reviewed the triage vital signs and the nursing notes.  Pertinent labs & imaging results that were available during my care of the patient were reviewed by me and considered in my medical decision making (see chart for details).     I discussed the patient's findings with her and that this is a very early diverticulitis.  I advised her that this condition could worsen she will need to return to the emergency department.  The patient voiced an understanding all questions were answered.  Patient is given IV fluids along with antibiotics and then will be discharged home with strict return precautions.  Patient will need to follow-up with her primary doctor.  Final Clinical Impressions(s) / ED Diagnoses   Final diagnoses:  None    ED Discharge Orders    None       Dalia Heading, PA-C 09/25/18 1635    Pattricia Boss, MD 09/26/18 [phone]

## 2018-09-25 NOTE — ED Notes (Signed)
Reviewed d/c instructions with pt, who verbalized understanding and had no outstanding questions. Pt departed in NAD.   

## 2018-09-25 NOTE — Discharge Instructions (Signed)
Please read and follow all provided instructions.  Your diagnoses today include:  1. Diverticulitis     Tests performed today include:  Blood counts and electrolytes  Blood tests to check liver and kidney function  Blood tests to check pancreas function  Urine test to look for infection  CT scan - shows mild diverticulitis  Vital signs. See below for your results today.   Medications prescribed:   Ciprofloxacin - antibiotic  You have been prescribed an antibiotic medicine: take the entire course of medicine even if you are feeling better. Stopping early can cause the antibiotic not to work.   Metronidazole - antibiotic  You have been prescribed an antibiotic medicine: take the entire course of medicine even if you are feeling better. Stopping early can cause the antibiotic not to work. Do not drink alcohol when taking this medication.   Take any prescribed medications only as directed.  Home care instructions:   Follow any educational materials contained in this packet.  Follow-up instructions: Please follow-up with your primary care provider in the next 3 days for further evaluation of your symptoms.    Return instructions:  SEEK IMMEDIATE MEDICAL ATTENTION IF:  The pain does not go away or becomes severe   A temperature above 101F develops   Repeated vomiting occurs (multiple episodes)   The pain becomes localized to portions of the abdomen. The right side could possibly be appendicitis. In an adult, the left lower portion of the abdomen could be colitis or diverticulitis.   Blood is being passed in stools or vomit (bright red or black tarry stools)   You develop chest pain, difficulty breathing, dizziness or fainting, or become confused, poorly responsive, or inconsolable (young children)  If you have any other emergent concerns regarding your health  Additional Information: Abdominal (belly) pain can be caused by many things. Your caregiver performed an  examination and possibly ordered blood/urine tests and imaging (CT scan, x-rays, ultrasound). Many cases can be observed and treated at home after initial evaluation in the emergency department. Even though you are being discharged home, abdominal pain can be unpredictable. Therefore, you need a repeated exam if your pain does not resolve, returns, or worsens. Most patients with abdominal pain don't have to be admitted to the hospital or have surgery, but serious problems like appendicitis and gallbladder attacks can start out as nonspecific pain. Many abdominal conditions cannot be diagnosed in one visit, so follow-up evaluations are very important.  Your vital signs today were: BP (!) 135/97 (BP Location: Right Arm)    Pulse 76    Temp 98.6 F (37 C) (Oral)    Resp 14    Ht 5\' 3"  (1.6 m)    Wt 71.2 kg    SpO2 95%    BMI 27.81 kg/m  If your blood pressure (bp) was elevated above 135/85 this visit, please have this repeated by your doctor within one month. --------------

## 2018-09-25 NOTE — ED Notes (Signed)
Patient transported to CT 

## 2018-09-25 NOTE — ED Triage Notes (Signed)
Pt arrives to ED from home with complaints of left lower quadrant abdominal pain and dark red blood in the stool starting this morning. Pt placed in position of comfort with bed locked and lowered, call bell in reach.

## 2018-09-28 ENCOUNTER — Telehealth: Payer: Self-pay

## 2018-09-28 NOTE — Telephone Encounter (Signed)
Attempted to call pt to schedule her a ER follow up asap left pt v/m to call office. YRL,RMA

## 2018-10-02 ENCOUNTER — Ambulatory Visit (INDEPENDENT_AMBULATORY_CARE_PROVIDER_SITE_OTHER): Payer: Medicare HMO | Admitting: Nurse Practitioner

## 2018-10-02 VITALS — BP 124/76 | HR 83 | Temp 98.0°F | Ht 62.8 in | Wt 154.4 lb

## 2018-10-02 DIAGNOSIS — Z09 Encounter for follow-up examination after completed treatment for conditions other than malignant neoplasm: Secondary | ICD-10-CM

## 2018-10-02 DIAGNOSIS — K5733 Diverticulitis of large intestine without perforation or abscess with bleeding: Secondary | ICD-10-CM

## 2018-10-02 NOTE — Progress Notes (Signed)
Subjective:     Patient ID: [name] , female    DOB: 1941/06/28 , 78 y.o.   MRN: [id number]   Chief Complaint  Patient presents with  . Hospitalization Follow-up    patient states she was having a flare up from her diverticulitis and she needs a referral to the stomach doctor.    HPI  Here for hospital follow up after having stomach pain and blood in her stool.  She was seen in ED on 09/25/2018, given IV fluids and antibiotic.  Has not seen blood since. Has a previous history of diverticulitis, last flare in 2017.  She ate popcorn the week before the pain, she is also having issues with constipation.  She was taking stool softener.    Abdominal Pain  This is a recurrent problem. The current episode started 1 to 4 weeks ago. The onset quality is sudden. The problem has been gradually worsening. The quality of the pain is aching. Pertinent negatives include no anorexia, constipation (had been taking stool softner) or nausea. Nothing aggravates the pain. There is no history of pancreatitis.     Past Medical History:  Diagnosis Date  . Arthritis   . Asthma   . COPD (chronic obstructive pulmonary disease) ([place])   . Depression   . GERD (gastroesophageal reflux disease)   . Hyperlipidemia   . Hypertension 06/15/2016  . Thyroid nodule      Family History  Problem Relation Age of Onset  . Renal cancer Brother   . Diabetes Mother   . Hypertension Mother   . Renal Disease Mother      Current Outpatient Medications:  .  aspirin 81 MG tablet, Take 81 mg by mouth every evening. , Disp: , Rfl:  .  ciprofloxacin (CIPRO) 500 MG tablet, Take 1 tablet (500 mg total) by mouth 2 (two) times daily., Disp: 14 tablet, Rfl: 0 .  co-enzyme Q-10 30 MG capsule, Take 30 mg by mouth daily., Disp: , Rfl:  .  cyclobenzaprine (FLEXERIL) 5 MG tablet, Take 1 tablet (5 mg total) by mouth 3 (three) times daily as needed for muscle spasms., Disp: 20 tablet, Rfl: 0 .  donepezil (ARICEPT) 10 MG tablet,  TAKE 1 TABLET BY MOUTH EVERY EVENING (Patient taking differently: Take 10 mg by mouth every evening. ), Disp: 90 tablet, Rfl: 1 .  ezetimibe (ZETIA) 10 MG tablet, TAKE 1 TABLET BY MOUTH EVERYDAY AT BEDTIME (Patient taking differently: Take 10 mg by mouth at bedtime. ), Disp: 90 tablet, Rfl: 1 .  KLOR-CON M20 20 MEQ tablet, TAKE 1 TABLET BY MOUTH EVERY DAY WITH FOOD (Patient taking differently: Take 20 mEq by mouth daily. ), Disp: 90 tablet, Rfl: 1 .  Melatonin 2.5 MG CAPS, Take 2.5 mg by mouth at bedtime as needed (sleep). , Disp: , Rfl:  .  metroNIDAZOLE (FLAGYL) 500 MG tablet, Take 1 tablet (500 mg total) by mouth 3 (three) times daily., Disp: 21 tablet, Rfl: 0 .  mirtazapine (REMERON) 15 MG tablet, Take 1 tablet (15 mg total) by mouth at bedtime., Disp: 30 tablet, Rfl: 5 .  Multiple Vitamin (MULTIVITAMIN) tablet, Take 1 tablet by mouth daily., Disp: , Rfl:  .  niacin (NIASPAN) 500 MG CR tablet, Take 500 mg by mouth every evening. , Disp: , Rfl:  .  OVER THE COUNTER MEDICATION, Take 1 tablet by mouth daily., Disp: , Rfl:  .  pantoprazole (PROTONIX) 40 MG tablet, Take 40 mg by mouth 2 (two) times daily., Disp: ,  Rfl:  .  valACYclovir (VALTREX) 500 MG tablet, TAKE 1 TABLET BY MOUTH EVERY DAY (Patient taking differently: Take 500 mg by mouth daily. ), Disp: 30 tablet, Rfl: 0 .  VENTOLIN HFA 108 (90 Base) MCG/ACT inhaler, INHALE 2 PUFFS INTO THE LUNGS EVERY 6 HOURS AS NEEDED FOR WHEEZING OR SHORTNESS OF BREATH. (Patient taking differently: Inhale 2 puffs into the lungs every 6 (six) hours as needed for wheezing or shortness of breath. ), Disp: 18 Inhaler, Rfl: 2 .  White Petrolatum-Mineral Oil (SYSTANE NIGHTTIME OP), Place 1 drop into both eyes at bedtime as needed (for dry eyes). , Disp: , Rfl:    No Known Allergies   Review of Systems  Constitutional: Negative for fatigue.  Cardiovascular: Negative for chest pain, palpitations and leg swelling.  Gastrointestinal: Positive for abdominal pain.  Negative for anorexia, constipation (had been taking stool softner) and nausea.  Neurological: Negative.      Today's Vitals   10/02/18 1506  BP: 124/76  Pulse: 83  Temp: 98 F (36.7 C)  TempSrc: Oral  SpO2: 94%  Weight: 154 lb 6.4 oz (70 kg)  Height: 5' 2.8" (1.595 m)  PainSc: 0-No pain   Body mass index is 27.53 kg/m.   Objective:  Physical Exam Constitutional:      Appearance: Normal appearance.  Cardiovascular:     Rate and Rhythm: Normal rate and regular rhythm.     Pulses: Normal pulses.     Heart sounds: Normal heart sounds. No murmur.  Pulmonary:     Effort: Pulmonary effort is normal.     Breath sounds: Normal breath sounds.  Abdominal:     General: Abdomen is flat. Bowel sounds are normal. There is no distension.     Palpations: Abdomen is soft.     Tenderness: There is no abdominal tenderness.  Neurological:     General: No focal deficit present.     Mental Status: She is alert and oriented to person, place, and time.         Assessment And Plan:     1. Diverticulitis of colon with hemorrhage  Treated with IV Cipro and Flagyl in the ER on 09/25/2018, CT scan revealed early diverticulitis, she has not had any bleeding since 09/26/2018 - Ambulatory referral to Gastroenterology    [name], FNP

## 2018-10-02 NOTE — Patient Instructions (Signed)

## 2018-10-03 ENCOUNTER — Encounter: Payer: Self-pay | Admitting: Nurse Practitioner

## 2018-10-05 ENCOUNTER — Other Ambulatory Visit: Payer: Self-pay | Admitting: Pulmonary Disease

## 2018-10-05 DIAGNOSIS — K5900 Constipation, unspecified: Secondary | ICD-10-CM | POA: Diagnosis not present

## 2018-10-05 DIAGNOSIS — R933 Abnormal findings on diagnostic imaging of other parts of digestive tract: Secondary | ICD-10-CM | POA: Diagnosis not present

## 2018-10-05 DIAGNOSIS — K5730 Diverticulosis of large intestine without perforation or abscess without bleeding: Secondary | ICD-10-CM | POA: Diagnosis not present

## 2018-10-05 DIAGNOSIS — R1032 Left lower quadrant pain: Secondary | ICD-10-CM | POA: Diagnosis not present

## 2018-10-05 NOTE — Telephone Encounter (Signed)
Patient calling about refill for Trelegy.  [phone]. Patient phone number is [phone].

## 2018-10-08 ENCOUNTER — Other Ambulatory Visit: Payer: Self-pay | Admitting: Nurse Practitioner

## 2018-10-18 ENCOUNTER — Ambulatory Visit (INDEPENDENT_AMBULATORY_CARE_PROVIDER_SITE_OTHER): Payer: Medicare HMO | Admitting: Internal Medicine

## 2018-10-18 ENCOUNTER — Encounter: Payer: Self-pay | Admitting: Internal Medicine

## 2018-10-18 VITALS — BP 120/74 | HR 80 | Temp 98.2°F | Ht 62.4 in | Wt 154.2 lb

## 2018-10-18 DIAGNOSIS — M25512 Pain in left shoulder: Secondary | ICD-10-CM

## 2018-10-18 DIAGNOSIS — Z79899 Other long term (current) drug therapy: Secondary | ICD-10-CM | POA: Diagnosis not present

## 2018-10-18 DIAGNOSIS — Z09 Encounter for follow-up examination after completed treatment for conditions other than malignant neoplasm: Secondary | ICD-10-CM

## 2018-10-18 DIAGNOSIS — R030 Elevated blood-pressure reading, without diagnosis of hypertension: Secondary | ICD-10-CM

## 2018-10-19 LAB — VITAMIN B12: VITAMIN B 12: 989 pg/mL (ref 232–1245)

## 2018-10-29 NOTE — Progress Notes (Signed)
Subjective:     Patient ID: [name] , female    DOB: [birth year] , 78 y.o.   MRN: [id number]   Chief Complaint  Patient presents with  . Hypertension    HPI  She is here today for f/u elevated blood pressure. Her BP was elevated at her last visit. She denies chest pain, headaches and shortness of breath. She is accompanied by her nephew today. He has no other concerns at this time.   Hypertension      Past Medical History:  Diagnosis Date  . Arthritis   . Asthma   . COPD (chronic obstructive pulmonary disease) ([place])   . Depression   . GERD (gastroesophageal reflux disease)   . Hyperlipidemia   . Hypertension 06/15/2016  . Thyroid nodule      Family History  Problem Relation Age of Onset  . Renal cancer Brother   . Diabetes Mother   . Hypertension Mother   . Renal Disease Mother   . Healthy Father      Current Outpatient Medications:  .  aspirin 81 MG tablet, Take 81 mg by mouth every evening. , Disp: , Rfl:  .  co-enzyme Q-10 30 MG capsule, Take 30 mg by mouth daily., Disp: , Rfl:  .  cyclobenzaprine (FLEXERIL) 5 MG tablet, Take 1 tablet (5 mg total) by mouth 3 (three) times daily as needed for muscle spasms., Disp: 20 tablet, Rfl: 0 .  cycloSPORINE (RESTASIS) 0.05 % ophthalmic emulsion, 1 drop 2 (two) times daily., Disp: , Rfl:  .  donepezil (ARICEPT) 10 MG tablet, TAKE 1 TABLET BY MOUTH EVERY EVENING (Patient taking differently: Take 10 mg by mouth every evening. ), Disp: 90 tablet, Rfl: 1 .  ezetimibe (ZETIA) 10 MG tablet, TAKE 1 TABLET BY MOUTH EVERYDAY AT BEDTIME (Patient taking differently: Take 10 mg by mouth at bedtime. ), Disp: 90 tablet, Rfl: 1 .  KLOR-CON M20 20 MEQ tablet, TAKE 1 TABLET BY MOUTH EVERY DAY WITH FOOD (Patient taking differently: Take 20 mEq by mouth daily. ), Disp: 90 tablet, Rfl: 1 .  Melatonin 2.5 MG CAPS, Take 2.5 mg by mouth at bedtime as needed (sleep). , Disp: , Rfl:  .  mirtazapine (REMERON) 15 MG tablet, TAKE 1 TABLET BY MOUTH  EVERYDAY AT BEDTIME, Disp: 90 tablet, Rfl: 0 .  Multiple Vitamin (MULTIVITAMIN) tablet, Take 1 tablet by mouth daily., Disp: , Rfl:  .  niacin (NIASPAN) 500 MG CR tablet, Take 500 mg by mouth every evening. , Disp: , Rfl:  .  OVER THE COUNTER MEDICATION, Take 1 tablet by mouth daily., Disp: , Rfl:  .  pantoprazole (PROTONIX) 40 MG tablet, Take 40 mg by mouth 2 (two) times daily., Disp: , Rfl:  .  Probiotic Product (PROBIOTIC PO), Take by mouth., Disp: , Rfl:  .  TRELEGY ELLIPTA 100-62.5-25 MCG/INH AEPB, TAKE 1 PUFF BY MOUTH EVERY DAY, Disp: 60 each, Rfl: 2 .  valACYclovir (VALTREX) 500 MG tablet, TAKE 1 TABLET BY MOUTH EVERY DAY, Disp: 30 tablet, Rfl: 0 .  VENTOLIN HFA 108 (90 Base) MCG/ACT inhaler, INHALE 2 PUFFS INTO THE LUNGS EVERY 6 HOURS AS NEEDED FOR WHEEZING OR SHORTNESS OF BREATH. (Patient taking differently: Inhale 2 puffs into the lungs every 6 (six) hours as needed for wheezing or shortness of breath. ), Disp: 18 Inhaler, Rfl: 2 .  White Petrolatum-Mineral Oil (SYSTANE NIGHTTIME OP), Place 1 drop into both eyes at bedtime as needed (for dry eyes). , Disp: , Rfl:  No Known Allergies   Review of Systems  Constitutional: Negative.   Respiratory: Negative.   Cardiovascular: Negative.   Gastrointestinal: Negative.   Musculoskeletal: Positive for arthralgias (she c/o l shoulder pain. denies fall/trauma. pain with movement. wants to see ortho. ).  Neurological: Negative.   Psychiatric/Behavioral: Negative.      Today's Vitals   10/18/18 1501  BP: 120/74  Pulse: 80  Temp: 98.2 F (36.8 C)  TempSrc: Oral  SpO2: 95%  Weight: 154 lb 3.2 oz (69.9 kg)  Height: 5' 2.4" (1.585 m)  PainSc: 0-No pain   Body mass index is 27.84 kg/m.   Objective:  Physical Exam Vitals signs and nursing note reviewed.  Constitutional:      Appearance: Normal appearance.  HENT:     Head: Normocephalic and atraumatic.  Cardiovascular:     Rate and Rhythm: Normal rate and regular rhythm.      Heart sounds: Normal heart sounds.  Pulmonary:     Effort: Pulmonary effort is normal.     Breath sounds: Normal breath sounds.  Musculoskeletal:     Comments: She has left shoulder tenderness to palpation. There is some pain with movement.   Skin:    General: Skin is warm.  Neurological:     General: No focal deficit present.     Mental Status: She is alert.  Psychiatric:        Mood and Affect: Mood normal.        Behavior: Behavior normal.         Assessment And Plan:     1. Elevated blood pressure reading  Her elevated blood pressure has resolved. She is encouraged to limit her salt intake.   2. Acute pain of left shoulder  I will refer her to Ortho, Dr. Rip Harbour for further evaluation.  She is also encouraged to apply topical pain cream to affected area as needed.   - Ambulatory referral to Sports Medicine  3. Drug therapy  She has been on long-term PPI therapy. Risks of long-term use were explained to the patient in full detail.   - Vitamin B12        Maximino Greenland, MD

## 2018-11-01 ENCOUNTER — Other Ambulatory Visit: Payer: Self-pay | Admitting: Nurse Practitioner

## 2018-11-03 ENCOUNTER — Other Ambulatory Visit: Payer: Self-pay | Admitting: Nurse Practitioner

## 2018-11-20 DIAGNOSIS — M25512 Pain in left shoulder: Secondary | ICD-10-CM | POA: Diagnosis not present

## 2018-11-21 DIAGNOSIS — S46812D Strain of other muscles, fascia and tendons at shoulder and upper arm level, left arm, subsequent encounter: Secondary | ICD-10-CM | POA: Diagnosis not present

## 2018-11-23 DIAGNOSIS — S46812D Strain of other muscles, fascia and tendons at shoulder and upper arm level, left arm, subsequent encounter: Secondary | ICD-10-CM | POA: Diagnosis not present

## 2018-11-29 ENCOUNTER — Other Ambulatory Visit: Payer: Self-pay

## 2018-11-29 ENCOUNTER — Ambulatory Visit: Payer: Medicare HMO | Admitting: Nurse Practitioner

## 2018-11-29 ENCOUNTER — Telehealth: Payer: Self-pay | Admitting: Nurse Practitioner

## 2018-11-29 MED ORDER — PANTOPRAZOLE SODIUM 40 MG PO TBEC: 40.0000 mg | DELAYED_RELEASE_TABLET | Freq: Two times a day (BID) | ORAL | 1 refills | Status: DC

## 2018-11-29 NOTE — Telephone Encounter (Signed)
PT CALLED REQ REFILL ON [facility]LE SENT TO [facility] [address]

## 2018-12-14 ENCOUNTER — Other Ambulatory Visit: Payer: Self-pay | Admitting: Nurse Practitioner

## 2018-12-25 ENCOUNTER — Other Ambulatory Visit: Payer: Medicare HMO

## 2018-12-25 ENCOUNTER — Ambulatory Visit (HOSPITAL_COMMUNITY): Admission: RE | Admit: 2018-12-25 | Payer: Medicare HMO | Source: Ambulatory Visit

## 2018-12-26 ENCOUNTER — Other Ambulatory Visit: Payer: Self-pay | Admitting: Internal Medicine

## 2018-12-27 ENCOUNTER — Ambulatory Visit: Payer: Medicare HMO | Admitting: Internal Medicine

## 2019-01-01 ENCOUNTER — Telehealth: Payer: Self-pay | Admitting: Pulmonary Disease

## 2019-01-01 ENCOUNTER — Other Ambulatory Visit: Payer: Self-pay | Admitting: Pulmonary Disease

## 2019-01-01 MED ORDER — FLUTICASONE-UMECLIDIN-VILANT 100-62.5-25 MCG/INH IN AEPB: 1.0000 | INHALATION_SPRAY | Freq: Every day | RESPIRATORY_TRACT | 2 refills | Status: DC

## 2019-01-01 NOTE — Telephone Encounter (Signed)
Called and spoke with patient. [facility] [address]. Refill sent.  Nothing further needed.

## 2019-01-03 ENCOUNTER — Other Ambulatory Visit: Payer: Self-pay | Admitting: Medical Oncology

## 2019-02-10 ENCOUNTER — Other Ambulatory Visit: Payer: Self-pay | Admitting: Nurse Practitioner

## 2019-02-10 DIAGNOSIS — G4700 Insomnia, unspecified: Secondary | ICD-10-CM

## 2019-02-15 ENCOUNTER — Other Ambulatory Visit: Payer: Self-pay | Admitting: Nurse Practitioner

## 2019-02-15 ENCOUNTER — Encounter: Payer: Self-pay | Admitting: Internal Medicine

## 2019-02-15 ENCOUNTER — Ambulatory Visit (INDEPENDENT_AMBULATORY_CARE_PROVIDER_SITE_OTHER): Payer: Medicare HMO | Admitting: Internal Medicine

## 2019-02-15 ENCOUNTER — Other Ambulatory Visit: Payer: Self-pay

## 2019-02-15 VITALS — BP 112/68 | HR 70 | Temp 98.6°F | Ht 62.04 in | Wt 157.4 lb

## 2019-02-15 DIAGNOSIS — J449 Chronic obstructive pulmonary disease, unspecified: Secondary | ICD-10-CM | POA: Diagnosis not present

## 2019-02-15 DIAGNOSIS — E1122 Type 2 diabetes mellitus with diabetic chronic kidney disease: Secondary | ICD-10-CM

## 2019-02-15 DIAGNOSIS — E2839 Other primary ovarian failure: Secondary | ICD-10-CM

## 2019-02-15 DIAGNOSIS — N183 Chronic kidney disease, stage 3 unspecified: Secondary | ICD-10-CM

## 2019-02-15 DIAGNOSIS — I129 Hypertensive chronic kidney disease with stage 1 through stage 4 chronic kidney disease, or unspecified chronic kidney disease: Secondary | ICD-10-CM | POA: Diagnosis not present

## 2019-02-15 DIAGNOSIS — E559 Vitamin D deficiency, unspecified: Secondary | ICD-10-CM | POA: Diagnosis not present

## 2019-02-15 LAB — POCT UA - MICROALBUMIN
Albumin/Creatinine Ratio, Urine, POC: <30
Creatinine, POC: 50 mg/dL
Microalbumin Ur, POC: 10 mg/L

## 2019-02-15 NOTE — Patient Instructions (Signed)
Chronic Kidney Disease, Adult Chronic kidney disease (CKD) happens when the kidneys are damaged over a long period of time. The kidneys are two organs that help with:  Getting rid of waste and extra fluid from the blood.  Making hormones that maintain the amount of fluid in your tissues and blood vessels.  Making sure that the body has the right amount of fluids and chemicals. Most of the time, CKD does not go away, but it can usually be controlled. Steps must be taken to slow down the kidney damage or to stop it from getting worse. If this is not done, the kidneys may stop working. Follow these instructions at home: Medicines  Take over-the-counter and prescription medicines only as told by your doctor. You may need to change the amount of medicines you take.  Do not take any new medicines unless your doctor says it is okay. Many medicines can make your kidney damage worse.  Do not take any vitamin and supplements unless your doctor says it is okay. Many vitamins and supplements can make your kidney damage worse. General instructions  Follow a diet as told by your doctor. You may need to stay away from: ? Alcohol. ? Salty foods. ? Foods that are high in:  Potassium.  Calcium.  Protein.  Do not use any products that contain nicotine or tobacco, such as cigarettes and e-cigarettes. If you need help quitting, ask your doctor.  Keep track of your blood pressure at home. Tell your doctor about any changes.  If you have diabetes, keep track of your blood sugar as told by your doctor.  Try to stay at a healthy weight. If you need help, ask your doctor.  Exercise at least 30 minutes a day, 5 days a week.  Stay up-to-date with your shots (immunizations) as told by your doctor.  Keep all follow-up visits as told by your doctor. This is important. Contact a doctor if:  Your symptoms get worse.  You have new symptoms. Get help right away if:  You have symptoms of end-stage  kidney disease. These may include: ? Headaches. ? Numbness in your hands or feet. ? Easy bruising. ? Having hiccups often. ? Chest pain. ? Shortness of breath. ? Stopping of menstrual periods in women.  You have a fever.  You have very little pee (urine).  You have pain or bleeding when you pee. Summary  Chronic kidney disease (CKD) happens when the kidneys are damaged over a long period of time.  Most of the time, this condition does not go away, but it can usually be controlled. Steps must be taken to slow down the kidney damage or to stop it from getting worse.  Treatment may include a combination of medicines and lifestyle changes. This information is not intended to replace advice given to you by your health care provider. Make sure you discuss any questions you have with your health care provider. Document Released: 11/17/2009 Document Revised: 09/27/2016 Document Reviewed: 09/27/2016 Elsevier Interactive Patient Education  2019 [employer].

## 2019-02-16 LAB — CMP14+EGFR
ALT: 28 IU/L (ref 0–32)
AST: 34 IU/L (ref 0–40)
Albumin/Globulin Ratio: 1.7 (ref 1.2–2.2)
Albumin: 4.5 g/dL (ref 3.7–4.7)
Alkaline Phosphatase: 95 IU/L (ref 39–117)
BUN/Creatinine Ratio: 9 — ABNORMAL LOW (ref 12–28)
BUN: 10 mg/dL (ref 8–27)
Bilirubin Total: 0.7 mg/dL (ref 0.0–1.2)
CO2: 25 mmol/L (ref 20–29)
Calcium: 9.9 mg/dL (ref 8.7–10.3)
Chloride: 103 mmol/L (ref 96–106)
Creatinine, Ser: 1.06 mg/dL — ABNORMAL HIGH (ref 0.57–1.00)
GFR calc Af Amer: 59 mL/min/{1.73_m2} — ABNORMAL LOW (ref >59)
GFR calc non Af Amer: 51 mL/min/{1.73_m2} — ABNORMAL LOW (ref >59)
Globulin, Total: 2.7 g/dL (ref 1.5–4.5)
Glucose: 98 mg/dL (ref 65–99)
Potassium: 4.3 mmol/L (ref 3.5–5.2)
Sodium: 143 mmol/L (ref 134–144)
Total Protein: 7.2 g/dL (ref 6.0–8.5)

## 2019-02-16 LAB — PROTEIN ELECTROPHORESIS, SERUM
A/G Ratio: 1.2 (ref 0.7–1.7)
Albumin ELP: 3.9 g/dL (ref 2.9–4.4)
Alpha 1: 0.2 g/dL (ref 0.0–0.4)
Alpha 2: 0.6 g/dL (ref 0.4–1.0)
Beta: 1.3 g/dL (ref 0.7–1.3)
Gamma Globulin: 1.1 g/dL (ref 0.4–1.8)
Globulin, Total: 3.3 g/dL (ref 2.2–3.9)

## 2019-02-16 LAB — HEMOGLOBIN A1C
Est. average glucose Bld gHb Est-mCnc: 134 mg/dL
Hgb A1c MFr Bld: 6.3 % — ABNORMAL HIGH (ref 4.8–5.6)

## 2019-02-16 LAB — VITAMIN D 25 HYDROXY (VIT D DEFICIENCY, FRACTURES): Vit D, 25-Hydroxy: 43.5 ng/mL (ref 30.0–100.0)

## 2019-02-16 LAB — LIPID PANEL
Chol/HDL Ratio: 4.5 ratio — ABNORMAL HIGH (ref 0.0–4.4)
Cholesterol, Total: 194 mg/dL (ref 100–199)
HDL: 43 mg/dL (ref >39)
LDL Calculated: 112 mg/dL — ABNORMAL HIGH (ref 0–99)
Triglycerides: 196 mg/dL — ABNORMAL HIGH (ref 0–149)
VLDL Cholesterol Cal: 39 mg/dL (ref 5–40)

## 2019-02-16 LAB — PHOSPHORUS: Phosphorus: 3.1 mg/dL (ref 3.0–4.3)

## 2019-02-17 NOTE — Progress Notes (Signed)
Subjective:     Patient ID: [name] , female    DOB: July 16, 1941 , 78 y.o.   MRN: [id number]   Chief Complaint  Patient presents with  . Diabetes  . Hypertension    HPI  She is here today for diabetes check. She is not taking any meds - has been controlled with diet. She has no specific concerns at this time.   Diabetes She presents for her follow-up diabetic visit. She has type 2 diabetes mellitus. Her disease course has been stable. There are no hypoglycemic associated symptoms. There are no diabetic associated symptoms. There are no hypoglycemic complications. Risk factors for coronary artery disease include diabetes mellitus, hypertension, post-menopausal and sedentary lifestyle. When asked about current treatments, none were reported. She is compliant with treatment most of the time. She is following a generally healthy diet. She participates in exercise intermittently.  Hypertension This is a chronic problem. The current episode started more than 1 year ago. The problem has been gradually improving since onset. The problem is controlled.     Past Medical History:  Diagnosis Date  . Arthritis   . Asthma   . COPD (chronic obstructive pulmonary disease) ([place])   . Depression   . GERD (gastroesophageal reflux disease)   . Hyperlipidemia   . Hypertension 06/15/2016  . Thyroid nodule      Family History  Problem Relation Age of Onset  . Renal cancer Brother   . Diabetes Mother   . Hypertension Mother   . Renal Disease Mother   . Healthy Father      Current Outpatient Medications:  .  aspirin 81 MG tablet, Take 81 mg by mouth every evening. , Disp: , Rfl:  .  co-enzyme Q-10 30 MG capsule, Take 30 mg by mouth daily., Disp: , Rfl:  .  cycloSPORINE (RESTASIS) 0.05 % ophthalmic emulsion, 1 drop 2 (two) times daily., Disp: , Rfl:  .  donepezil (ARICEPT) 10 MG tablet, TAKE 1 TABLET BY MOUTH EVERY EVENING, Disp: 90 tablet, Rfl: 1 .  ezetimibe (ZETIA) 10 MG tablet, TAKE 1  TABLET BY MOUTH EVERYDAY AT BEDTIME (Patient taking differently: Take 10 mg by mouth at bedtime. ), Disp: 90 tablet, Rfl: 1 .  Fluticasone-Umeclidin-Vilant (TRELEGY ELLIPTA) 100-62.5-25 MCG/INH AEPB, Take 1 puff by mouth daily., Disp: 60 each, Rfl: 2 .  KLOR-CON M20 20 MEQ tablet, TAKE 1 TABLET BY MOUTH EVERY DAY WITH FOOD (Patient taking differently: Take 20 mEq by mouth daily. ), Disp: 90 tablet, Rfl: 1 .  Melatonin 2.5 MG CAPS, Take 2.5 mg by mouth at bedtime as needed (sleep). , Disp: , Rfl:  .  mirtazapine (REMERON) 15 MG tablet, TAKE 1 TABLET BY MOUTH EVERYDAY AT BEDTIME, Disp: 30 tablet, Rfl: 2 .  Multiple Vitamin (MULTIVITAMIN) tablet, Take 1 tablet by mouth daily., Disp: , Rfl:  .  OVER THE COUNTER MEDICATION, Take 1 tablet by mouth daily., Disp: , Rfl:  .  pantoprazole (PROTONIX) 40 MG tablet, Take 1 tablet (40 mg total) by mouth 2 (two) times daily., Disp: 90 tablet, Rfl: 1 .  Probiotic Product (PROBIOTIC PO), Take by mouth., Disp: , Rfl:  .  valACYclovir (VALTREX) 500 MG tablet, TAKE 1 TABLET BY MOUTH EVERY DAY, Disp: 90 tablet, Rfl: 2 .  VENTOLIN HFA 108 (90 Base) MCG/ACT inhaler, INHALE 2 PUFFS INTO THE LUNGS EVERY 6 HOURS AS NEEDED FOR WHEEZING OR SHORTNESS OF BREATH. (Patient taking differently: Inhale 2 puffs into the lungs every 6 (six) hours  as needed for wheezing or shortness of breath. ), Disp: 18 Inhaler, Rfl: 2 .  White Petrolatum-Mineral Oil (SYSTANE NIGHTTIME OP), Place 1 drop into both eyes at bedtime as needed (for dry eyes). , Disp: , Rfl:  .  cyclobenzaprine (FLEXERIL) 5 MG tablet, Take 1 tablet (5 mg total) by mouth 3 (three) times daily as needed for muscle spasms. (Patient not taking: Reported on 02/15/2019), Disp: 20 tablet, Rfl: 0 .  niacin (NIASPAN) 500 MG CR tablet, TAKE 1 TABLET BY MOUTH EVERY DAY IN THE EVENING, Disp: 120 tablet, Rfl: 2   No Known Allergies   Review of Systems  Constitutional: Negative.   Respiratory: Negative.   Cardiovascular: Negative.    Gastrointestinal: Negative.   Neurological: Negative.   Psychiatric/Behavioral: Negative.      Today's Vitals   02/15/19 1045  BP: 112/68  Pulse: 70  Temp: 98.6 F (37 C)  TempSrc: Oral  Weight: 157 lb 6.4 oz (71.4 kg)  Height: 5' 2.04" (1.576 m)  PainSc: 0-No pain   Body mass index is 28.75 kg/m.   Objective:  Physical Exam Vitals signs and nursing note reviewed.  Constitutional:      Appearance: Normal appearance.  HENT:     Head: Normocephalic and atraumatic.  Cardiovascular:     Rate and Rhythm: Normal rate and regular rhythm.     Heart sounds: Normal heart sounds.  Pulmonary:     Effort: Pulmonary effort is normal.     Breath sounds: Normal breath sounds.     Comments: Decreased breath sounds at bases Skin:    General: Skin is warm.  Neurological:     General: No focal deficit present.     Mental Status: She is alert.  Psychiatric:        Mood and Affect: Mood normal.        Behavior: Behavior normal.         Assessment And Plan:     1. Diabetes mellitus with stage 3 chronic kidney disease ([place])  I will check labs as listed below. She is not taking any meds- her condition is well controlled with diet/lifestyle changes. She is encouraged to avoid processed foods and sugary beverages.   - Lipid panel - CMP14+EGFR - Hemoglobin A1c - Protein electrophoresis, serum - Phosphorus - POCT UA - Microalbumin  2. Hypertensive nephropathy  Well controlled. She will continue with current meds. She is encouraged to avoid adding salt to her foods. She will rto in 4 months for her next AWV   - POCT UA - Microalbumin  3. Chronic obstructive pulmonary disease, unspecified COPD type ([facility])  Chronic, yet stable. She reports compliance with inhalers.   4. Vitamin D deficiency disease  I WILL CHECK A VIT D LEVEL AND SUPPLEMENT AS NEEDED.  ALSO ENCOURAGED TO SPEND 15 MINUTES IN THE SUN DAILY.  - Vitamin D (25 hydroxy)    5. Estrogen deficiency  I will refer  her to [facility] for dexa scan. She is encouraged to increase daily activity as tolerated.   - DG Bone Density; Future   Maximino Greenland, MD    THE PATIENT IS ENCOURAGED TO PRACTICE SOCIAL DISTANCING DUE TO THE COVID-19 PANDEMIC.

## 2019-02-19 ENCOUNTER — Inpatient Hospital Stay: Payer: Medicare HMO | Attending: Internal Medicine

## 2019-02-19 ENCOUNTER — Encounter (HOSPITAL_COMMUNITY): Payer: Self-pay

## 2019-02-19 ENCOUNTER — Other Ambulatory Visit: Payer: Self-pay

## 2019-02-19 ENCOUNTER — Ambulatory Visit (HOSPITAL_COMMUNITY)
Admission: RE | Admit: 2019-02-19 | Discharge: 2019-02-19 | Disposition: A | Discharge status: Home / Self Care | Payer: Medicare HMO | Source: Ambulatory Visit | Attending: Internal Medicine | Admitting: Internal Medicine

## 2019-02-19 DIAGNOSIS — J439 Emphysema, unspecified: Secondary | ICD-10-CM | POA: Diagnosis not present

## 2019-02-19 DIAGNOSIS — C3490 Malignant neoplasm of unspecified part of unspecified bronchus or lung: Secondary | ICD-10-CM

## 2019-02-19 DIAGNOSIS — R911 Solitary pulmonary nodule: Secondary | ICD-10-CM | POA: Insufficient documentation

## 2019-02-19 DIAGNOSIS — I700 Atherosclerosis of aorta: Secondary | ICD-10-CM | POA: Diagnosis not present

## 2019-02-19 DIAGNOSIS — C3411 Malignant neoplasm of upper lobe, right bronchus or lung: Secondary | ICD-10-CM | POA: Insufficient documentation

## 2019-02-19 DIAGNOSIS — R918 Other nonspecific abnormal finding of lung field: Secondary | ICD-10-CM | POA: Diagnosis not present

## 2019-02-19 HISTORY — DX: Malignant (primary) neoplasm, unspecified: C80.1

## 2019-02-19 LAB — CBC WITH DIFFERENTIAL (CANCER CENTER ONLY)
Abs Immature Granulocytes: 0.01 10*3/uL (ref 0.00–0.07)
Basophils Absolute: 0 10*3/uL (ref 0.0–0.1)
Basophils Relative: 1 %
Eosinophils Absolute: 0.1 10*3/uL (ref 0.0–0.5)
Eosinophils Relative: 2 %
HCT: 44.8 % (ref 36.0–46.0)
Hemoglobin: 14.3 g/dL (ref 12.0–15.0)
Immature Granulocytes: 0 %
Lymphocytes Relative: 39 %
Lymphs Abs: 2.5 10*3/uL (ref 0.7–4.0)
MCH: 31.2 pg (ref 26.0–34.0)
MCHC: 31.9 g/dL (ref 30.0–36.0)
MCV: 97.8 fL (ref 80.0–100.0)
Monocytes Absolute: 0.6 10*3/uL (ref 0.1–1.0)
Monocytes Relative: 9 %
Neutro Abs: 3.1 10*3/uL (ref 1.7–7.7)
Neutrophils Relative %: 49 %
Platelet Count: 248 10*3/uL (ref 150–400)
RBC: 4.58 MIL/uL (ref 3.87–5.11)
RDW: 15.1 % (ref 11.5–15.5)
WBC Count: 6.4 10*3/uL (ref 4.0–10.5)
nRBC: 0 % (ref 0.0–0.2)

## 2019-02-19 LAB — CMP (CANCER CENTER ONLY)
ALT: 30 U/L (ref 0–44)
AST: 32 U/L (ref 15–41)
Albumin: 4.1 g/dL (ref 3.5–5.0)
Alkaline Phosphatase: 95 U/L (ref 38–126)
Anion gap: 10 (ref 5–15)
BUN: 8 mg/dL (ref 8–23)
CO2: 29 mmol/L (ref 22–32)
Calcium: 9.8 mg/dL (ref 8.9–10.3)
Chloride: 105 mmol/L (ref 98–111)
Creatinine: 1.21 mg/dL — ABNORMAL HIGH (ref 0.44–1.00)
GFR, Est AFR Am: 50 mL/min — ABNORMAL LOW (ref >60)
GFR, Estimated: 43 mL/min — ABNORMAL LOW (ref >60)
Glucose, Bld: 115 mg/dL — ABNORMAL HIGH (ref 70–99)
Potassium: 4 mmol/L (ref 3.5–5.1)
Sodium: 144 mmol/L (ref 135–145)
Total Bilirubin: 0.8 mg/dL (ref 0.3–1.2)
Total Protein: 7.7 g/dL (ref 6.5–8.1)

## 2019-02-19 MED ORDER — SODIUM CHLORIDE (PF) 0.9 % IJ SOLN
INTRAMUSCULAR | Status: AC
  Filled 2019-02-19: qty 50

## 2019-02-19 MED ORDER — IOHEXOL 300 MG/ML  SOLN
75.0000 mL | Freq: Once | INTRAMUSCULAR | Status: AC | PRN
  Administered 2019-02-19: 75 mL via INTRAVENOUS

## 2019-02-21 ENCOUNTER — Other Ambulatory Visit: Payer: Self-pay | Admitting: Internal Medicine

## 2019-02-21 ENCOUNTER — Other Ambulatory Visit: Payer: Self-pay

## 2019-02-21 ENCOUNTER — Inpatient Hospital Stay (HOSPITAL_BASED_OUTPATIENT_CLINIC_OR_DEPARTMENT_OTHER): Payer: Medicare HMO | Admitting: Internal Medicine

## 2019-02-21 ENCOUNTER — Encounter: Payer: Self-pay | Admitting: Internal Medicine

## 2019-02-21 VITALS — BP 142/67 | HR 65 | Temp 98.5°F | Resp 20 | Ht 62.0 in | Wt 157.8 lb

## 2019-02-21 DIAGNOSIS — R911 Solitary pulmonary nodule: Secondary | ICD-10-CM | POA: Diagnosis not present

## 2019-02-21 DIAGNOSIS — C3411 Malignant neoplasm of upper lobe, right bronchus or lung: Secondary | ICD-10-CM | POA: Diagnosis not present

## 2019-02-21 DIAGNOSIS — C3490 Malignant neoplasm of unspecified part of unspecified bronchus or lung: Secondary | ICD-10-CM

## 2019-02-21 DIAGNOSIS — C3492 Malignant neoplasm of unspecified part of left bronchus or lung: Secondary | ICD-10-CM

## 2019-02-21 NOTE — Progress Notes (Signed)
[facility] [phone]   [phone]  OFFICE PROGRESS NOTE  [name], [place] [address] [address]  DIAGNOSIS: Stage IB (T2a, N0, M0) non-small cell lung cancer, adenocarcinoma presented with left upper lobe nodule diagnosed in September 2016.  PRIOR THERAPY: Status post left VATS with left upper lobectomy and mediastinal lymph node dissection on 07/04/2015 under the care of Dr. Roxan Hockey.  CURRENT THERAPY: Observation.  INTERVAL HISTORY: [name] 78 y.o. female returns to the clinic today for follow-up visit.  The patient is feeling fine today with no concerning complaints except for shortness of breath with exertion.  She denied having any chest pain, cough or hemoptysis.  She denied having any fever or chills.  She has no nausea, vomiting, diarrhea or constipation.  She denied having any headache or visual changes.  She is here today for evaluation with repeat CT scan of the chest for restaging of her disease.  MEDICAL HISTORY: Past Medical History:  Diagnosis Date  . Arthritis   . Asthma   . COPD (chronic obstructive pulmonary disease) ([facility])   . Depression   . GERD (gastroesophageal reflux disease)   . Hyperlipidemia   . Hypertension 06/15/2016  . left lung ca dx'd 2016  . Thyroid nodule     ALLERGIES:  has No Known Allergies.  MEDICATIONS:  Current Outpatient Medications  Medication Sig Dispense Refill  . aspirin 81 MG tablet Take 81 mg by mouth every evening.     Marland Kitchen co-enzyme Q-10 30 MG capsule Take 30 mg by mouth daily.    . cyclobenzaprine (FLEXERIL) 5 MG tablet Take 1 tablet (5 mg total) by mouth 3 (three) times daily as needed for muscle spasms. (Patient not taking: Reported on 02/15/2019) 20 tablet 0  . cycloSPORINE (RESTASIS) 0.05 % ophthalmic emulsion 1 drop 2 (two) times daily.    Marland Kitchen donepezil (ARICEPT) 10 MG tablet TAKE 1 TABLET BY MOUTH EVERY EVENING 90 tablet 1  . ezetimibe (ZETIA)  10 MG tablet TAKE 1 TABLET BY MOUTH EVERYDAY AT BEDTIME (Patient taking differently: Take 10 mg by mouth at bedtime. ) 90 tablet 1  . Fluticasone-Umeclidin-Vilant (TRELEGY ELLIPTA) 100-62.5-25 MCG/INH AEPB Take 1 puff by mouth daily. 60 each 2  . KLOR-CON M20 20 MEQ tablet TAKE 1 TABLET BY MOUTH EVERY DAY WITH FOOD (Patient taking differently: Take 20 mEq by mouth daily. ) 90 tablet 1  . Melatonin 2.5 MG CAPS Take 2.5 mg by mouth at bedtime as needed (sleep).     . mirtazapine (REMERON) 15 MG tablet TAKE 1 TABLET BY MOUTH EVERYDAY AT BEDTIME 30 tablet 2  . Multiple Vitamin (MULTIVITAMIN) tablet Take 1 tablet by mouth daily.    . niacin (NIASPAN) 500 MG CR tablet TAKE 1 TABLET BY MOUTH EVERY DAY IN THE EVENING 120 tablet 2  . OVER THE COUNTER MEDICATION Take 1 tablet by mouth daily.    . pantoprazole (PROTONIX) 40 MG tablet Take 1 tablet (40 mg total) by mouth 2 (two) times daily. 90 tablet 1  . Probiotic Product (PROBIOTIC PO) Take by mouth.    . valACYclovir (VALTREX) 500 MG tablet TAKE 1 TABLET BY MOUTH EVERY DAY 90 tablet 2  . VENTOLIN HFA 108 (90 Base) MCG/ACT inhaler INHALE 2 PUFFS INTO THE LUNGS EVERY 6 HOURS AS NEEDED FOR WHEEZING OR SHORTNESS OF BREATH. (Patient taking differently: Inhale 2 puffs into the lungs every 6 (six) hours as needed for wheezing or  shortness of breath. ) 18 Inhaler 2  . White Petrolatum-Mineral Oil (SYSTANE NIGHTTIME OP) Place 1 drop into both eyes at bedtime as needed (for dry eyes).      No current facility-administered medications for this visit.     SURGICAL HISTORY:  Past Surgical History:  Procedure Laterality Date  . BIOPSY THYROID    . CATARACT EXTRACTION     left eye  . CATARACT EXTRACTION W/ INTRAOCULAR LENS IMPLANT     RIGHT  . CESAREAN SECTION    . EYE SURGERY    . MASS EXCISION N/A 12/13/2014   Procedure: EXCISION OF VOCAL CORD POLYPS;  Surgeon: Jerrell Belfast, MD;  Location: [facility];  Service: ENT;  Laterality: N/A;  . MICROLARYNGOSCOPY N/A  12/13/2014   Procedure: MICROLARYNGOSCOPY;  Surgeon: Jerrell Belfast, MD;  Location: [facility];  Service: ENT;  Laterality: N/A;  . TUBAL LIGATION    . VIDEO ASSISTED THORACOSCOPY (VATS)/ LOBECTOMY Left 07/04/2015   Procedure: VIDEO ASSISTED THORACOSCOPY (VATS) LEFT UPPER LOBECTOMY;  Surgeon: Melrose Nakayama, MD;  Location: [facility];  Service: Thoracic;  Laterality: Left;  Marland Kitchen VIDEO BRONCHOSCOPY WITH INSERTION OF INTERBRONCHIAL VALVE (IBV) N/A 07/11/2015   Procedure: VIDEO BRONCHOSCOPY WITH INSERTION OF INTERBRONCHIAL VALVE (IBV);  Surgeon: Melrose Nakayama, MD;  Location: [facility];  Service: Thoracic;  Laterality: N/A;  . VIDEO BRONCHOSCOPY WITH INSERTION OF INTERBRONCHIAL VALVE (IBV) N/A 09/10/2015   Procedure: VIDEO BRONCHOSCOPY WITH REMOVAL OF INTERBRONCHIAL VALVES;  Surgeon: Melrose Nakayama, MD;  Location: [facility];  Service: Thoracic;  Laterality: N/A;    REVIEW OF SYSTEMS:  A comprehensive review of systems was negative except for: Respiratory: positive for dyspnea on exertion   PHYSICAL EXAMINATION: General appearance: alert, cooperative and no distress Head: Normocephalic, without obvious abnormality, atraumatic Neck: no adenopathy, no JVD, supple, symmetrical, trachea midline and thyroid not enlarged, symmetric, no tenderness/mass/nodules Lymph nodes: Cervical, supraclavicular, and axillary nodes normal. Resp: clear to auscultation bilaterally Back: symmetric, no curvature. ROM normal. No CVA tenderness. Cardio: regular rate and rhythm, S1, S2 normal, no murmur, click, rub or gallop GI: soft, non-tender; bowel sounds normal; no masses,  no organomegaly Extremities: extremities normal, atraumatic, no cyanosis or edema  ECOG PERFORMANCE STATUS: 1 - Symptomatic but completely ambulatory  Blood pressure (!) 142/67, pulse 65, temperature 98.5 F (36.9 C), temperature source Oral, resp. rate 20, height 5\' 2"  (1.575 m), weight 157 lb 12.8 oz (71.6 kg), SpO2 95 %.  LABORATORY DATA: Lab  Results  Component Value Date   WBC 6.4 02/19/2019   HGB 14.3 02/19/2019   HCT 44.8 02/19/2019   MCV 97.8 02/19/2019   PLT 248 02/19/2019      Chemistry      Component Value Date/Time   NA 144 02/19/2019 1050   NA 143 02/15/2019 1439   NA 142 06/20/2017 1502   K 4.0 02/19/2019 1050   K 4.0 06/20/2017 1502   CL 105 02/19/2019 1050   CO2 29 02/19/2019 1050   CO2 29 06/20/2017 1502   BUN 8 02/19/2019 1050   BUN 10 02/15/2019 1439   BUN 16.0 06/20/2017 1502   CREATININE 1.21 (H) 02/19/2019 1050   CREATININE 1.2 (H) 06/20/2017 1502   GLU 108 12/08/2017      Component Value Date/Time   CALCIUM 9.8 02/19/2019 1050   CALCIUM 9.8 06/20/2017 1502   ALKPHOS 95 02/19/2019 1050   ALKPHOS 74 06/20/2017 1502   AST 32 02/19/2019 1050   AST 35 (H) 06/20/2017 1502  ALT 30 02/19/2019 1050   ALT 30 06/20/2017 1502   BILITOT 0.8 02/19/2019 1050   BILITOT 0.94 06/20/2017 1502       RADIOGRAPHIC STUDIES: Ct Chest W Contrast  Result Date: 02/19/2019 CLINICAL DATA:  Patient with history of lung cancer.  Chronic cough. EXAM: CT CHEST WITH CONTRAST TECHNIQUE: Multidetector CT imaging of the chest was performed during intravenous contrast administration. CONTRAST:  9mL OMNIPAQUE IOHEXOL 300 MG/ML  SOLN COMPARISON:  Chest CT 12/22/2017 FINDINGS: Cardiovascular: Heart is mildly enlarged. Trace fluid superior pericardial recess. Thoracic aortic vascular calcifications. Coronary arterial vascular calcifications. Mediastinum/Nodes: No enlarged axillary, mediastinal or hilar lymphadenopathy. Normal appearance of the esophagus. Lungs/Pleura: Postsurgical changes compatible with left upper lobectomy. Stable left hilar postsurgical changes. Unchanged subpleural distortion and nodularity within the left lower lobe measuring 2.1 x 0.7 cm (image 93; series 7), previously 2.0 x 0.8 cm. Interval development of a 1.5 x 1.3 cm nodular area of consolidation right lower lobe (image 71; series 7). Apical  emphysematous changes. No pleural effusion or pneumothorax. Upper Abdomen: No acute process. Musculoskeletal: No aggressive or acute appearing osseous lesions. IMPRESSION: 1. Interval development of a focal nodular area of consolidation within the subpleural right lower lobe which may be infectious/inflammatory in etiology. Malignant etiology not excluded. Recommend attention on short-term follow-up chest CT. 2. Otherwise stable appearance of the left lung. 3. Aortic Atherosclerosis (ICD10-I70.0) and Emphysema (ICD10-J43.9). Electronically Signed   By: Lovey Newcomer M.D.   On: 02/19/2019 14:18    ASSESSMENT AND PLAN:  This is a very pleasant 78 years old African-American female with a stage IB non-small cell lung cancer status post right upper lobectomy with lymph node dissection in September 2016. The patient is currently on observation and she is doing fine. She had repeat CT scan of the chest performed recently.  I personally and independently reviewed the scan images and discussed the results with the patient today.  Unfortunately the scan showed development of focal nodular area of consolidation within the subpleural right lower lobe suspicious to be inflammatory in origin but close observation was recommended. I recommended for the patient to have repeat CT scan of the chest in 3 months for further evaluation and to rule out any malignancy. She was advised to call immediately if she has any concerning symptoms in the interval. The patient voices understanding of current disease status and treatment options and is in agreement with the current care plan. All questions were answered. The patient knows to call the clinic with any problems, questions or concerns. We can certainly see the patient much sooner if necessary. I spent 10 minutes counseling the patient face to face. The total time spent in the appointment was 15 minutes.  Disclaimer: This note was dictated with voice recognition software.  Similar sounding words can inadvertently be transcribed and may not be corrected upon review.

## 2019-02-22 ENCOUNTER — Other Ambulatory Visit: Payer: Self-pay

## 2019-02-22 DIAGNOSIS — H35363 Drusen (degenerative) of macula, bilateral: Secondary | ICD-10-CM | POA: Diagnosis not present

## 2019-02-22 DIAGNOSIS — Z961 Presence of intraocular lens: Secondary | ICD-10-CM | POA: Diagnosis not present

## 2019-02-22 DIAGNOSIS — H04123 Dry eye syndrome of bilateral lacrimal glands: Secondary | ICD-10-CM | POA: Diagnosis not present

## 2019-02-22 DIAGNOSIS — H35033 Hypertensive retinopathy, bilateral: Secondary | ICD-10-CM | POA: Diagnosis not present

## 2019-02-22 LAB — HM DIABETES EYE EXAM

## 2019-02-22 MED ORDER — CALCIUM CARBONATE-VITAMIN D 600-400 MG-UNIT PO TABS: 1.0000 | ORAL_TABLET | Freq: Every day | ORAL | Status: AC

## 2019-02-23 ENCOUNTER — Telehealth: Payer: Self-pay | Admitting: Internal Medicine

## 2019-02-23 NOTE — Telephone Encounter (Signed)
Called and spoke with patient. Confirmed dates and times of appt

## 2019-02-26 ENCOUNTER — Ambulatory Visit (INDEPENDENT_AMBULATORY_CARE_PROVIDER_SITE_OTHER): Payer: Medicare HMO | Admitting: Nurse Practitioner

## 2019-02-26 ENCOUNTER — Other Ambulatory Visit: Payer: Self-pay

## 2019-02-26 ENCOUNTER — Encounter: Payer: Self-pay | Admitting: Nurse Practitioner

## 2019-02-26 DIAGNOSIS — J441 Chronic obstructive pulmonary disease with (acute) exacerbation: Secondary | ICD-10-CM

## 2019-02-26 MED ORDER — PREDNISONE 10 MG PO TABS: ORAL_TABLET | ORAL | 0 refills | Status: DC

## 2019-02-26 MED ORDER — DOXYCYCLINE HYCLATE 100 MG PO TABS: 100.0000 mg | ORAL_TABLET | Freq: Two times a day (BID) | ORAL | 0 refills | Status: DC

## 2019-02-26 NOTE — Assessment & Plan Note (Addendum)
She presents today for 58-month follow-up.  She was last seen by Dr. Elsworth Soho on 05/31/2018.  She states that over the past couple months she has become more short of breath with exertion.  She is compliant with Trelegy.  She had a follow-up CT recently which did show a new nodular density in right lower lobe.  Cannot exclude infectious/inflammation.  Has followed up with oncology and they have ordered a repeat scan in 3 months.  Patient was walked in office today and sats remained above 90% on room air. Will order antibiotic and prednisone based on recent CT scan.  Patient Instructions  Will order doxycycline Will order prednisone taper Continue albuterol as needed Continue trelegy  Continue follow up with oncology  Follow up with Dr. Elsworth Soho in 1 month or sooner if needed

## 2019-02-26 NOTE — Progress Notes (Signed)
@Patient  ID: [name], female    DOB: Jan 16, 1941, 78 y.o.   MRN: [id number]  Chief Complaint  Patient presents with   COPD    Does not feel the medication is working. Cannot work from one room to the other without feeling out of breath.    Referring provider: Glendale Chard, MD  HPI  78 year old former smoker with COPD and lung cancer who is followed by Dr. Elsworth Soho.  Tests: 02/19/19 CT chest>> Interval development of a focal nodular area of consolidation within the subpleural right lower lobe which may be infectious/inflammatory in etiology. Malignant etiology not excluded. Recommend attention on short-term follow-up chest CT. Otherwise stable appearance of the left lung. Emphysema.  12/2017 CT chest >> stable, nodular area of left lung scarring 2016 Pre-op PFT - FEV1 73 %, DLCO 48%  03/2016 FEV1 66%  Spirometry 10/2017 shows an FEV1 at 63% ratio 57 and FVC of 86%.  06/2015 underwent left VATS with left upper lobectomy and mediastinal lymph node dissection. Path showed invasive adenocarcinoma, adenocarcinoma involving the visceral pleura in addition to lymphovascular invasion. The resection margins as well as the dissected lymph nodes were negative for malignancy. She had persistent air leak postop and required endobronchial valves for resolution.  OV 02/26/19 - Follow up She presents today for 17-month follow-up.  She was last seen by Dr. Elsworth Soho on 05/31/2018.  She states that over the past couple months she has become more short of breath with exertion.  She is compliant with Trelegy.  She had a follow-up CT recently which did show a new nodular density in right lower lobe.  Cannot exclude infectious/inflammation.  Has followed up with oncology and they have ordered a repeat scan in 3 months.  Patient was walked in office today and sats remained above 90% on room air.  Denies f/c/s, n/v/d, hemoptysis, PND, leg swelling.   No Known Allergies  Immunization History  Administered  Date(s) Administered   Influenza, High Dose Seasonal PF 06/06/2016, 05/01/2017, 05/31/2018   Influenza-Unspecified 05/01/2017   Pneumococcal Conjugate-13 07/17/2014, 06/15/2018   Pneumococcal Polysaccharide-23 09/07/2011    Past Medical History:  Diagnosis Date   Arthritis    Asthma    COPD (chronic obstructive pulmonary disease) ([facility])    Depression    GERD (gastroesophageal reflux disease)    Hyperlipidemia    Hypertension 06/15/2016   left lung ca dx'd 2016   Thyroid nodule     Tobacco History: Social History   Tobacco Use  Smoking Status Former Smoker   Packs/day: 1.00   Years: 56.00   Pack years: 56.00   Types: Cigarettes   Quit date: 06/22/2015   Years since quitting: 3.6  Smokeless Tobacco Never Used   Counseling given: Not Answered   Outpatient Encounter Medications as of 02/26/2019  Medication Sig   aspirin 81 MG tablet Take 81 mg by mouth every evening.    Calcium Carbonate-Vitamin D 600-400 MG-UNIT tablet Take 1 tablet by mouth daily. (Patient taking differently: Take 1 tablet by mouth daily. 600 mg of calcium and 500 units of vitd)   co-enzyme Q-10 30 MG capsule Take 30 mg by mouth daily.   cyclobenzaprine (FLEXERIL) 5 MG tablet Take 1 tablet (5 mg total) by mouth 3 (three) times daily as needed for muscle spasms.   cycloSPORINE (RESTASIS) 0.05 % ophthalmic emulsion 1 drop 2 (two) times daily.   donepezil (ARICEPT) 10 MG tablet TAKE 1 TABLET BY MOUTH EVERY EVENING   ezetimibe (ZETIA) 10 MG tablet  TAKE 1 TABLET BY MOUTH EVERYDAY AT BEDTIME (Patient taking differently: Take 10 mg by mouth at bedtime. )   Fluticasone-Umeclidin-Vilant (TRELEGY ELLIPTA) 100-62.5-25 MCG/INH AEPB Take 1 puff by mouth daily.   KLOR-CON M20 20 MEQ tablet TAKE 1 TABLET BY MOUTH EVERY DAY WITH FOOD (Patient taking differently: Take 20 mEq by mouth daily. )   Melatonin 2.5 MG CAPS Take 2.5 mg by mouth at bedtime as needed (sleep).    mirtazapine (REMERON) 15  MG tablet TAKE 1 TABLET BY MOUTH EVERYDAY AT BEDTIME   Multiple Vitamin (MULTIVITAMIN) tablet Take 1 tablet by mouth daily.   niacin (NIASPAN) 500 MG CR tablet TAKE 1 TABLET BY MOUTH EVERY DAY IN THE EVENING   OVER THE COUNTER MEDICATION Take 1 tablet by mouth daily.   pantoprazole (PROTONIX) 40 MG tablet TAKE 1 TABLET BY MOUTH TWICE A DAY   Probiotic Product (PROBIOTIC PO) Take by mouth.   valACYclovir (VALTREX) 500 MG tablet TAKE 1 TABLET BY MOUTH EVERY DAY   VENTOLIN HFA 108 (90 Base) MCG/ACT inhaler INHALE 2 PUFFS INTO THE LUNGS EVERY 6 HOURS AS NEEDED FOR WHEEZING OR SHORTNESS OF BREATH. (Patient taking differently: Inhale 2 puffs into the lungs every 6 (six) hours as needed for wheezing or shortness of breath. )   White Petrolatum-Mineral Oil (SYSTANE NIGHTTIME OP) Place 1 drop into both eyes at bedtime as needed (for dry eyes).    doxycycline (VIBRA-TABS) 100 MG tablet Take 1 tablet (100 mg total) by mouth 2 (two) times daily.   predniSONE (DELTASONE) 10 MG tablet Take 4 tabs for 2 days, then 3 tabs for 2 days, then 2 tabs for 2 days, then 1 tab for 2 days, then stop   No facility-administered encounter medications on file as of 02/26/2019.      Review of Systems  Review of Systems  Constitutional: Negative.  Negative for chills and fever.  HENT: Negative.   Respiratory: Positive for shortness of breath and wheezing. Negative for cough.   Cardiovascular: Negative.  Negative for chest pain, palpitations and leg swelling.  Gastrointestinal: Negative.   Allergic/Immunologic: Negative.   Neurological: Negative.   Psychiatric/Behavioral: Negative.        Physical Exam  BP 122/78 (BP Location: Left Arm, Patient Position: Sitting, Cuff Size: Normal)    Pulse 72    Temp 98.4 F (36.9 C)    Ht 5\' 3"  (1.6 m)    Wt 156 lb 12.8 oz (71.1 kg)    SpO2 99%    BMI 27.78 kg/m   Wt Readings from Last 5 Encounters:  02/26/19 156 lb 12.8 oz (71.1 kg)  02/21/19 157 lb 12.8 oz (71.6  kg)  02/15/19 157 lb 6.4 oz (71.4 kg)  10/18/18 154 lb 3.2 oz (69.9 kg)  10/02/18 154 lb 6.4 oz (70 kg)     Physical Exam Vitals signs and nursing note reviewed.  Constitutional:      General: She is not in acute distress.    Appearance: She is well-developed.  Cardiovascular:     Rate and Rhythm: Normal rate and regular rhythm.  Pulmonary:     Effort: Pulmonary effort is normal. No respiratory distress.     Breath sounds: Wheezing present.  Musculoskeletal:        General: No swelling.  Neurological:     Mental Status: She is alert and oriented to person, place, and time.     Imaging: Ct Chest W Contrast  Result Date: 02/19/2019 CLINICAL DATA:  Patient with  history of lung cancer.  Chronic cough. EXAM: CT CHEST WITH CONTRAST TECHNIQUE: Multidetector CT imaging of the chest was performed during intravenous contrast administration. CONTRAST:  92mL OMNIPAQUE IOHEXOL 300 MG/ML  SOLN COMPARISON:  Chest CT 12/22/2017 FINDINGS: Cardiovascular: Heart is mildly enlarged. Trace fluid superior pericardial recess. Thoracic aortic vascular calcifications. Coronary arterial vascular calcifications. Mediastinum/Nodes: No enlarged axillary, mediastinal or hilar lymphadenopathy. Normal appearance of the esophagus. Lungs/Pleura: Postsurgical changes compatible with left upper lobectomy. Stable left hilar postsurgical changes. Unchanged subpleural distortion and nodularity within the left lower lobe measuring 2.1 x 0.7 cm (image 93; series 7), previously 2.0 x 0.8 cm. Interval development of a 1.5 x 1.3 cm nodular area of consolidation right lower lobe (image 71; series 7). Apical emphysematous changes. No pleural effusion or pneumothorax. Upper Abdomen: No acute process. Musculoskeletal: No aggressive or acute appearing osseous lesions. IMPRESSION: 1. Interval development of a focal nodular area of consolidation within the subpleural right lower lobe which may be infectious/inflammatory in etiology.  Malignant etiology not excluded. Recommend attention on short-term follow-up chest CT. 2. Otherwise stable appearance of the left lung. 3. Aortic Atherosclerosis (ICD10-I70.0) and Emphysema (ICD10-J43.9). Electronically Signed   By: Lovey Newcomer M.D.   On: 02/19/2019 14:18     Assessment & Plan:   COPD (chronic obstructive pulmonary disease) She presents today for 33-month follow-up.  She was last seen by Dr. Elsworth Soho on 05/31/2018.  She states that over the past couple months she has become more short of breath with exertion.  She is compliant with Trelegy.  She had a follow-up CT recently which did show a new nodular density in right lower lobe.  Cannot exclude infectious/inflammation.  Has followed up with oncology and they have ordered a repeat scan in 3 months.  Patient was walked in office today and sats remained above 90% on room air. Will order antibiotic and prednisone based on recent CT scan.  Patient Instructions  Will order doxycycline Will order prednisone taper Continue albuterol as needed Continue trelegy  Continue follow up with oncology  Follow up with Dr. Elsworth Soho in 1 month or sooner if needed       Fenton Foy, NP 02/26/2019

## 2019-02-26 NOTE — Patient Instructions (Addendum)
Will order doxycycline Will order prednisone taper Continue albuterol as needed Continue trelegy  Continue follow up with oncology  Follow up with Dr. Elsworth Soho in 1 month or sooner if needed

## 2019-02-28 ENCOUNTER — Encounter: Payer: Self-pay | Admitting: Nurse Practitioner

## 2019-03-07 ENCOUNTER — Other Ambulatory Visit: Payer: Self-pay | Admitting: Nurse Practitioner

## 2019-03-08 ENCOUNTER — Other Ambulatory Visit: Payer: Self-pay | Admitting: Nurse Practitioner

## 2019-03-28 ENCOUNTER — Encounter: Payer: Self-pay | Admitting: Nurse Practitioner

## 2019-03-28 ENCOUNTER — Ambulatory Visit (INDEPENDENT_AMBULATORY_CARE_PROVIDER_SITE_OTHER): Payer: Medicare HMO | Admitting: Nurse Practitioner

## 2019-03-28 ENCOUNTER — Other Ambulatory Visit: Payer: Self-pay

## 2019-03-28 DIAGNOSIS — J441 Chronic obstructive pulmonary disease with (acute) exacerbation: Secondary | ICD-10-CM

## 2019-03-28 MED ORDER — ALBUTEROL SULFATE HFA 108 (90 BASE) MCG/ACT IN AERS: INHALATION_SPRAY | RESPIRATORY_TRACT | 2 refills | Status: DC

## 2019-03-28 NOTE — Progress Notes (Signed)
@Patient  ID: [name], female    DOB: 1941-08-09, 78 y.o.   MRN: [id number]  Chief Complaint  Patient presents with  . Follow-up    COPD - medications are working. Needs refill of Ventolin.    Referring provider: [name], FNP  HPI 78 year old former smoker with COPD and lung cancer who is followed by Dr. Elsworth Soho.  Tests: 02/19/19 CT chest>> Interval development of a focal nodular area of consolidation within the subpleural right lower lobe which may be infectious/inflammatory in etiology. Malignant etiology not excluded. Recommend attention on short-term follow-up chest CT. Otherwise stable appearance of the left lung. Emphysema.  12/2017 CT chest >>stable, nodular area of left lung scarring 2016Pre-op PFT - FEV1 73 %, DLCO 48%  03/2016 FEV1 66%  Spirometry2/2019shows an FEV1 at 63% ratio 57 and FVC of 86%.  06/2015 underwent left VATS with left upper lobectomy and mediastinal lymph node dissection. Path showed invasive adenocarcinoma, adenocarcinoma involving the visceral pleura in addition to lymphovascular invasion. The resection margins as well as the dissected lymph nodes were negative for malignancy. She had persistent air leak postop and required endobronchial valves for resolution.  OV 03/28/19 - Follow up Patient presents today for follow-up visit.  She states that she has been stable since her last visit.  She states that she does need a refill on her albuterol.  Patient is scheduled for a follow-up CT scan in September with a follow-up with oncology.  CT scan in June showed new nodular density in the right lower lobe cannot exclude infectious/inflammation.  Patient was treated with doxycycline and prednisone.  Patient is compliant with Trelegy.  States that she is improved since June. Denies f/c/s, n/v/d, hemoptysis, PND, leg swelling.     No Known Allergies  Immunization History  Administered Date(s) Administered  . Influenza, High Dose Seasonal PF  06/06/2016, 05/01/2017, 05/31/2018  . Influenza-Unspecified 05/01/2017  . Pneumococcal Conjugate-13 07/17/2014, 06/15/2018  . Pneumococcal Polysaccharide-23 09/07/2011    Past Medical History:  Diagnosis Date  . Arthritis   . Asthma   . COPD (chronic obstructive pulmonary disease) ([place])   . Depression   . GERD (gastroesophageal reflux disease)   . Hyperlipidemia   . Hypertension 06/15/2016  . left lung ca dx'd 2016  . Thyroid nodule     Tobacco History: Social History   Tobacco Use  Smoking Status Former Smoker  . Packs/day: 1.00  . Years: 56.00  . Pack years: 56.00  . Types: Cigarettes  . Quit date: 06/22/2015  . Years since quitting: 3.7  Smokeless Tobacco Never Used   Counseling given: Yes   Outpatient Encounter Medications as of 03/28/2019  Medication Sig  . albuterol (VENTOLIN HFA) 108 (90 Base) MCG/ACT inhaler INHALE 2 PUFFS INTO THE LUNGS EVERY 6 HOURS AS NEEDED FOR WHEEZING OR SHORTNESS OF BREATH.  Marland Kitchen aspirin 81 MG tablet Take 81 mg by mouth every evening.   . Calcium Carbonate-Vitamin D 600-400 MG-UNIT tablet Take 1 tablet by mouth daily. (Patient taking differently: Take 1 tablet by mouth daily. 600 mg of calcium and 500 units of vitd)  . co-enzyme Q-10 30 MG capsule Take 30 mg by mouth daily.  . cyclobenzaprine (FLEXERIL) 5 MG tablet Take 1 tablet (5 mg total) by mouth 3 (three) times daily as needed for muscle spasms.  . cycloSPORINE (RESTASIS) 0.05 % ophthalmic emulsion 1 drop 2 (two) times daily.  Marland Kitchen donepezil (ARICEPT) 10 MG tablet TAKE 1 TABLET BY MOUTH EVERY EVENING  . ezetimibe (ZETIA)  10 MG tablet TAKE 1 TABLET BY MOUTH EVERYDAY AT BEDTIME  . Fluticasone-Umeclidin-Vilant (TRELEGY ELLIPTA) 100-62.5-25 MCG/INH AEPB Take 1 puff by mouth daily.  Marland Kitchen KLOR-CON M20 20 MEQ tablet TAKE 1 TABLET BY MOUTH EVERY DAY WITH FOOD  . Melatonin 2.5 MG CAPS Take 2.5 mg by mouth at bedtime as needed (sleep).   . mirtazapine (REMERON) 15 MG tablet TAKE 1 TABLET BY MOUTH  EVERYDAY AT BEDTIME  . Multiple Vitamin (MULTIVITAMIN) tablet Take 1 tablet by mouth daily.  . niacin (NIASPAN) 500 MG CR tablet TAKE 1 TABLET BY MOUTH EVERY DAY IN THE EVENING  . OVER THE COUNTER MEDICATION Take 1 tablet by mouth daily.  . pantoprazole (PROTONIX) 40 MG tablet TAKE 1 TABLET BY MOUTH TWICE A DAY  . Probiotic Product (PROBIOTIC PO) Take by mouth.  . valACYclovir (VALTREX) 500 MG tablet TAKE 1 TABLET BY MOUTH EVERY DAY  . White Petrolatum-Mineral Oil (SYSTANE NIGHTTIME OP) Place 1 drop into both eyes at bedtime as needed (for dry eyes).   . [DISCONTINUED] VENTOLIN HFA 108 (90 Base) MCG/ACT inhaler INHALE 2 PUFFS INTO THE LUNGS EVERY 6 HOURS AS NEEDED FOR WHEEZING OR SHORTNESS OF BREATH. (Patient taking differently: Inhale 2 puffs into the lungs every 6 (six) hours as needed for wheezing or shortness of breath. )  . [DISCONTINUED] doxycycline (VIBRA-TABS) 100 MG tablet Take 1 tablet (100 mg total) by mouth 2 (two) times daily. (Patient not taking: Reported on 03/28/2019)  . [DISCONTINUED] predniSONE (DELTASONE) 10 MG tablet Take 4 tabs for 2 days, then 3 tabs for 2 days, then 2 tabs for 2 days, then 1 tab for 2 days, then stop (Patient not taking: Reported on 03/28/2019)   No facility-administered encounter medications on file as of 03/28/2019.      Review of Systems  Review of Systems  Constitutional: Negative.  Negative for chills and fever.  HENT: Negative.   Respiratory: Positive for shortness of breath. Negative for cough and wheezing.   Cardiovascular: Negative.  Negative for chest pain, palpitations and leg swelling.  Gastrointestinal: Negative.   Allergic/Immunologic: Negative.   Neurological: Negative.   Psychiatric/Behavioral: Negative.        Physical Exam  BP 124/70 (BP Location: Left Arm, Patient Position: Sitting, Cuff Size: Normal)   Pulse 91   Temp 97.7 F (36.5 C)   Ht 5\' 3"  (1.6 m)   Wt 155 lb (70.3 kg)   SpO2 96%   BMI 27.46 kg/m   Wt Readings  from Last 5 Encounters:  03/28/19 155 lb (70.3 kg)  02/26/19 156 lb 12.8 oz (71.1 kg)  02/21/19 157 lb 12.8 oz (71.6 kg)  02/15/19 157 lb 6.4 oz (71.4 kg)  10/18/18 154 lb 3.2 oz (69.9 kg)     Physical Exam Vitals signs and nursing note reviewed.  Constitutional:      General: She is not in acute distress.    Appearance: She is well-developed.  Cardiovascular:     Rate and Rhythm: Normal rate and regular rhythm.  Pulmonary:     Effort: Pulmonary effort is normal. No respiratory distress.     Breath sounds: No wheezing or rhonchi.  Musculoskeletal:        General: No swelling.  Neurological:     Mental Status: She is alert and oriented to person, place, and time.       Assessment & Plan:   COPD (chronic obstructive pulmonary disease) Patient presents today for follow-up visit.  She states that she has been  stable since her last visit.  She states that she does need a refill on her albuterol.  Patient is scheduled for a follow-up CT scan in September with a follow-up with oncology.  CT scan in June showed new nodular density in the right lower lobe cannot exclude infectious/inflammation.  Patient was treated with doxycycline and prednisone.  Patient is compliant with Trelegy.  States that she is improved since June.  Patient Instructions  Continue albuterol as needed Continue trelegy  Continue follow up with oncology  Follow up with Dr. Elsworth Soho in 3-4 months or sooner if needed       Fenton Foy, NP 03/28/2019

## 2019-03-28 NOTE — Patient Instructions (Signed)
Continue albuterol as needed Continue trelegy  Continue follow up with oncology  Follow up with Dr. Elsworth Soho in 3-4 months or sooner if needed

## 2019-03-28 NOTE — Assessment & Plan Note (Signed)
Patient presents today for follow-up visit.  She states that she has been stable since her last visit.  She states that she does need a refill on her albuterol.  Patient is scheduled for a follow-up CT scan in September with a follow-up with oncology.  CT scan in June showed new nodular density in the right lower lobe cannot exclude infectious/inflammation.  Patient was treated with doxycycline and prednisone.  Patient is compliant with Trelegy.  States that she is improved since June.  Patient Instructions  Continue albuterol as needed Continue trelegy  Continue follow up with oncology  Follow up with Dr. Elsworth Soho in 3-4 months or sooner if needed

## 2019-05-01 ENCOUNTER — Telehealth: Payer: Self-pay | Admitting: Nurse Practitioner

## 2019-05-01 NOTE — Telephone Encounter (Signed)
I left a message asking the patient to call me at [phone] to change telephone visit to virtual or in office. VDM (DD)

## 2019-05-01 NOTE — Telephone Encounter (Signed)
I called the patient to change telephone visit to office visit, but I was told to give her a call back around 11:00. VDM (DD)

## 2019-05-02 ENCOUNTER — Other Ambulatory Visit: Payer: Self-pay | Admitting: Pulmonary Disease

## 2019-05-03 ENCOUNTER — Ambulatory Visit (INDEPENDENT_AMBULATORY_CARE_PROVIDER_SITE_OTHER): Payer: Medicare HMO

## 2019-05-03 ENCOUNTER — Ambulatory Visit: Payer: Medicare HMO

## 2019-05-03 ENCOUNTER — Other Ambulatory Visit: Payer: Self-pay

## 2019-05-03 VITALS — BP 118/64 | HR 99 | Temp 98.9°F | Ht 63.0 in | Wt 153.8 lb

## 2019-05-03 DIAGNOSIS — Z0000 Encounter for general adult medical examination without abnormal findings: Secondary | ICD-10-CM

## 2019-05-03 NOTE — Progress Notes (Signed)
Subjective:   [name] is a 78 y.o. female who presents for Medicare Annual (Subsequent) preventive examination.  Review of Systems:  n/a Cardiac Risk Factors include: advanced age (>52men, >51 women);hypertension     Objective:     Vitals: BP 118/64 (BP Location: Left Arm, Patient Position: Sitting, Cuff Size: Normal)   Pulse 99   Temp 98.9 F (37.2 C) (Oral)   Ht 5\' 3"  (1.6 m)   Wt 153 lb 12.8 oz (69.8 kg)   BMI 27.24 kg/m   Body mass index is 27.24 kg/m.  Advanced Directives 05/03/2019 02/21/2019 09/25/2018 06/14/2018 08/21/2017 06/04/2016 11/24/2015  Does Patient Have a Medical Advance Directive? No No No No No No No  Type of Advance Directive - - - - - - -  Does patient want to make changes to medical advance directive? - - - - - No - Patient declined -  Would patient like information on creating a medical advance directive? - No - Patient declined No - Patient declined Yes (Inpatient - patient defers creating a medical advance directive at this time) - - -    Tobacco Social History   Tobacco Use  Smoking Status Former Smoker  . Packs/day: 1.00  . Years: 56.00  . Pack years: 56.00  . Types: Cigarettes  . Quit date: 06/22/2015  . Years since quitting: 3.8  Smokeless Tobacco Never Used     Counseling given: Not Answered   Clinical Intake:  Pre-visit preparation completed: Yes  Pain : No/denies pain     Nutritional Status: BMI 25 -29 Overweight Nutritional Risks: None Diabetes: No  How often do you need to have someone help you when you read instructions, pamphlets, or other written materials from your doctor or pharmacy?: 1 - Never What is the last grade level you completed in school?: 12th grade  Interpreter Needed?: No  Information entered by :: NAllen LPN  Past Medical History:  Diagnosis Date  . Arthritis   . Asthma   . COPD (chronic obstructive pulmonary disease) ([place])   . Depression   . GERD (gastroesophageal reflux disease)   .  Hyperlipidemia   . Hypertension 06/15/2016  . left lung ca dx'd 2016  . Thyroid nodule    Past Surgical History:  Procedure Laterality Date  . BIOPSY THYROID    . CATARACT EXTRACTION     left eye  . CATARACT EXTRACTION W/ INTRAOCULAR LENS IMPLANT     RIGHT  . CESAREAN SECTION    . EYE SURGERY    . MASS EXCISION N/A 12/13/2014   Procedure: EXCISION OF VOCAL CORD POLYPS;  Surgeon: Jerrell Belfast, MD;  Location: [place];  Service: ENT;  Laterality: N/A;  . MICROLARYNGOSCOPY N/A 12/13/2014   Procedure: MICROLARYNGOSCOPY;  Surgeon: Jerrell Belfast, MD;  Location: [place];  Service: ENT;  Laterality: N/A;  . TUBAL LIGATION    . VIDEO ASSISTED THORACOSCOPY (VATS)/ LOBECTOMY Left 07/04/2015   Procedure: VIDEO ASSISTED THORACOSCOPY (VATS) LEFT UPPER LOBECTOMY;  Surgeon: Melrose Nakayama, MD;  Location: [place];  Service: Thoracic;  Laterality: Left;  Marland Kitchen VIDEO BRONCHOSCOPY WITH INSERTION OF INTERBRONCHIAL VALVE (IBV) N/A 07/11/2015   Procedure: VIDEO BRONCHOSCOPY WITH INSERTION OF INTERBRONCHIAL VALVE (IBV);  Surgeon: Melrose Nakayama, MD;  Location: [place];  Service: Thoracic;  Laterality: N/A;  . VIDEO BRONCHOSCOPY WITH INSERTION OF INTERBRONCHIAL VALVE (IBV) N/A 09/10/2015   Procedure: VIDEO BRONCHOSCOPY WITH REMOVAL OF INTERBRONCHIAL VALVES;  Surgeon: Melrose Nakayama, MD;  Location: [place];  Service: Thoracic;  Laterality: N/A;   Family History  Problem Relation Age of Onset  . Renal cancer Brother   . Diabetes Mother   . Hypertension Mother   . Renal Disease Mother   . Healthy Father    Social History   Socioeconomic History  . Marital status: Widowed    Spouse name: Not on file  . Number of children: 5  . Years of education: Not on file  . Highest education level: Not on file  Occupational History  . Occupation: retired  Scientific laboratory technician  . Financial resource strain: Not hard at all  . Food insecurity    Worry: Never true    Inability: Never true  . Transportation needs     Medical: No    Non-medical: No  Tobacco Use  . Smoking status: Former Smoker    Packs/day: 1.00    Years: 56.00    Pack years: 56.00    Types: Cigarettes    Quit date: 06/22/2015    Years since quitting: 3.8  . Smokeless tobacco: Never Used  Substance and Sexual Activity  . Alcohol use: No    Alcohol/week: 0.0 standard drinks  . Drug use: No  . Sexual activity: Not Currently  Lifestyle  . Physical activity    Days per week: 7 days    Minutes per session: 20 min  . Stress: Not at all  Relationships  . Social Herbalist on phone: Not on file    Gets together: Not on file    Attends religious service: Not on file    Active member of club or organization: Not on file    Attends meetings of clubs or organizations: Not on file    Relationship status: Not on file  Other Topics Concern  . Not on file  Social History Narrative  . Not on file    Outpatient Encounter Medications as of 05/03/2019  Medication Sig  . albuterol (VENTOLIN HFA) 108 (90 Base) MCG/ACT inhaler INHALE 2 PUFFS INTO THE LUNGS EVERY 6 HOURS AS NEEDED FOR WHEEZING OR SHORTNESS OF BREATH.  Marland Kitchen aspirin 81 MG tablet Take 81 mg by mouth every evening.   . Calcium Carbonate-Vitamin D 600-400 MG-UNIT tablet Take 1 tablet by mouth daily. (Patient taking differently: Take 1 tablet by mouth daily. 600 mg of calcium and 500 units of vitd)  . co-enzyme Q-10 30 MG capsule Take 30 mg by mouth daily.  . cyclobenzaprine (FLEXERIL) 5 MG tablet Take 1 tablet (5 mg total) by mouth 3 (three) times daily as needed for muscle spasms.  . cycloSPORINE (RESTASIS) 0.05 % ophthalmic emulsion 1 drop 2 (two) times daily.  Marland Kitchen donepezil (ARICEPT) 10 MG tablet TAKE 1 TABLET BY MOUTH EVERY EVENING  . ezetimibe (ZETIA) 10 MG tablet TAKE 1 TABLET BY MOUTH EVERYDAY AT BEDTIME  . KLOR-CON M20 20 MEQ tablet TAKE 1 TABLET BY MOUTH EVERY DAY WITH FOOD  . Melatonin 2.5 MG CAPS Take 2.5 mg by mouth at bedtime as needed (sleep).   . mirtazapine  (REMERON) 15 MG tablet TAKE 1 TABLET BY MOUTH EVERYDAY AT BEDTIME  . Multiple Vitamin (MULTIVITAMIN) tablet Take 1 tablet by mouth daily.  . niacin (NIASPAN) 500 MG CR tablet TAKE 1 TABLET BY MOUTH EVERY DAY IN THE EVENING  . OVER THE COUNTER MEDICATION Take 1 tablet by mouth daily.  . pantoprazole (PROTONIX) 40 MG tablet TAKE 1 TABLET BY MOUTH TWICE A DAY  . Probiotic Product (PROBIOTIC PO) Take  by mouth.  . TRELEGY ELLIPTA 100-62.5-25 MCG/INH AEPB INHALE 1 PUFF BY MOUTH DAILY  . valACYclovir (VALTREX) 500 MG tablet TAKE 1 TABLET BY MOUTH EVERY DAY  . White Petrolatum-Mineral Oil (SYSTANE NIGHTTIME OP) Place 1 drop into both eyes at bedtime as needed (for dry eyes).    No facility-administered encounter medications on file as of 05/03/2019.     Activities of Daily Living In your present state of health, do you have any difficulty performing the following activities: 05/03/2019 06/14/2018  Hearing? N N  Vision? N N  Difficulty concentrating or making decisions? N N  Walking or climbing stairs? N Y  Comment - Has COPD so gets winded before getting all the way to the top.  Dressing or bathing? N N  Doing errands, shopping? N N  Preparing Food and eating ? N N  Using the Toilet? N N  In the past six months, have you accidently leaked urine? Y Y  Comment sometimes with a hard cough Stress incontinence at times  Do you have problems with loss of bowel control? N N  Managing your Medications? N N  Managing your Finances? N -  Housekeeping or managing your Housekeeping? N N  Some recent data might be hidden    Patient Care Team: [name], FNP as PCP - General (General Practice) Monna Fam, MD as Consulting Physician (Ophthalmology) Rigoberto Noel, MD as Consulting Physician (Pulmonary Disease) Melrose Nakayama, MD as Consulting Physician (Cardiothoracic Surgery) Curt Bears, MD as Consulting Physician (Oncology)    Assessment:   This is a routine wellness  examination for [facility].  Exercise Activities and Dietary recommendations Current Exercise Habits: Home exercise routine, Type of exercise: walking, Time (Minutes): 20, Frequency (Times/Week): 7, Weekly Exercise (Minutes/Week): 140  Goals    . Have 2 meals a day (pt-stated)     Wants to eat more healthy. Has been nibbling through out the day. Has only been eating 1 meal a day twice or three times a week.    . Patient Stated     05/03/2019, not to have any new health issues       Fall Risk Fall Risk  05/03/2019 02/15/2019 10/18/2018 10/02/2018 08/24/2018  Falls in the past year? 0 0 0 0 0  Risk for fall due to : Medication side effect - - - -  Follow up Falls evaluation completed;Education provided;Falls prevention discussed - - - -   Is the patient's home free of loose throw rugs in walkways, pet beds, electrical cords, etc?   yes      Grab bars in the bathroom? yes      Handrails on the stairs?   yes      Adequate lighting?   yes  Timed Get Up and Go performed: n/a  Depression Screen PHQ 2/9 Scores 05/03/2019 02/15/2019 10/18/2018 10/02/2018  PHQ - 2 Score 0 0 0 0  PHQ- 9 Score 3 - - -     Cognitive Function     6CIT Screen 05/03/2019 06/14/2018  What Year? 0 points 0 points  What month? 0 points 0 points  What time? 0 points 0 points  Count back from 20 0 points 0 points  Months in reverse 0 points 0 points  Repeat phrase 0 points 0 points  Total Score 0 0    Immunization History  Administered Date(s) Administered  . Influenza, High Dose Seasonal PF 06/06/2016, 05/01/2017, 05/31/2018  . Influenza-Unspecified 05/01/2017  . Pneumococcal Conjugate-13 07/17/2014, 06/15/2018  .  Pneumococcal Polysaccharide-23 09/07/2011    Qualifies for Shingles Vaccine? yes  Screening Tests Health Maintenance  Topic Date Due  . DEXA SCAN  04/12/2006  . INFLUENZA VACCINE  04/07/2019  . URINE MICROALBUMIN  02/15/2020  . TETANUS/TDAP  10/30/2022  . PNA vac Low Risk Adult  Completed     Cancer Screenings: Lung: Low Dose CT Chest recommended if Age 33-80 years, 30 pack-year currently smoking OR have quit w/in 15years. Patient does not qualify. Breast:  Up to date on Mammogram? Yes   Up to date of Bone Density/Dexa? Yes Colorectal: not required  Additional Screenings: : Hepatitis C Screening: n/a     Plan:    Patient does not want to have any new health issues.   I have personally reviewed and noted the following in the patient's chart:   . Medical and social history . Use of alcohol, tobacco or illicit drugs  . Current medications and supplements . Functional ability and status . Nutritional status . Physical activity . Advanced directives . List of other physicians . Hospitalizations, surgeries, and ER visits in previous 12 months . Vitals . Screenings to include cognitive, depression, and falls . Referrals and appointments  In addition, I have reviewed and discussed with patient certain preventive protocols, quality metrics, and best practice recommendations. A written personalized care plan for preventive services as well as general preventive health recommendations were provided to patient.     Kellie Simmering, LPN  [id number]

## 2019-05-03 NOTE — Patient Instructions (Signed)
[name] , Thank you for taking time to come for your Medicare Wellness Visit. I appreciate your ongoing commitment to your health goals. Please review the following plan we discussed and let me know if I can assist you in the future.   Screening recommendations/referrals: Colonoscopy: not required Mammogram: 06/2017 Bone Density: 06/2015 Recommended yearly ophthalmology/optometry visit for glaucoma screening and checkup Recommended yearly dental visit for hygiene and checkup  Vaccinations: Influenza vaccine: 05/2018 Pneumococcal vaccine: 06/2018 Tdap vaccine: 10/2012 Shingles vaccine: discussed    Advanced directives: Advance directive discussed with you today. Even though you declined this today please call our office should you change your mind and we can give you the proper paperwork for you to fill out.   Conditions/risks identified: overweight  Next appointment: 06/28/2019 at 10:30   Preventive Care 23 Years and Older, Female Preventive care refers to lifestyle choices and visits with your health care provider that can promote health and wellness. What does preventive care include?  A yearly physical exam. This is also called an annual well check.  Dental exams once or twice a year.  Routine eye exams. Ask your health care provider how often you should have your eyes checked.  Personal lifestyle choices, including:  Daily care of your teeth and gums.  Regular physical activity.  Eating a healthy diet.  Avoiding tobacco and drug use.  Limiting alcohol use.  Practicing safe sex.  Taking low-dose aspirin every day.  Taking vitamin and mineral supplements as recommended by your health care provider. What happens during an annual well check? The services and screenings done by your health care provider during your annual well check will depend on your age, overall health, lifestyle risk factors, and family history of disease. Counseling  Your health care  provider may ask you questions about your:  Alcohol use.  Tobacco use.  Drug use.  Emotional well-being.  Home and relationship well-being.  Sexual activity.  Eating habits.  History of falls.  Memory and ability to understand (cognition).  Work and work Statistician.  Reproductive health. Screening  You may have the following tests or measurements:  Height, weight, and BMI.  Blood pressure.  Lipid and cholesterol levels. These may be checked every 5 years, or more frequently if you are over 33 years old.  Skin check.  Lung cancer screening. You may have this screening every year starting at age 27 if you have a 30-pack-year history of smoking and currently smoke or have quit within the past 15 years.  Fecal occult blood test (FOBT) of the stool. You may have this test every year starting at age 56.  Flexible sigmoidoscopy or colonoscopy. You may have a sigmoidoscopy every 5 years or a colonoscopy every 10 years starting at age 10.  Hepatitis C blood test.  Hepatitis B blood test.  Sexually transmitted disease (STD) testing.  Diabetes screening. This is done by checking your blood sugar (glucose) after you have not eaten for a while (fasting). You may have this done every 1-3 years.  Bone density scan. This is done to screen for osteoporosis. You may have this done starting at age 17.  Mammogram. This may be done every 1-2 years. Talk to your health care provider about how often you should have regular mammograms. Talk with your health care provider about your test results, treatment options, and if necessary, the need for more tests. Vaccines  Your health care provider may recommend certain vaccines, such as:  Influenza vaccine. This is recommended  every year.  Tetanus, diphtheria, and acellular pertussis (Tdap, Td) vaccine. You may need a Td booster every 10 years.  Zoster vaccine. You may need this after age 78.  Pneumococcal 13-valent conjugate (PCV13)  vaccine. One dose is recommended after age 70.  Pneumococcal polysaccharide (PPSV23) vaccine. One dose is recommended after age 55. Talk to your health care provider about which screenings and vaccines you need and how often you need them. This information is not intended to replace advice given to you by your health care provider. Make sure you discuss any questions you have with your health care provider. Document Released: 09/19/2015 Document Revised: 05/12/2016 Document Reviewed: 06/24/2015 Elsevier Interactive Patient Education  2017 [employer] Prevention in the Home Falls can cause injuries. They can happen to people of all ages. There are many things you can do to make your home safe and to help prevent falls. What can I do on the outside of my home?  Regularly fix the edges of walkways and driveways and fix any cracks.  Remove anything that might make you trip as you walk through a door, such as a raised step or threshold.  Trim any bushes or trees on the path to your home.  Use bright outdoor lighting.  Clear any walking paths of anything that might make someone trip, such as rocks or tools.  Regularly check to see if handrails are loose or broken. Make sure that both sides of any steps have handrails.  Any raised decks and porches should have guardrails on the edges.  Have any leaves, snow, or ice cleared regularly.  Use sand or salt on walking paths during winter.  Clean up any spills in your garage right away. This includes oil or grease spills. What can I do in the bathroom?  Use night lights.  Install grab bars by the toilet and in the tub and shower. Do not use towel bars as grab bars.  Use non-skid mats or decals in the tub or shower.  If you need to sit down in the shower, use a plastic, non-slip stool.  Keep the floor dry. Clean up any water that spills on the floor as soon as it happens.  Remove soap buildup in the tub or shower regularly.   Attach bath mats securely with double-sided non-slip rug tape.  Do not have throw rugs and other things on the floor that can make you trip. What can I do in the bedroom?  Use night lights.  Make sure that you have a light by your bed that is easy to reach.  Do not use any sheets or blankets that are too big for your bed. They should not hang down onto the floor.  Have a firm chair that has side arms. You can use this for support while you get dressed.  Do not have throw rugs and other things on the floor that can make you trip. What can I do in the kitchen?  Clean up any spills right away.  Avoid walking on wet floors.  Keep items that you use a lot in easy-to-reach places.  If you need to reach something above you, use a strong step stool that has a grab bar.  Keep electrical cords out of the way.  Do not use floor polish or wax that makes floors slippery. If you must use wax, use non-skid floor wax.  Do not have throw rugs and other things on the floor that can make you trip. What  can I do with my stairs?  Do not leave any items on the stairs.  Make sure that there are handrails on both sides of the stairs and use them. Fix handrails that are broken or loose. Make sure that handrails are as long as the stairways.  Check any carpeting to make sure that it is firmly attached to the stairs. Fix any carpet that is loose or worn.  Avoid having throw rugs at the top or bottom of the stairs. If you do have throw rugs, attach them to the floor with carpet tape.  Make sure that you have a light switch at the top of the stairs and the bottom of the stairs. If you do not have them, ask someone to add them for you. What else can I do to help prevent falls?  Wear shoes that:  Do not have high heels.  Have rubber bottoms.  Are comfortable and fit you well.  Are closed at the toe. Do not wear sandals.  If you use a stepladder:  Make sure that it is fully opened. Do not climb  a closed stepladder.  Make sure that both sides of the stepladder are locked into place.  Ask someone to hold it for you, if possible.  Clearly mark and make sure that you can see:  Any grab bars or handrails.  First and last steps.  Where the edge of each step is.  Use tools that help you move around (mobility aids) if they are needed. These include:  Canes.  Walkers.  Scooters.  Crutches.  Turn on the lights when you go into a dark area. Replace any light bulbs as soon as they burn out.  Set up your furniture so you have a clear path. Avoid moving your furniture around.  If any of your floors are uneven, fix them.  If there are any pets around you, be aware of where they are.  Review your medicines with your doctor. Some medicines can make you feel dizzy. This can increase your chance of falling. Ask your doctor what other things that you can do to help prevent falls. This information is not intended to replace advice given to you by your health care provider. Make sure you discuss any questions you have with your health care provider. Document Released: 06/19/2009 Document Revised: 01/29/2016 Document Reviewed: 09/27/2014 Elsevier Interactive Patient Education  2017 [employer].

## 2019-05-11 ENCOUNTER — Other Ambulatory Visit: Payer: Self-pay | Admitting: Nurse Practitioner

## 2019-05-11 DIAGNOSIS — G4700 Insomnia, unspecified: Secondary | ICD-10-CM

## 2019-05-25 ENCOUNTER — Encounter (HOSPITAL_COMMUNITY): Payer: Self-pay

## 2019-05-25 ENCOUNTER — Ambulatory Visit (HOSPITAL_COMMUNITY)
Admission: RE | Admit: 2019-05-25 | Discharge: 2019-05-25 | Disposition: A | Discharge status: Home / Self Care | Payer: Medicare HMO | Source: Ambulatory Visit | Attending: Internal Medicine | Admitting: Internal Medicine

## 2019-05-25 ENCOUNTER — Inpatient Hospital Stay: Payer: Medicare HMO | Attending: Internal Medicine

## 2019-05-25 ENCOUNTER — Other Ambulatory Visit: Payer: Self-pay

## 2019-05-25 DIAGNOSIS — J439 Emphysema, unspecified: Secondary | ICD-10-CM | POA: Diagnosis not present

## 2019-05-25 DIAGNOSIS — R0609 Other forms of dyspnea: Secondary | ICD-10-CM | POA: Diagnosis not present

## 2019-05-25 DIAGNOSIS — J9809 Other diseases of bronchus, not elsewhere classified: Secondary | ICD-10-CM | POA: Diagnosis not present

## 2019-05-25 DIAGNOSIS — C3412 Malignant neoplasm of upper lobe, left bronchus or lung: Secondary | ICD-10-CM | POA: Diagnosis not present

## 2019-05-25 DIAGNOSIS — C3490 Malignant neoplasm of unspecified part of unspecified bronchus or lung: Secondary | ICD-10-CM

## 2019-05-25 DIAGNOSIS — I2510 Atherosclerotic heart disease of native coronary artery without angina pectoris: Secondary | ICD-10-CM | POA: Diagnosis not present

## 2019-05-25 DIAGNOSIS — I700 Atherosclerosis of aorta: Secondary | ICD-10-CM | POA: Insufficient documentation

## 2019-05-25 LAB — CBC WITH DIFFERENTIAL (CANCER CENTER ONLY)
Abs Immature Granulocytes: 0.01 10*3/uL (ref 0.00–0.07)
Basophils Absolute: 0 10*3/uL (ref 0.0–0.1)
Basophils Relative: 0 %
Eosinophils Absolute: 0.2 10*3/uL (ref 0.0–0.5)
Eosinophils Relative: 3 %
HCT: 44.6 % (ref 36.0–46.0)
Hemoglobin: 14.9 g/dL (ref 12.0–15.0)
Immature Granulocytes: 0 %
Lymphocytes Relative: 50 %
Lymphs Abs: 3.5 10*3/uL (ref 0.7–4.0)
MCH: 31.6 pg (ref 26.0–34.0)
MCHC: 33.4 g/dL (ref 30.0–36.0)
MCV: 94.7 fL (ref 80.0–100.0)
Monocytes Absolute: 0.6 10*3/uL (ref 0.1–1.0)
Monocytes Relative: 9 %
Neutro Abs: 2.7 10*3/uL (ref 1.7–7.7)
Neutrophils Relative %: 38 %
Platelet Count: 237 10*3/uL (ref 150–400)
RBC: 4.71 MIL/uL (ref 3.87–5.11)
RDW: 14.5 % (ref 11.5–15.5)
WBC Count: 7 10*3/uL (ref 4.0–10.5)
nRBC: 0 % (ref 0.0–0.2)

## 2019-05-25 LAB — CMP (CANCER CENTER ONLY)
ALT: 26 U/L (ref 0–44)
AST: 35 U/L (ref 15–41)
Albumin: 4.1 g/dL (ref 3.5–5.0)
Alkaline Phosphatase: 99 U/L (ref 38–126)
Anion gap: 11 (ref 5–15)
BUN: 14 mg/dL (ref 8–23)
CO2: 28 mmol/L (ref 22–32)
Calcium: 9.8 mg/dL (ref 8.9–10.3)
Chloride: 105 mmol/L (ref 98–111)
Creatinine: 1.18 mg/dL — ABNORMAL HIGH (ref 0.44–1.00)
GFR, Est AFR Am: 51 mL/min — ABNORMAL LOW (ref >60)
GFR, Estimated: 44 mL/min — ABNORMAL LOW (ref >60)
Glucose, Bld: 103 mg/dL — ABNORMAL HIGH (ref 70–99)
Potassium: 3.9 mmol/L (ref 3.5–5.1)
Sodium: 144 mmol/L (ref 135–145)
Total Bilirubin: 0.5 mg/dL (ref 0.3–1.2)
Total Protein: 7.4 g/dL (ref 6.5–8.1)

## 2019-05-25 MED ORDER — IOHEXOL 300 MG/ML  SOLN
75.0000 mL | Freq: Once | INTRAMUSCULAR | Status: AC | PRN
  Administered 2019-05-25: 75 mL via INTRAVENOUS

## 2019-05-25 MED ORDER — SODIUM CHLORIDE (PF) 0.9 % IJ SOLN
INTRAMUSCULAR | Status: AC
  Filled 2019-05-25: qty 50

## 2019-05-25 MED ORDER — IOHEXOL 300 MG/ML  SOLN: 100.0000 mL | Freq: Once | INTRAMUSCULAR | Status: DC | PRN

## 2019-05-28 ENCOUNTER — Other Ambulatory Visit: Payer: Self-pay

## 2019-05-28 ENCOUNTER — Encounter: Payer: Self-pay | Admitting: Internal Medicine

## 2019-05-28 ENCOUNTER — Inpatient Hospital Stay (HOSPITAL_BASED_OUTPATIENT_CLINIC_OR_DEPARTMENT_OTHER): Payer: Medicare HMO | Admitting: Internal Medicine

## 2019-05-28 VITALS — BP 142/82 | HR 94 | Temp 98.0°F | Resp 20 | Ht 63.0 in | Wt 155.4 lb

## 2019-05-28 DIAGNOSIS — C3490 Malignant neoplasm of unspecified part of unspecified bronchus or lung: Secondary | ICD-10-CM | POA: Diagnosis not present

## 2019-05-28 DIAGNOSIS — C3492 Malignant neoplasm of unspecified part of left bronchus or lung: Secondary | ICD-10-CM | POA: Diagnosis not present

## 2019-05-28 DIAGNOSIS — Z85118 Personal history of other malignant neoplasm of bronchus and lung: Secondary | ICD-10-CM

## 2019-05-28 DIAGNOSIS — R0609 Other forms of dyspnea: Secondary | ICD-10-CM | POA: Diagnosis not present

## 2019-05-28 DIAGNOSIS — I700 Atherosclerosis of aorta: Secondary | ICD-10-CM | POA: Diagnosis not present

## 2019-05-28 DIAGNOSIS — I2510 Atherosclerotic heart disease of native coronary artery without angina pectoris: Secondary | ICD-10-CM | POA: Diagnosis not present

## 2019-05-28 DIAGNOSIS — C3412 Malignant neoplasm of upper lobe, left bronchus or lung: Secondary | ICD-10-CM | POA: Diagnosis not present

## 2019-05-28 DIAGNOSIS — Z902 Acquired absence of lung [part of]: Secondary | ICD-10-CM

## 2019-05-28 DIAGNOSIS — I10 Essential (primary) hypertension: Secondary | ICD-10-CM

## 2019-05-28 NOTE — Progress Notes (Signed)
[facility] Telephone:([phone]) [phone]   Fax:([phone]) [phone]  OFFICE PROGRESS NOTE  [name], [address] [place] [address]  DIAGNOSIS: Stage IB (T2a, N0, M0) non-small cell lung cancer, adenocarcinoma presented with left upper lobe nodule diagnosed in September 2016.  PRIOR THERAPY: Status post left VATS with left upper lobectomy and mediastinal lymph node dissection on 07/04/2015 under the care of Dr. Roxan Hockey.  CURRENT THERAPY: Observation.  INTERVAL HISTORY: [name] 78 y.o. female returns to the clinic today for follow-up visit.  The patient is feeling fine today with no concerning complaints except for shortness of breath with exertion.  She denied having any chest pain, cough or hemoptysis.  She denied having any fever or chills.  She has no nausea, vomiting, diarrhea or constipation.  She has no recent weight loss or night sweats.  The patient had repeat CT scan of the chest performed recently and she is here for evaluation and discussion of her scan results.  MEDICAL HISTORY: Past Medical History:  Diagnosis Date  . Arthritis   . Asthma   . COPD (chronic obstructive pulmonary disease) ([employer])   . Depression   . GERD (gastroesophageal reflux disease)   . Hyperlipidemia   . Hypertension 06/15/2016  . left lung ca dx'd 2016  . Thyroid nodule     ALLERGIES:  has No Known Allergies.  MEDICATIONS:  Current Outpatient Medications  Medication Sig Dispense Refill  . albuterol (VENTOLIN HFA) 108 (90 Base) MCG/ACT inhaler INHALE 2 PUFFS INTO THE LUNGS EVERY 6 HOURS AS NEEDED FOR WHEEZING OR SHORTNESS OF BREATH. 6.7 g 2  . aspirin 81 MG tablet Take 81 mg by mouth every evening.     . Calcium Carbonate-Vitamin D 600-400 MG-UNIT tablet Take 1 tablet by mouth daily. (Patient taking differently: Take 1 tablet by mouth daily. 600 mg of calcium and 500 units of vitd)    . co-enzyme Q-10 30 MG capsule Take 30 mg by mouth daily.    .  cyclobenzaprine (FLEXERIL) 5 MG tablet Take 1 tablet (5 mg total) by mouth 3 (three) times daily as needed for muscle spasms. 20 tablet 0  . cycloSPORINE (RESTASIS) 0.05 % ophthalmic emulsion 1 drop 2 (two) times daily.    Marland Kitchen donepezil (ARICEPT) 10 MG tablet TAKE 1 TABLET BY MOUTH EVERY EVENING 90 tablet 1  . ezetimibe (ZETIA) 10 MG tablet TAKE 1 TABLET BY MOUTH EVERYDAY AT BEDTIME 90 tablet 1  . KLOR-CON M20 20 MEQ tablet TAKE 1 TABLET BY MOUTH EVERY DAY WITH FOOD 90 tablet 1  . Melatonin 2.5 MG CAPS Take 2.5 mg by mouth at bedtime as needed (sleep).     . mirtazapine (REMERON) 15 MG tablet TAKE 1 TABLET BY MOUTH EVERYDAY AT BEDTIME 90 tablet 1  . Multiple Vitamin (MULTIVITAMIN) tablet Take 1 tablet by mouth daily.    . niacin (NIASPAN) 500 MG CR tablet TAKE 1 TABLET BY MOUTH EVERY DAY IN THE EVENING 120 tablet 2  . OVER THE COUNTER MEDICATION Take 1 tablet by mouth daily.    . pantoprazole (PROTONIX) 40 MG tablet TAKE 1 TABLET BY MOUTH TWICE A DAY 180 tablet 1  . Probiotic Product (PROBIOTIC PO) Take by mouth.    . TRELEGY ELLIPTA 100-62.5-25 MCG/INH AEPB INHALE 1 PUFF BY MOUTH DAILY 60 each 2  . valACYclovir (VALTREX) 500 MG tablet TAKE 1 TABLET BY MOUTH EVERY DAY 90 tablet 2  . White Petrolatum-Mineral Oil (SYSTANE  NIGHTTIME OP) Place 1 drop into both eyes at bedtime as needed (for dry eyes).      No current facility-administered medications for this visit.     SURGICAL HISTORY:  Past Surgical History:  Procedure Laterality Date  . BIOPSY THYROID    . CATARACT EXTRACTION     left eye  . CATARACT EXTRACTION W/ INTRAOCULAR LENS IMPLANT     RIGHT  . CESAREAN SECTION    . EYE SURGERY    . MASS EXCISION N/A 12/13/2014   Procedure: EXCISION OF VOCAL CORD POLYPS;  Surgeon: Jerrell Belfast, MD;  Location: [facility];  Service: ENT;  Laterality: N/A;  . MICROLARYNGOSCOPY N/A 12/13/2014   Procedure: MICROLARYNGOSCOPY;  Surgeon: Jerrell Belfast, MD;  Location: [facility];  Service: ENT;  Laterality: N/A;   . TUBAL LIGATION    . VIDEO ASSISTED THORACOSCOPY (VATS)/ LOBECTOMY Left 07/04/2015   Procedure: VIDEO ASSISTED THORACOSCOPY (VATS) LEFT UPPER LOBECTOMY;  Surgeon: Melrose Nakayama, MD;  Location: [facility];  Service: Thoracic;  Laterality: Left;  Marland Kitchen VIDEO BRONCHOSCOPY WITH INSERTION OF INTERBRONCHIAL VALVE (IBV) N/A 07/11/2015   Procedure: VIDEO BRONCHOSCOPY WITH INSERTION OF INTERBRONCHIAL VALVE (IBV);  Surgeon: Melrose Nakayama, MD;  Location: [facility];  Service: Thoracic;  Laterality: N/A;  . VIDEO BRONCHOSCOPY WITH INSERTION OF INTERBRONCHIAL VALVE (IBV) N/A 09/10/2015   Procedure: VIDEO BRONCHOSCOPY WITH REMOVAL OF INTERBRONCHIAL VALVES;  Surgeon: Melrose Nakayama, MD;  Location: [facility];  Service: Thoracic;  Laterality: N/A;    REVIEW OF SYSTEMS:  A comprehensive review of systems was negative except for: Respiratory: positive for dyspnea on exertion   PHYSICAL EXAMINATION: General appearance: alert, cooperative and no distress Head: Normocephalic, without obvious abnormality, atraumatic Neck: no adenopathy, no JVD, supple, symmetrical, trachea midline and thyroid not enlarged, symmetric, no tenderness/mass/nodules Lymph nodes: Cervical, supraclavicular, and axillary nodes normal. Resp: clear to auscultation bilaterally Back: symmetric, no curvature. ROM normal. No CVA tenderness. Cardio: regular rate and rhythm, S1, S2 normal, no murmur, click, rub or gallop GI: soft, non-tender; bowel sounds normal; no masses,  no organomegaly Extremities: extremities normal, atraumatic, no cyanosis or edema  ECOG PERFORMANCE STATUS: 1 - Symptomatic but completely ambulatory  Blood pressure (!) 142/82, pulse 94, temperature 98 F (36.7 C), resp. rate 20, height 5\' 3"  (1.6 m), weight 155 lb 6.4 oz (70.5 kg), SpO2 95 %.  LABORATORY DATA: Lab Results  Component Value Date   WBC 7.0 05/25/2019   HGB 14.9 05/25/2019   HCT 44.6 05/25/2019   MCV 94.7 05/25/2019   PLT 237 05/25/2019       Chemistry      Component Value Date/Time   NA 144 05/25/2019 0920   NA 143 02/15/2019 1439   NA 142 06/20/2017 1502   K 3.9 05/25/2019 0920   K 4.0 06/20/2017 1502   CL 105 05/25/2019 0920   CO2 28 05/25/2019 0920   CO2 29 06/20/2017 1502   BUN 14 05/25/2019 0920   BUN 10 02/15/2019 1439   BUN 16.0 06/20/2017 1502   CREATININE 1.18 (H) 05/25/2019 0920   CREATININE 1.2 (H) 06/20/2017 1502   GLU 108 12/08/2017      Component Value Date/Time   CALCIUM 9.8 05/25/2019 0920   CALCIUM 9.8 06/20/2017 1502   ALKPHOS 99 05/25/2019 0920   ALKPHOS 74 06/20/2017 1502   AST 35 05/25/2019 0920   AST 35 (H) 06/20/2017 1502   ALT 26 05/25/2019 0920   ALT 30 06/20/2017 1502   BILITOT 0.5 05/25/2019  0920   BILITOT 0.94 06/20/2017 1502       RADIOGRAPHIC STUDIES: Ct Chest W Contrast  Result Date: 05/25/2019 CLINICAL DATA:  78 year old female with history of non-small cell lung cancer. Staging examination. EXAM: CT CHEST WITH CONTRAST TECHNIQUE: Multidetector CT imaging of the chest was performed during intravenous contrast administration. CONTRAST:  62mL OMNIPAQUE IOHEXOL 300 MG/ML  SOLN COMPARISON:  Chest CT 02/19/2019. FINDINGS: Cardiovascular: Heart size is normal. There is no significant pericardial fluid, thickening or pericardial calcification. There is aortic atherosclerosis, as well as atherosclerosis of the great vessels of the mediastinum and the coronary arteries, including calcified atherosclerotic plaque in the left anterior descending and right coronary arteries. Mediastinum/Nodes: Prominent but nonenlarged right hilar lymph node measuring 9 mm in short axis, unchanged. No pathologically enlarged mediastinal or hilar lymph nodes. Esophagus is unremarkable in appearance. No axillary lymphadenopathy. Lungs/Pleura: Status post left upper lobectomy. Compensatory hyperexpansion of the left lower lobe. Chronic subpleural nodular architectural distortion in the periphery of the left lower lobe  (axial image 100 of series 5), stable compared to the prior examination. In the superior segment of the right lower lobe (axial image 76 of series 5) there is a stable area of nodular architectural distortion measuring 8 x 7 mm, favored to represent an area of chronic scarring. No other suspicious appearing pulmonary nodules or masses are noted. No acute consolidative airspace disease. No pleural effusions. Diffuse bronchial wall thickening with moderate centrilobular and paraseptal emphysema. Upper Abdomen: Aortic atherosclerosis. Musculoskeletal: There are no aggressive appearing lytic or blastic lesions noted in the visualized portions of the skeleton. IMPRESSION: 1. Stable nodular areas of architectural distortion in the lungs bilaterally, most compatible with areas of chronic post infectious or inflammatory scarring. No definite findings to suggest recurrent or metastatic disease in the thorax. 2. Diffuse bronchial wall thickening with moderate centrilobular and paraseptal emphysema; imaging findings suggestive of underlying COPD. 3. Aortic atherosclerosis, in addition to 2 vessel coronary artery disease. Assessment for potential risk factor modification, dietary therapy or pharmacologic therapy may be warranted, if clinically indicated. Aortic Atherosclerosis (ICD10-I70.0) and Emphysema (ICD10-J43.9). Electronically Signed   By: Vinnie Langton M.D.   On: 05/25/2019 12:01    ASSESSMENT AND PLAN:  This is a very pleasant 78 years old African-American female with a stage IB non-small cell lung cancer status post right upper lobectomy with lymph node dissection in September 2016. The patient is currently on observation and has been doing well with no concerning complaints. She had repeat CT scan of the chest performed recently.  I personally and independently reviewed the scans and discussed the results with the patient today. Her scan showed no concerning findings for disease recurrence or metastasis. I  recommended for her to continue on observation with repeat CT scan of the chest in 1 year. For the aortic and coronary atherosclerosis, she is followed by cardiology. The patient was advised to call immediately if she has any other concerning symptoms in the interval. The patient voices understanding of current disease status and treatment options and is in agreement with the current care plan. All questions were answered. The patient knows to call the clinic with any problems, questions or concerns. We can certainly see the patient much sooner if necessary. I spent 10 minutes counseling the patient face to face. The total time spent in the appointment was 15 minutes.  Disclaimer: This note was dictated with voice recognition software. Similar sounding words can inadvertently be transcribed and may not be corrected upon  review.

## 2019-05-29 ENCOUNTER — Telehealth: Payer: Self-pay | Admitting: Internal Medicine

## 2019-05-29 NOTE — Telephone Encounter (Signed)
Scheduled appt per 9/21 LOS - mailed letter with appt date and time

## 2019-06-01 ENCOUNTER — Other Ambulatory Visit: Payer: Self-pay | Admitting: Internal Medicine

## 2019-06-19 ENCOUNTER — Encounter: Payer: Medicare HMO | Admitting: Thoracic Surgery (Cardiothoracic Vascular Surgery)

## 2019-06-25 ENCOUNTER — Other Ambulatory Visit: Payer: Self-pay | Admitting: Thoracic Surgery (Cardiothoracic Vascular Surgery)

## 2019-06-25 DIAGNOSIS — C3492 Malignant neoplasm of unspecified part of left bronchus or lung: Secondary | ICD-10-CM

## 2019-06-26 ENCOUNTER — Ambulatory Visit
Admission: RE | Admit: 2019-06-26 | Discharge: 2019-06-26 | Disposition: A | Discharge status: Home / Self Care | Payer: Medicare HMO | Source: Ambulatory Visit | Attending: Thoracic Surgery (Cardiothoracic Vascular Surgery) | Admitting: Thoracic Surgery (Cardiothoracic Vascular Surgery)

## 2019-06-26 ENCOUNTER — Other Ambulatory Visit: Payer: Self-pay

## 2019-06-26 ENCOUNTER — Ambulatory Visit (INDEPENDENT_AMBULATORY_CARE_PROVIDER_SITE_OTHER): Payer: Medicare HMO | Admitting: Thoracic Surgery (Cardiothoracic Vascular Surgery)

## 2019-06-26 ENCOUNTER — Encounter: Payer: Self-pay | Admitting: Thoracic Surgery (Cardiothoracic Vascular Surgery)

## 2019-06-26 VITALS — BP 137/79 | HR 88 | Temp 97.3°F | Resp 16 | Ht 63.0 in | Wt 155.2 lb

## 2019-06-26 DIAGNOSIS — Z902 Acquired absence of lung [part of]: Secondary | ICD-10-CM

## 2019-06-26 DIAGNOSIS — C3492 Malignant neoplasm of unspecified part of left bronchus or lung: Secondary | ICD-10-CM

## 2019-06-26 DIAGNOSIS — R0602 Shortness of breath: Secondary | ICD-10-CM | POA: Diagnosis not present

## 2019-06-26 NOTE — Progress Notes (Signed)
[address]       [place], [id number]             [phone]     HPI: [name] returns for a scheduled follow-up visit  [name] is a 78 year old woman with a past medical history significant for tobacco abuse (quit 2016), COPD, hypertension, hyperlipidemia, thyroid nodule, arthritis, and depression.  She had a thoracoscopic left upper lobectomy for stage Ib non-small cell carcinoma in October 2016.  She did not require adjuvant therapy.  She had a telephone visit with Dr. Julien Nordmann in September after her CT.  He is scheduled to see her again next September.  Overall she is feeling well.  She does notice that she gets short of breath with heavy exertion but not with routine activities.  She has an occasional left-sided chest pain.  She does not take anything for that.  Her appetite is good.  Her weight is stable.  She has not had any unusual headaches or visual changes.  Past Medical History:  Diagnosis Date   Arthritis    Asthma    COPD (chronic obstructive pulmonary disease) ([place])    Depression    GERD (gastroesophageal reflux disease)    Hyperlipidemia    Hypertension 06/15/2016   left lung ca dx'd 2016   Thyroid nodule     Current Outpatient Medications  Medication Sig Dispense Refill   albuterol (VENTOLIN HFA) 108 (90 Base) MCG/ACT inhaler INHALE 2 PUFFS INTO THE LUNGS EVERY 6 HOURS AS NEEDED FOR WHEEZING OR SHORTNESS OF BREATH. 6.7 g 2   aspirin 81 MG tablet Take 81 mg by mouth every evening.      Calcium Carbonate-Vitamin D 600-400 MG-UNIT tablet Take 1 tablet by mouth daily. (Patient taking differently: Take 1 tablet by mouth daily. 600 mg of calcium and 500 units of vitd)     co-enzyme Q-10 30 MG capsule Take 30 mg by mouth daily.     cyclobenzaprine (FLEXERIL) 5 MG tablet Take 1 tablet (5 mg total) by mouth 3 (three) times daily as needed for muscle spasms. 20 tablet 0   cycloSPORINE (RESTASIS) 0.05 % ophthalmic emulsion 1  drop 2 (two) times daily.     donepezil (ARICEPT) 10 MG tablet TAKE 1 TABLET BY MOUTH EVERY DAY IN THE EVENING 90 tablet 1   ezetimibe (ZETIA) 10 MG tablet TAKE 1 TABLET BY MOUTH EVERYDAY AT BEDTIME 90 tablet 1   KLOR-CON M20 20 MEQ tablet TAKE 1 TABLET BY MOUTH EVERY DAY WITH FOOD 90 tablet 1   Melatonin 2.5 MG CAPS Take 2.5 mg by mouth at bedtime as needed (sleep).      mirtazapine (REMERON) 15 MG tablet TAKE 1 TABLET BY MOUTH EVERYDAY AT BEDTIME 90 tablet 1   Multiple Vitamin (MULTIVITAMIN) tablet Take 1 tablet by mouth daily.     niacin (NIASPAN) 500 MG CR tablet TAKE 1 TABLET BY MOUTH EVERY DAY IN THE EVENING 120 tablet 2   OVER THE COUNTER MEDICATION Take 1 tablet by mouth daily.     pantoprazole (PROTONIX) 40 MG tablet TAKE 1 TABLET BY MOUTH TWICE A DAY 180 tablet 1   Probiotic Product (PROBIOTIC PO) Take by mouth.     TRELEGY ELLIPTA 100-62.5-25 MCG/INH AEPB INHALE 1 PUFF BY MOUTH DAILY 60 each 2   valACYclovir (VALTREX) 500 MG tablet TAKE 1 TABLET BY MOUTH EVERY DAY 90 tablet 2   White Petrolatum-Mineral Oil (SYSTANE NIGHTTIME OP) Place 1 drop into  both eyes at bedtime as needed (for dry eyes).      No current facility-administered medications for this visit.     Physical Exam BP 137/79 (BP Location: Right Arm, Patient Position: Sitting, Cuff Size: Normal)    Pulse 88    Temp (!) 97.3 F (36.3 C)    Resp 16    Ht 5\' 3"  (1.6 m)    Wt 155 lb 3.2 oz (70.4 kg)    SpO2 94% Comment: RA   BMI 27.69 kg/m  78 year old woman in no acute distress Alert and oriented x3 with no focal deficits No cervical or supraclavicular adenopathy Lungs slightly diminished at left base, otherwise clear, no rales or wheezing Cardiac regular rate and rhythm normal S1 and S2  Diagnostic Tests: COMPARISON:  08/18/2018 chest x-ray and 05/25/2019 chest CT  FINDINGS: The cardiac silhouette, mediastinal and hilar contours are within normal limits and stable. Mild tortuosity and calcification of  the thoracic aorta.  Stable emphysematous changes and pulmonary scarring. No acute superimposed pulmonary process. No worrisome pulmonary lesions or pleural effusions. The bony thorax is intact.  IMPRESSION: Chronic emphysematous changes and pulmonary scarring but no acute overlying pulmonary process.  No worrisome pulmonary lesions.   Electronically Signed   By: Marijo Sanes M.D.   On: 06/26/2019 12:43 CT CHEST WITH CONTRAST  TECHNIQUE: Multidetector CT imaging of the chest was performed during intravenous contrast administration.  CONTRAST:  48mL OMNIPAQUE IOHEXOL 300 MG/ML  SOLN  COMPARISON:  Chest CT 02/19/2019.  FINDINGS: Cardiovascular: Heart size is normal. There is no significant pericardial fluid, thickening or pericardial calcification. There is aortic atherosclerosis, as well as atherosclerosis of the great vessels of the mediastinum and the coronary arteries, including calcified atherosclerotic plaque in the left anterior descending and right coronary arteries.  Mediastinum/Nodes: Prominent but nonenlarged right hilar lymph node measuring 9 mm in short axis, unchanged. No pathologically enlarged mediastinal or hilar lymph nodes. Esophagus is unremarkable in appearance. No axillary lymphadenopathy.  Lungs/Pleura: Status post left upper lobectomy. Compensatory hyperexpansion of the left lower lobe. Chronic subpleural nodular architectural distortion in the periphery of the left lower lobe (axial image 100 of series 5), stable compared to the prior examination. In the superior segment of the right lower lobe (axial image 76 of series 5) there is a stable area of nodular architectural distortion measuring 8 x 7 mm, favored to represent an area of chronic scarring. No other suspicious appearing pulmonary nodules or masses are noted. No acute consolidative airspace disease. No pleural effusions. Diffuse bronchial wall thickening with moderate  centrilobular and paraseptal emphysema.  Upper Abdomen: Aortic atherosclerosis.  Musculoskeletal: There are no aggressive appearing lytic or blastic lesions noted in the visualized portions of the skeleton.  IMPRESSION: 1. Stable nodular areas of architectural distortion in the lungs bilaterally, most compatible with areas of chronic post infectious or inflammatory scarring. No definite findings to suggest recurrent or metastatic disease in the thorax. 2. Diffuse bronchial wall thickening with moderate centrilobular and paraseptal emphysema; imaging findings suggestive of underlying COPD. 3. Aortic atherosclerosis, in addition to 2 vessel coronary artery disease. Assessment for potential risk factor modification, dietary therapy or pharmacologic therapy may be warranted, if clinically indicated.  Aortic Atherosclerosis (ICD10-I70.0) and Emphysema (ICD10-J43.9).   Electronically Signed   By: Vinnie Langton M.D.   On: 05/25/2019 12:01 I personally reviewed the chest x-ray and CT images and concur with the findings noted above.  Impression: [name] is a 78 year old woman with a past medical  history significant for tobacco abuse (quit 2016), COPD, hypertension, hyperlipidemia, thyroid nodule, arthritis, and depression.  Stage Ib non-small cell carcinoma left upper lobe-status post lobectomy 4 years ago.  Did not require adjuvant therapy.  No evidence recurrent disease in 4 years.  She does have some architectural distortion with a nodular area in the right lower lobe but it has been stable over time.  She will have another CT scan in September 2021.  Tobacco abuse-quit smoking in 2016.  COPD-stable.  Plan: I will see her back in 1 year after she sees Dr. Julien Nordmann.  We will just use the CT scan that he has ordered.  Melrose Nakayama, MD [facility] [phone]

## 2019-06-28 ENCOUNTER — Other Ambulatory Visit: Payer: Self-pay

## 2019-06-28 ENCOUNTER — Ambulatory Visit: Payer: Medicare HMO

## 2019-06-28 ENCOUNTER — Ambulatory Visit (INDEPENDENT_AMBULATORY_CARE_PROVIDER_SITE_OTHER): Payer: Medicare HMO | Admitting: Nurse Practitioner

## 2019-06-28 ENCOUNTER — Encounter: Payer: Self-pay | Admitting: Nurse Practitioner

## 2019-06-28 VITALS — BP 102/70 | Temp 98.4°F | Ht 63.0 in | Wt 155.6 lb

## 2019-06-28 DIAGNOSIS — I10 Essential (primary) hypertension: Secondary | ICD-10-CM

## 2019-06-28 DIAGNOSIS — Z0000 Encounter for general adult medical examination without abnormal findings: Secondary | ICD-10-CM | POA: Diagnosis not present

## 2019-06-28 DIAGNOSIS — Z23 Encounter for immunization: Secondary | ICD-10-CM

## 2019-06-28 DIAGNOSIS — J441 Chronic obstructive pulmonary disease with (acute) exacerbation: Secondary | ICD-10-CM

## 2019-06-28 DIAGNOSIS — G3184 Mild cognitive impairment, so stated: Secondary | ICD-10-CM | POA: Diagnosis not present

## 2019-06-28 DIAGNOSIS — E782 Mixed hyperlipidemia: Secondary | ICD-10-CM

## 2019-06-28 DIAGNOSIS — R7303 Prediabetes: Secondary | ICD-10-CM

## 2019-06-28 DIAGNOSIS — R630 Anorexia: Secondary | ICD-10-CM | POA: Diagnosis not present

## 2019-06-28 LAB — POCT URINALYSIS DIPSTICK
Bilirubin, UA: NEGATIVE
Glucose, UA: NEGATIVE
Ketones, UA: NEGATIVE
Leukocytes, UA: NEGATIVE
Nitrite, UA: NEGATIVE
Protein, UA: NEGATIVE
Spec Grav, UA: 1.01 (ref 1.010–1.025)
Urobilinogen, UA: 0.2 E.U./dL
pH, UA: 5 (ref 5.0–8.0)

## 2019-06-28 NOTE — Patient Instructions (Signed)
Health Maintenance  Topic Date Due  . DEXA SCAN  04/12/2006  . URINE MICROALBUMIN  02/15/2020  . TETANUS/TDAP  10/30/2022  . INFLUENZA VACCINE  Completed  . PNA vac Low Risk Adult  Completed   Health Maintenance, Female Adopting a healthy lifestyle and getting preventive care are important in promoting health and wellness. Ask your health care provider about:  The right schedule for you to have regular tests and exams.  Things you can do on your own to prevent diseases and keep yourself healthy. What should I know about diet, weight, and exercise? Eat a healthy diet   Eat a diet that includes plenty of vegetables, fruits, low-fat dairy products, and lean protein.  Do not eat a lot of foods that are high in solid fats, added sugars, or sodium. Maintain a healthy weight Body mass index (BMI) is used to identify weight problems. It estimates body fat based on height and weight. Your health care provider can help determine your BMI and help you achieve or maintain a healthy weight. Get regular exercise Get regular exercise. This is one of the most important things you can do for your health. Most adults should:  Exercise for at least 150 minutes each week. The exercise should increase your heart rate and make you sweat (moderate-intensity exercise).  Do strengthening exercises at least twice a week. This is in addition to the moderate-intensity exercise.  Spend less time sitting. Even light physical activity can be beneficial. Watch cholesterol and blood lipids Have your blood tested for lipids and cholesterol at 78 years of age, then have this test every 5 years. Have your cholesterol levels checked more often if:  Your lipid or cholesterol levels are high.  You are older than 78 years of age.  You are at high risk for heart disease. What should I know about cancer screening? Depending on your health history and family history, you may need to have cancer screening at various  ages. This may include screening for:  Breast cancer.  Cervical cancer.  Colorectal cancer.  Skin cancer.  Lung cancer. What should I know about heart disease, diabetes, and high blood pressure? Blood pressure and heart disease  High blood pressure causes heart disease and increases the risk of stroke. This is more likely to develop in people who have high blood pressure readings, are of African descent, or are overweight.  Have your blood pressure checked: ? Every 3-5 years if you are 59-71 years of age. ? Every year if you are 86 years old or older. Diabetes Have regular diabetes screenings. This checks your fasting blood sugar level. Have the screening done:  Once every three years after age 10 if you are at a normal weight and have a low risk for diabetes.  More often and at a younger age if you are overweight or have a high risk for diabetes. What should I know about preventing infection? Hepatitis B If you have a higher risk for hepatitis B, you should be screened for this virus. Talk with your health care provider to find out if you are at risk for hepatitis B infection. Hepatitis C Testing is recommended for:  Everyone born from [birth year] through 1965.  Anyone with known risk factors for hepatitis C. Sexually transmitted infections (STIs)  Get screened for STIs, including gonorrhea and chlamydia, if: ? You are sexually active and are younger than 78 years of age. ? You are older than 78 years of age and your health care  provider tells you that you are at risk for this type of infection. ? Your sexual activity has changed since you were last screened, and you are at increased risk for chlamydia or gonorrhea. Ask your health care provider if you are at risk.  Ask your health care provider about whether you are at high risk for HIV. Your health care provider may recommend a prescription medicine to help prevent HIV infection. If you choose to take medicine to prevent HIV, you  should first get tested for HIV. You should then be tested every 3 months for as long as you are taking the medicine. Pregnancy  If you are about to stop having your period (premenopausal) and you may become pregnant, seek counseling before you get pregnant.  Take 400 to 800 micrograms (mcg) of folic acid every day if you become pregnant.  Ask for birth control (contraception) if you want to prevent pregnancy. Osteoporosis and menopause Osteoporosis is a disease in which the bones lose minerals and strength with aging. This can result in bone fractures. If you are 65 years old or older, or if you are at risk for osteoporosis and fractures, ask your health care provider if you should:  Be screened for bone loss.  Take a calcium or vitamin D supplement to lower your risk of fractures.  Be given hormone replacement therapy (HRT) to treat symptoms of menopause. Follow these instructions at home: Lifestyle  Do not use any products that contain nicotine or tobacco, such as cigarettes, e-cigarettes, and chewing tobacco. If you need help quitting, ask your health care provider.  Do not use street drugs.  Do not share needles.  Ask your health care provider for help if you need support or information about quitting drugs. Alcohol use  Do not drink alcohol if: ? Your health care provider tells you not to drink. ? You are pregnant, may be pregnant, or are planning to become pregnant.  If you drink alcohol: ? Limit how much you use to 0-1 drink a day. ? Limit intake if you are breastfeeding.  Be aware of how much alcohol is in your drink. In the [place] one drink equals one 12 oz bottle of beer (355 mL), one 5 oz glass of wine (148 mL), or one 1 oz glass of hard liquor (44 mL). General instructions  Schedule regular health, dental, and eye exams.  Stay current with your vaccines.  Tell your health care provider if: ? You often feel depressed. ? You have ever been abused or do not feel  safe at home. Summary  Adopting a healthy lifestyle and getting preventive care are important in promoting health and wellness.  Follow your health care provider's instructions about healthy diet, exercising, and getting tested or screened for diseases.  Follow your health care provider's instructions on monitoring your cholesterol and blood pressure. This information is not intended to replace advice given to you by your health care provider. Make sure you discuss any questions you have with your health care provider. Document Released: 03/08/2011 Document Revised: 08/16/2018 Document Reviewed: 08/16/2018 Elsevier Patient Education  2020 [employer].

## 2019-06-28 NOTE — Progress Notes (Signed)
Subjective:     Patient ID: [name] , female    DOB: 07/22/1941 , 78 y.o.   MRN: [id number]   Chief Complaint  Patient presents with  . Annual Exam    HPI  Here for HM  Wt Readings from Last 3 Encounters: 06/28/19 : 155 lb 9.6 oz (70.6 kg) 06/26/19 : 155 lb 3.2 oz (70.4 kg) 05/28/19 : 155 lb 6.4 oz (70.5 kg)  She reports lack of appetite   No LMP recorded. Patient is postmenopausal..  Mammogram (no longer has done due to age).  Negative for: breast discharge, breast lump(s), breast pain and breast self exam.  Pertinent negatives include abnormal bleeding (hematology), anxiety, decreased libido, depression, difficulty falling sleep, dyspareunia, history of infertility, nocturia, sexual dysfunction, sleep disturbances, urinary incontinence, urinary urgency, vaginal discharge and vaginal itching. Diet regular.The patient states her exercise level is  minimal    The patient's tobacco use is:  Social History   Tobacco Use  Smoking Status Former Smoker  . Packs/day: 1.00  . Years: 56.00  . Pack years: 56.00  . Types: Cigarettes  . Quit date: 06/22/2015  . Years since quitting: 4.0  Smokeless Tobacco Never Used   She has been exposed to passive smoke. The patient's alcohol use is:  Social History   Substance and Sexual Activity  Alcohol Use No  . Alcohol/week: 0.0 standard drinks     Past Medical History:  Diagnosis Date  . Arthritis   . Asthma   . COPD (chronic obstructive pulmonary disease) ([place])   . Depression   . GERD (gastroesophageal reflux disease)   . Hyperlipidemia   . Hypertension 06/15/2016  . left lung ca dx'd 2016  . Thyroid nodule      Family History  Problem Relation Age of Onset  . Renal cancer Brother   . Diabetes Mother   . Hypertension Mother   . Renal Disease Mother   . Healthy Father      Current Outpatient Medications:  .  albuterol (VENTOLIN HFA) 108 (90 Base) MCG/ACT inhaler, INHALE 2 PUFFS INTO THE LUNGS EVERY 6 HOURS AS  NEEDED FOR WHEEZING OR SHORTNESS OF BREATH., Disp: 6.7 g, Rfl: 2 .  aspirin 81 MG tablet, Take 81 mg by mouth every evening. , Disp: , Rfl:  .  Calcium Carbonate-Vitamin D 600-400 MG-UNIT tablet, Take 1 tablet by mouth daily. (Patient taking differently: Take 1 tablet by mouth daily. 600 mg of calcium and 500 units of vitd), Disp:  , Rfl:  .  co-enzyme Q-10 30 MG capsule, Take 30 mg by mouth daily., Disp: , Rfl:  .  cyclobenzaprine (FLEXERIL) 5 MG tablet, Take 1 tablet (5 mg total) by mouth 3 (three) times daily as needed for muscle spasms., Disp: 20 tablet, Rfl: 0 .  cycloSPORINE (RESTASIS) 0.05 % ophthalmic emulsion, 1 drop 2 (two) times daily., Disp: , Rfl:  .  donepezil (ARICEPT) 10 MG tablet, TAKE 1 TABLET BY MOUTH EVERY DAY IN THE EVENING, Disp: 90 tablet, Rfl: 1 .  ezetimibe (ZETIA) 10 MG tablet, TAKE 1 TABLET BY MOUTH EVERYDAY AT BEDTIME, Disp: 90 tablet, Rfl: 1 .  KLOR-CON M20 20 MEQ tablet, TAKE 1 TABLET BY MOUTH EVERY DAY WITH FOOD, Disp: 90 tablet, Rfl: 1 .  Melatonin 2.5 MG CAPS, Take 2.5 mg by mouth at bedtime as needed (sleep). , Disp: , Rfl:  .  mirtazapine (REMERON) 15 MG tablet, TAKE 1 TABLET BY MOUTH EVERYDAY AT BEDTIME, Disp: 90 tablet, Rfl: 1 .  Multiple Vitamin (MULTIVITAMIN) tablet, Take 1 tablet by mouth daily., Disp: , Rfl:  .  niacin (NIASPAN) 500 MG CR tablet, TAKE 1 TABLET BY MOUTH EVERY DAY IN THE EVENING, Disp: 120 tablet, Rfl: 2 .  OVER THE COUNTER MEDICATION, Take 1 tablet by mouth daily., Disp: , Rfl:  .  pantoprazole (PROTONIX) 40 MG tablet, TAKE 1 TABLET BY MOUTH TWICE A DAY, Disp: 180 tablet, Rfl: 1 .  Probiotic Product (PROBIOTIC PO), Take by mouth., Disp: , Rfl:  .  TRELEGY ELLIPTA 100-62.5-25 MCG/INH AEPB, INHALE 1 PUFF BY MOUTH DAILY, Disp: 60 each, Rfl: 2 .  valACYclovir (VALTREX) 500 MG tablet, TAKE 1 TABLET BY MOUTH EVERY DAY, Disp: 90 tablet, Rfl: 2 .  White Petrolatum-Mineral Oil (SYSTANE NIGHTTIME OP), Place 1 drop into both eyes at bedtime as needed  (for dry eyes). , Disp: , Rfl:    No Known Allergies   Review of Systems  Constitutional: Positive for appetite change.  HENT: Negative.   Eyes: Negative.   Respiratory: Negative.   Cardiovascular: Negative.   Gastrointestinal: Negative.   Endocrine: Negative.   Genitourinary: Negative.   Musculoskeletal: Negative.   Skin: Negative.   Allergic/Immunologic: Negative.   Neurological: Negative.  Negative for dizziness and headaches.  Hematological: Negative.   Psychiatric/Behavioral: Negative.      Today's Vitals   06/28/19 1051  BP: 102/70  Temp: 98.4 F (36.9 C)  TempSrc: Oral  Weight: 155 lb 9.6 oz (70.6 kg)  Height: '5\' 3"'$  (1.6 m)  PainSc: 0-No pain   Body mass index is 27.56 kg/m.   Objective:  Physical Exam Vitals signs reviewed.  Constitutional:      General: She is not in acute distress.    Appearance: Normal appearance. She is well-developed.  HENT:     Head: Normocephalic and atraumatic.     Right Ear: Hearing, tympanic membrane, ear canal and external ear normal. There is no impacted cerumen.     Left Ear: Hearing, tympanic membrane, ear canal and external ear normal. There is no impacted cerumen.  Eyes:     General: Lids are normal.     Extraocular Movements: Extraocular movements intact.     Conjunctiva/sclera: Conjunctivae normal.     Pupils: Pupils are equal, round, and reactive to light.     Funduscopic exam:    Right eye: No papilledema.        Left eye: No papilledema.  Neck:     Musculoskeletal: Full passive range of motion without pain, normal range of motion and neck supple.     Thyroid: No thyroid mass.     Vascular: No carotid bruit.  Cardiovascular:     Rate and Rhythm: Normal rate and regular rhythm.     Pulses: Normal pulses.     Heart sounds: Normal heart sounds. No murmur.  Pulmonary:     Effort: Pulmonary effort is normal.     Breath sounds: Normal breath sounds.  Abdominal:     General: Abdomen is flat. Bowel sounds are normal.      Palpations: Abdomen is soft.  Musculoskeletal: Normal range of motion.        General: No swelling.     Right lower leg: No edema.     Left lower leg: No edema.  Skin:    General: Skin is warm and dry.     Capillary Refill: Capillary refill takes less than 2 seconds.  Neurological:     General: No focal deficit present.  Mental Status: She is alert and oriented to person, place, and time.     Cranial Nerves: No cranial nerve deficit.     Sensory: No sensory deficit.  Psychiatric:        Mood and Affect: Mood normal.        Behavior: Behavior normal.        Thought Content: Thought content normal.        Judgment: Judgment normal.         Assessment And Plan:     1. Encounter for general adult medical examination w/o abnormal findings . Behavior modifications discussed and diet history reviewed.   . Pt will continue to exercise regularly and modify diet with low GI, plant based foods and decrease intake of processed foods.  . Recommend intake of daily multivitamin, Vitamin D, and calcium.  . Recommend mammogram for preventive screenings, as well as recommend immunizations that include influenza, TDAP - POCT Urinalysis Dipstick (81002)  2. Need for influenza vaccination  Influenza vaccine given in office  Advised to take Tylenol as needed for muscle aches or fever - Flu vaccine HIGH DOSE PF (Fluzone High dose)  3. Lack of appetite  She feels is related to not having a taste for anything specific  4. Essential hypertension . B/P is controlled.  Marland Kitchen BMP ordered to check renal function.   - BMP8+eGFR  5. Prediabetes  Chronic, controlled  Continue with current medications  Encouraged to limit intake of sugary foods and drinks - BMP8+eGFR - Hemoglobin A1c  6. Chronic obstructive pulmonary disease with acute exacerbation (HCC)  Stable, continue follow up with Pulmonary  7. Mixed hyperlipidemia  Chronic, controlled  Continue with current medications -  Lipid panel  8. Mild cognitive impairment  Stable, tolerating donepezil well        [name], FNP    THE PATIENT IS ENCOURAGED TO PRACTICE SOCIAL DISTANCING DUE TO THE COVID-19 PANDEMIC.

## 2019-06-29 LAB — BMP8+EGFR
BUN/Creatinine Ratio: 10 — ABNORMAL LOW (ref 12–28)
BUN: 11 mg/dL (ref 8–27)
CO2: 28 mmol/L (ref 20–29)
Calcium: 10.2 mg/dL (ref 8.7–10.3)
Chloride: 102 mmol/L (ref 96–106)
Creatinine, Ser: 1.09 mg/dL — ABNORMAL HIGH (ref 0.57–1.00)
GFR calc Af Amer: 56 mL/min/{1.73_m2} — ABNORMAL LOW (ref >59)
GFR calc non Af Amer: 49 mL/min/{1.73_m2} — ABNORMAL LOW (ref >59)
Glucose: 103 mg/dL — ABNORMAL HIGH (ref 65–99)
Potassium: 4.2 mmol/L (ref 3.5–5.2)
Sodium: 142 mmol/L (ref 134–144)

## 2019-06-29 LAB — LIPID PANEL
Chol/HDL Ratio: 4.7 ratio — ABNORMAL HIGH (ref 0.0–4.4)
Cholesterol, Total: 219 mg/dL — ABNORMAL HIGH (ref 100–199)
HDL: 47 mg/dL (ref >39)
LDL Chol Calc (NIH): 147 mg/dL — ABNORMAL HIGH (ref 0–99)
Triglycerides: 136 mg/dL (ref 0–149)
VLDL Cholesterol Cal: 25 mg/dL (ref 5–40)

## 2019-06-29 LAB — HEMOGLOBIN A1C
Est. average glucose Bld gHb Est-mCnc: 137 mg/dL
Hgb A1c MFr Bld: 6.4 % — ABNORMAL HIGH (ref 4.8–5.6)

## 2019-07-12 DIAGNOSIS — Z1231 Encounter for screening mammogram for malignant neoplasm of breast: Secondary | ICD-10-CM | POA: Diagnosis not present

## 2019-07-12 DIAGNOSIS — Z803 Family history of malignant neoplasm of breast: Secondary | ICD-10-CM | POA: Diagnosis not present

## 2019-07-12 LAB — HM MAMMOGRAPHY

## 2019-07-16 ENCOUNTER — Encounter: Payer: Self-pay | Admitting: Nurse Practitioner

## 2019-07-16 ENCOUNTER — Ambulatory Visit (INDEPENDENT_AMBULATORY_CARE_PROVIDER_SITE_OTHER): Payer: Medicare HMO | Admitting: Pulmonary Disease

## 2019-07-16 ENCOUNTER — Encounter: Payer: Self-pay | Admitting: Pulmonary Disease

## 2019-07-16 ENCOUNTER — Other Ambulatory Visit: Payer: Self-pay

## 2019-07-16 DIAGNOSIS — J301 Allergic rhinitis due to pollen: Secondary | ICD-10-CM | POA: Diagnosis not present

## 2019-07-16 DIAGNOSIS — J441 Chronic obstructive pulmonary disease with (acute) exacerbation: Secondary | ICD-10-CM | POA: Diagnosis not present

## 2019-07-16 DIAGNOSIS — C3492 Malignant neoplasm of unspecified part of left bronchus or lung: Secondary | ICD-10-CM

## 2019-07-16 MED ORDER — ALBUTEROL SULFATE (2.5 MG/3ML) 0.083% IN NEBU: 2.5000 mg | INHALATION_SOLUTION | Freq: Four times a day (QID) | RESPIRATORY_TRACT | 12 refills | Status: DC | PRN

## 2019-07-16 MED ORDER — TRELEGY ELLIPTA 100-62.5-25 MCG/INH IN AEPB: 1.0000 | INHALATION_SPRAY | Freq: Every day | RESPIRATORY_TRACT | 3 refills | Status: DC

## 2019-07-16 MED ORDER — PREDNISONE 10 MG PO TABS: ORAL_TABLET | ORAL | 0 refills | Status: DC

## 2019-07-16 NOTE — Assessment & Plan Note (Addendum)
Prednisone 10 mg tabs  Take 2 tabs daily with food x 5ds, then 1 tab daily with food x 5ds then STOP  Refills on Trelegy.  Prescription for nebulizer and albuterol nebs every 6 hours as needed #30 to use during an emergency  Oxygen level is borderline, she does tend to desaturate with exertion, would like to hold off on oxygen as much as possible and I agree.  I do not doubt that if she has a flareup she may end up on oxygen

## 2019-07-16 NOTE — Assessment & Plan Note (Addendum)
Low-dose prednisone should help with allergies

## 2019-07-16 NOTE — Addendum Note (Signed)
Addended by: Hildred Alamin I on: 07/16/2019 02:30 PM   Modules accepted: Orders

## 2019-07-16 NOTE — Patient Instructions (Signed)
Prednisone 10 mg tabs  Take 2 tabs daily with food x 5ds, then 1 tab daily with food x 5ds then STOP  Refills on Trelegy.  Prescription for nebulizer and albuterol nebs every 6 hours as needed #30 to use during an emergency  Oxygen level is borderline

## 2019-07-16 NOTE — Assessment & Plan Note (Signed)
Stable scarring. Annual surveillance CT is planned next in 2021

## 2019-07-16 NOTE — Progress Notes (Signed)
   Subjective:    Patient ID: [name], female    DOB: November 20, 1940, 78 y.o.   MRN: [id number]  HPI  78 yo ex-smoker for follow-up of COPD and lung cancer She smoked about 60 pack years. She worked in a [employer] for 20 years  06/2015 underwent left VATS with left upper lobectomy and mediastinal lymph node dissection. Path showed invasive adenocarcinoma, adenocarcinoma involving the visceral pleura in addition to lymphovascular invasion. The resection margins as well as the dissected lymph nodes were negative for malignancy. She had persistent air leak postop and required endobronchial valves for resolution.  Chief Complaint  Patient presents with  . Follow-up    Patient reports sob with exertion and wheezing.     Breathing appears slightly worse, she attributes this to allergies.  Compliant with Trelegy, needs refills. We reviewed CT chest done by Dr. Roxan Hockey from 05/2019 Oxygen saturation dropped to 87% on walking into the room and then improved to 91% on resting   Significant tests/ events reviewed 05/2019 CT chest >> stable areas of scarring left upper lobe postsurgical, right lower lobe nodular scarring 12/2017 CT chest >> stable, nodular area of left lung scarring 2016 Pre-op PFT - FEV1 73 %, DLCO 48%  03/2016 FEV1 66%  Spirometry 10/2017 shows an FEV1 at 63% ratio 57 and FVC of 86%.   Past Medical History:  Diagnosis Date  . Arthritis   . Asthma   . COPD (chronic obstructive pulmonary disease) ([place])   . Depression   . GERD (gastroesophageal reflux disease)   . Hyperlipidemia   . Hypertension 06/15/2016  . left lung ca dx'd 2016  . Thyroid nodule      Review of Systems    neg for any significant sore throat, dysphagia, itching, sneezing, nasal congestion or excess/ purulent secretions, fever, chills, sweats, unintended wt loss, pleuritic or exertional cp, hempoptysis, orthopnea pnd or change in chronic leg swelling. Also denies presyncope,  palpitations, heartburn, abdominal pain, nausea, vomiting, diarrhea or change in bowel or urinary habits, dysuria,hematuria, rash, arthralgias, visual complaints, headache, numbness weakness or ataxia.  Objective:   Physical Exam  Gen. Pleasant, well-nourished, in no distress, normal affect ENT - no pallor,icterus, no post nasal drip Neck: No JVD, no thyromegaly, no carotid bruits Lungs: no use of accessory muscles, no dullness to percussion, decreased without rales or rhonchi  Cardiovascular: Rhythm regular, heart sounds  normal, no murmurs or gallops, no peripheral edema Abdomen: soft and non-tender, no hepatosplenomegaly, BS normal. Musculoskeletal: No deformities, no cyanosis or clubbing Neuro:  alert, non focal        Assessment & Plan:

## 2019-07-18 DIAGNOSIS — R0600 Dyspnea, unspecified: Secondary | ICD-10-CM | POA: Diagnosis not present

## 2019-07-18 DIAGNOSIS — J449 Chronic obstructive pulmonary disease, unspecified: Secondary | ICD-10-CM | POA: Diagnosis not present

## 2019-07-18 DIAGNOSIS — J309 Allergic rhinitis, unspecified: Secondary | ICD-10-CM | POA: Diagnosis not present

## 2019-08-17 DIAGNOSIS — J309 Allergic rhinitis, unspecified: Secondary | ICD-10-CM | POA: Diagnosis not present

## 2019-08-17 DIAGNOSIS — J449 Chronic obstructive pulmonary disease, unspecified: Secondary | ICD-10-CM | POA: Diagnosis not present

## 2019-08-17 DIAGNOSIS — R0600 Dyspnea, unspecified: Secondary | ICD-10-CM | POA: Diagnosis not present

## 2019-08-23 ENCOUNTER — Other Ambulatory Visit: Payer: Self-pay | Admitting: Internal Medicine

## 2019-08-28 ENCOUNTER — Other Ambulatory Visit: Payer: Self-pay | Admitting: Nurse Practitioner

## 2019-08-30 ENCOUNTER — Other Ambulatory Visit: Payer: Self-pay | Admitting: Nurse Practitioner

## 2019-08-30 DIAGNOSIS — G4700 Insomnia, unspecified: Secondary | ICD-10-CM

## 2019-09-17 DIAGNOSIS — J449 Chronic obstructive pulmonary disease, unspecified: Secondary | ICD-10-CM | POA: Diagnosis not present

## 2019-09-17 DIAGNOSIS — R0600 Dyspnea, unspecified: Secondary | ICD-10-CM | POA: Diagnosis not present

## 2019-09-17 DIAGNOSIS — J309 Allergic rhinitis, unspecified: Secondary | ICD-10-CM | POA: Diagnosis not present

## 2019-09-28 ENCOUNTER — Other Ambulatory Visit: Payer: Self-pay | Admitting: Nurse Practitioner

## 2019-10-18 DIAGNOSIS — R0600 Dyspnea, unspecified: Secondary | ICD-10-CM | POA: Diagnosis not present

## 2019-10-18 DIAGNOSIS — J309 Allergic rhinitis, unspecified: Secondary | ICD-10-CM | POA: Diagnosis not present

## 2019-10-18 DIAGNOSIS — J449 Chronic obstructive pulmonary disease, unspecified: Secondary | ICD-10-CM | POA: Diagnosis not present

## 2019-11-26 ENCOUNTER — Other Ambulatory Visit: Payer: Self-pay | Admitting: Nurse Practitioner

## 2019-11-28 ENCOUNTER — Telehealth: Payer: Self-pay | Admitting: Pulmonary Disease

## 2019-11-28 DIAGNOSIS — J441 Chronic obstructive pulmonary disease with (acute) exacerbation: Secondary | ICD-10-CM

## 2019-11-28 MED ORDER — TRELEGY ELLIPTA 100-62.5-25 MCG/INH IN AEPB: 1.0000 | INHALATION_SPRAY | Freq: Every day | RESPIRATORY_TRACT | 3 refills | Status: DC

## 2019-11-28 NOTE — Telephone Encounter (Signed)
Trelegy refill sent Pt notified and reminded she needs f/u by end of May. Nothing further needed.

## 2019-12-06 DIAGNOSIS — J449 Chronic obstructive pulmonary disease, unspecified: Secondary | ICD-10-CM | POA: Diagnosis not present

## 2019-12-06 DIAGNOSIS — J309 Allergic rhinitis, unspecified: Secondary | ICD-10-CM | POA: Diagnosis not present

## 2019-12-06 DIAGNOSIS — R0600 Dyspnea, unspecified: Secondary | ICD-10-CM | POA: Diagnosis not present

## 2019-12-27 ENCOUNTER — Ambulatory Visit (INDEPENDENT_AMBULATORY_CARE_PROVIDER_SITE_OTHER): Payer: Medicare HMO | Admitting: Nurse Practitioner

## 2019-12-27 ENCOUNTER — Encounter: Payer: Self-pay | Admitting: Nurse Practitioner

## 2019-12-27 ENCOUNTER — Ambulatory Visit (INDEPENDENT_AMBULATORY_CARE_PROVIDER_SITE_OTHER): Payer: Medicare HMO

## 2019-12-27 ENCOUNTER — Other Ambulatory Visit: Payer: Self-pay

## 2019-12-27 VITALS — BP 116/80 | HR 98 | Temp 98.3°F | Wt 164.4 lb

## 2019-12-27 VITALS — BP 116/80 | HR 98 | Temp 98.3°F | Ht 63.0 in | Wt 164.0 lb

## 2019-12-27 DIAGNOSIS — G3184 Mild cognitive impairment, so stated: Secondary | ICD-10-CM | POA: Diagnosis not present

## 2019-12-27 DIAGNOSIS — J441 Chronic obstructive pulmonary disease with (acute) exacerbation: Secondary | ICD-10-CM | POA: Diagnosis not present

## 2019-12-27 DIAGNOSIS — L659 Nonscarring hair loss, unspecified: Secondary | ICD-10-CM

## 2019-12-27 DIAGNOSIS — R7303 Prediabetes: Secondary | ICD-10-CM

## 2019-12-27 DIAGNOSIS — E782 Mixed hyperlipidemia: Secondary | ICD-10-CM | POA: Diagnosis not present

## 2019-12-27 DIAGNOSIS — I10 Essential (primary) hypertension: Secondary | ICD-10-CM

## 2019-12-27 DIAGNOSIS — Z0000 Encounter for general adult medical examination without abnormal findings: Secondary | ICD-10-CM | POA: Diagnosis not present

## 2019-12-27 DIAGNOSIS — E559 Vitamin D deficiency, unspecified: Secondary | ICD-10-CM | POA: Diagnosis not present

## 2019-12-27 NOTE — Progress Notes (Signed)
Subjective:     Patient ID: [name] , female    DOB: August 19, 1941 , 79 y.o.   MRN: [id number]   Chief Complaint  Patient presents with  . Hypertension    HPI   Wt Readings from Last 3 Encounters: 12/27/19 : 164 lb 6.4 oz (74.6 kg) 07/16/19 : 154 lb 9.6 oz (70.1 kg) 06/28/19 : 155 lb 9.6 oz (70.6 kg)     Hypertension This is a chronic problem. The current episode started more than 1 year ago. The problem is unchanged. The problem is controlled. Pertinent negatives include no anxiety, chest pain, headaches or palpitations. There are no associated agents to hypertension. Risk factors for coronary artery disease include obesity and sedentary lifestyle. Treatments tried: no current medications. There are no compliance problems.  There is no history of angina. There is no history of chronic renal disease.     Past Medical History:  Diagnosis Date  . Arthritis   . Asthma   . COPD (chronic obstructive pulmonary disease) ([place])   . Depression   . GERD (gastroesophageal reflux disease)   . Hyperlipidemia   . Hypertension 06/15/2016  . left lung ca dx'd 2016  . Thyroid nodule      Family History  Problem Relation Age of Onset  . Renal cancer Brother   . Diabetes Mother   . Hypertension Mother   . Renal Disease Mother   . Healthy Father      Current Outpatient Medications:  .  albuterol (PROVENTIL) (2.5 MG/3ML) 0.083% nebulizer solution, Take 3 mLs (2.5 mg total) by nebulization every 6 (six) hours as needed for wheezing or shortness of breath., Disp: 75 mL, Rfl: 12 .  albuterol (VENTOLIN HFA) 108 (90 Base) MCG/ACT inhaler, INHALE 2 PUFFS INTO THE LUNGS EVERY 6 HOURS AS NEEDED FOR WHEEZING OR SHORTNESS OF BREATH., Disp: 6.7 g, Rfl: 2 .  aspirin 81 MG tablet, Take 81 mg by mouth every evening. , Disp: , Rfl:  .  Calcium Carbonate-Vitamin D 600-400 MG-UNIT tablet, Take 1 tablet by mouth daily. (Patient taking differently: Take 1 tablet by mouth daily. 600 mg of calcium and  500 units of vitd), Disp:  , Rfl:  .  cyclobenzaprine (FLEXERIL) 5 MG tablet, Take 1 tablet (5 mg total) by mouth 3 (three) times daily as needed for muscle spasms., Disp: 20 tablet, Rfl: 0 .  cycloSPORINE (RESTASIS) 0.05 % ophthalmic emulsion, 1 drop 2 (two) times daily., Disp: , Rfl:  .  donepezil (ARICEPT) 10 MG tablet, TAKE 1 TABLET BY MOUTH EVERY DAY IN THE EVENING, Disp: 90 tablet, Rfl: 1 .  ezetimibe (ZETIA) 10 MG tablet, TAKE 1 TABLET BY MOUTH EVERYDAY AT BEDTIME, Disp: 90 tablet, Rfl: 1 .  Fluticasone-Umeclidin-Vilant (TRELEGY ELLIPTA) 100-62.5-25 MCG/INH AEPB, Inhale 1 puff into the lungs daily., Disp: 60 each, Rfl: 3 .  KLOR-CON M20 20 MEQ tablet, TAKE 1 TABLET BY MOUTH EVERY DAY WITH FOOD, Disp: 90 tablet, Rfl: 1 .  Melatonin 2.5 MG CAPS, Take 2.5 mg by mouth at bedtime as needed (sleep). , Disp: , Rfl:  .  mirtazapine (REMERON) 15 MG tablet, TAKE 1 TABLET BY MOUTH EVERYDAY AT BEDTIME, Disp: 90 tablet, Rfl: 1 .  Multiple Vitamin (MULTIVITAMIN) tablet, Take 1 tablet by mouth daily., Disp: , Rfl:  .  niacin (NIASPAN) 500 MG CR tablet, TAKE 1 TABLET BY MOUTH EVERY DAY IN THE EVENING, Disp: 120 tablet, Rfl: 2 .  OVER THE COUNTER MEDICATION, Take 1 tablet by mouth daily.,  Disp: , Rfl:  .  pantoprazole (PROTONIX) 40 MG tablet, TAKE 1 TABLET BY MOUTH TWICE A DAY, Disp: 180 tablet, Rfl: 1 .  Probiotic Product (PROBIOTIC PO), Take by mouth., Disp: , Rfl:  .  valACYclovir (VALTREX) 500 MG tablet, TAKE 1 TABLET BY MOUTH EVERY DAY, Disp: 90 tablet, Rfl: 0 .  White Petrolatum-Mineral Oil (SYSTANE NIGHTTIME OP), Place 1 drop into both eyes at bedtime as needed (for dry eyes). , Disp: , Rfl:    No Known Allergies   Review of Systems  Constitutional: Negative for appetite change and fatigue.  HENT: Negative.   Respiratory: Negative.   Cardiovascular: Negative.  Negative for chest pain, palpitations and leg swelling.  Genitourinary: Negative.   Musculoskeletal: Negative.   Skin: Negative.    Neurological: Negative.  Negative for dizziness and headaches.  Psychiatric/Behavioral: Negative.      Today's Vitals   12/27/19 1033  BP: 116/80  Pulse: 98  Temp: 98.3 F (36.8 C)  TempSrc: Oral  Weight: 164 lb 6.4 oz (74.6 kg)  PainSc: 0-No pain   Body mass index is 29.12 kg/m.   Objective:  Physical Exam Vitals reviewed.  Constitutional:      General: She is not in acute distress.    Appearance: Normal appearance. She is well-developed. She is obese.  HENT:     Right Ear: Hearing normal.     Left Ear: Hearing normal.  Eyes:     General: Lids are normal.     Extraocular Movements: Extraocular movements intact.     Conjunctiva/sclera: Conjunctivae normal.     Pupils: Pupils are equal, round, and reactive to light.     Funduscopic exam:    Right eye: No papilledema.        Left eye: No papilledema.  Neck:     Thyroid: No thyroid mass.     Vascular: No carotid bruit.  Cardiovascular:     Rate and Rhythm: Normal rate and regular rhythm.     Pulses: Normal pulses.     Heart sounds: Normal heart sounds. No murmur.  Pulmonary:     Effort: Pulmonary effort is normal. No respiratory distress.     Breath sounds: Normal breath sounds.  Musculoskeletal:        General: No swelling. Normal range of motion.     Cervical back: Full passive range of motion without pain, normal range of motion and neck supple.     Right lower leg: No edema.     Left lower leg: No edema.  Skin:    General: Skin is warm and dry.     Capillary Refill: Capillary refill takes less than 2 seconds.     Comments: Hair thinning at front of crown area  Neurological:     General: No focal deficit present.     Mental Status: She is alert and oriented to person, place, and time.     Cranial Nerves: No cranial nerve deficit.     Sensory: No sensory deficit.  Psychiatric:        Mood and Affect: Mood normal.        Behavior: Behavior normal.        Thought Content: Thought content normal.         Judgment: Judgment normal.         Assessment And Plan:      1. Essential hypertension . B/P is well controlled  . No current medications  5. Prediabetes  Chronic, controlled  Diet controlled  Encouraged to limit intake of sugary foods and drinks  Encouraged to increase physical activity - BMP8+eGFR - Hemoglobin A1c  7. Mixed hyperlipidemia  Chronic, controlled  Continue with current medications - Lipid panel  6. Chronic obstructive pulmonary disease with acute exacerbation (HCC)  Stable, continue follow up with Pulmonary  8. Mild cognitive impairment  Stable, tolerating donepezil well   6. Vitamin D deficiency disease  Will check vitamin d level  - VITAMIN D 25 Hydroxy (Vit-D Deficiency, Fractures)  7. Hair thinning  She does have thinning hair at the frontal crown area  Will check thyroid levels   I do not see any medications on her list that would cause this. - TSH        [name], FNP    THE PATIENT IS ENCOURAGED TO PRACTICE SOCIAL DISTANCING DUE TO THE COVID-19 PANDEMIC.

## 2019-12-27 NOTE — Progress Notes (Signed)
This visit occurred during the SARS-CoV-2 public health emergency.  Safety protocols were in place, including screening questions prior to the visit, additional usage of staff PPE, and extensive cleaning of exam room while observing appropriate contact time as indicated for disinfecting solutions.  Subjective:   [name] is a 79 y.o. female who presents for Medicare Annual (Subsequent) preventive examination.  Review of Systems:  n/a Cardiac Risk Factors include: advanced age (>66men, >90 women);hypertension;sedentary lifestyle     Objective:     Vitals: BP 116/80   Pulse 98   Temp 98.3 F (36.8 C) (Oral)   Ht 5\' 3"  (1.6 m)   Wt 164 lb (74.4 kg)   BMI 29.05 kg/m   Body mass index is 29.05 kg/m.  Advanced Directives 12/27/2019 05/03/2019 02/21/2019 09/25/2018 06/14/2018 08/21/2017 06/04/2016  Does Patient Have a Medical Advance Directive? No No No No No No No  Type of Advance Directive - - - - - - -  Does patient want to make changes to medical advance directive? - - - - - - No - Patient declined  Would patient like information on creating a medical advance directive? No - Guardian declined - No - Patient declined No - Patient declined Yes (Inpatient - patient defers creating a medical advance directive at this time) - -    Tobacco Social History   Tobacco Use  Smoking Status Former Smoker  . Packs/day: 1.00  . Years: 56.00  . Pack years: 56.00  . Types: Cigarettes  . Quit date: 06/22/2015  . Years since quitting: 4.5  Smokeless Tobacco Never Used     Counseling given: Not Answered   Clinical Intake:  Pre-visit preparation completed: Yes  Pain : No/denies pain     Nutritional Status: BMI 25 -29 Overweight Nutritional Risks: None Diabetes: No  How often do you need to have someone help you when you read instructions, pamphlets, or other written materials from your doctor or pharmacy?: 1 - Never What is the last grade level you completed in school?: 12th  grade  Interpreter Needed?: No  Information entered by :: NAllen LPN  Past Medical History:  Diagnosis Date  . Arthritis   . Asthma   . COPD (chronic obstructive pulmonary disease) ([place])   . Depression   . GERD (gastroesophageal reflux disease)   . Hyperlipidemia   . Hypertension 06/15/2016  . left lung ca dx'd 2016  . Thyroid nodule    Past Surgical History:  Procedure Laterality Date  . BIOPSY THYROID    . CATARACT EXTRACTION     left eye  . CATARACT EXTRACTION W/ INTRAOCULAR LENS IMPLANT     RIGHT  . CESAREAN SECTION    . EYE SURGERY    . MASS EXCISION N/A 12/13/2014   Procedure: EXCISION OF VOCAL CORD POLYPS;  Surgeon: Jerrell Belfast, MD;  Location: [place];  Service: ENT;  Laterality: N/A;  . MICROLARYNGOSCOPY N/A 12/13/2014   Procedure: MICROLARYNGOSCOPY;  Surgeon: Jerrell Belfast, MD;  Location: [place];  Service: ENT;  Laterality: N/A;  . TUBAL LIGATION    . VIDEO ASSISTED THORACOSCOPY (VATS)/ LOBECTOMY Left 07/04/2015   Procedure: VIDEO ASSISTED THORACOSCOPY (VATS) LEFT UPPER LOBECTOMY;  Surgeon: Melrose Nakayama, MD;  Location: [place];  Service: Thoracic;  Laterality: Left;  Marland Kitchen VIDEO BRONCHOSCOPY WITH INSERTION OF INTERBRONCHIAL VALVE (IBV) N/A 07/11/2015   Procedure: VIDEO BRONCHOSCOPY WITH INSERTION OF INTERBRONCHIAL VALVE (IBV);  Surgeon: Melrose Nakayama, MD;  Location: [place];  Service: Thoracic;  Laterality: N/A;  . VIDEO BRONCHOSCOPY WITH INSERTION OF INTERBRONCHIAL VALVE (IBV) N/A 09/10/2015   Procedure: VIDEO BRONCHOSCOPY WITH REMOVAL OF INTERBRONCHIAL VALVES;  Surgeon: Melrose Nakayama, MD;  [facility];  Service: Thoracic;  Laterality: N/A;   Family History  Problem Relation Age of Onset  . Renal cancer Brother   . Diabetes Mother   . Hypertension Mother   . Renal Disease Mother   . Healthy Father    Social History   Socioeconomic History  . Marital status: Widowed    Spouse name: Not on file  . Number of children: 5  . Years of education:  Not on file  . Highest education level: Not on file  Occupational History  . Occupation: retired  Tobacco Use  . Smoking status: Former Smoker    Packs/day: 1.00    Years: 56.00    Pack years: 56.00    Types: Cigarettes    Quit date: 06/22/2015    Years since quitting: 4.5  . Smokeless tobacco: Never Used  Substance and Sexual Activity  . Alcohol use: No    Alcohol/week: 0.0 standard drinks  . Drug use: No  . Sexual activity: Not Currently  Other Topics Concern  . Not on file  Social History Narrative  . Not on file   Social Determinants of Health   Financial Resource Strain: Low Risk   . Difficulty of Paying Living Expenses: Not hard at all  Food Insecurity: No Food Insecurity  . Worried About Charity fundraiser in the Last Year: Never true  . Ran Out of Food in the Last Year: Never true  Transportation Needs: No Transportation Needs  . Lack of Transportation (Medical): No  . Lack of Transportation (Non-Medical): No  Physical Activity: Insufficiently Active  . Days of Exercise per Week: 2 days  . Minutes of Exercise per Session: 10 min  Stress: No Stress Concern Present  . Feeling of Stress : Not at all  Social Connections:   . Frequency of Communication with Friends and Family:   . Frequency of Social Gatherings with Friends and Family:   . Attends Religious Services:   . Active Member of Clubs or Organizations:   . Attends Archivist Meetings:   Marland Kitchen Marital Status:     Outpatient Encounter Medications as of 12/27/2019  Medication Sig  . albuterol (PROVENTIL) (2.5 MG/3ML) 0.083% nebulizer solution Take 3 mLs (2.5 mg total) by nebulization every 6 (six) hours as needed for wheezing or shortness of breath.  Marland Kitchen albuterol (VENTOLIN HFA) 108 (90 Base) MCG/ACT inhaler INHALE 2 PUFFS INTO THE LUNGS EVERY 6 HOURS AS NEEDED FOR WHEEZING OR SHORTNESS OF BREATH.  Marland Kitchen aspirin 81 MG tablet Take 81 mg by mouth every evening.   . Calcium Carbonate-Vitamin D 600-400 MG-UNIT  tablet Take 1 tablet by mouth daily. (Patient taking differently: Take 1 tablet by mouth daily. 600 mg of calcium and 500 units of vitd)  . cyclobenzaprine (FLEXERIL) 5 MG tablet Take 1 tablet (5 mg total) by mouth 3 (three) times daily as needed for muscle spasms.  . cycloSPORINE (RESTASIS) 0.05 % ophthalmic emulsion 1 drop 2 (two) times daily.  Marland Kitchen donepezil (ARICEPT) 10 MG tablet TAKE 1 TABLET BY MOUTH EVERY DAY IN THE EVENING  . ezetimibe (ZETIA) 10 MG tablet TAKE 1 TABLET BY MOUTH EVERYDAY AT BEDTIME  . Fluticasone-Umeclidin-Vilant (TRELEGY ELLIPTA) 100-62.5-25 MCG/INH AEPB Inhale 1 puff into the lungs daily.  Marland Kitchen KLOR-CON M20 20 MEQ tablet TAKE 1  TABLET BY MOUTH EVERY DAY WITH FOOD  . Melatonin 2.5 MG CAPS Take 2.5 mg by mouth at bedtime as needed (sleep).   . mirtazapine (REMERON) 15 MG tablet TAKE 1 TABLET BY MOUTH EVERYDAY AT BEDTIME  . Multiple Vitamin (MULTIVITAMIN) tablet Take 1 tablet by mouth daily.  . niacin (NIASPAN) 500 MG CR tablet TAKE 1 TABLET BY MOUTH EVERY DAY IN THE EVENING  . OVER THE COUNTER MEDICATION Take 1 tablet by mouth daily.  . pantoprazole (PROTONIX) 40 MG tablet TAKE 1 TABLET BY MOUTH TWICE A DAY  . Probiotic Product (PROBIOTIC PO) Take by mouth.  . valACYclovir (VALTREX) 500 MG tablet TAKE 1 TABLET BY MOUTH EVERY DAY  . White Petrolatum-Mineral Oil (SYSTANE NIGHTTIME OP) Place 1 drop into both eyes at bedtime as needed (for dry eyes).    No facility-administered encounter medications on file as of 12/27/2019.    Activities of Daily Living In your present state of health, do you have any difficulty performing the following activities: 12/27/2019 05/03/2019  Hearing? N N  Vision? N N  Difficulty concentrating or making decisions? N N  Walking or climbing stairs? N N  Dressing or bathing? N N  Doing errands, shopping? N N  Preparing Food and eating ? N N  Using the Toilet? N N  In the past six months, have you accidently leaked urine? Y Y  Comment sometimes  with coughing sometimes with a hard cough  Do you have problems with loss of bowel control? N N  Managing your Medications? N N  Managing your Finances? N N  Housekeeping or managing your Housekeeping? N N  Some recent data might be hidden    Patient Care Team: [name], FNP as PCP - General (General Practice) Monna Fam, MD as Consulting Physician (Ophthalmology) Rigoberto Noel, MD as Consulting Physician (Pulmonary Disease) Melrose Nakayama, MD as Consulting Physician (Cardiothoracic Surgery) Curt Bears, MD as Consulting Physician (Oncology)    Assessment:   This is a routine wellness examination for [facility].  Exercise Activities and Dietary recommendations Current Exercise Habits: Home exercise routine, Type of exercise: walking, Time (Minutes): 15, Frequency (Times/Week): 2, Weekly Exercise (Minutes/Week): 30  Goals    . Have 2 meals a day (pt-stated)     Wants to eat more healthy. Has been nibbling through out the day. Has only been eating 1 meal a day twice or three times a week.    . Patient Stated     05/03/2019, not to have any new health issues    . Weight (lb) < 200 lb (90.7 kg)     12/27/2019, wants to weigh 140 pounds       Fall Risk Fall Risk  12/27/2019 12/27/2019 06/28/2019 05/03/2019 02/15/2019  Falls in the past year? 0 0 0 0 0  Risk for fall due to : Medication side effect - - Medication side effect -  Follow up Falls evaluation completed;Education provided;Falls prevention discussed - - Falls evaluation completed;Education provided;Falls prevention discussed -   Is the patient's home free of loose throw rugs in walkways, pet beds, electrical cords, etc?   yes      Grab bars in the bathroom? yes      Handrails on the stairs?   yes      Adequate lighting?   yes  Timed Get Up and Go performed: n/a  Depression Screen PHQ 2/9 Scores 12/27/2019 12/27/2019 06/28/2019 05/03/2019  PHQ - 2 Score 0 0 0 0  PHQ-  9 Score 2 - - 3     Cognitive  Function     6CIT Screen 12/27/2019 05/03/2019 06/14/2018  What Year? 0 points 0 points 0 points  What month? 0 points 0 points 0 points  What time? 3 points 0 points 0 points  Count back from 20 0 points 0 points 0 points  Months in reverse 0 points 0 points 0 points  Repeat phrase 0 points 0 points 0 points  Total Score 3 0 0    Immunization History  Administered Date(s) Administered  . Influenza, High Dose Seasonal PF 06/06/2016, 05/01/2017, 05/31/2018, 06/28/2019  . Influenza, Quadrivalent, Recombinant, Inj, Pf 06/03/2019  . Influenza-Unspecified 05/01/2017  . Moderna SARS-COVID-2 Vaccination 10/19/2019, 11/16/2019  . Pneumococcal Conjugate-13 07/17/2014, 06/15/2018  . Pneumococcal Polysaccharide-23 09/07/2011    Qualifies for Shingles Vaccine? yes  Screening Tests Health Maintenance  Topic Date Due  . DEXA SCAN  Never done  . URINE MICROALBUMIN  02/15/2020  . INFLUENZA VACCINE  04/06/2020  . MAMMOGRAM  07/11/2020  . TETANUS/TDAP  10/30/2022  . COVID-19 Vaccine  Completed  . PNA vac Low Risk Adult  Completed    Cancer Screenings: Lung: Low Dose CT Chest recommended if Age 27-80 years, 30 pack-year currently smoking OR have quit w/in 15years. Patient does not qualify. Breast:  Up to date on Mammogram? Yes   Up to date of Bone Density/Dexa? Yes Colorectal: not required  Additional Screenings: : Hepatitis C Screening: n/a     Plan:    Patient wants to get to 140 pounds.   I have personally reviewed and noted the following in the patient's chart:   . Medical and social history . Use of alcohol, tobacco or illicit drugs  . Current medications and supplements . Functional ability and status . Nutritional status . Physical activity . Advanced directives . List of other physicians . Hospitalizations, surgeries, and ER visits in previous 12 months . Vitals . Screenings to include cognitive, depression, and falls . Referrals and appointments  In addition, I  have reviewed and discussed with patient certain preventive protocols, quality metrics, and best practice recommendations. A written personalized care plan for preventive services as well as general preventive health recommendations were provided to patient.     Kellie Simmering, LPN  [id number]

## 2019-12-27 NOTE — Patient Instructions (Addendum)
Ms. [name] , Thank you for taking time to come for your Medicare Wellness Visit. I appreciate your ongoing commitment to your health goals. Please review the following plan we discussed and let me know if I can assist you in the future.   Screening recommendations/referrals: Colonoscopy: not required Mammogram: 07/2019 Bone Density: 06/2015 Recommended yearly ophthalmology/optometry visit for glaucoma screening and checkup Recommended yearly dental visit for hygiene and checkup  Vaccinations: Influenza vaccine: 06/2019 Pneumococcal vaccine: 06/2018 Tdap vaccine: 10/2012 Shingles vaccine: discussed    Advanced directives: Advance directive discussed with you today. Even though you declined this today please call our office should you change your mind and we can give you the proper paperwork for you to fill out.   Conditions/risks identified: overweight  Next appointment: 07/01/2020 at 11:00   Preventive Care 10 Years and Older, Female Preventive care refers to lifestyle choices and visits with your health care provider that can promote health and wellness. What does preventive care include?  A yearly physical exam. This is also called an annual well check.  Dental exams once or twice a year.  Routine eye exams. Ask your health care provider how often you should have your eyes checked.  Personal lifestyle choices, including:  Daily care of your teeth and gums.  Regular physical activity.  Eating a healthy diet.  Avoiding tobacco and drug use.  Limiting alcohol use.  Practicing safe sex.  Taking low-dose aspirin every day.  Taking vitamin and mineral supplements as recommended by your health care provider. What happens during an annual well check? The services and screenings done by your health care provider during your annual well check will depend on your age, overall health, lifestyle risk factors, and family history of disease. Counseling  Your health care  provider may ask you questions about your:  Alcohol use.  Tobacco use.  Drug use.  Emotional well-being.  Home and relationship well-being.  Sexual activity.  Eating habits.  History of falls.  Memory and ability to understand (cognition).  Work and work Statistician.  Reproductive health. Screening  You may have the following tests or measurements:  Height, weight, and BMI.  Blood pressure.  Lipid and cholesterol levels. These may be checked every 5 years, or more frequently if you are over 73 years old.  Skin check.  Lung cancer screening. You may have this screening every year starting at age 25 if you have a 30-pack-year history of smoking and currently smoke or have quit within the past 15 years.  Fecal occult blood test (FOBT) of the stool. You may have this test every year starting at age 8.  Flexible sigmoidoscopy or colonoscopy. You may have a sigmoidoscopy every 5 years or a colonoscopy every 10 years starting at age 43.  Hepatitis C blood test.  Hepatitis B blood test.  Sexually transmitted disease (STD) testing.  Diabetes screening. This is done by checking your blood sugar (glucose) after you have not eaten for a while (fasting). You may have this done every 1-3 years.  Bone density scan. This is done to screen for osteoporosis. You may have this done starting at age 21.  Mammogram. This may be done every 1-2 years. Talk to your health care provider about how often you should have regular mammograms. Talk with your health care provider about your test results, treatment options, and if necessary, the need for more tests. Vaccines  Your health care provider may recommend certain vaccines, such as:  Influenza vaccine. This is recommended  every year.  Tetanus, diphtheria, and acellular pertussis (Tdap, Td) vaccine. You may need a Td booster every 10 years.  Zoster vaccine. You may need this after age 45.  Pneumococcal 13-valent conjugate (PCV13)  vaccine. One dose is recommended after age 72.  Pneumococcal polysaccharide (PPSV23) vaccine. One dose is recommended after age 77. Talk to your health care provider about which screenings and vaccines you need and how often you need them. This information is not intended to replace advice given to you by your health care provider. Make sure you discuss any questions you have with your health care provider. Document Released: 09/19/2015 Document Revised: 05/12/2016 Document Reviewed: 06/24/2015 Elsevier Interactive Patient Education  2017 [place] Prevention in the Home Falls can cause injuries. They can happen to people of all ages. There are many things you can do to make your home safe and to help prevent falls. What can I do on the outside of my home?  Regularly fix the edges of walkways and driveways and fix any cracks.  Remove anything that might make you trip as you walk through a door, such as a raised step or threshold.  Trim any bushes or trees on the path to your home.  Use bright outdoor lighting.  Clear any walking paths of anything that might make someone trip, such as rocks or tools.  Regularly check to see if handrails are loose or broken. Make sure that both sides of any steps have handrails.  Any raised decks and porches should have guardrails on the edges.  Have any leaves, snow, or ice cleared regularly.  Use sand or salt on walking paths during winter.  Clean up any spills in your garage right away. This includes oil or grease spills. What can I do in the bathroom?  Use night lights.  Install grab bars by the toilet and in the tub and shower. Do not use towel bars as grab bars.  Use non-skid mats or decals in the tub or shower.  If you need to sit down in the shower, use a plastic, non-slip stool.  Keep the floor dry. Clean up any water that spills on the floor as soon as it happens.  Remove soap buildup in the tub or shower regularly.   Attach bath mats securely with double-sided non-slip rug tape.  Do not have throw rugs and other things on the floor that can make you trip. What can I do in the bedroom?  Use night lights.  Make sure that you have a light by your bed that is easy to reach.  Do not use any sheets or blankets that are too big for your bed. They should not hang down onto the floor.  Have a firm chair that has side arms. You can use this for support while you get dressed.  Do not have throw rugs and other things on the floor that can make you trip. What can I do in the kitchen?  Clean up any spills right away.  Avoid walking on wet floors.  Keep items that you use a lot in easy-to-reach places.  If you need to reach something above you, use a strong step stool that has a grab bar.  Keep electrical cords out of the way.  Do not use floor polish or wax that makes floors slippery. If you must use wax, use non-skid floor wax.  Do not have throw rugs and other things on the floor that can make you trip. What  can I do with my stairs?  Do not leave any items on the stairs.  Make sure that there are handrails on both sides of the stairs and use them. Fix handrails that are broken or loose. Make sure that handrails are as long as the stairways.  Check any carpeting to make sure that it is firmly attached to the stairs. Fix any carpet that is loose or worn.  Avoid having throw rugs at the top or bottom of the stairs. If you do have throw rugs, attach them to the floor with carpet tape.  Make sure that you have a light switch at the top of the stairs and the bottom of the stairs. If you do not have them, ask someone to add them for you. What else can I do to help prevent falls?  Wear shoes that:  Do not have high heels.  Have rubber bottoms.  Are comfortable and fit you well.  Are closed at the toe. Do not wear sandals.  If you use a stepladder:  Make sure that it is fully opened. Do not climb  a closed stepladder.  Make sure that both sides of the stepladder are locked into place.  Ask someone to hold it for you, if possible.  Clearly mark and make sure that you can see:  Any grab bars or handrails.  First and last steps.  Where the edge of each step is.  Use tools that help you move around (mobility aids) if they are needed. These include:  Canes.  Walkers.  Scooters.  Crutches.  Turn on the lights when you go into a dark area. Replace any light bulbs as soon as they burn out.  Set up your furniture so you have a clear path. Avoid moving your furniture around.  If any of your floors are uneven, fix them.  If there are any pets around you, be aware of where they are.  Review your medicines with your doctor. Some medicines can make you feel dizzy. This can increase your chance of falling. Ask your doctor what other things that you can do to help prevent falls. This information is not intended to replace advice given to you by your health care provider. Make sure you discuss any questions you have with your health care provider. Document Released: 06/19/2009 Document Revised: 01/29/2016 Document Reviewed: 09/27/2014 Elsevier Interactive Patient Education  2017 [employer].

## 2019-12-28 LAB — CMP14+EGFR
ALT: 24 IU/L (ref 0–32)
AST: 34 IU/L (ref 0–40)
Albumin/Globulin Ratio: 1.6 (ref 1.2–2.2)
Albumin: 4.4 g/dL (ref 3.7–4.7)
Alkaline Phosphatase: 90 IU/L (ref 39–117)
BUN/Creatinine Ratio: 10 — ABNORMAL LOW (ref 12–28)
BUN: 13 mg/dL (ref 8–27)
Bilirubin Total: 0.7 mg/dL (ref 0.0–1.2)
CO2: 27 mmol/L (ref 20–29)
Calcium: 10 mg/dL (ref 8.7–10.3)
Chloride: 101 mmol/L (ref 96–106)
Creatinine, Ser: 1.26 mg/dL — ABNORMAL HIGH (ref 0.57–1.00)
GFR calc Af Amer: 47 mL/min/{1.73_m2} — ABNORMAL LOW (ref >59)
GFR calc non Af Amer: 41 mL/min/{1.73_m2} — ABNORMAL LOW (ref >59)
Globulin, Total: 2.7 g/dL (ref 1.5–4.5)
Glucose: 112 mg/dL — ABNORMAL HIGH (ref 65–99)
Potassium: 4.3 mmol/L (ref 3.5–5.2)
Sodium: 143 mmol/L (ref 134–144)
Total Protein: 7.1 g/dL (ref 6.0–8.5)

## 2019-12-28 LAB — TSH: TSH: 2.63 u[IU]/mL (ref 0.450–4.500)

## 2019-12-28 LAB — LIPID PANEL
Chol/HDL Ratio: 5.1 ratio — ABNORMAL HIGH (ref 0.0–4.4)
Cholesterol, Total: 199 mg/dL (ref 100–199)
HDL: 39 mg/dL — ABNORMAL LOW (ref >39)
LDL Chol Calc (NIH): 113 mg/dL — ABNORMAL HIGH (ref 0–99)
Triglycerides: 273 mg/dL — ABNORMAL HIGH (ref 0–149)
VLDL Cholesterol Cal: 47 mg/dL — ABNORMAL HIGH (ref 5–40)

## 2019-12-28 LAB — HEMOGLOBIN A1C
Est. average glucose Bld gHb Est-mCnc: 148 mg/dL
Hgb A1c MFr Bld: 6.8 % — ABNORMAL HIGH (ref 4.8–5.6)

## 2019-12-28 LAB — VITAMIN D 25 HYDROXY (VIT D DEFICIENCY, FRACTURES): Vit D, 25-Hydroxy: 43.5 ng/mL (ref 30.0–100.0)

## 2020-01-14 ENCOUNTER — Other Ambulatory Visit: Payer: Self-pay

## 2020-01-14 ENCOUNTER — Encounter: Payer: Self-pay | Admitting: Adult Health

## 2020-01-14 ENCOUNTER — Ambulatory Visit (INDEPENDENT_AMBULATORY_CARE_PROVIDER_SITE_OTHER): Payer: Medicare HMO | Admitting: Adult Health

## 2020-01-14 DIAGNOSIS — J301 Allergic rhinitis due to pollen: Secondary | ICD-10-CM | POA: Diagnosis not present

## 2020-01-14 DIAGNOSIS — C3492 Malignant neoplasm of unspecified part of left bronchus or lung: Secondary | ICD-10-CM | POA: Diagnosis not present

## 2020-01-14 DIAGNOSIS — J441 Chronic obstructive pulmonary disease with (acute) exacerbation: Secondary | ICD-10-CM | POA: Diagnosis not present

## 2020-01-14 MED ORDER — TRELEGY ELLIPTA 100-62.5-25 MCG/INH IN AEPB: 1.0000 | INHALATION_SPRAY | Freq: Every day | RESPIRATORY_TRACT | 5 refills | Status: DC

## 2020-01-14 MED ORDER — ALBUTEROL SULFATE HFA 108 (90 BASE) MCG/ACT IN AERS: INHALATION_SPRAY | RESPIRATORY_TRACT | 2 refills | Status: DC

## 2020-01-14 NOTE — Assessment & Plan Note (Signed)
Currently under good control.  No flareup.  Patient advised to advance activity as tolerated  Plan  Patient Instructions  Continue on TRELEGY daily . Rinse after use.  Albuterol inhaler or Neb As needed   Continue on Zyrtec 10mg  At bedtime As needed    Saline nasal rinses As needed   Continue on Flonase daily As needed   Follow up with Dr. Elsworth Soho  In 6 months and  and As needed   Please contact office for sooner follow up if symptoms do not improve or worsen or seek emergency care     n

## 2020-01-14 NOTE — Assessment & Plan Note (Signed)
CT chest stable in September 2020.  Continue to follow with Dr. Roxan Hockey for planned CT and September 2021.

## 2020-01-14 NOTE — Progress Notes (Signed)
@Patient  ID: [name], female    DOB: 1941/08/14, 79 y.o.   MRN: [id number]  Chief Complaint  Patient presents with  . Follow-up    COPD     Referring provider: [name], FNP  HPI: 79 year old former smoker followed for COPD and lung cancer October 2016 underwent left VATS with left upper lobectomy and mediastinal lymph node dissection path showed invasive adenocarcinoma, adenocarcinoma involving the visceral pleura in addition to the lymphovascular invasion.  Resection margins as well as dissected lymph nodes were negative for malignancy , persistent air leak postop and required endobronchial valves for resolution  TEST/EVENTS :  05/2019 CT chest >> stable areas of scarring left upper lobe postsurgical, right lower lobe nodular scarring 12/2017 CT chest >>stable, nodular area of left lung scarring 2016Pre-op PFT - FEV1 73 %, DLCO 48%  03/2016 FEV1 66%  Spirometry2/2019shows an FEV1 at 63% ratio 57 and FVC of 86%.  CT chest September 2020 showed stable nodular areas of architectural distortion in the lungs bilaterally most compatible with areas of chronic postinfectious scarring.  No evidence of recurrent or metastatic disease in the thorax.  Moderate emphysema.   01/14/2020 Follow up : COPD , Lung cancer  Patient returns for a 3-month follow-up.  Patient says overall she is doing well.  She denies any increased shortness of breath.  Does get short of breath with minimal  activity. Able to do light house chores with frequently . No flare in cough or wheezing. Nephew lives with her and helps. Able to drive . Not on oxygen, O2 sats today on room air 95%. .   Remains on Trelegy inhaler daily. Received both covid vaccines.   Patient had her CT chest September 2020 that showed no evidence of recurrent or metastatic disease in the thorax. Has planned CT in 05/2020. Followed by Dr. Roxan Hockey.      No Known Allergies  Immunization History  Administered Date(s)  Administered  . Influenza, High Dose Seasonal PF 06/06/2016, 05/01/2017, 05/31/2018, 06/28/2019  . Influenza, Quadrivalent, Recombinant, Inj, Pf 06/03/2019  . Influenza-Unspecified 05/01/2017  . Moderna SARS-COVID-2 Vaccination 10/19/2019, 11/16/2019  . Pneumococcal Conjugate-13 07/17/2014, 06/15/2018  . Pneumococcal Polysaccharide-23 09/07/2011    Past Medical History:  Diagnosis Date  . Arthritis   . Asthma   . COPD (chronic obstructive pulmonary disease) ([place])   . Depression   . GERD (gastroesophageal reflux disease)   . Hyperlipidemia   . Hypertension 06/15/2016  . left lung ca dx'd 2016  . Thyroid nodule     Tobacco History: Social History   Tobacco Use  Smoking Status Former Smoker  . Packs/day: 1.00  . Years: 56.00  . Pack years: 56.00  . Types: Cigarettes  . Quit date: 06/22/2015  . Years since quitting: 4.5  Smokeless Tobacco Never Used   Counseling given: Not Answered   Outpatient Medications Prior to Visit  Medication Sig Dispense Refill  . albuterol (PROVENTIL) (2.5 MG/3ML) 0.083% nebulizer solution Take 3 mLs (2.5 mg total) by nebulization every 6 (six) hours as needed for wheezing or shortness of breath. 75 mL 12  . aspirin 81 MG tablet Take 81 mg by mouth every evening.     . Calcium Carbonate-Vitamin D 600-400 MG-UNIT tablet Take 1 tablet by mouth daily. (Patient taking differently: Take 1 tablet by mouth daily. 600 mg of calcium and 500 units of vitd)    . cyclobenzaprine (FLEXERIL) 5 MG tablet Take 1 tablet (5 mg total) by mouth 3 (three) times daily  as needed for muscle spasms. 20 tablet 0  . cycloSPORINE (RESTASIS) 0.05 % ophthalmic emulsion 1 drop 2 (two) times daily.    Marland Kitchen donepezil (ARICEPT) 10 MG tablet TAKE 1 TABLET BY MOUTH EVERY DAY IN THE EVENING 90 tablet 1  . ezetimibe (ZETIA) 10 MG tablet TAKE 1 TABLET BY MOUTH EVERYDAY AT BEDTIME 90 tablet 1  . KLOR-CON M20 20 MEQ tablet TAKE 1 TABLET BY MOUTH EVERY DAY WITH FOOD 90 tablet 1  . Melatonin  2.5 MG CAPS Take 2.5 mg by mouth at bedtime as needed (sleep).     . mirtazapine (REMERON) 15 MG tablet TAKE 1 TABLET BY MOUTH EVERYDAY AT BEDTIME 90 tablet 1  . Multiple Vitamin (MULTIVITAMIN) tablet Take 1 tablet by mouth daily.    . niacin (NIASPAN) 500 MG CR tablet TAKE 1 TABLET BY MOUTH EVERY DAY IN THE EVENING 120 tablet 2  . OVER THE COUNTER MEDICATION Take 1 tablet by mouth daily.    . pantoprazole (PROTONIX) 40 MG tablet TAKE 1 TABLET BY MOUTH TWICE A DAY 180 tablet 1  . Probiotic Product (PROBIOTIC PO) Take by mouth.    . valACYclovir (VALTREX) 500 MG tablet TAKE 1 TABLET BY MOUTH EVERY DAY 90 tablet 0  . White Petrolatum-Mineral Oil (SYSTANE NIGHTTIME OP) Place 1 drop into both eyes at bedtime as needed (for dry eyes).     Marland Kitchen albuterol (VENTOLIN HFA) 108 (90 Base) MCG/ACT inhaler INHALE 2 PUFFS INTO THE LUNGS EVERY 6 HOURS AS NEEDED FOR WHEEZING OR SHORTNESS OF BREATH. 6.7 g 2  . Fluticasone-Umeclidin-Vilant (TRELEGY ELLIPTA) 100-62.5-25 MCG/INH AEPB Inhale 1 puff into the lungs daily. 60 each 3   No facility-administered medications prior to visit.     Review of Systems:   Constitutional:   No  weight loss, night sweats,  Fevers, chills, fatigue, or  lassitude.  HEENT:   No headaches,  Difficulty swallowing,  Tooth/dental problems, or  Sore throat,                No sneezing, itching, ear ache, nasal congestion, post nasal drip,   CV:  No chest pain,  Orthopnea, PND, swelling in lower extremities, anasarca, dizziness, palpitations, syncope.   GI  No heartburn, indigestion, abdominal pain, nausea, vomiting, diarrhea, change in bowel habits, loss of appetite, bloody stools.   Resp: No shortness of breath with exertion or at rest.  No excess mucus, no productive cough,  No non-productive cough,  No coughing up of blood.  No change in color of mucus.  No wheezing.  No chest wall deformity  Skin: no rash or lesions.  GU: no dysuria, change in color of urine, no urgency or  frequency.  No flank pain, no hematuria   MS:  No joint pain or swelling.  No decreased range of motion.  No back pain.    Physical Exam  BP 140/80 (BP Location: Left Arm, Patient Position: Sitting, Cuff Size: Normal)   Pulse 99   Temp (!) 97.2 F (36.2 C) (Temporal)   Ht 5\' 3"  (1.6 m)   Wt 162 lb 12.8 oz (73.8 kg)   SpO2 96%   BMI 28.84 kg/m   GEN: A/Ox3; pleasant , NAD, well nourished    HEENT:  [place]/AT,    NOSE-clear, THROAT-clear, no lesions, no postnasal drip or exudate noted.   NECK:  Supple w/ fair ROM; no JVD; normal carotid impulses w/o bruits; no thyromegaly or nodules palpated; no lymphadenopathy.    RESP  Clear  P & A; w/o, wheezes/ rales/ or rhonchi. no accessory muscle use, no dullness to percussion  CARD:  RRR, no m/r/g, no peripheral edema, pulses intact, no cyanosis or clubbing.  GI:   Soft & nt; nml bowel sounds; no organomegaly or masses detected.   Musco: Warm bil, no deformities or joint swelling noted.   Neuro: alert, no focal deficits noted.    Skin: Warm, no lesions or rashes    Lab Results:  CBC  BMET  BNP No results found for: BNP  ProBNP No results found for: PROBNP  Imaging: No results found.    PFT Results Latest Ref Rng & Units 06/09/2015  FVC-Pre L 2.04  FVC-Predicted Pre % 93  FVC-Post L 2.02  FVC-Predicted Post % 92  Pre FEV1/FVC % % 60  Post FEV1/FCV % % 62  FEV1-Pre L 1.22  FEV1-Predicted Pre % 72  FEV1-Post L 1.24  DLCO UNC% % 48  DLCO COR %Predicted % 49  TLC L 4.77  TLC % Predicted % 95  RV % Predicted % 111    No results found for: NITRICOXIDE      Assessment & Plan:   COPD (chronic obstructive pulmonary disease) Currently under good control.  No flareup.  Patient advised to advance activity as tolerated  Plan  Patient Instructions  Continue on TRELEGY daily . Rinse after use.  Albuterol inhaler or Neb As needed   Continue on Zyrtec 10mg  At bedtime As needed    Saline nasal rinses As needed    Continue on Flonase daily As needed   Follow up with Dr. Elsworth Soho  In 6 months and  and As needed   Please contact office for sooner follow up if symptoms do not improve or worsen or seek emergency care     n   Rhinitis, allergic Flonase And Zyrtec as needed  Adenocarcinoma of left lung [facility]) CT chest stable in September 2020.  Continue to follow with Dr. Roxan Hockey for planned CT and September 2021.     Rexene Edison, NP 01/14/2020

## 2020-01-14 NOTE — Assessment & Plan Note (Signed)
Flonase And Zyrtec as needed

## 2020-01-14 NOTE — Patient Instructions (Signed)
Continue on TRELEGY daily . Rinse after use.  Albuterol inhaler or Neb As needed   Continue on Zyrtec 10mg  At bedtime As needed    Saline nasal rinses As needed   Continue on Flonase daily As needed   Follow up with Dr. Elsworth Soho  In 6 months and  and As needed   Please contact office for sooner follow up if symptoms do not improve or worsen or seek emergency care

## 2020-01-15 DIAGNOSIS — J309 Allergic rhinitis, unspecified: Secondary | ICD-10-CM | POA: Diagnosis not present

## 2020-01-15 DIAGNOSIS — R0600 Dyspnea, unspecified: Secondary | ICD-10-CM | POA: Diagnosis not present

## 2020-01-15 DIAGNOSIS — J449 Chronic obstructive pulmonary disease, unspecified: Secondary | ICD-10-CM | POA: Diagnosis not present

## 2020-02-15 DIAGNOSIS — R0600 Dyspnea, unspecified: Secondary | ICD-10-CM | POA: Diagnosis not present

## 2020-02-15 DIAGNOSIS — J449 Chronic obstructive pulmonary disease, unspecified: Secondary | ICD-10-CM | POA: Diagnosis not present

## 2020-02-15 DIAGNOSIS — J309 Allergic rhinitis, unspecified: Secondary | ICD-10-CM | POA: Diagnosis not present

## 2020-02-18 ENCOUNTER — Other Ambulatory Visit: Payer: Self-pay | Admitting: Nurse Practitioner

## 2020-02-20 ENCOUNTER — Other Ambulatory Visit: Payer: Self-pay

## 2020-02-20 MED ORDER — NIACIN ER (ANTIHYPERLIPIDEMIC) 500 MG PO TBCR: EXTENDED_RELEASE_TABLET | ORAL | 2 refills | Status: DC

## 2020-02-24 ENCOUNTER — Other Ambulatory Visit: Payer: Self-pay | Admitting: Nurse Practitioner

## 2020-02-26 DIAGNOSIS — H04123 Dry eye syndrome of bilateral lacrimal glands: Secondary | ICD-10-CM | POA: Diagnosis not present

## 2020-02-26 DIAGNOSIS — H35033 Hypertensive retinopathy, bilateral: Secondary | ICD-10-CM | POA: Diagnosis not present

## 2020-02-26 DIAGNOSIS — Z961 Presence of intraocular lens: Secondary | ICD-10-CM | POA: Diagnosis not present

## 2020-02-26 DIAGNOSIS — H35363 Drusen (degenerative) of macula, bilateral: Secondary | ICD-10-CM | POA: Diagnosis not present

## 2020-02-26 DIAGNOSIS — H26493 Other secondary cataract, bilateral: Secondary | ICD-10-CM | POA: Diagnosis not present

## 2020-02-26 LAB — HM DIABETES EYE EXAM

## 2020-02-28 ENCOUNTER — Encounter: Payer: Self-pay | Admitting: Nurse Practitioner

## 2020-03-01 ENCOUNTER — Other Ambulatory Visit: Payer: Self-pay | Admitting: Nurse Practitioner

## 2020-03-16 DIAGNOSIS — J309 Allergic rhinitis, unspecified: Secondary | ICD-10-CM | POA: Diagnosis not present

## 2020-03-16 DIAGNOSIS — R0600 Dyspnea, unspecified: Secondary | ICD-10-CM | POA: Diagnosis not present

## 2020-03-16 DIAGNOSIS — J449 Chronic obstructive pulmonary disease, unspecified: Secondary | ICD-10-CM | POA: Diagnosis not present

## 2020-03-18 ENCOUNTER — Other Ambulatory Visit: Payer: Self-pay | Admitting: Adult Health

## 2020-03-18 DIAGNOSIS — J441 Chronic obstructive pulmonary disease with (acute) exacerbation: Secondary | ICD-10-CM

## 2020-03-18 MED ORDER — TRELEGY ELLIPTA 100-62.5-25 MCG/INH IN AEPB: 1.0000 | INHALATION_SPRAY | Freq: Every day | RESPIRATORY_TRACT | 5 refills | Status: DC

## 2020-03-31 ENCOUNTER — Other Ambulatory Visit: Payer: Self-pay

## 2020-03-31 ENCOUNTER — Encounter: Payer: Self-pay | Admitting: Nurse Practitioner

## 2020-03-31 ENCOUNTER — Ambulatory Visit (INDEPENDENT_AMBULATORY_CARE_PROVIDER_SITE_OTHER): Payer: Medicare HMO | Admitting: Nurse Practitioner

## 2020-03-31 VITALS — BP 130/70 | HR 91 | Temp 98.4°F | Ht 63.4 in | Wt 165.2 lb

## 2020-03-31 DIAGNOSIS — I10 Essential (primary) hypertension: Secondary | ICD-10-CM | POA: Diagnosis not present

## 2020-03-31 DIAGNOSIS — E782 Mixed hyperlipidemia: Secondary | ICD-10-CM | POA: Diagnosis not present

## 2020-03-31 DIAGNOSIS — Z1159 Encounter for screening for other viral diseases: Secondary | ICD-10-CM

## 2020-03-31 DIAGNOSIS — R7303 Prediabetes: Secondary | ICD-10-CM

## 2020-03-31 DIAGNOSIS — J441 Chronic obstructive pulmonary disease with (acute) exacerbation: Secondary | ICD-10-CM | POA: Diagnosis not present

## 2020-03-31 DIAGNOSIS — E2839 Other primary ovarian failure: Secondary | ICD-10-CM | POA: Diagnosis not present

## 2020-03-31 DIAGNOSIS — G3184 Mild cognitive impairment, so stated: Secondary | ICD-10-CM

## 2020-03-31 LAB — POCT UA - MICROALBUMIN
Albumin/Creatinine Ratio, Urine, POC: 30
Creatinine, POC: 200 mg/dL
Microalbumin Ur, POC: 10 mg/L

## 2020-03-31 NOTE — Progress Notes (Signed)
Rutherford Nail as a scribe for [name], FNP.,have documented all relevant documentation on the behalf of [name], FNP,as directed by  [name], FNP while in the presence of [name], [place].  This visit occurred during the SARS-CoV-2 public health emergency.  Safety protocols were in place, including screening questions prior to the visit, additional usage of staff PPE, and extensive cleaning of exam room while observing appropriate contact time as indicated for disinfecting solutions.  Subjective:     Patient ID: [name] , female    DOB: December 05, 1940 , 79 y.o.   MRN: [id number]   Chief Complaint  Patient presents with  . Hyperlipidemia    HPI  Patient here today for cholesterol follow up.  Hyperlipidemia This is a chronic problem. The current episode started more than 1 year ago. The problem is controlled. Recent lipid tests were reviewed and are variable. There are no known factors aggravating her hyperlipidemia. Associated symptoms include shortness of breath (history of lung cancer and COPD). Current antihyperlipidemic treatment includes ezetimibe.  Diabetes She presents for her follow-up diabetic visit. Diabetes type: pre diabetes. Her disease course has been stable. Pertinent negatives for hypoglycemia include no dizziness or headaches. Pertinent negatives for diabetes include no fatigue, no polydipsia, no polyphagia and no polyuria.     Past Medical History:  Diagnosis Date  . Arthritis   . Asthma   . COPD (chronic obstructive pulmonary disease) ([place])   . Depression   . GERD (gastroesophageal reflux disease)   . Hyperlipidemia   . Hypertension 06/15/2016  . left lung ca dx'd 2016  . Thyroid nodule      Family History  Problem Relation Age of Onset  . Renal cancer Brother   . Diabetes Mother   . Hypertension Mother   . Renal Disease Mother   . Healthy Father      Current Outpatient Medications:  .  albuterol (VENTOLIN HFA) 108 (90  Base) MCG/ACT inhaler, INHALE 2 PUFFS INTO THE LUNGS EVERY 6 HOURS AS NEEDED FOR WHEEZING OR SHORTNESS OF BREATH., Disp: 6.7 g, Rfl: 2 .  aspirin 81 MG tablet, Take 81 mg by mouth every evening. , Disp: , Rfl:  .  Calcium Carbonate-Vitamin D 600-400 MG-UNIT tablet, Take 1 tablet by mouth daily. (Patient taking differently: Take 1 tablet by mouth daily. 600 mg of calcium and 500 units of vitd), Disp:  , Rfl:  .  cyclobenzaprine (FLEXERIL) 5 MG tablet, Take 1 tablet (5 mg total) by mouth 3 (three) times daily as needed for muscle spasms., Disp: 20 tablet, Rfl: 0 .  cycloSPORINE (RESTASIS) 0.05 % ophthalmic emulsion, 1 drop 2 (two) times daily., Disp: , Rfl:  .  donepezil (ARICEPT) 10 MG tablet, TAKE 1 TABLET BY MOUTH EVERY DAY IN THE EVENING, Disp: 90 tablet, Rfl: 1 .  ezetimibe (ZETIA) 10 MG tablet, TAKE 1 TABLET BY MOUTH EVERYDAY AT BEDTIME, Disp: 90 tablet, Rfl: 1 .  Fluticasone-Umeclidin-Vilant (TRELEGY ELLIPTA) 100-62.5-25 MCG/INH AEPB, Inhale 1 puff into the lungs daily., Disp: 60 each, Rfl: 5 .  KLOR-CON M20 20 MEQ tablet, TAKE 1 TABLET BY MOUTH EVERY DAY WITH FOOD, Disp: 90 tablet, Rfl: 1 .  Melatonin 2.5 MG CAPS, Take 2.5 mg by mouth at bedtime as needed (sleep). , Disp: , Rfl:  .  mirtazapine (REMERON) 15 MG tablet, TAKE 1 TABLET BY MOUTH EVERYDAY AT BEDTIME, Disp: 90 tablet, Rfl: 1 .  Multiple Vitamin (MULTIVITAMIN) tablet, Take 1 tablet by mouth daily., Disp: ,  Rfl:  .  niacin (NIASPAN) 500 MG CR tablet, TAKE 1 TABLET BY MOUTH EVERY DAY IN THE EVENING, Disp: 120 tablet, Rfl: 2 .  OVER THE COUNTER MEDICATION, Take 1 tablet by mouth daily., Disp: , Rfl:  .  pantoprazole (PROTONIX) 40 MG tablet, TAKE 1 TABLET BY MOUTH TWICE A DAY, Disp: 180 tablet, Rfl: 1 .  Probiotic Product (PROBIOTIC PO), Take by mouth., Disp: , Rfl:  .  valACYclovir (VALTREX) 500 MG tablet, TAKE 1 TABLET BY MOUTH EVERY DAY, Disp: 90 tablet, Rfl: 0 .  White Petrolatum-Mineral Oil (SYSTANE NIGHTTIME OP), Place 1 drop into  both eyes at bedtime as needed (for dry eyes). , Disp: , Rfl:  .  albuterol (PROVENTIL) (2.5 MG/3ML) 0.083% nebulizer solution, Take 3 mLs (2.5 mg total) by nebulization every 6 (six) hours as needed for wheezing or shortness of breath. (Patient not taking: Reported on 03/31/2020), Disp: 75 mL, Rfl: 12   No Known Allergies   Review of Systems  Constitutional: Negative.  Negative for fatigue.  HENT: Negative.   Eyes: Negative.   Respiratory: Positive for shortness of breath (history of lung cancer and COPD).   Cardiovascular: Negative.   Gastrointestinal: Negative.   Endocrine: Negative for polydipsia, polyphagia and polyuria.  Musculoskeletal: Negative.   Skin: Negative.        Cyst around right elbow area not painful   Neurological: Negative for dizziness and headaches.  Psychiatric/Behavioral: Negative.      Today's Vitals   03/31/20 1127  BP: (!) 130/70  Pulse: 91  Temp: 98.4 F (36.9 C)  TempSrc: Oral  Weight: 165 lb 3.2 oz (74.9 kg)  Height: 5' 3.4" (1.61 m)  PainSc: 0-No pain   Body mass index is 28.9 kg/m.   Objective:  Physical Exam Vitals reviewed.  Constitutional:      General: She is not in acute distress.    Appearance: Normal appearance. She is obese.  Cardiovascular:     Rate and Rhythm: Normal rate and regular rhythm.     Pulses: Normal pulses.     Heart sounds: Normal heart sounds. No murmur heard.   Pulmonary:     Effort: Pulmonary effort is normal. No respiratory distress.     Comments: Clear but diminished throughout Neurological:     General: No focal deficit present.     Mental Status: She is alert and oriented to person, place, and time.     Cranial Nerves: No cranial nerve deficit.  Psychiatric:        Mood and Affect: Mood normal.        Behavior: Behavior normal.        Thought Content: Thought content normal.        Judgment: Judgment normal.         Assessment And Plan:     1. Mixed hyperlipidemia  Chronic,  controlled  Continue with current medications - Lipid panel - CMP14+EGFR  2. Prediabetes  Chronic, controlled  No current medications  Encouraged to limit intake of sugary foods and drinks - Hemoglobin A1c - POCT UA - Microalbumin  3. Essential hypertension B/P is controlled.  CMP ordered to check renal function.  The importance of regular exercise and dietary modification was stressed to the patient.   4. Decreased estrogen level  Discussed importance of bone density to evaluate for osteoporosis she has been on chronic prednisone for her COPD and has decreased estrogen.   - DG Bone Density; Future  5. Encounter for hepatitis C  screening test for low risk patient  Will check Hepatitis C screening due to recent recommendations to screen all adults 18 years and older - Hepatitis C antibody  6. Chronic obstructive pulmonary disease with acute exacerbation (HCC)  Chronic, continue with follow up with pulmonology  She is also on Trilogy which could affect her blood sugars  7. Mild cognitive impairment  Chronic, continue with current medications  Patient was given opportunity to ask questions. Patient verbalized understanding of the plan and was able to repeat key elements of the plan. All questions were answered to their satisfaction.  [name], FNP   I, [name], FNP, have reviewed all documentation for this visit. The documentation on 04/06/20 for the exam, diagnosis, procedures, and orders are all accurate and complete.  THE PATIENT IS ENCOURAGED TO PRACTICE SOCIAL DISTANCING DUE TO THE COVID-19 PANDEMIC.

## 2020-04-01 LAB — CMP14+EGFR
ALT: 27 IU/L (ref 0–32)
AST: 34 IU/L (ref 0–40)
Albumin/Globulin Ratio: 1.7 (ref 1.2–2.2)
Albumin: 4.7 g/dL (ref 3.7–4.7)
Alkaline Phosphatase: 87 IU/L (ref 48–121)
BUN/Creatinine Ratio: 10 — ABNORMAL LOW (ref 12–28)
BUN: 12 mg/dL (ref 8–27)
Bilirubin Total: 0.8 mg/dL (ref 0.0–1.2)
CO2: 26 mmol/L (ref 20–29)
Calcium: 10 mg/dL (ref 8.7–10.3)
Chloride: 104 mmol/L (ref 96–106)
Creatinine, Ser: 1.19 mg/dL — ABNORMAL HIGH (ref 0.57–1.00)
GFR calc Af Amer: 51 mL/min/{1.73_m2} — ABNORMAL LOW (ref >59)
GFR calc non Af Amer: 44 mL/min/{1.73_m2} — ABNORMAL LOW (ref >59)
Globulin, Total: 2.8 g/dL (ref 1.5–4.5)
Glucose: 106 mg/dL — ABNORMAL HIGH (ref 65–99)
Potassium: 4.3 mmol/L (ref 3.5–5.2)
Sodium: 145 mmol/L — ABNORMAL HIGH (ref 134–144)
Total Protein: 7.5 g/dL (ref 6.0–8.5)

## 2020-04-01 LAB — HEMOGLOBIN A1C
Est. average glucose Bld gHb Est-mCnc: 143 mg/dL
Hgb A1c MFr Bld: 6.6 % — ABNORMAL HIGH (ref 4.8–5.6)

## 2020-04-01 LAB — HEPATITIS C ANTIBODY: Hep C Virus Ab: <0.1 s/co ratio (ref 0.0–0.9)

## 2020-04-04 ENCOUNTER — Other Ambulatory Visit: Payer: Self-pay | Admitting: Nurse Practitioner

## 2020-04-04 LAB — SPECIMEN STATUS REPORT

## 2020-04-04 LAB — LIPID PANEL
Chol/HDL Ratio: 4.5 ratio — ABNORMAL HIGH (ref 0.0–4.4)
Cholesterol, Total: 208 mg/dL — ABNORMAL HIGH (ref 100–199)
HDL: 46 mg/dL (ref >39)
LDL Chol Calc (NIH): 128 mg/dL — ABNORMAL HIGH (ref 0–99)
Triglycerides: 194 mg/dL — ABNORMAL HIGH (ref 0–149)
VLDL Cholesterol Cal: 34 mg/dL (ref 5–40)

## 2020-04-16 DIAGNOSIS — J449 Chronic obstructive pulmonary disease, unspecified: Secondary | ICD-10-CM | POA: Diagnosis not present

## 2020-04-16 DIAGNOSIS — J309 Allergic rhinitis, unspecified: Secondary | ICD-10-CM | POA: Diagnosis not present

## 2020-04-16 DIAGNOSIS — R0600 Dyspnea, unspecified: Secondary | ICD-10-CM | POA: Diagnosis not present

## 2020-04-17 ENCOUNTER — Other Ambulatory Visit: Payer: Self-pay | Admitting: Adult Health

## 2020-04-20 ENCOUNTER — Other Ambulatory Visit: Payer: Self-pay | Admitting: Nurse Practitioner

## 2020-04-21 ENCOUNTER — Other Ambulatory Visit: Payer: Self-pay | Admitting: Nurse Practitioner

## 2020-04-21 DIAGNOSIS — G4700 Insomnia, unspecified: Secondary | ICD-10-CM

## 2020-05-09 ENCOUNTER — Other Ambulatory Visit: Payer: Self-pay | Admitting: Nurse Practitioner

## 2020-05-17 DIAGNOSIS — R0600 Dyspnea, unspecified: Secondary | ICD-10-CM | POA: Diagnosis not present

## 2020-05-17 DIAGNOSIS — J449 Chronic obstructive pulmonary disease, unspecified: Secondary | ICD-10-CM | POA: Diagnosis not present

## 2020-05-17 DIAGNOSIS — J309 Allergic rhinitis, unspecified: Secondary | ICD-10-CM | POA: Diagnosis not present

## 2020-05-26 ENCOUNTER — Inpatient Hospital Stay: Payer: Medicare HMO | Attending: Internal Medicine

## 2020-05-26 ENCOUNTER — Other Ambulatory Visit: Payer: Self-pay

## 2020-05-26 ENCOUNTER — Ambulatory Visit (HOSPITAL_COMMUNITY)
Admission: RE | Admit: 2020-05-26 | Discharge: 2020-05-26 | Disposition: A | Discharge status: Home / Self Care | Payer: Medicare HMO | Source: Ambulatory Visit | Attending: Internal Medicine | Admitting: Internal Medicine

## 2020-05-26 DIAGNOSIS — I700 Atherosclerosis of aorta: Secondary | ICD-10-CM | POA: Diagnosis not present

## 2020-05-26 DIAGNOSIS — Z85118 Personal history of other malignant neoplasm of bronchus and lung: Secondary | ICD-10-CM | POA: Insufficient documentation

## 2020-05-26 DIAGNOSIS — C3490 Malignant neoplasm of unspecified part of unspecified bronchus or lung: Secondary | ICD-10-CM | POA: Insufficient documentation

## 2020-05-26 DIAGNOSIS — Z902 Acquired absence of lung [part of]: Secondary | ICD-10-CM | POA: Diagnosis not present

## 2020-05-26 DIAGNOSIS — J984 Other disorders of lung: Secondary | ICD-10-CM | POA: Diagnosis not present

## 2020-05-26 DIAGNOSIS — I2510 Atherosclerotic heart disease of native coronary artery without angina pectoris: Secondary | ICD-10-CM | POA: Diagnosis not present

## 2020-05-26 DIAGNOSIS — J9811 Atelectasis: Secondary | ICD-10-CM | POA: Diagnosis not present

## 2020-05-26 LAB — CMP (CANCER CENTER ONLY)
ALT: 30 U/L (ref 0–44)
AST: 35 U/L (ref 15–41)
Albumin: 4 g/dL (ref 3.5–5.0)
Alkaline Phosphatase: 85 U/L (ref 38–126)
Anion gap: 8 (ref 5–15)
BUN: 14 mg/dL (ref 8–23)
CO2: 30 mmol/L (ref 22–32)
Calcium: 10 mg/dL (ref 8.9–10.3)
Chloride: 101 mmol/L (ref 98–111)
Creatinine: 1.33 mg/dL — ABNORMAL HIGH (ref 0.44–1.00)
GFR, Est AFR Am: 44 mL/min — ABNORMAL LOW (ref >60)
GFR, Estimated: 38 mL/min — ABNORMAL LOW (ref >60)
Glucose, Bld: 167 mg/dL — ABNORMAL HIGH (ref 70–99)
Potassium: 3.9 mmol/L (ref 3.5–5.1)
Sodium: 139 mmol/L (ref 135–145)
Total Bilirubin: 0.9 mg/dL (ref 0.3–1.2)
Total Protein: 7.7 g/dL (ref 6.5–8.1)

## 2020-05-26 LAB — CBC WITH DIFFERENTIAL (CANCER CENTER ONLY)
Abs Immature Granulocytes: 0.01 10*3/uL (ref 0.00–0.07)
Basophils Absolute: 0 10*3/uL (ref 0.0–0.1)
Basophils Relative: 0 %
Eosinophils Absolute: 0.1 10*3/uL (ref 0.0–0.5)
Eosinophils Relative: 2 %
HCT: 45.6 % (ref 36.0–46.0)
Hemoglobin: 15.3 g/dL — ABNORMAL HIGH (ref 12.0–15.0)
Immature Granulocytes: 0 %
Lymphocytes Relative: 39 %
Lymphs Abs: 2.7 10*3/uL (ref 0.7–4.0)
MCH: 32.5 pg (ref 26.0–34.0)
MCHC: 33.6 g/dL (ref 30.0–36.0)
MCV: 96.8 fL (ref 80.0–100.0)
Monocytes Absolute: 0.7 10*3/uL (ref 0.1–1.0)
Monocytes Relative: 10 %
Neutro Abs: 3.4 10*3/uL (ref 1.7–7.7)
Neutrophils Relative %: 49 %
Platelet Count: 202 10*3/uL (ref 150–400)
RBC: 4.71 MIL/uL (ref 3.87–5.11)
RDW: 13.7 % (ref 11.5–15.5)
WBC Count: 6.9 10*3/uL (ref 4.0–10.5)
nRBC: 0 % (ref 0.0–0.2)

## 2020-05-26 MED ORDER — IOHEXOL 300 MG/ML  SOLN
100.0000 mL | Freq: Once | INTRAMUSCULAR | Status: AC | PRN
  Administered 2020-05-26: 100 mL via INTRAVENOUS

## 2020-05-28 ENCOUNTER — Encounter: Payer: Self-pay | Admitting: Internal Medicine

## 2020-05-28 ENCOUNTER — Other Ambulatory Visit: Payer: Self-pay

## 2020-05-28 ENCOUNTER — Inpatient Hospital Stay (HOSPITAL_BASED_OUTPATIENT_CLINIC_OR_DEPARTMENT_OTHER): Payer: Medicare HMO | Admitting: Internal Medicine

## 2020-05-28 VITALS — BP 139/84 | HR 77 | Temp 97.4°F | Resp 18 | Ht 63.4 in | Wt 164.4 lb

## 2020-05-28 DIAGNOSIS — C3492 Malignant neoplasm of unspecified part of left bronchus or lung: Secondary | ICD-10-CM | POA: Diagnosis not present

## 2020-05-28 DIAGNOSIS — Z85118 Personal history of other malignant neoplasm of bronchus and lung: Secondary | ICD-10-CM | POA: Diagnosis not present

## 2020-05-28 DIAGNOSIS — C3412 Malignant neoplasm of upper lobe, left bronchus or lung: Secondary | ICD-10-CM

## 2020-05-28 DIAGNOSIS — C3490 Malignant neoplasm of unspecified part of unspecified bronchus or lung: Secondary | ICD-10-CM

## 2020-05-28 DIAGNOSIS — Z902 Acquired absence of lung [part of]: Secondary | ICD-10-CM | POA: Diagnosis not present

## 2020-05-28 NOTE — Progress Notes (Signed)
[facility] [phone]   [phone]  OFFICE PROGRESS NOTE  [name], [address] [place] [address]  DIAGNOSIS: Stage IB (T2a, N0, M0) non-small cell lung cancer, adenocarcinoma presented with left upper lobe nodule diagnosed in September 2016.  PRIOR THERAPY: Status post left VATS with left upper lobectomy and mediastinal lymph node dissection on 07/04/2015 under the care of Dr. Roxan Hockey.  CURRENT THERAPY: Observation.  INTERVAL HISTORY: [name] 79 y.o. female returns to the clinic today for follow-up visit.  The patient is feeling fine today with no concerning complaints except for persistent soreness on the left side of the chest after her surgical resection in 2016.  She denied having any shortness of breath except with exertion with no cough or hemoptysis.  She denied having any fever or chills.  She has no nausea, vomiting, diarrhea or constipation.  She denied having any headache or visual changes.  She has no weight loss or night sweats.  The patient had repeat CT scan of the chest performed recently and she is here for evaluation and discussion of her scan results.  MEDICAL HISTORY: Past Medical History:  Diagnosis Date  . Arthritis   . Asthma   . COPD (chronic obstructive pulmonary disease) ([place])   . Depression   . GERD (gastroesophageal reflux disease)   . Hyperlipidemia   . Hypertension 06/15/2016  . left lung ca dx'd 2016  . Thyroid nodule     ALLERGIES:  has No Known Allergies.  MEDICATIONS:  Current Outpatient Medications  Medication Sig Dispense Refill  . albuterol (PROVENTIL) (2.5 MG/3ML) 0.083% nebulizer solution Take 3 mLs (2.5 mg total) by nebulization every 6 (six) hours as needed for wheezing or shortness of breath. (Patient not taking: Reported on 03/31/2020) 75 mL 12  . albuterol (VENTOLIN HFA) 108 (90 Base) MCG/ACT inhaler INHALE 2 PUFFS INTO THE LUNGS EVERY 6 HOURS AS NEEDED  FOR WHEEZING OR SHORTNESS OF BREATH 6.7 g 2  . aspirin 81 MG tablet Take 81 mg by mouth every evening.     . Calcium Carbonate-Vitamin D 600-400 MG-UNIT tablet Take 1 tablet by mouth daily. (Patient taking differently: Take 1 tablet by mouth daily. 600 mg of calcium and 500 units of vitd)    . cyclobenzaprine (FLEXERIL) 5 MG tablet Take 1 tablet (5 mg total) by mouth 3 (three) times daily as needed for muscle spasms. 20 tablet 0  . cycloSPORINE (RESTASIS) 0.05 % ophthalmic emulsion 1 drop 2 (two) times daily.    Marland Kitchen donepezil (ARICEPT) 10 MG tablet TAKE 1 TABLET BY MOUTH EVERY DAY IN THE EVENING 90 tablet 1  . ezetimibe (ZETIA) 10 MG tablet TAKE 1 TABLET BY MOUTH EVERYDAY AT BEDTIME 90 tablet 1  . Fluticasone-Umeclidin-Vilant (TRELEGY ELLIPTA) 100-62.5-25 MCG/INH AEPB Inhale 1 puff into the lungs daily. 60 each 5  . KLOR-CON M20 20 MEQ tablet TAKE 1 TABLET BY MOUTH EVERY DAY WITH FOOD 90 tablet 1  . Melatonin 2.5 MG CAPS Take 2.5 mg by mouth at bedtime as needed (sleep).     . mirtazapine (REMERON) 15 MG tablet TAKE 1 TABLET BY MOUTH EVERYDAY AT BEDTIME 90 tablet 1  . Multiple Vitamin (MULTIVITAMIN) tablet Take 1 tablet by mouth daily.    . niacin (NIASPAN) 500 MG CR tablet TAKE 1 TABLET BY MOUTH EVERY DAY IN THE EVENING 120 tablet 2  . OVER THE COUNTER MEDICATION Take 1 tablet by mouth daily.    Marland Kitchen  pantoprazole (PROTONIX) 40 MG tablet TAKE 1 TABLET BY MOUTH TWICE A DAY 180 tablet 1  . Probiotic Product (PROBIOTIC PO) Take by mouth.    . valACYclovir (VALTREX) 500 MG tablet TAKE 1 TABLET BY MOUTH EVERY DAY 90 tablet 0  . White Petrolatum-Mineral Oil (SYSTANE NIGHTTIME OP) Place 1 drop into both eyes at bedtime as needed (for dry eyes).      No current facility-administered medications for this visit.    SURGICAL HISTORY:  Past Surgical History:  Procedure Laterality Date  . BIOPSY THYROID    . CATARACT EXTRACTION     left eye  . CATARACT EXTRACTION W/ INTRAOCULAR LENS IMPLANT     RIGHT  .  CESAREAN SECTION    . EYE SURGERY    . MASS EXCISION N/A 12/13/2014   Procedure: EXCISION OF VOCAL CORD POLYPS;  Surgeon: Jerrell Belfast, MD;  Location: [facility];  Service: ENT;  Laterality: N/A;  . MICROLARYNGOSCOPY N/A 12/13/2014   Procedure: MICROLARYNGOSCOPY;  Surgeon: Jerrell Belfast, MD;  Location: [facility];  Service: ENT;  Laterality: N/A;  . TUBAL LIGATION    . VIDEO ASSISTED THORACOSCOPY (VATS)/ LOBECTOMY Left 07/04/2015   Procedure: VIDEO ASSISTED THORACOSCOPY (VATS) LEFT UPPER LOBECTOMY;  Surgeon: Melrose Nakayama, MD;  Location: [facility];  Service: Thoracic;  Laterality: Left;  Marland Kitchen VIDEO BRONCHOSCOPY WITH INSERTION OF INTERBRONCHIAL VALVE (IBV) N/A 07/11/2015   Procedure: VIDEO BRONCHOSCOPY WITH INSERTION OF INTERBRONCHIAL VALVE (IBV);  Surgeon: Melrose Nakayama, MD;  Location: [facility];  Service: Thoracic;  Laterality: N/A;  . VIDEO BRONCHOSCOPY WITH INSERTION OF INTERBRONCHIAL VALVE (IBV) N/A 09/10/2015   Procedure: VIDEO BRONCHOSCOPY WITH REMOVAL OF INTERBRONCHIAL VALVES;  Surgeon: Melrose Nakayama, MD;  Location: [facility];  Service: Thoracic;  Laterality: N/A;    REVIEW OF SYSTEMS:  A comprehensive review of systems was negative except for: Respiratory: positive for dyspnea on exertion and pleurisy/chest pain   PHYSICAL EXAMINATION: General appearance: alert, cooperative and no distress Head: Normocephalic, without obvious abnormality, atraumatic Neck: no adenopathy, no JVD, supple, symmetrical, trachea midline and thyroid not enlarged, symmetric, no tenderness/mass/nodules Lymph nodes: Cervical, supraclavicular, and axillary nodes normal. Resp: clear to auscultation bilaterally Back: symmetric, no curvature. ROM normal. No CVA tenderness. Cardio: regular rate and rhythm, S1, S2 normal, no murmur, click, rub or gallop GI: soft, non-tender; bowel sounds normal; no masses,  no organomegaly Extremities: extremities normal, atraumatic, no cyanosis or edema  ECOG PERFORMANCE STATUS: 1 -  Symptomatic but completely ambulatory  Blood pressure 139/84, pulse 77, temperature (!) 97.4 F (36.3 C), temperature source Tympanic, resp. rate 18, height 5' 3.4" (1.61 m), weight 164 lb 6.4 oz (74.6 kg).  LABORATORY DATA: Lab Results  Component Value Date   WBC 6.9 05/26/2020   HGB 15.3 (H) 05/26/2020   HCT 45.6 05/26/2020   MCV 96.8 05/26/2020   PLT 202 05/26/2020      Chemistry      Component Value Date/Time   NA 139 05/26/2020 0947   NA 145 (H) 03/31/2020 1253   NA 142 06/20/2017 1502   K 3.9 05/26/2020 0947   K 4.0 06/20/2017 1502   CL 101 05/26/2020 0947   CO2 30 05/26/2020 0947   CO2 29 06/20/2017 1502   BUN 14 05/26/2020 0947   BUN 12 03/31/2020 1253   BUN 16.0 06/20/2017 1502   CREATININE 1.33 (H) 05/26/2020 0947   CREATININE 1.2 (H) 06/20/2017 1502   GLU 108 12/08/2017 0000      Component Value  Date/Time   CALCIUM 10.0 05/26/2020 0947   CALCIUM 9.8 06/20/2017 1502   ALKPHOS 85 05/26/2020 0947   ALKPHOS 74 06/20/2017 1502   AST 35 05/26/2020 0947   AST 35 (H) 06/20/2017 1502   ALT 30 05/26/2020 0947   ALT 30 06/20/2017 1502   BILITOT 0.9 05/26/2020 0947   BILITOT 0.94 06/20/2017 1502       RADIOGRAPHIC STUDIES: CT Chest W Contrast  Result Date: 05/26/2020 CLINICAL DATA:  Non-small cell lung cancer staging EXAM: CT CHEST WITH CONTRAST TECHNIQUE: Multidetector CT imaging of the chest was performed during intravenous contrast administration. CONTRAST:  120mL OMNIPAQUE IOHEXOL 300 MG/ML  SOLN COMPARISON:  05/25/2019 FINDINGS: Cardiovascular: Aortic atherosclerosis. Normal heart size. Scattered coronary artery calcifications. No pericardial effusion. Mediastinum/Nodes: No enlarged mediastinal, hilar, or axillary lymph nodes. Enlarged, heterogeneous thyroid. Stability for greater than 5 years implies benignity; no biopsy or followup indicated (ref: J Am Coll Radiol. 2015 Feb;12(2): 143-50). Trachea, and esophagus demonstrate no significant findings.  Lungs/Pleura: Redemonstrated postoperative findings status post left upper lobectomy. Mild paraseptal and centrilobular emphysema. Scarring at the bilateral lung bases. Stable, benign focus of round atelectasis of the anterior left lower lobe (series 7, image 96). No pleural effusion or pneumothorax. Upper Abdomen: No acute abnormality. Musculoskeletal: No chest wall mass or suspicious bone lesions identified. IMPRESSION: 1. Redemonstrated postoperative findings status post left upper lobectomy. No evidence of recurrent or metastatic disease in the chest. 2. Stable, definitively benign scarring and rounded atelectasis in the lung bases. 3. Emphysema (ICD10-J43.9). 4. Coronary artery disease. Aortic Atherosclerosis (ICD10-I70.0). Electronically Signed   By: Eddie Candle M.D.   On: 05/26/2020 13:14    ASSESSMENT AND PLAN:  This is a very pleasant 79 years old African-American female with a stage IB non-small cell lung cancer status post right upper lobectomy with lymph node dissection in September 2016. She is currently on observation and she is feeling fine with no concerning complaints. She had repeat CT scan of the chest performed recently.  I personally and independently reviewed the scans and discussed the results with the patient today. Her scan showed no concerning findings for disease recurrence or metastasis. I recommended for her to continue on observation with repeat CT scan of the chest in 1 year. The patient was advised to call immediately if she has any concerning symptoms in the interval. The patient voices understanding of current disease status and treatment options and is in agreement with the current care plan. All questions were answered. The patient knows to call the clinic with any problems, questions or concerns. We can certainly see the patient much sooner if necessary.   Disclaimer: This note was dictated with voice recognition software. Similar sounding words can inadvertently  be transcribed and may not be corrected upon review.

## 2020-06-16 DIAGNOSIS — R0600 Dyspnea, unspecified: Secondary | ICD-10-CM | POA: Diagnosis not present

## 2020-06-16 DIAGNOSIS — J449 Chronic obstructive pulmonary disease, unspecified: Secondary | ICD-10-CM | POA: Diagnosis not present

## 2020-06-16 DIAGNOSIS — J309 Allergic rhinitis, unspecified: Secondary | ICD-10-CM | POA: Diagnosis not present

## 2020-06-17 ENCOUNTER — Other Ambulatory Visit: Payer: Self-pay | Admitting: Adult Health

## 2020-06-17 DIAGNOSIS — J441 Chronic obstructive pulmonary disease with (acute) exacerbation: Secondary | ICD-10-CM

## 2020-06-24 ENCOUNTER — Other Ambulatory Visit: Payer: Self-pay

## 2020-06-24 ENCOUNTER — Ambulatory Visit (INDEPENDENT_AMBULATORY_CARE_PROVIDER_SITE_OTHER): Payer: Medicare HMO | Admitting: Thoracic Surgery (Cardiothoracic Vascular Surgery)

## 2020-06-24 VITALS — BP 145/80 | HR 84 | Temp 97.9°F | Resp 20 | Ht 63.0 in | Wt 164.0 lb

## 2020-06-24 DIAGNOSIS — C3492 Malignant neoplasm of unspecified part of left bronchus or lung: Secondary | ICD-10-CM

## 2020-06-24 DIAGNOSIS — Z902 Acquired absence of lung [part of]: Secondary | ICD-10-CM | POA: Diagnosis not present

## 2020-06-24 NOTE — Progress Notes (Signed)
[facility] [address]       [place] [address]             [phone]     HPI: [name] returns for a scheduled follow-up visit  [name] is a 79 year old woman with a history of tobacco abuse, COPD, hypertension, hyperlipidemia, thyroid nodule, arthritis, depression, and a stage Ib non-small cell lung cancer.  She quit smoking in 2016 when she was diagnosed with lung cancer.  She had a thoracoscopic left upper lobectomy in October 2016.  She did not require any adjuvant therapy.  I last saw her in the office a year ago.  She continued to have occasional left-sided chest pains.  She does not take any medication for that.  Her appetite has been good.  Her weight is stable.  She has not had any change in her respiratory status.  Past Medical History:  Diagnosis Date  . Arthritis   . Asthma   . COPD (chronic obstructive pulmonary disease) ([place])   . Depression   . GERD (gastroesophageal reflux disease)   . Hyperlipidemia   . Hypertension 06/15/2016  . left lung ca dx'd 2016  . Thyroid nodule     Current Outpatient Medications  Medication Sig Dispense Refill  . albuterol (PROVENTIL) (2.5 MG/3ML) 0.083% nebulizer solution Take 3 mLs (2.5 mg total) by nebulization every 6 (six) hours as needed for wheezing or shortness of breath. 75 mL 12  . albuterol (VENTOLIN HFA) 108 (90 Base) MCG/ACT inhaler INHALE 2 PUFFS INTO THE LUNGS EVERY 6 HOURS AS NEEDED FOR WHEEZING OR SHORTNESS OF BREATH 6.7 g 2  . aspirin 81 MG tablet Take 81 mg by mouth every evening.     . Calcium Carbonate-Vitamin D 600-400 MG-UNIT tablet Take 1 tablet by mouth daily. (Patient taking differently: Take 1 tablet by mouth daily. 600 mg of calcium and 500 units of vitd)    . cyclobenzaprine (FLEXERIL) 5 MG tablet Take 1 tablet (5 mg total) by mouth 3 (three) times daily as needed for muscle spasms. 20 tablet 0  . cycloSPORINE (RESTASIS) 0.05 % ophthalmic emulsion 1 drop 2 (two) times daily.    Marland Kitchen  donepezil (ARICEPT) 10 MG tablet TAKE 1 TABLET BY MOUTH EVERY DAY IN THE EVENING 90 tablet 1  . ezetimibe (ZETIA) 10 MG tablet TAKE 1 TABLET BY MOUTH EVERYDAY AT BEDTIME 90 tablet 1  . Fluticasone-Umeclidin-Vilant (TRELEGY ELLIPTA) 100-62.5-25 MCG/INH AEPB Inhale 1 puff into the lungs daily. 60 each 5  . KLOR-CON M20 20 MEQ tablet TAKE 1 TABLET BY MOUTH EVERY DAY WITH FOOD 90 tablet 1  . magnesium oxide (MAG-OX) 400 MG tablet Take 400 mg by mouth daily.    . Melatonin 2.5 MG CAPS Take 2.5 mg by mouth at bedtime as needed (sleep).     . mirtazapine (REMERON) 15 MG tablet TAKE 1 TABLET BY MOUTH EVERYDAY AT BEDTIME 90 tablet 1  . Multiple Vitamin (MULTIVITAMIN) tablet Take 1 tablet by mouth daily.    . niacin (NIASPAN) 500 MG CR tablet TAKE 1 TABLET BY MOUTH EVERY DAY IN THE EVENING 120 tablet 2  . OVER THE COUNTER MEDICATION Take 1 tablet by mouth daily.    . pantoprazole (PROTONIX) 40 MG tablet TAKE 1 TABLET BY MOUTH TWICE A DAY 180 tablet 1  . Probiotic Product (PROBIOTIC PO) Take by mouth.    . valACYclovir (VALTREX) 500 MG tablet TAKE 1 TABLET BY MOUTH EVERY DAY 90 tablet 0  .  White Petrolatum-Mineral Oil (SYSTANE NIGHTTIME OP) Place 1 drop into both eyes at bedtime as needed (for dry eyes).      No current facility-administered medications for this visit.    Physical Exam BP (!) 145/80   Pulse 84   Temp 97.9 F (36.6 C) (Skin)   Resp 20   Ht 5\' 3"  (1.6 m)   Wt 164 lb (74.4 kg)   SpO2 90% Comment: RA  BMI 29.48 kg/m  79 year old woman in no acute distress Alert and oriented x3 with no focal deficits Lungs diminished at left base but otherwise clear Incisions well-healed No cervical or supraclavicular adenopathy Cardiac regular rate and rhythm  Diagnostic Tests: CT CHEST WITH CONTRAST  TECHNIQUE: Multidetector CT imaging of the chest was performed during intravenous contrast administration.  CONTRAST:  185mL OMNIPAQUE IOHEXOL 300 MG/ML  SOLN  COMPARISON:   05/25/2019  FINDINGS: Cardiovascular: Aortic atherosclerosis. Normal heart size. Scattered coronary artery calcifications. No pericardial effusion.  Mediastinum/Nodes: No enlarged mediastinal, hilar, or axillary lymph nodes. Enlarged, heterogeneous thyroid. Stability for greater than 5 years implies benignity; no biopsy or followup indicated (ref: J Am Coll Radiol. 2015 Feb;12(2): 143-50). Trachea, and esophagus demonstrate no significant findings.  Lungs/Pleura: Redemonstrated postoperative findings status post left upper lobectomy. Mild paraseptal and centrilobular emphysema. Scarring at the bilateral lung bases. Stable, benign focus of round atelectasis of the anterior left lower lobe (series 7, image 96). No pleural effusion or pneumothorax.  Upper Abdomen: No acute abnormality.  Musculoskeletal: No chest wall mass or suspicious bone lesions identified.  IMPRESSION: 1. Redemonstrated postoperative findings status post left upper lobectomy. No evidence of recurrent or metastatic disease in the chest. 2. Stable, definitively benign scarring and rounded atelectasis in the lung bases. 3. Emphysema (ICD10-J43.9). 4. Coronary artery disease. Aortic Atherosclerosis (ICD10-I70.0).   Electronically Signed   By: Eddie Candle M.D.   On: 05/26/2020 13:14 I personally reviewed the CT images and concur with the findings noted above  Impression: [name] is a 79 year old woman with a history of tobacco abuse, COPD, hypertension, hyperlipidemia, thyroid nodule, arthritis, depression, and non-small cell lung cancer.  She had a thoracoscopic left upper lobectomy in 2016 for stage Ib (T2, N0) non-small cell carcinoma.  She did not require adjuvant therapy.  She is been followed since that time.  Her recent CT shows no evidence of recurrent disease.  She is now 5 years out from surgery.  She will continue to be followed by Dr. Julien Nordmann with an annual CT scan.  Tobacco  abuse-quit in 2016. Plan: Follow-up with Dr. Julien Nordmann I will be happy to see [name] back at anytime if I can be of any further assistance with her care.  Melrose Nakayama, MD [facility] [phone]

## 2020-07-01 ENCOUNTER — Other Ambulatory Visit: Payer: Self-pay

## 2020-07-01 ENCOUNTER — Ambulatory Visit (INDEPENDENT_AMBULATORY_CARE_PROVIDER_SITE_OTHER): Payer: Medicare HMO | Admitting: Nurse Practitioner

## 2020-07-01 ENCOUNTER — Encounter: Payer: Self-pay | Admitting: Nurse Practitioner

## 2020-07-01 VITALS — BP 144/70 | HR 103 | Temp 98.1°F | Ht 63.0 in | Wt 165.4 lb

## 2020-07-01 DIAGNOSIS — Z1231 Encounter for screening mammogram for malignant neoplasm of breast: Secondary | ICD-10-CM

## 2020-07-01 DIAGNOSIS — R040 Epistaxis: Secondary | ICD-10-CM

## 2020-07-01 DIAGNOSIS — I2510 Atherosclerotic heart disease of native coronary artery without angina pectoris: Secondary | ICD-10-CM

## 2020-07-01 DIAGNOSIS — E042 Nontoxic multinodular goiter: Secondary | ICD-10-CM

## 2020-07-01 DIAGNOSIS — N183 Chronic kidney disease, stage 3 unspecified: Secondary | ICD-10-CM

## 2020-07-01 DIAGNOSIS — E1122 Type 2 diabetes mellitus with diabetic chronic kidney disease: Secondary | ICD-10-CM | POA: Diagnosis not present

## 2020-07-01 DIAGNOSIS — Z0000 Encounter for general adult medical examination without abnormal findings: Secondary | ICD-10-CM | POA: Diagnosis not present

## 2020-07-01 DIAGNOSIS — R7303 Prediabetes: Secondary | ICD-10-CM

## 2020-07-01 DIAGNOSIS — E559 Vitamin D deficiency, unspecified: Secondary | ICD-10-CM | POA: Diagnosis not present

## 2020-07-01 DIAGNOSIS — I10 Essential (primary) hypertension: Secondary | ICD-10-CM

## 2020-07-01 DIAGNOSIS — E782 Mixed hyperlipidemia: Secondary | ICD-10-CM | POA: Diagnosis not present

## 2020-07-01 DIAGNOSIS — Z23 Encounter for immunization: Secondary | ICD-10-CM

## 2020-07-01 DIAGNOSIS — I129 Hypertensive chronic kidney disease with stage 1 through stage 4 chronic kidney disease, or unspecified chronic kidney disease: Secondary | ICD-10-CM

## 2020-07-01 MED ORDER — FLUNISOLIDE 25 MCG/ACT (0.025%) NA SOLN: 2.0000 | Freq: Two times a day (BID) | NASAL | 0 refills | Status: DC

## 2020-07-01 NOTE — Progress Notes (Signed)
This visit occurred during the SARS-CoV-2 public health emergency.  Safety protocols were in place, including screening questions prior to the visit, additional usage of staff PPE, and extensive cleaning of exam room while observing appropriate contact time as indicated for disinfecting solutions.  Subjective:     Patient ID: [name] , female    DOB: 1941/03/23 , 79 y.o.   MRN: [id number]   Chief Complaint  Patient presents with  . Annual Exam    HPI  Patient here for HM.  Continues to go to pulmonology last seen in May   She has been having multiple nose bleeds first on fathers day then one week later, she had another one in July.  She had one this morning.  She had runny nose this morning and blew her nose lightly and noticed it was bleeding.  She is using Flonase nasal spray occasional.      Past Medical History:  Diagnosis Date  . Arthritis   . Asthma   . COPD (chronic obstructive pulmonary disease) ([place])   . Depression   . GERD (gastroesophageal reflux disease)   . Hyperlipidemia   . Hypertension 06/15/2016  . left lung ca dx'd 2016  . Thyroid nodule      Family History  Problem Relation Age of Onset  . Renal cancer Brother   . Diabetes Mother   . Hypertension Mother   . Renal Disease Mother   . Healthy Father      Current Outpatient Medications:  .  albuterol (VENTOLIN HFA) 108 (90 Base) MCG/ACT inhaler, INHALE 2 PUFFS INTO THE LUNGS EVERY 6 HOURS AS NEEDED FOR WHEEZING OR SHORTNESS OF BREATH, Disp: 6.7 g, Rfl: 2 .  aspirin 81 MG tablet, Take 81 mg by mouth every evening. , Disp: , Rfl:  .  Calcium Carbonate-Vitamin D 600-400 MG-UNIT tablet, Take 1 tablet by mouth daily. (Patient taking differently: Take 1 tablet by mouth daily. 600 mg of calcium and 500 units of vitd), Disp:  , Rfl:  .  cyclobenzaprine (FLEXERIL) 5 MG tablet, Take 1 tablet (5 mg total) by mouth 3 (three) times daily as needed for muscle spasms., Disp: 20 tablet, Rfl: 0 .  cycloSPORINE  (RESTASIS) 0.05 % ophthalmic emulsion, 1 drop 2 (two) times daily., Disp: , Rfl:  .  donepezil (ARICEPT) 10 MG tablet, TAKE 1 TABLET BY MOUTH EVERY DAY IN THE EVENING, Disp: 90 tablet, Rfl: 1 .  ezetimibe (ZETIA) 10 MG tablet, TAKE 1 TABLET BY MOUTH EVERYDAY AT BEDTIME, Disp: 90 tablet, Rfl: 1 .  Fluticasone-Umeclidin-Vilant (TRELEGY ELLIPTA) 100-62.5-25 MCG/INH AEPB, Inhale 1 puff into the lungs daily., Disp: 60 each, Rfl: 5 .  KLOR-CON M20 20 MEQ tablet, TAKE 1 TABLET BY MOUTH EVERY DAY WITH FOOD, Disp: 90 tablet, Rfl: 1 .  magnesium oxide (MAG-OX) 400 MG tablet, Take 400 mg by mouth daily., Disp: , Rfl:  .  Melatonin 2.5 MG CAPS, Take 2.5 mg by mouth at bedtime as needed (sleep). , Disp: , Rfl:  .  mirtazapine (REMERON) 15 MG tablet, TAKE 1 TABLET BY MOUTH EVERYDAY AT BEDTIME, Disp: 90 tablet, Rfl: 1 .  Multiple Vitamin (MULTIVITAMIN) tablet, Take 1 tablet by mouth daily., Disp: , Rfl:  .  niacin (NIASPAN) 500 MG CR tablet, TAKE 1 TABLET BY MOUTH EVERY DAY IN THE EVENING, Disp: 120 tablet, Rfl: 2 .  OVER THE COUNTER MEDICATION, Take 1 tablet by mouth daily., Disp: , Rfl:  .  pantoprazole (PROTONIX) 40 MG tablet, TAKE 1  TABLET BY MOUTH TWICE A DAY, Disp: 180 tablet, Rfl: 1 .  Probiotic Product (PROBIOTIC PO), Take by mouth., Disp: , Rfl:  .  valACYclovir (VALTREX) 500 MG tablet, TAKE 1 TABLET BY MOUTH EVERY DAY, Disp: 90 tablet, Rfl: 0 .  White Petrolatum-Mineral Oil (SYSTANE NIGHTTIME OP), Place 1 drop into both eyes at bedtime as needed (for dry eyes). , Disp: , Rfl:  .  albuterol (PROVENTIL) (2.5 MG/3ML) 0.083% nebulizer solution, Take 3 mLs (2.5 mg total) by nebulization every 6 (six) hours as needed for wheezing or shortness of breath. (Patient not taking: Reported on 07/01/2020), Disp: 75 mL, Rfl: 12 .  flunisolide (NASALIDE) 25 MCG/ACT (0.025%) SOLN, Place 2 sprays into the nose 2 (two) times daily., Disp: 25 mL, Rfl: 0   No Known Allergies    No LMP recorded. Patient is postmenopausal..  Negative for Dysmenorrhea and Negative for Menorrhagia. Negative for: breast discharge, breast lump(s), breast pain and breast self exam. Associated symptoms include abnormal vaginal bleeding. Pertinent negatives include abnormal bleeding (hematology), anxiety, decreased libido, depression, difficulty falling sleep, dyspareunia, history of infertility, nocturia, sexual dysfunction, sleep disturbances, urinary incontinence, urinary urgency, vaginal discharge and vaginal itching. Diet regular.The patient states her exercise level is minimal other than getting up and down going from room to room. She has shortness of breath at times.    . The patient's tobacco use is:  Social History   Tobacco Use  Smoking Status Former Smoker  . Packs/day: 1.00  . Years: 56.00  . Pack years: 56.00  . Types: Cigarettes  . Quit date: 06/22/2015  . Years since quitting: 5.0  Smokeless Tobacco Never Used   She has been exposed to passive smoke. The patient's alcohol use is:  Social History   Substance and Sexual Activity  Alcohol Use No  . Alcohol/week: 0.0 standard drinks      Review of Systems  Constitutional: Negative.   HENT: Negative.        She has been having frequent nose bleeds  Eyes: Negative.   Respiratory: Negative.  Negative for cough.   Cardiovascular: Negative.  Negative for chest pain, palpitations and leg swelling.  Gastrointestinal: Negative.   Endocrine: Negative.   Genitourinary: Negative.   Musculoskeletal: Negative.   Skin: Negative.   Allergic/Immunologic: Negative.   Neurological: Negative.  Negative for dizziness and headaches.  Hematological: Negative.   Psychiatric/Behavioral: Negative.      Today's Vitals   07/01/20 1128  BP: (!) 144/70  Pulse: (!) 103  Temp: 98.1 F (36.7 C)  Weight: 165 lb 6.4 oz (75 kg)  Height: 5\' 3"  (1.6 m)  PainSc: 0-No pain   Body mass index is 29.3 kg/m.   Objective:  Physical Exam Constitutional:      General: She is not in acute  distress.    Appearance: Normal appearance. She is well-developed. She is obese.  HENT:     Head: Normocephalic and atraumatic.     Right Ear: Hearing, tympanic membrane, ear canal and external ear normal. There is no impacted cerumen.     Left Ear: Hearing, tympanic membrane, ear canal and external ear normal. There is no impacted cerumen.     Nose:     Right Turbinates: Swollen.     Left Turbinates: Swollen.     Mouth/Throat:     Comments: Deferred - masked Eyes:     General: Lids are normal.     Extraocular Movements: Extraocular movements intact.     Conjunctiva/sclera: Conjunctivae normal.  Pupils: Pupils are equal, round, and reactive to light.     Funduscopic exam:    Right eye: No papilledema.        Left eye: No papilledema.  Neck:     Thyroid: No thyroid mass.     Vascular: No carotid bruit.  Cardiovascular:     Rate and Rhythm: Normal rate and regular rhythm.     Pulses: Normal pulses.     Heart sounds: Normal heart sounds. No murmur heard.   Pulmonary:     Effort: Pulmonary effort is normal. No respiratory distress (she has some heavy breathing which is not abnormal for her at times after walking or changing positions).     Breath sounds: Normal breath sounds. No wheezing.  Chest:     Chest wall: No mass.     Breasts: Tanner Score is 5.        Right: Normal. No mass or tenderness.        Left: Normal. No mass or tenderness.  Abdominal:     General: Abdomen is flat. Bowel sounds are normal. There is no distension.     Palpations: Abdomen is soft.     Tenderness: There is no abdominal tenderness.  Genitourinary:    Rectum: Guaiac result negative.  Musculoskeletal:        General: No swelling. Normal range of motion.     Cervical back: Full passive range of motion without pain, normal range of motion and neck supple.     Right lower leg: No edema.     Left lower leg: No edema.  Lymphadenopathy:     Upper Body:     Right upper body: No supraclavicular,  axillary or pectoral adenopathy.     Left upper body: No supraclavicular, axillary or pectoral adenopathy.  Skin:    General: Skin is warm and dry.     Capillary Refill: Capillary refill takes less than 2 seconds.  Neurological:     General: No focal deficit present.     Mental Status: She is alert and oriented to person, place, and time.     Cranial Nerves: No cranial nerve deficit.     Sensory: No sensory deficit.  Psychiatric:        Mood and Affect: Mood normal.        Behavior: Behavior normal.        Thought Content: Thought content normal.        Judgment: Judgment normal.         Assessment And Plan:     1. Encounter for health maintenance examination . Behavior modifications discussed and diet history reviewed.   . Pt will continue to exercise regularly and modify diet with low GI, plant based foods and decrease intake of processed foods.  . Recommend intake of daily multivitamin, Vitamin D, and calcium.  . Recommend mammogram for preventive screenings, as well as recommend immunizations that include influenza, TDAP (up to date) - CBC  2. Encounter for screening mammogram for malignant neoplasm of breast  Pt instructed on Self Breast Exam.According to ACOG guidelines Women aged 61 and older are recommended to get an annual mammogram. Form completed and given to patient contact the [facility] for appointment scheduing.   Pt encouraged to get annual mammogram - MM DIGITAL SCREENING BILATERAL; Future  3. Need for influenza vaccination  Influenza vaccine administered  Encouraged to take Tylenol as needed for fever or muscle aches. - Flu Vaccine QUAD High Dose(Fluad)  4. Diabetes mellitus  with stage 3 chronic kidney disease (HCC)  Chronic, stable  No current medications currently Hgba1c is 6.7 and due to age is doing well  Encouraged to continue avoiding sugary foods and drinks.  - CMP14+EGFR - Hemoglobin A1c  5. Essential hypertension . B/P is fairly  controlled.  . CMP ordered to check renal function.  . The importance of regular exercise and dietary modification was stressed to the patient.   . EKG done with SR with right atrial enlargement possible old infarct, non specific t abnormality, HR 94 - POCT Urinalysis Dipstick (81002) - EKG 12-Lead  6. Mixed hyperlipidemia  Chronic, she is taking zetia without difficulty - CMP14+EGFR - Lipid panel  7. Vitamin D deficiency disease  Will check vitamin D level and supplement as needed.     Also encouraged to spend 15 minutes in the sun daily.  - VITAMIN D 25 Hydroxy (Vit-D Deficiency, Fractures)  8. Epistaxis  She reports having a nose bleed a few times a week with her last one prior to coming to the office today  Turbinates are hypertrophic, will treat with nasal spray and refer to ENT for further evaluation - flunisolide (NASALIDE) 25 MCG/ACT (0.025%) SOLN; Place 2 sprays into the nose 2 (two) times daily.  Dispense: 25 mL; Refill: 0 - Ambulatory referral to ENT  9. Multinodular goiter (nontoxic)  Previous history of goiter and nodules on thyroid,  Will send for a thyroid ultrasound - US THYROID; Future - TSH  10. Multiple thyroid nodules - US THYROID; Future - TSH  11. Atherosclerotic cardiovascular disease  Continue with zetia and avoid fried and fatty foods     Patient was given opportunity to ask questions. Patient verbalized understanding of the plan and was able to repeat key elements of the plan. All questions were answered to their satisfaction.    Teola Bradley, FNP, have reviewed all documentation for this visit. The documentation on 07/04/20 for the exam, diagnosis, procedures, and orders are all accurate and complete.   THE PATIENT IS ENCOURAGED TO PRACTICE SOCIAL DISTANCING DUE TO THE COVID-19 PANDEMIC.

## 2020-07-02 LAB — CMP14+EGFR
ALT: 9 IU/L (ref 0–32)
AST: 14 IU/L (ref 0–40)
Albumin/Globulin Ratio: 1.3 (ref 1.2–2.2)
Albumin: 4 g/dL (ref 3.7–4.7)
Alkaline Phosphatase: 105 IU/L (ref 44–121)
BUN/Creatinine Ratio: 16 (ref 12–28)
BUN: 15 mg/dL (ref 8–27)
Bilirubin Total: 0.4 mg/dL (ref 0.0–1.2)
CO2: 23 mmol/L (ref 20–29)
Calcium: 9.4 mg/dL (ref 8.7–10.3)
Chloride: 103 mmol/L (ref 96–106)
Creatinine, Ser: 0.94 mg/dL (ref 0.57–1.00)
GFR calc Af Amer: 67 mL/min/{1.73_m2} (ref >59)
GFR calc non Af Amer: 58 mL/min/{1.73_m2} — ABNORMAL LOW (ref >59)
Globulin, Total: 3 g/dL (ref 1.5–4.5)
Glucose: 87 mg/dL (ref 65–99)
Potassium: 4.5 mmol/L (ref 3.5–5.2)
Sodium: 140 mmol/L (ref 134–144)
Total Protein: 7 g/dL (ref 6.0–8.5)

## 2020-07-02 LAB — LIPID PANEL
Chol/HDL Ratio: 4.8 ratio — ABNORMAL HIGH (ref 0.0–4.4)
Cholesterol, Total: 162 mg/dL (ref 100–199)
HDL: 34 mg/dL — ABNORMAL LOW (ref >39)
LDL Chol Calc (NIH): 110 mg/dL — ABNORMAL HIGH (ref 0–99)
Triglycerides: 97 mg/dL (ref 0–149)
VLDL Cholesterol Cal: 18 mg/dL (ref 5–40)

## 2020-07-02 LAB — CBC
Hematocrit: 45.8 % (ref 34.0–46.6)
Hemoglobin: 15.5 g/dL (ref 11.1–15.9)
MCH: 32.9 pg (ref 26.6–33.0)
MCHC: 33.8 g/dL (ref 31.5–35.7)
MCV: 97 fL (ref 79–97)
Platelets: 250 10*3/uL (ref 150–450)
RBC: 4.71 x10E6/uL (ref 3.77–5.28)
RDW: 12.9 % (ref 11.7–15.4)
WBC: 7.6 10*3/uL (ref 3.4–10.8)

## 2020-07-02 LAB — HEMOGLOBIN A1C
Est. average glucose Bld gHb Est-mCnc: 146 mg/dL
Hgb A1c MFr Bld: 6.7 % — ABNORMAL HIGH (ref 4.8–5.6)

## 2020-07-02 LAB — VITAMIN D 25 HYDROXY (VIT D DEFICIENCY, FRACTURES): Vit D, 25-Hydroxy: 32.7 ng/mL (ref 30.0–100.0)

## 2020-07-02 LAB — TSH: TSH: 1.5 u[IU]/mL (ref 0.450–4.500)

## 2020-07-03 ENCOUNTER — Ambulatory Visit: Payer: Medicare HMO | Admitting: Nurse Practitioner

## 2020-07-15 ENCOUNTER — Other Ambulatory Visit: Payer: Self-pay | Admitting: Nurse Practitioner

## 2020-07-16 ENCOUNTER — Ambulatory Visit
Admission: RE | Admit: 2020-07-16 | Discharge: 2020-07-16 | Disposition: A | Discharge status: Home / Self Care | Payer: Medicare HMO | Source: Ambulatory Visit | Attending: Nurse Practitioner | Admitting: Nurse Practitioner

## 2020-07-16 DIAGNOSIS — E041 Nontoxic single thyroid nodule: Secondary | ICD-10-CM | POA: Diagnosis not present

## 2020-07-16 DIAGNOSIS — E042 Nontoxic multinodular goiter: Secondary | ICD-10-CM

## 2020-07-17 DIAGNOSIS — J449 Chronic obstructive pulmonary disease, unspecified: Secondary | ICD-10-CM | POA: Diagnosis not present

## 2020-07-17 DIAGNOSIS — J309 Allergic rhinitis, unspecified: Secondary | ICD-10-CM | POA: Diagnosis not present

## 2020-07-17 DIAGNOSIS — R0600 Dyspnea, unspecified: Secondary | ICD-10-CM | POA: Diagnosis not present

## 2020-07-23 DIAGNOSIS — M8588 Other specified disorders of bone density and structure, other site: Secondary | ICD-10-CM | POA: Diagnosis not present

## 2020-07-23 DIAGNOSIS — Z780 Asymptomatic menopausal state: Secondary | ICD-10-CM | POA: Diagnosis not present

## 2020-07-23 DIAGNOSIS — Z1231 Encounter for screening mammogram for malignant neoplasm of breast: Secondary | ICD-10-CM | POA: Diagnosis not present

## 2020-07-23 LAB — HM MAMMOGRAPHY

## 2020-07-23 LAB — HM DEXA SCAN

## 2020-07-25 ENCOUNTER — Other Ambulatory Visit: Payer: Self-pay | Admitting: Nurse Practitioner

## 2020-07-25 DIAGNOSIS — R040 Epistaxis: Secondary | ICD-10-CM

## 2020-07-28 ENCOUNTER — Encounter: Payer: Self-pay | Admitting: Internal Medicine

## 2020-07-30 ENCOUNTER — Telehealth: Payer: Self-pay

## 2020-07-30 NOTE — Telephone Encounter (Signed)
Unable to leave vm. Trying to give dexa results. Positive for osteopenia. Is she taking vit d and calcium. Is she doing any weight bearing exercising.

## 2020-08-06 ENCOUNTER — Telehealth: Payer: Self-pay

## 2020-08-06 NOTE — Telephone Encounter (Signed)
Patient given results of bone density

## 2020-08-08 ENCOUNTER — Ambulatory Visit (INDEPENDENT_AMBULATORY_CARE_PROVIDER_SITE_OTHER): Payer: Medicare HMO | Admitting: Pulmonary Disease

## 2020-08-08 ENCOUNTER — Other Ambulatory Visit: Payer: Self-pay

## 2020-08-08 ENCOUNTER — Encounter: Payer: Self-pay | Admitting: Pulmonary Disease

## 2020-08-08 DIAGNOSIS — J441 Chronic obstructive pulmonary disease with (acute) exacerbation: Secondary | ICD-10-CM | POA: Diagnosis not present

## 2020-08-08 DIAGNOSIS — C3492 Malignant neoplasm of unspecified part of left bronchus or lung: Secondary | ICD-10-CM | POA: Diagnosis not present

## 2020-08-08 MED ORDER — PREDNISONE 10 MG PO TABS: ORAL_TABLET | ORAL | 0 refills | Status: DC

## 2020-08-08 NOTE — Assessment & Plan Note (Signed)
We will treat her as flare with short course of prednisone. Refills on Trelegy. Asked her to use albuterol more frequently if needed She has not needed nebs

## 2020-08-08 NOTE — Progress Notes (Signed)
   Subjective:    Patient ID: [name], female    DOB: 1941/06/13, 79 y.o.   MRN: [id number]  HPI  79 yo ex-smoker for follow-up of COPD and lung cancer Shesmoked about 60 pack years. She worked in a [employer] for 20 years  06/2015 underwent left VATS with left upper lobectomy and mediastinal lymph node dissection. Path showed invasive adenocarcinoma, adenocarcinoma involving the visceral pleura in addition to lymphovascular invasion. The resection margins as well as the dissected lymph nodes were negative for malignancy. She had persistent air leak postop and required endobronchial valves for resolution  Chief Complaint  Patient presents with  . Follow-up    Patient feels like breathing is worse since last visit, having more shortness of breath than normal, cough had to get up mucus.    Breathing is slightly worse.  She is compliant with Trelegy. Cough with clear white sputum Reviewed CT chest 05/2020 no evidence of recurrent disease She has gained weight since she quit smoking about 20 to 30 pounds   Significant tests/ events reviewed 05/2019 CT chest >> stable areas of scarring left upper lobe postsurgical, right lower lobe nodular scarring 12/2017 CT chest >>stable, nodular area of left lung scarring 2016Pre-op PFT - FEV1 73 %, DLCO 48%  03/2016 FEV1 66%  Spirometry2/2019shows an FEV1 at 63% ratio 57 and FVC of 86%.  Review of Systems Patient denies significant dyspnea,cough, hemoptysis,  chest pain, palpitations, pedal edema, orthopnea, paroxysmal nocturnal dyspnea, lightheadedness, nausea, vomiting, abdominal or  leg pains      Objective:   Physical Exam  Gen. Pleasant, well-nourished, in no distress ENT - no thrush, no pallor/icterus,no post nasal drip Neck: No JVD, no thyromegaly, no carotid bruits Lungs: no use of accessory muscles, no dullness to percussion,decrased BL without rales or rhonchi  Cardiovascular: Rhythm regular, heart sounds   normal, no murmurs or gallops, no peripheral edema Musculoskeletal: No deformities, no cyanosis or clubbing        Assessment & Plan:

## 2020-08-08 NOTE — Addendum Note (Signed)
Addended by: Lia Foyer R on: 08/08/2020 01:01 PM   Modules accepted: Orders

## 2020-08-08 NOTE — Patient Instructions (Addendum)
Prednisone 10 mg tabs  Take 2 tabs daily with food x 5ds, then 1 tab daily with food x 5ds then STOP  Continue trelegy

## 2020-08-08 NOTE — Assessment & Plan Note (Signed)
She has now been cancer free for 5 years Can stop surveillance CTs in my opinion

## 2020-08-10 ENCOUNTER — Other Ambulatory Visit: Payer: Self-pay | Admitting: Nurse Practitioner

## 2020-08-16 DIAGNOSIS — R0600 Dyspnea, unspecified: Secondary | ICD-10-CM | POA: Diagnosis not present

## 2020-08-16 DIAGNOSIS — J449 Chronic obstructive pulmonary disease, unspecified: Secondary | ICD-10-CM | POA: Diagnosis not present

## 2020-08-16 DIAGNOSIS — J309 Allergic rhinitis, unspecified: Secondary | ICD-10-CM | POA: Diagnosis not present

## 2020-08-18 ENCOUNTER — Other Ambulatory Visit: Payer: Self-pay | Admitting: Nurse Practitioner

## 2020-08-18 DIAGNOSIS — R040 Epistaxis: Secondary | ICD-10-CM

## 2020-08-19 ENCOUNTER — Other Ambulatory Visit: Payer: Self-pay | Admitting: Nurse Practitioner

## 2020-08-25 ENCOUNTER — Other Ambulatory Visit: Payer: Self-pay | Admitting: Nurse Practitioner

## 2020-09-12 DIAGNOSIS — R040 Epistaxis: Secondary | ICD-10-CM | POA: Diagnosis not present

## 2020-09-12 DIAGNOSIS — J3489 Other specified disorders of nose and nasal sinuses: Secondary | ICD-10-CM | POA: Insufficient documentation

## 2020-09-12 DIAGNOSIS — J310 Chronic rhinitis: Secondary | ICD-10-CM | POA: Diagnosis not present

## 2020-09-12 HISTORY — DX: Epistaxis: R04.0

## 2020-10-06 ENCOUNTER — Other Ambulatory Visit: Payer: Self-pay | Admitting: Nurse Practitioner

## 2020-10-14 ENCOUNTER — Other Ambulatory Visit: Payer: Self-pay | Admitting: Nurse Practitioner

## 2020-10-14 DIAGNOSIS — G4700 Insomnia, unspecified: Secondary | ICD-10-CM

## 2020-10-30 ENCOUNTER — Other Ambulatory Visit: Payer: Self-pay | Admitting: Internal Medicine

## 2021-01-01 ENCOUNTER — Ambulatory Visit (INDEPENDENT_AMBULATORY_CARE_PROVIDER_SITE_OTHER): Payer: Medicare HMO | Admitting: Nurse Practitioner

## 2021-01-01 ENCOUNTER — Ambulatory Visit: Payer: Medicare HMO

## 2021-01-01 ENCOUNTER — Encounter: Payer: Self-pay | Admitting: Nurse Practitioner

## 2021-01-01 ENCOUNTER — Other Ambulatory Visit: Payer: Self-pay

## 2021-01-01 VITALS — BP 124/80 | HR 83 | Temp 98.3°F | Ht 62.4 in | Wt 162.6 lb

## 2021-01-01 DIAGNOSIS — N183 Chronic kidney disease, stage 3 unspecified: Secondary | ICD-10-CM | POA: Diagnosis not present

## 2021-01-01 DIAGNOSIS — E1122 Type 2 diabetes mellitus with diabetic chronic kidney disease: Secondary | ICD-10-CM

## 2021-01-01 DIAGNOSIS — D229 Melanocytic nevi, unspecified: Secondary | ICD-10-CM

## 2021-01-01 DIAGNOSIS — E782 Mixed hyperlipidemia: Secondary | ICD-10-CM

## 2021-01-01 DIAGNOSIS — M79605 Pain in left leg: Secondary | ICD-10-CM

## 2021-01-01 DIAGNOSIS — M79604 Pain in right leg: Secondary | ICD-10-CM | POA: Diagnosis not present

## 2021-01-01 DIAGNOSIS — I10 Essential (primary) hypertension: Secondary | ICD-10-CM

## 2021-01-01 NOTE — Patient Instructions (Signed)

## 2021-01-01 NOTE — Progress Notes (Deleted)
I,Kadelyn Dimascio [employer] as a Education administrator for [employer], FNP.,have documented all relevant documentation on the behalf of [name], FNP,as directed by  [name], FNP while in the presence of [name], [place]. This visit occurred during the SARS-CoV-2 public health emergency.  Safety protocols were in place, including screening questions prior to the visit, additional usage of staff PPE, and extensive cleaning of exam room while observing appropriate contact time as indicated for disinfecting solutions.  Subjective:     Patient ID: [name] , female    DOB: 1940/12/12 , 80 y.o.   MRN: [id number]   Chief Complaint  Patient presents with  . Hypertension    HPI  Patient here today for hypertension follow up.  Hypertension Associated symptoms include shortness of breath (history of lung cancer and COPD). Pertinent negatives include no headaches.  Hyperlipidemia This is a chronic problem. The current episode started more than 1 year ago. The problem is controlled. Recent lipid tests were reviewed and are variable. There are no known factors aggravating her hyperlipidemia. Associated symptoms include shortness of breath (history of lung cancer and COPD). Current antihyperlipidemic treatment includes ezetimibe.  Diabetes She presents for her follow-up diabetic visit. Diabetes type: pre diabetes. Her disease course has been stable. Pertinent negatives for hypoglycemia include no dizziness or headaches. Pertinent negatives for diabetes include no fatigue, no polydipsia, no polyphagia and no polyuria.     Past Medical History:  Diagnosis Date  . Arthritis   . Asthma   . COPD (chronic obstructive pulmonary disease) ([place])   . Depression   . GERD (gastroesophageal reflux disease)   . Hyperlipidemia   . Hypertension 06/15/2016  . left lung ca dx'd 2016  . Thyroid nodule      Family History  Problem Relation Age of Onset  . Renal cancer Brother   . Diabetes Mother   .  Hypertension Mother   . Renal Disease Mother   . Healthy Father      Current Outpatient Medications:  .  albuterol (PROVENTIL) (2.5 MG/3ML) 0.083% nebulizer solution, Take 3 mLs (2.5 mg total) by nebulization every 6 (six) hours as needed for wheezing or shortness of breath., Disp: 75 mL, Rfl: 12 .  albuterol (VENTOLIN HFA) 108 (90 Base) MCG/ACT inhaler, INHALE 2 PUFFS INTO THE LUNGS EVERY 6 HOURS AS NEEDED FOR WHEEZING OR SHORTNESS OF BREATH, Disp: 6.7 g, Rfl: 2 .  aspirin 81 MG tablet, Take 81 mg by mouth every evening. , Disp: , Rfl:  .  Calcium Carbonate-Vitamin D 600-400 MG-UNIT tablet, Take 1 tablet by mouth daily. (Patient taking differently: Take 1 tablet by mouth daily. 600 mg of calcium and 500 units of vitd), Disp:  , Rfl:  .  cyclobenzaprine (FLEXERIL) 5 MG tablet, Take 1 tablet (5 mg total) by mouth 3 (three) times daily as needed for muscle spasms., Disp: 20 tablet, Rfl: 0 .  cycloSPORINE (RESTASIS) 0.05 % ophthalmic emulsion, 1 drop 2 (two) times daily., Disp: , Rfl:  .  donepezil (ARICEPT) 10 MG tablet, TAKE 1 TABLET BY MOUTH EVERY DAY IN THE EVENING, Disp: 90 tablet, Rfl: 1 .  ezetimibe (ZETIA) 10 MG tablet, TAKE 1 TABLET BY MOUTH EVERYDAY AT BEDTIME, Disp: 90 tablet, Rfl: 1 .  flunisolide (NASALIDE) 25 MCG/ACT (0.025%) SOLN, PLACE 2 SPRAYS INTO THE NOSE 2 (TWO) TIMES DAILY., Disp: 25 mL, Rfl: 1 .  Fluticasone-Umeclidin-Vilant (TRELEGY ELLIPTA) 100-62.5-25 MCG/INH AEPB, Inhale 1 puff into the lungs daily., Disp: 60 each, Rfl: 5 .  KLOR-CON M20 20 MEQ tablet, TAKE 1 TABLET BY MOUTH EVERY DAY WITH FOOD, Disp: 90 tablet, Rfl: 1 .  magnesium oxide (MAG-OX) 400 MG tablet, Take 400 mg by mouth daily., Disp: , Rfl:  .  Melatonin 2.5 MG CAPS, Take 2.5 mg by mouth at bedtime as needed (sleep). , Disp: , Rfl:  .  mirtazapine (REMERON) 15 MG tablet, TAKE 1 TABLET BY MOUTH EVERYDAY AT BEDTIME, Disp: 90 tablet, Rfl: 1 .  Multiple Vitamin (MULTIVITAMIN) tablet, Take 1 tablet by mouth daily.,  Disp: , Rfl:  .  niacin (NIASPAN) 500 MG CR tablet, TAKE 1 TABLET BY MOUTH EVERY DAY IN THE EVENING, Disp: 120 tablet, Rfl: 2 .  OVER THE COUNTER MEDICATION, Take 1 tablet by mouth daily., Disp: , Rfl:  .  pantoprazole (PROTONIX) 40 MG tablet, TAKE 1 TABLET BY MOUTH TWICE A DAY, Disp: 180 tablet, Rfl: 1 .  predniSONE (DELTASONE) 10 MG tablet, Take 2 tabs daily with food x 5ds, then 1 tab daily with food x 5ds then STOP, Disp: 15 tablet, Rfl: 0 .  Probiotic Product (PROBIOTIC PO), Take by mouth., Disp: , Rfl:  .  valACYclovir (VALTREX) 500 MG tablet, TAKE 1 TABLET BY MOUTH EVERY DAY, Disp: 90 tablet, Rfl: 0 .  White Petrolatum-Mineral Oil (SYSTANE NIGHTTIME OP), Place 1 drop into both eyes at bedtime as needed (for dry eyes). , Disp: , Rfl:    No Known Allergies   Review of Systems  Constitutional: Negative for fatigue.  Respiratory: Positive for shortness of breath (history of lung cancer and COPD).   Endocrine: Negative for polydipsia, polyphagia and polyuria.  Neurological: Negative for dizziness and headaches.     There were no vitals filed for this visit. There is no height or weight on file to calculate BMI.   Objective:  Physical Exam      Assessment And Plan:     1. Essential hypertension     Patient was given opportunity to ask questions. Patient verbalized understanding of the plan and was able to repeat key elements of the plan. All questions were answered to their satisfaction.  [id number] [place], CMA   I, [place], [place], have reviewed all documentation for this visit. The documentation on 01/01/21 for the exam, diagnosis, procedures, and orders are all accurate and complete.   IF YOU HAVE BEEN REFERRED TO A SPECIALIST, IT MAY TAKE 1-2 WEEKS TO SCHEDULE/PROCESS THE REFERRAL. IF YOU HAVE NOT HEARD FROM US/SPECIALIST IN TWO WEEKS, PLEASE GIVE [name] A CALL AT [phone].   THE PATIENT IS ENCOURAGED TO PRACTICE SOCIAL DISTANCING DUE TO THE COVID-19  PANDEMIC.

## 2021-01-01 NOTE — Progress Notes (Signed)
This visit occurred during the SARS-CoV-2 public health emergency.  Safety protocols were in place, including screening questions prior to the visit, additional usage of staff PPE, and extensive cleaning of exam room while observing appropriate contact time as indicated for disinfecting solutions.  Subjective:     Patient ID: [name] , female    DOB: 1941-02-10 , 80 y.o.   MRN: [id number]   Chief Complaint  Patient presents with  . Hypertension    HPI  [name] presents for a hypertension and diabetes follow up.  Her blood pressure today is 124/80.  She has soreness to both legs to touch.  No pain medications, she is unable to describe how the pain is.  She is scheduled to see the pulmonologist. No changes at the last visit.  She has not taken any metformin before and states "she does not want to take it".    Diabetes She presents for her follow-up diabetic visit. She has type 2 diabetes mellitus. Her disease course has been stable. Pertinent negatives for hypoglycemia include no dizziness or headaches. Pertinent negatives for diabetes include no fatigue, no polydipsia, no polyphagia and no polyuria. There are no hypoglycemic complications. Risk factors for coronary artery disease include sedentary lifestyle. Current diabetic treatment includes diet. She is following a generally healthy diet. When asked about meal planning, she reported none. She has not had a previous visit with a dietitian. She rarely participates in exercise. (She is not checking her blood sugars) She does not see a podiatrist.Eye exam current: scheduled to see eye doctor in June.  Hyperlipidemia This is a chronic problem. The current episode started more than 1 year ago. The problem is controlled. Recent lipid tests were reviewed and are variable. There are no known factors aggravating her hyperlipidemia. Associated symptoms include shortness of breath (history of lung cancer and COPD). Current  antihyperlipidemic treatment includes ezetimibe. There are no compliance problems.  Risk factors for coronary artery disease include diabetes mellitus, dyslipidemia and a sedentary lifestyle.     Past Medical History:  Diagnosis Date  . Arthritis   . Asthma   . COPD (chronic obstructive pulmonary disease) ([place])   . Depression   . GERD (gastroesophageal reflux disease)   . Hyperlipidemia   . Hypertension 06/15/2016  . left lung ca dx'd 2016  . Thyroid nodule      Family History  Problem Relation Age of Onset  . Renal cancer Brother   . Diabetes Mother   . Hypertension Mother   . Renal Disease Mother   . Healthy Father      Current Outpatient Medications:  .  albuterol (PROVENTIL) (2.5 MG/3ML) 0.083% nebulizer solution, Take 3 mLs (2.5 mg total) by nebulization every 6 (six) hours as needed for wheezing or shortness of breath., Disp: 75 mL, Rfl: 12 .  albuterol (VENTOLIN HFA) 108 (90 Base) MCG/ACT inhaler, INHALE 2 PUFFS INTO THE LUNGS EVERY 6 HOURS AS NEEDED FOR WHEEZING OR SHORTNESS OF BREATH, Disp: 6.7 g, Rfl: 2 .  aspirin 81 MG tablet, Take 81 mg by mouth every evening. , Disp: , Rfl:  .  Calcium Carbonate-Vitamin D 600-400 MG-UNIT tablet, Take 1 tablet by mouth daily. (Patient taking differently: Take 1 tablet by mouth daily. 600 mg of calcium and 500 units of vitd), Disp:  , Rfl:  .  cyclobenzaprine (FLEXERIL) 5 MG tablet, Take 1 tablet (5 mg total) by mouth 3 (three) times daily as needed for muscle spasms., Disp: 20 tablet, Rfl: 0 .  cycloSPORINE (RESTASIS) 0.05 % ophthalmic emulsion, 1 drop 2 (two) times daily., Disp: , Rfl:  .  donepezil (ARICEPT) 10 MG tablet, TAKE 1 TABLET BY MOUTH EVERY DAY IN THE EVENING, Disp: 90 tablet, Rfl: 1 .  ezetimibe (ZETIA) 10 MG tablet, TAKE 1 TABLET BY MOUTH EVERYDAY AT BEDTIME, Disp: 90 tablet, Rfl: 1 .  flunisolide (NASALIDE) 25 MCG/ACT (0.025%) SOLN, PLACE 2 SPRAYS INTO THE NOSE 2 (TWO) TIMES DAILY., Disp: 25 mL, Rfl: 1 .   Fluticasone-Umeclidin-Vilant (TRELEGY ELLIPTA) 100-62.5-25 MCG/INH AEPB, Inhale 1 puff into the lungs daily., Disp: 60 each, Rfl: 5 .  KLOR-CON M20 20 MEQ tablet, TAKE 1 TABLET BY MOUTH EVERY DAY WITH FOOD, Disp: 90 tablet, Rfl: 1 .  magnesium oxide (MAG-OX) 400 MG tablet, Take 400 mg by mouth daily., Disp: , Rfl:  .  Melatonin 2.5 MG CAPS, Take 2.5 mg by mouth at bedtime as needed (sleep). , Disp: , Rfl:  .  mirtazapine (REMERON) 15 MG tablet, TAKE 1 TABLET BY MOUTH EVERYDAY AT BEDTIME, Disp: 90 tablet, Rfl: 1 .  Multiple Vitamin (MULTIVITAMIN) tablet, Take 1 tablet by mouth daily., Disp: , Rfl:  .  niacin (NIASPAN) 500 MG CR tablet, TAKE 1 TABLET BY MOUTH EVERY DAY IN THE EVENING, Disp: 120 tablet, Rfl: 2 .  OVER THE COUNTER MEDICATION, Take 1 tablet by mouth daily., Disp: , Rfl:  .  pantoprazole (PROTONIX) 40 MG tablet, TAKE 1 TABLET BY MOUTH TWICE A DAY, Disp: 180 tablet, Rfl: 1 .  predniSONE (DELTASONE) 10 MG tablet, Take 2 tabs daily with food x 5ds, then 1 tab daily with food x 5ds then STOP, Disp: 15 tablet, Rfl: 0 .  Probiotic Product (PROBIOTIC PO), Take by mouth., Disp: , Rfl:  .  White Petrolatum-Mineral Oil (SYSTANE NIGHTTIME OP), Place 1 drop into both eyes at bedtime as needed (for dry eyes). , Disp: , Rfl:  .  valACYclovir (VALTREX) 500 MG tablet, TAKE 1 TABLET BY MOUTH EVERY DAY, Disp: 90 tablet, Rfl: 0   No Known Allergies   Review of Systems  Constitutional: Negative.  Negative for fatigue.  Respiratory: Positive for shortness of breath (history of lung cancer and COPD). Negative for wheezing.   Cardiovascular: Negative.   Endocrine: Negative for polydipsia, polyphagia and polyuria.  Neurological: Negative for dizziness and headaches.  Psychiatric/Behavioral: Negative.      Today's Vitals   01/01/21 1032  BP: 124/80  Pulse: 83  Temp: 98.3 F (36.8 C)  TempSrc: Oral  Weight: 162 lb 9.6 oz (73.8 kg)  Height: 5' 2.4" (1.585 m)  PainSc: 0-No pain   Body mass index  is 29.36 kg/m.   Objective:  Physical Exam Vitals reviewed.  Constitutional:      General: She is not in acute distress.    Appearance: Normal appearance.  Cardiovascular:     Pulses: Normal pulses.     Heart sounds: Normal heart sounds. No murmur heard.   Pulmonary:     Effort: Pulmonary effort is normal. No respiratory distress.     Breath sounds: No wheezing.  Skin:    General: Skin is warm and dry.     Comments: She has an irregular shaped nevi   Neurological:     General: No focal deficit present.     Mental Status: She is alert and oriented to person, place, and time.     Cranial Nerves: No cranial nerve deficit.     Motor: No weakness.  Psychiatric:  Mood and Affect: Mood normal.        Behavior: Behavior normal.        Thought Content: Thought content normal.        Judgment: Judgment normal.         Assessment And Plan:     1. Essential hypertension . B/P is controlled.  . CMP ordered to check renal function.  . The importance of regular exercise and dietary modification was stressed to the patient.   2. Diabetes mellitus with stage 3 chronic kidney disease (HCC)  Chronic, stable  Continue with current medications - CMP14+EGFR - Hemoglobin A1c  3. Mixed hyperlipidemia  Chronic, controlled  Continue with current medications - CMP14+EGFR  4. Atypical nevi  Irregular shaped nevi to left lower extremity - Ambulatory referral to Dermatology  5. Pain in both lower extremities  Will order duplex lower extremities to evaluate for PAD  Pulses present bilaterally - US Lower Ext Art Bilat; Future     Patient was given opportunity to ask questions. Patient verbalized understanding of the plan and was able to repeat key elements of the plan. All questions were answered to their satisfaction.  [name], FNP   I, [name], FNP, have reviewed all documentation for this visit. The documentation on 01/01/21 for the exam, diagnosis,  procedures, and orders are all accurate and complete.   IF YOU HAVE BEEN REFERRED TO A SPECIALIST, IT MAY TAKE 1-2 WEEKS TO SCHEDULE/PROCESS THE REFERRAL. IF YOU HAVE NOT HEARD FROM US/SPECIALIST IN TWO WEEKS, PLEASE GIVE [name] A CALL AT [phone].   THE PATIENT IS ENCOURAGED TO PRACTICE SOCIAL DISTANCING DUE TO THE COVID-19 PANDEMIC.

## 2021-01-02 LAB — CMP14+EGFR
ALT: 30 IU/L (ref 0–32)
AST: 40 IU/L (ref 0–40)
Albumin/Globulin Ratio: 1.6 (ref 1.2–2.2)
Albumin: 4.4 g/dL (ref 3.7–4.7)
Alkaline Phosphatase: 84 IU/L (ref 44–121)
BUN/Creatinine Ratio: 8 — ABNORMAL LOW (ref 12–28)
BUN: 10 mg/dL (ref 8–27)
Bilirubin Total: 1 mg/dL (ref 0.0–1.2)
CO2: 27 mmol/L (ref 20–29)
Calcium: 10.3 mg/dL (ref 8.7–10.3)
Chloride: 99 mmol/L (ref 96–106)
Creatinine, Ser: 1.19 mg/dL — ABNORMAL HIGH (ref 0.57–1.00)
Globulin, Total: 2.8 g/dL (ref 1.5–4.5)
Glucose: 112 mg/dL — ABNORMAL HIGH (ref 65–99)
Potassium: 4 mmol/L (ref 3.5–5.2)
Sodium: 140 mmol/L (ref 134–144)
Total Protein: 7.2 g/dL (ref 6.0–8.5)
eGFR: 47 mL/min/{1.73_m2} — ABNORMAL LOW (ref >59)

## 2021-01-02 LAB — HEMOGLOBIN A1C
Est. average glucose Bld gHb Est-mCnc: 154 mg/dL
Hgb A1c MFr Bld: 7 % — ABNORMAL HIGH (ref 4.8–5.6)

## 2021-01-03 ENCOUNTER — Other Ambulatory Visit: Payer: Self-pay | Admitting: Nurse Practitioner

## 2021-01-06 ENCOUNTER — Other Ambulatory Visit: Payer: Self-pay | Admitting: Nurse Practitioner

## 2021-01-06 DIAGNOSIS — M79605 Pain in left leg: Secondary | ICD-10-CM

## 2021-01-12 ENCOUNTER — Other Ambulatory Visit: Payer: Self-pay | Admitting: Nurse Practitioner

## 2021-01-12 ENCOUNTER — Ambulatory Visit
Admission: RE | Admit: 2021-01-12 | Discharge: 2021-01-12 | Disposition: A | Discharge status: Home / Self Care | Payer: Medicare HMO | Source: Ambulatory Visit | Attending: Nurse Practitioner | Admitting: Nurse Practitioner

## 2021-01-12 DIAGNOSIS — M79604 Pain in right leg: Secondary | ICD-10-CM | POA: Diagnosis not present

## 2021-01-12 DIAGNOSIS — M79661 Pain in right lower leg: Secondary | ICD-10-CM | POA: Diagnosis not present

## 2021-01-12 DIAGNOSIS — M79605 Pain in left leg: Secondary | ICD-10-CM

## 2021-01-12 DIAGNOSIS — M79662 Pain in left lower leg: Secondary | ICD-10-CM | POA: Diagnosis not present

## 2021-01-14 ENCOUNTER — Telehealth: Payer: Self-pay | Admitting: Nurse Practitioner

## 2021-01-14 NOTE — Telephone Encounter (Signed)
Left message for patient to call back and schedule Medicare Annual Wellness Visit (AWV) either virtually or in office.   Last AWV 12/27/19  please schedule at anytime with [facility]    This should be a 45 minute visit.

## 2021-01-15 ENCOUNTER — Telehealth: Payer: Self-pay

## 2021-01-15 NOTE — Telephone Encounter (Signed)
-----   Message from [name], [place] sent at 01/14/2021  5:36 PM EDT ----- Kidney functions have declined slightly be sure to stay well hydrated with water

## 2021-01-15 NOTE — Telephone Encounter (Signed)
Left a detailed message.

## 2021-01-15 NOTE — Telephone Encounter (Signed)
-----   Message from [name], [place] sent at 01/14/2021  5:35 PM EDT ----- Your leg scans are normal

## 2021-01-15 NOTE — Telephone Encounter (Signed)
Left a message

## 2021-01-21 ENCOUNTER — Other Ambulatory Visit: Payer: Self-pay

## 2021-01-21 DIAGNOSIS — E1122 Type 2 diabetes mellitus with diabetic chronic kidney disease: Secondary | ICD-10-CM

## 2021-01-21 DIAGNOSIS — N183 Chronic kidney disease, stage 3 unspecified: Secondary | ICD-10-CM

## 2021-01-26 DIAGNOSIS — L918 Other hypertrophic disorders of the skin: Secondary | ICD-10-CM | POA: Diagnosis not present

## 2021-01-26 DIAGNOSIS — L821 Other seborrheic keratosis: Secondary | ICD-10-CM | POA: Diagnosis not present

## 2021-02-02 ENCOUNTER — Other Ambulatory Visit: Payer: Self-pay | Admitting: Nurse Practitioner

## 2021-02-02 ENCOUNTER — Other Ambulatory Visit: Payer: Self-pay | Admitting: Pulmonary Disease

## 2021-02-02 DIAGNOSIS — J441 Chronic obstructive pulmonary disease with (acute) exacerbation: Secondary | ICD-10-CM

## 2021-02-05 ENCOUNTER — Other Ambulatory Visit: Payer: Self-pay

## 2021-02-05 ENCOUNTER — Ambulatory Visit (INDEPENDENT_AMBULATORY_CARE_PROVIDER_SITE_OTHER): Payer: Medicare HMO

## 2021-02-05 VITALS — BP 124/74 | HR 112 | Temp 98.3°F | Ht 62.0 in | Wt 165.0 lb

## 2021-02-05 DIAGNOSIS — Z0000 Encounter for general adult medical examination without abnormal findings: Secondary | ICD-10-CM

## 2021-02-05 NOTE — Progress Notes (Signed)
This visit occurred during the SARS-CoV-2 public health emergency.  Safety protocols were in place, including screening questions prior to the visit, additional usage of staff PPE, and extensive cleaning of exam room while observing appropriate contact time as indicated for disinfecting solutions.  Subjective:   [name] is a 80 y.o. female who presents for Medicare Annual (Subsequent) preventive examination.  Review of Systems     Cardiac Risk Factors include: advanced age (>3men, >63 women);dyslipidemia;hypertension;obesity (BMI >30kg/m2);sedentary lifestyle     Objective:    Today's Vitals   02/05/21 1448  Weight: 165 lb (74.8 kg)  Height: 5\' 2"  (1.575 m)   Body mass index is 30.18 kg/m.  Advanced Directives 02/05/2021 05/28/2020 12/27/2019 05/03/2019 02/21/2019 09/25/2018 06/14/2018  Does Patient Have a Medical Advance Directive? No No No No No No No  Type of Advance Directive - - - - - - -  Does patient want to make changes to medical advance directive? - - - - - - -  Would patient like information on creating a medical advance directive? No - Patient declined No - Patient declined No - Guardian declined - No - Patient declined No - Patient declined Yes (Inpatient - patient defers creating a medical advance directive at this time)    Current Medications (verified) Outpatient Encounter Medications as of 02/05/2021  Medication Sig  . albuterol (PROVENTIL) (2.5 MG/3ML) 0.083% nebulizer solution Take 3 mLs (2.5 mg total) by nebulization every 6 (six) hours as needed for wheezing or shortness of breath.  Marland Kitchen albuterol (VENTOLIN HFA) 108 (90 Base) MCG/ACT inhaler INHALE 2 PUFFS INTO THE LUNGS EVERY 6 HOURS AS NEEDED FOR WHEEZING OR SHORTNESS OF BREATH  . aspirin 81 MG tablet Take 81 mg by mouth every evening.   . Calcium Carbonate-Vitamin D 600-400 MG-UNIT tablet Take 1 tablet by mouth daily. (Patient taking differently: Take 1 tablet by mouth daily. 600 mg of calcium and 500 units of  vitd)  . cyclobenzaprine (FLEXERIL) 5 MG tablet Take 1 tablet (5 mg total) by mouth 3 (three) times daily as needed for muscle spasms.  . cycloSPORINE (RESTASIS) 0.05 % ophthalmic emulsion 1 drop 2 (two) times daily.  Marland Kitchen donepezil (ARICEPT) 10 MG tablet TAKE 1 TABLET BY MOUTH EVERY DAY IN THE EVENING  . ezetimibe (ZETIA) 10 MG tablet TAKE 1 TABLET BY MOUTH EVERYDAY AT BEDTIME  . KLOR-CON M20 20 MEQ tablet TAKE 1 TABLET BY MOUTH EVERY DAY WITH FOOD  . magnesium oxide (MAG-OX) 400 MG tablet Take 400 mg by mouth daily.  . Melatonin 2.5 MG CAPS Take 2.5 mg by mouth at bedtime as needed (sleep).   . mirtazapine (REMERON) 15 MG tablet TAKE 1 TABLET BY MOUTH EVERYDAY AT BEDTIME  . Multiple Vitamin (MULTIVITAMIN) tablet Take 1 tablet by mouth daily.  . niacin (NIASPAN) 500 MG CR tablet TAKE 1 TABLET BY MOUTH EVERY DAY IN THE EVENING  . OVER THE COUNTER MEDICATION Take 1 tablet by mouth daily.  . pantoprazole (PROTONIX) 40 MG tablet TAKE 1 TABLET BY MOUTH TWICE A DAY  . Probiotic Product (PROBIOTIC PO) Take by mouth.  . TRELEGY ELLIPTA 100-62.5-25 MCG/INH AEPB INHALE 1 PUFF ONE TIME DAILY  . valACYclovir (VALTREX) 500 MG tablet TAKE 1 TABLET BY MOUTH EVERY DAY  . White Petrolatum-Mineral Oil (SYSTANE NIGHTTIME OP) Place 1 drop into both eyes at bedtime as needed (for dry eyes).   . flunisolide (NASALIDE) 25 MCG/ACT (0.025%) SOLN PLACE 2 SPRAYS INTO THE NOSE 2 (TWO) TIMES DAILY. (  Patient not taking: Reported on 02/05/2021)  . predniSONE (DELTASONE) 10 MG tablet Take 2 tabs daily with food x 5ds, then 1 tab daily with food x 5ds then STOP (Patient not taking: Reported on 02/05/2021)   No facility-administered encounter medications on file as of 02/05/2021.    Allergies (verified) Patient has no known allergies.   History: Past Medical History:  Diagnosis Date  . Arthritis   . Asthma   . COPD (chronic obstructive pulmonary disease) ([place])   . Depression   . GERD (gastroesophageal reflux disease)   .  Hyperlipidemia   . Hypertension 06/15/2016  . left lung ca dx'd 2016  . Thyroid nodule    Past Surgical History:  Procedure Laterality Date  . BIOPSY THYROID    . CATARACT EXTRACTION     left eye  . CATARACT EXTRACTION W/ INTRAOCULAR LENS IMPLANT     RIGHT  . CESAREAN SECTION    . EYE SURGERY    . MASS EXCISION N/A 12/13/2014   Procedure: EXCISION OF VOCAL CORD POLYPS;  Surgeon: Jerrell Belfast, MD;  Location: [place];  Service: ENT;  Laterality: N/A;  . MICROLARYNGOSCOPY N/A 12/13/2014   Procedure: MICROLARYNGOSCOPY;  Surgeon: Jerrell Belfast, MD;  Location: [place];  Service: ENT;  Laterality: N/A;  . TUBAL LIGATION    . VIDEO ASSISTED THORACOSCOPY (VATS)/ LOBECTOMY Left 07/04/2015   Procedure: VIDEO ASSISTED THORACOSCOPY (VATS) LEFT UPPER LOBECTOMY;  Surgeon: Melrose Nakayama, MD;  Location: [place];  Service: Thoracic;  Laterality: Left;  Marland Kitchen VIDEO BRONCHOSCOPY WITH INSERTION OF INTERBRONCHIAL VALVE (IBV) N/A 07/11/2015   Procedure: VIDEO BRONCHOSCOPY WITH INSERTION OF INTERBRONCHIAL VALVE (IBV);  Surgeon: Melrose Nakayama, MD;  Location: [place];  Service: Thoracic;  Laterality: N/A;  . VIDEO BRONCHOSCOPY WITH INSERTION OF INTERBRONCHIAL VALVE (IBV) N/A 09/10/2015   Procedure: VIDEO BRONCHOSCOPY WITH REMOVAL OF INTERBRONCHIAL VALVES;  Surgeon: Melrose Nakayama, MD;  Location: [facility];  Service: Thoracic;  Laterality: N/A;   Family History  Problem Relation Age of Onset  . Renal cancer Brother   . Diabetes Mother   . Hypertension Mother   . Renal Disease Mother   . Healthy Father    Social History   Socioeconomic History  . Marital status: Widowed    Spouse name: Not on file  . Number of children: 5  . Years of education: Not on file  . Highest education level: Not on file  Occupational History  . Occupation: retired  Tobacco Use  . Smoking status: Former Smoker    Packs/day: 1.00    Years: 56.00    Pack years: 56.00    Types: Cigarettes    Quit date: 06/22/2015     Years since quitting: 5.6  . Smokeless tobacco: Never Used  Vaping Use  . Vaping Use: Never used  Substance and Sexual Activity  . Alcohol use: No    Alcohol/week: 0.0 standard drinks  . Drug use: No  . Sexual activity: Not Currently  Other Topics Concern  . Not on file  Social History Narrative  . Not on file   Social Determinants of Health   Financial Resource Strain: Low Risk   . Difficulty of Paying Living Expenses: Not hard at all  Food Insecurity: No Food Insecurity  . Worried About Charity fundraiser in the Last Year: Never true  . Ran Out of Food in the Last Year: Never true  Transportation Needs: No Transportation Needs  . Lack of Transportation (Medical): No  .  Lack of Transportation (Non-Medical): No  Physical Activity: Inactive  . Days of Exercise per Week: 0 days  . Minutes of Exercise per Session: 0 min  Stress: No Stress Concern Present  . Feeling of Stress : Not at all  Social Connections: Not on file    Tobacco Counseling Counseling given: Not Answered   Clinical Intake:  Pre-visit preparation completed: Yes  Pain : No/denies pain     Nutritional Status: BMI > 30  Obese Nutritional Risks: None Diabetes: No  How often do you need to have someone help you when you read instructions, pamphlets, or other written materials from your doctor or pharmacy?: 1 - Never What is the last grade level you completed in school?: 12th grade  Diabetic? Yes Nutrition Risk Assessment:  Has the patient had any N/V/D within the last 2 months?  No  Does the patient have any non-healing wounds?  No  Has the patient had any unintentional weight loss or weight gain?  No   Diabetes:  Is the patient diabetic?  Yes  If diabetic, was a CBG obtained today?  No  Did the patient bring in their glucometer from home?  No  How often do you monitor your CBG's? Does not.   Financial Strains and Diabetes Management:  Are you having any financial strains with the device,  your supplies or your medication? No .  Does the patient want to be seen by Chronic Care Management for management of their diabetes?  No  Would the patient like to be referred to a Nutritionist or for Diabetic Management?  No   Diabetic Exams:  Diabetic Eye Exam: Completed 02/26/2020 Diabetic Foot Exam: Overdue, Pt has been advised about the importance in completing this exam. Pt is scheduled for diabetic foot exam on next appointment.  Interpreter Needed?: No  Information entered by :: NAllen LPN   Activities of Daily Living In your present state of health, do you have any difficulty performing the following activities: 02/05/2021 01/01/2021  Hearing? N N  Vision? N N  Difficulty concentrating or making decisions? N N  Walking or climbing stairs? N Y  Comment - climbing stairs due to breathing  Dressing or bathing? N N  Doing errands, shopping? N N  Preparing Food and eating ? N -  Using the Toilet? N -  In the past six months, have you accidently leaked urine? Y -  Comment with coughing -  Do you have problems with loss of bowel control? N -  Managing your Medications? N -  Managing your Finances? N -  Housekeeping or managing your Housekeeping? N -  Some recent data might be hidden    Patient Care Team: [name], FNP as PCP - General (General Practice) Monna Fam, MD as Consulting Physician (Ophthalmology) Rigoberto Noel, MD as Consulting Physician (Pulmonary Disease) Melrose Nakayama, MD as Consulting Physician (Cardiothoracic Surgery) Curt Bears, MD as Consulting Physician (Oncology)  Indicate any recent Medical Services you may have received from other than [facility] providers in the past year (date may be approximate).     Assessment:   This is a routine wellness examination for [facility].  Hearing/Vision screen No exam data present  Dietary issues and exercise activities discussed: Current Exercise Habits: The patient does not participate in  regular exercise at present  Goals Addressed            This Visit's Progress   . Patient Stated       02/05/2021, manage  A1C      Depression Screen PHQ 2/9 Scores 02/05/2021 01/01/2021 12/27/2019 12/27/2019 06/28/2019 05/03/2019 02/15/2019  PHQ - 2 Score 0 0 0 0 0 0 0  PHQ- 9 Score - - 2 - - 3 -    Fall Risk Fall Risk  02/05/2021 01/01/2021 12/27/2019 12/27/2019 06/28/2019  Falls in the past year? 0 0 0 0 0  Risk for fall due to : Medication side effect - Medication side effect - -  Follow up Falls evaluation completed;Education provided;Falls prevention discussed - Falls evaluation completed;Education provided;Falls prevention discussed - -    FALL RISK PREVENTION PERTAINING TO THE HOME:  Any stairs in or around the home? Yes  If so, are there any without handrails? No  Home free of loose throw rugs in walkways, pet beds, electrical cords, etc? Yes  Adequate lighting in your home to reduce risk of falls? Yes   ASSISTIVE DEVICES UTILIZED TO PREVENT FALLS:  Life alert? No  Use of a cane, walker or w/c? No  Grab bars in the bathroom? Yes  Shower chair or bench in shower? Yes  Elevated toilet seat or a handicapped toilet? Yes   TIMED UP AND GO:  Was the test performed? No .    Gait slow and steady without use of assistive device  Cognitive Function:     6CIT Screen 02/05/2021 12/27/2019 05/03/2019 06/14/2018  What Year? 0 points 0 points 0 points 0 points  What month? 0 points 0 points 0 points 0 points  What time? 3 points 3 points 0 points 0 points  Count back from 20 0 points 0 points 0 points 0 points  Months in reverse 0 points 0 points 0 points 0 points  Repeat phrase 6 points 0 points 0 points 0 points  Total Score 9 3 0 0    Immunizations Immunization History  Administered Date(s) Administered  . Fluad Quad(high Dose 65+) 07/01/2020  . Influenza, High Dose Seasonal PF 06/06/2016, 05/01/2017, 05/31/2018, 06/28/2019  . Influenza, Quadrivalent, Recombinant, Inj, Pf  06/03/2019  . Influenza-Unspecified 05/01/2017  . Moderna Sars-Covid-2 Vaccination 10/19/2019, 11/16/2019  . Pneumococcal Conjugate-13 07/17/2014, 06/15/2018  . Pneumococcal Polysaccharide-23 09/07/2011    TDAP status: Up to date  Flu Vaccine status: Up to date  Pneumococcal vaccine status: Up to date  Covid-19 vaccine status: Completed vaccines  Qualifies for Shingles Vaccine? Yes   Zostavax completed No   Shingrix Completed?: No.    Education has been provided regarding the importance of this vaccine. Patient has been advised to call insurance company to determine out of pocket expense if they have not yet received this vaccine. Advised may also receive vaccine at local pharmacy or Health Dept. Verbalized acceptance and understanding.  Screening Tests Health Maintenance  Topic Date Due  . DEXA SCAN  Never done  . Zoster Vaccines- Shingrix (1 of 2) Never done  . COVID-19 Vaccine (3 - Moderna risk 4-dose series) 12/14/2019  . URINE MICROALBUMIN  03/31/2021  . INFLUENZA VACCINE  04/06/2021  . MAMMOGRAM  07/23/2021  . TETANUS/TDAP  10/30/2022  . Hepatitis C Screening  Completed  . PNA vac Low Risk Adult  Completed  . HPV VACCINES  Aged Out    Health Maintenance  Health Maintenance Due  Topic Date Due  . DEXA SCAN  Never done  . Zoster Vaccines- Shingrix (1 of 2) Never done  . COVID-19 Vaccine (3 - Moderna risk 4-dose series) 12/14/2019  . URINE MICROALBUMIN  03/31/2021    Colorectal cancer  screening: No longer required.   Mammogram status: Completed 07/23/2020. Repeat every year  Bone Density status: Completed 07/23/2020.   Lung Cancer Screening: (Low Dose CT Chest recommended if Age 40-80 years, 30 pack-year currently smoking OR have quit w/in 15years.) does qualify.   Lung Cancer Screening Referral: CT scan 05/28/2020  Additional Screening:  Hepatitis C Screening: does qualify; Completed 03/31/2020  Vision Screening: Recommended annual ophthalmology exams for  early detection of glaucoma and other disorders of the eye. Is the patient up to date with their annual eye exam?  No  Who is the provider or what is the name of the office in which the patient attends annual eye exams? Dr. Kathlen Mody If pt is not established with a provider, would they like to be referred to a provider to establish care? No .   Dental Screening: Recommended annual dental exams for proper oral hygiene  Community Resource Referral / Chronic Care Management: CRR required this visit?  No   CCM required this visit?  No      Plan:     I have personally reviewed and noted the following in the patient's chart:   . Medical and social history . Use of alcohol, tobacco or illicit drugs  . Current medications and supplements including opioid prescriptions.  . Functional ability and status . Nutritional status . Physical activity . Advanced directives . List of other physicians . Hospitalizations, surgeries, and ER visits in previous 12 months . Vitals . Screenings to include cognitive, depression, and falls . Referrals and appointments  In addition, I have reviewed and discussed with patient certain preventive protocols, quality metrics, and best practice recommendations. A written personalized care plan for preventive services as well as general preventive health recommendations were provided to patient.     Kellie Simmering, LPN   05/16/3715   Nurse Notes:

## 2021-02-05 NOTE — Patient Instructions (Signed)
[name] , Thank you for taking time to come for your Medicare Wellness Visit. I appreciate your ongoing commitment to your health goals. Please review the following plan we discussed and let me know if I can assist you in the future.   Screening recommendations/referrals: Colonoscopy: not required Mammogram: completed 07/23/2020 Bone Density: completed 07/23/2020 Recommended yearly ophthalmology/optometry visit for glaucoma screening and checkup Recommended yearly dental visit for hygiene and checkup  Vaccinations: Influenza vaccine: completed 07/01/2020, due 04/06/2021 Pneumococcal vaccine: completed 06/15/2018 Tdap vaccine: completed 10/30/2012, due 10/30/2022 Shingles vaccine: discussed   Covid-19: 09/10/2020, 11/16/2019, 10/19/2019  Advanced directives: Advance directive discussed with you today. Even though you declined this today please call our office should you change your mind and we can give you the proper paperwork for you to fill out.  Conditions/risks identified: none  Next appointment: Follow up in one year for your annual wellness visit    Preventive Care 65 Years and Older, Female Preventive care refers to lifestyle choices and visits with your health care provider that can promote health and wellness. What does preventive care include?  A yearly physical exam. This is also called an annual well check.  Dental exams once or twice a year.  Routine eye exams. Ask your health care provider how often you should have your eyes checked.  Personal lifestyle choices, including:  Daily care of your teeth and gums.  Regular physical activity.  Eating a healthy diet.  Avoiding tobacco and drug use.  Limiting alcohol use.  Practicing safe sex.  Taking low-dose aspirin every day.  Taking vitamin and mineral supplements as recommended by your health care provider. What happens during an annual well check? The services and screenings done by your health care provider  during your annual well check will depend on your age, overall health, lifestyle risk factors, and family history of disease. Counseling  Your health care provider may ask you questions about your:  Alcohol use.  Tobacco use.  Drug use.  Emotional well-being.  Home and relationship well-being.  Sexual activity.  Eating habits.  History of falls.  Memory and ability to understand (cognition).  Work and work Statistician.  Reproductive health. Screening  You may have the following tests or measurements:  Height, weight, and BMI.  Blood pressure.  Lipid and cholesterol levels. These may be checked every 5 years, or more frequently if you are over 59 years old.  Skin check.  Lung cancer screening. You may have this screening every year starting at age 33 if you have a 30-pack-year history of smoking and currently smoke or have quit within the past 15 years.  Fecal occult blood test (FOBT) of the stool. You may have this test every year starting at age 70.  Flexible sigmoidoscopy or colonoscopy. You may have a sigmoidoscopy every 5 years or a colonoscopy every 10 years starting at age 53.  Hepatitis C blood test.  Hepatitis B blood test.  Sexually transmitted disease (STD) testing.  Diabetes screening. This is done by checking your blood sugar (glucose) after you have not eaten for a while (fasting). You may have this done every 1-3 years.  Bone density scan. This is done to screen for osteoporosis. You may have this done starting at age 3.  Mammogram. This may be done every 1-2 years. Talk to your health care provider about how often you should have regular mammograms. Talk with your health care provider about your test results, treatment options, and if necessary, the need for  more tests. Vaccines  Your health care provider may recommend certain vaccines, such as:  Influenza vaccine. This is recommended every year.  Tetanus, diphtheria, and acellular pertussis  (Tdap, Td) vaccine. You may need a Td booster every 10 years.  Zoster vaccine. You may need this after age 43.  Pneumococcal 13-valent conjugate (PCV13) vaccine. One dose is recommended after age 34.  Pneumococcal polysaccharide (PPSV23) vaccine. One dose is recommended after age 49. Talk to your health care provider about which screenings and vaccines you need and how often you need them. This information is not intended to replace advice given to you by your health care provider. Make sure you discuss any questions you have with your health care provider. Document Released: 09/19/2015 Document Revised: 05/12/2016 Document Reviewed: 06/24/2015 Elsevier Interactive Patient Education  2017 [employer] Prevention in the Home Falls can cause injuries. They can happen to people of all ages. There are many things you can do to make your home safe and to help prevent falls. What can I do on the outside of my home?  Regularly fix the edges of walkways and driveways and fix any cracks.  Remove anything that might make you trip as you walk through a door, such as a raised step or threshold.  Trim any bushes or trees on the path to your home.  Use bright outdoor lighting.  Clear any walking paths of anything that might make someone trip, such as rocks or tools.  Regularly check to see if handrails are loose or broken. Make sure that both sides of any steps have handrails.  Any raised decks and porches should have guardrails on the edges.  Have any leaves, snow, or ice cleared regularly.  Use sand or salt on walking paths during winter.  Clean up any spills in your garage right away. This includes oil or grease spills. What can I do in the bathroom?  Use night lights.  Install grab bars by the toilet and in the tub and shower. Do not use towel bars as grab bars.  Use non-skid mats or decals in the tub or shower.  If you need to sit down in the shower, use a plastic, non-slip  stool.  Keep the floor dry. Clean up any water that spills on the floor as soon as it happens.  Remove soap buildup in the tub or shower regularly.  Attach bath mats securely with double-sided non-slip rug tape.  Do not have throw rugs and other things on the floor that can make you trip. What can I do in the bedroom?  Use night lights.  Make sure that you have a light by your bed that is easy to reach.  Do not use any sheets or blankets that are too big for your bed. They should not hang down onto the floor.  Have a firm chair that has side arms. You can use this for support while you get dressed.  Do not have throw rugs and other things on the floor that can make you trip. What can I do in the kitchen?  Clean up any spills right away.  Avoid walking on wet floors.  Keep items that you use a lot in easy-to-reach places.  If you need to reach something above you, use a strong step stool that has a grab bar.  Keep electrical cords out of the way.  Do not use floor polish or wax that makes floors slippery. If you must use wax, use non-skid  floor wax.  Do not have throw rugs and other things on the floor that can make you trip. What can I do with my stairs?  Do not leave any items on the stairs.  Make sure that there are handrails on both sides of the stairs and use them. Fix handrails that are broken or loose. Make sure that handrails are as long as the stairways.  Check any carpeting to make sure that it is firmly attached to the stairs. Fix any carpet that is loose or worn.  Avoid having throw rugs at the top or bottom of the stairs. If you do have throw rugs, attach them to the floor with carpet tape.  Make sure that you have a light switch at the top of the stairs and the bottom of the stairs. If you do not have them, ask someone to add them for you. What else can I do to help prevent falls?  Wear shoes that:  Do not have high heels.  Have rubber bottoms.  Are  comfortable and fit you well.  Are closed at the toe. Do not wear sandals.  If you use a stepladder:  Make sure that it is fully opened. Do not climb a closed stepladder.  Make sure that both sides of the stepladder are locked into place.  Ask someone to hold it for you, if possible.  Clearly mark and make sure that you can see:  Any grab bars or handrails.  First and last steps.  Where the edge of each step is.  Use tools that help you move around (mobility aids) if they are needed. These include:  Canes.  Walkers.  Scooters.  Crutches.  Turn on the lights when you go into a dark area. Replace any light bulbs as soon as they burn out.  Set up your furniture so you have a clear path. Avoid moving your furniture around.  If any of your floors are uneven, fix them.  If there are any pets around you, be aware of where they are.  Review your medicines with your doctor. Some medicines can make you feel dizzy. This can increase your chance of falling. Ask your doctor what other things that you can do to help prevent falls. This information is not intended to replace advice given to you by your health care provider. Make sure you discuss any questions you have with your health care provider. Document Released: 06/19/2009 Document Revised: 01/29/2016 Document Reviewed: 09/27/2014 Elsevier Interactive Patient Education  2017 [employer].

## 2021-02-10 ENCOUNTER — Other Ambulatory Visit: Payer: Self-pay | Admitting: Nurse Practitioner

## 2021-02-11 ENCOUNTER — Other Ambulatory Visit: Payer: Self-pay

## 2021-02-11 MED ORDER — TRUE METRIX AIR GLUCOSE METER W/DEVICE KIT: PACK | 2 refills | Status: DC

## 2021-02-11 MED ORDER — TRUEPLUS LANCETS 33G MISC: 11 refills | Status: DC

## 2021-02-11 MED ORDER — TRUE METRIX BLOOD GLUCOSE TEST VI STRP: ORAL_STRIP | 12 refills | Status: DC

## 2021-02-12 ENCOUNTER — Other Ambulatory Visit: Payer: Self-pay

## 2021-02-12 ENCOUNTER — Ambulatory Visit: Payer: Medicare HMO | Admitting: Adult Health

## 2021-02-12 MED ORDER — TRUE METRIX BLOOD GLUCOSE TEST VI STRP: ORAL_STRIP | 12 refills | Status: DC

## 2021-02-12 MED ORDER — TRUEPLUS LANCETS 33G MISC: 11 refills | Status: DC

## 2021-02-12 MED ORDER — TRUE METRIX AIR GLUCOSE METER W/DEVICE KIT: PACK | 2 refills | Status: AC

## 2021-02-16 ENCOUNTER — Encounter: Payer: Self-pay | Admitting: Adult Health

## 2021-02-16 ENCOUNTER — Other Ambulatory Visit: Payer: Self-pay

## 2021-02-16 ENCOUNTER — Ambulatory Visit (INDEPENDENT_AMBULATORY_CARE_PROVIDER_SITE_OTHER): Payer: Medicare HMO | Admitting: Adult Health

## 2021-02-16 DIAGNOSIS — K5900 Constipation, unspecified: Secondary | ICD-10-CM | POA: Insufficient documentation

## 2021-02-16 DIAGNOSIS — R933 Abnormal findings on diagnostic imaging of other parts of digestive tract: Secondary | ICD-10-CM | POA: Insufficient documentation

## 2021-02-16 DIAGNOSIS — J449 Chronic obstructive pulmonary disease, unspecified: Secondary | ICD-10-CM | POA: Diagnosis not present

## 2021-02-16 DIAGNOSIS — C3492 Malignant neoplasm of unspecified part of left bronchus or lung: Secondary | ICD-10-CM | POA: Diagnosis not present

## 2021-02-16 DIAGNOSIS — K5730 Diverticulosis of large intestine without perforation or abscess without bleeding: Secondary | ICD-10-CM | POA: Insufficient documentation

## 2021-02-16 DIAGNOSIS — R1032 Left lower quadrant pain: Secondary | ICD-10-CM | POA: Insufficient documentation

## 2021-02-16 DIAGNOSIS — J309 Allergic rhinitis, unspecified: Secondary | ICD-10-CM | POA: Diagnosis not present

## 2021-02-16 HISTORY — DX: Left lower quadrant pain: R10.32

## 2021-02-16 MED ORDER — ALBUTEROL SULFATE HFA 108 (90 BASE) MCG/ACT IN AERS: 2.0000 | INHALATION_SPRAY | Freq: Four times a day (QID) | RESPIRATORY_TRACT | 2 refills | Status: DC | PRN

## 2021-02-16 NOTE — Progress Notes (Signed)
_0  ID: [name], female    DOB: 1941-04-19, 80 y.o.   MRN: [id number]  Chief Complaint  Patient presents with   Follow-up    Referring provider: [name], FNP  HPI: 80 year old female former smoker followed for COPD and lung cancer Patient underwent a left VATS with left upper lobectomy and mediastinal lymph node dissection October 2016-path showed invasive adenocarcinoma, adenocarcinoma involving the visceral pleura in addition to the lymphovascular invasion.  Resection margins as well as dissected lymph nodes were negative for malignancy.  Patient did have a persistent air leak postop and required endobronchial valves for resolution  TEST/EVENTS :  /2020 CT chest >> stable areas of scarring left upper lobe postsurgical, right lower lobe nodular scarring 12/2017 CT chest >> stable, nodular area of left lung scarring 2016 Pre-op PFT - FEV1 73 %, DLCO 48%   03/2016 FEV1 66%   Spirometry 10/2017 shows an FEV1 at 63% ratio 57 and FVC of 86%.    CT chest September 2020 showed stable nodular areas of architectural distortion in the lungs bilaterally most compatible with areas of chronic postinfectious scarring.  No evidence of recurrent or metastatic disease in the thorax.  Moderate emphysema.  02/16/2021 Follow up : COPD , Lung Cancer  Patient presents for a 53-monthfollow-up.  Patient has underlying moderate COPD.  She is maintained on Trelegy inhaler daily.  Patient says overall breathing is doing about the same.  She gets winded with moderate activities.  She is able to do some light housework and is able to do her own shopping.  Her nephew does live with her but he is a fCatering managerand is not always at home.  She says she does her activities and rest frequently.  She says appetite is fair.  Her weight has been stable.  She does have some chronic allergies and uses Zyrtec and Flonase as needed.  She does have a history of lung cancer diagnosed in 2016.  She is followed  with serial CT chest.  CT chest September 2021 showed no evidence of recurrent or metastatic disease in the chest.  Stable scarring/rounded atelectasis in the lung bases.  With emphysema.  She has a follow-up CT later this fall and follow-up with oncology she denies any hemoptysis or unintentional weight loss     No Known Allergies  Immunization History  Administered Date(s) Administered   Fluad Quad(high Dose 65+) 07/01/2020   Influenza, High Dose Seasonal PF 06/06/2016, 05/01/2017, 05/31/2018, 06/28/2019   Influenza, Quadrivalent, Recombinant, Inj, Pf 06/03/2019   Influenza-Unspecified 05/01/2017   Moderna Sars-Covid-2 Vaccination 10/19/2019, 11/16/2019   Pneumococcal Conjugate-13 07/17/2014, 06/15/2018   Pneumococcal Polysaccharide-23 09/07/2011    Past Medical History:  Diagnosis Date   Arthritis    Asthma    COPD (chronic obstructive pulmonary disease) ([facility]    Depression    GERD (gastroesophageal reflux disease)    Hyperlipidemia    Hypertension 06/15/2016   left lung ca dx'd 2016   Thyroid nodule     Tobacco History: Social History   Tobacco Use  Smoking Status Former   Packs/day: 1.00   Years: 56.00   Pack years: 56.00   Types: Cigarettes   Quit date: 06/22/2015   Years since quitting: 5.6  Smokeless Tobacco Never   Counseling given: Not Answered   Outpatient Medications Prior to Visit  Medication Sig Dispense Refill   albuterol (PROVENTIL) (2.5 MG/3ML) 0.083% nebulizer solution Take 3 mLs (2.5 mg total) by nebulization every 6 (six) hours  as needed for wheezing or shortness of breath. 75 mL 12   aspirin 81 MG tablet Take 81 mg by mouth every evening.      Blood Glucose Monitoring Suppl (TRUE METRIX AIR GLUCOSE METER) w/Device KIT Use to check blood sugar 4 times a day. Dx code e11.65 1 kit 2   Calcium Carbonate-Vitamin D 600-400 MG-UNIT tablet Take 1 tablet by mouth daily. (Patient taking differently: Take 1 tablet by mouth daily. 600 mg of calcium and 500  units of vitd)     cyclobenzaprine (FLEXERIL) 5 MG tablet Take 1 tablet (5 mg total) by mouth 3 (three) times daily as needed for muscle spasms. 20 tablet 0   cycloSPORINE (RESTASIS) 0.05 % ophthalmic emulsion 1 drop 2 (two) times daily.     donepezil (ARICEPT) 10 MG tablet TAKE 1 TABLET BY MOUTH EVERY DAY IN THE EVENING 90 tablet 1   ezetimibe (ZETIA) 10 MG tablet TAKE 1 TABLET BY MOUTH EVERYDAY AT BEDTIME 90 tablet 1   flunisolide (NASALIDE) 25 MCG/ACT (0.025%) SOLN PLACE 2 SPRAYS INTO THE NOSE 2 (TWO) TIMES DAILY. 25 mL 1   glucose blood (TRUE METRIX BLOOD GLUCOSE TEST) test strip Use as instructed to check blood sugar dx code e11.65 100 each 12   KLOR-CON M20 20 MEQ tablet TAKE 1 TABLET BY MOUTH EVERY DAY WITH FOOD 90 tablet 1   magnesium oxide (MAG-OX) 400 MG tablet Take 400 mg by mouth daily.     Melatonin 2.5 MG CAPS Take 2.5 mg by mouth at bedtime as needed (sleep).      mirtazapine (REMERON) 15 MG tablet TAKE 1 TABLET BY MOUTH EVERYDAY AT BEDTIME 90 tablet 1   Multiple Vitamin (MULTIVITAMIN) tablet Take 1 tablet by mouth daily.     niacin (NIASPAN) 500 MG CR tablet TAKE 1 TABLET BY MOUTH EVERY DAY IN THE EVENING 90 tablet 3   OVER THE COUNTER MEDICATION Take 1 tablet by mouth daily.     pantoprazole (PROTONIX) 40 MG tablet TAKE 1 TABLET BY MOUTH TWICE A DAY 180 tablet 1   predniSONE (DELTASONE) 10 MG tablet Take 2 tabs daily with food x 5ds, then 1 tab daily with food x 5ds then STOP 15 tablet 0   Probiotic Product (PROBIOTIC PO) Take by mouth.     TRELEGY ELLIPTA 100-62.5-25 MCG/INH AEPB INHALE 1 PUFF ONE TIME DAILY 180 each 0   TRUEplus Lancets 33G MISC Use to test blood sugar 100 each 11   valACYclovir (VALTREX) 500 MG tablet TAKE 1 TABLET BY MOUTH EVERY DAY 90 tablet 0   White Petrolatum-Mineral Oil (SYSTANE NIGHTTIME OP) Place 1 drop into both eyes at bedtime as needed (for dry eyes).      albuterol (VENTOLIN HFA) 108 (90 Base) MCG/ACT inhaler INHALE 2 PUFFS INTO THE LUNGS EVERY 6  HOURS AS NEEDED FOR WHEEZING OR SHORTNESS OF BREATH 6.7 g 2   No facility-administered medications prior to visit.     Review of Systems:   Constitutional:   No  weight loss, night sweats,  Fevers, chills,  +fatigue, or  lassitude.  HEENT:   No headaches,  Difficulty swallowing,  Tooth/dental problems, or  Sore throat,                No sneezing, itching, ear ache, nasal congestion, post nasal drip,   CV:  No chest pain,  Orthopnea, PND, swelling in lower extremities, anasarca, dizziness, palpitations, syncope.   GI  No heartburn, indigestion, abdominal pain, nausea, vomiting,  diarrhea, change in bowel habits, loss of appetite, bloody stools.   Resp:   No chest wall deformity  Skin: no rash or lesions.  GU: no dysuria, change in color of urine, no urgency or frequency.  No flank pain, no hematuria   MS:  No joint pain or swelling.  No decreased range of motion.  No back pain.    Physical Exam  BP 122/80   Pulse (!) 104   Ht 5' 2" (1.575 m)   Wt 165 lb (74.8 kg)   SpO2 96%   BMI 30.18 kg/m   GEN: A/Ox3; pleasant , NAD, well nourished    HEENT:  [place]/AT,   NOSE-clear, THROAT-clear, no lesions, no postnasal drip or exudate noted.   NECK:  Supple w/ fair ROM; no JVD; normal carotid impulses w/o bruits; no thyromegaly or nodules palpated; no lymphadenopathy.    RESP  Clear  P & A; w/o, wheezes/ rales/ or rhonchi. no accessory muscle use, no dullness to percussion  CARD:  RRR, no m/r/g, no peripheral edema, pulses intact, no cyanosis or clubbing.  GI:   Soft & nt; nml bowel sounds; no organomegaly or masses detected.   Musco: Warm bil, no deformities or joint swelling noted.   Neuro: alert, no focal deficits noted.    Skin: Warm, no lesions or rashes    Lab Results:  CBC   BNP No results found for: BNP  ProBNP No results found for: PROBNP  Imaging: No results found.    PFT Results Latest Ref Rng & Units 06/09/2015  FVC-Pre L 2.04  FVC-Predicted Pre %  93  FVC-Post L 2.02  FVC-Predicted Post % 92  Pre FEV1/FVC % % 60  Post FEV1/FCV % % 62  FEV1-Pre L 1.22  FEV1-Predicted Pre % 72  FEV1-Post L 1.24  DLCO uncorrected ml/min/mmHg 11.34  DLCO [facility]% % 48  DLVA Predicted % 49  TLC L 4.77  TLC % Predicted % 95  RV % Predicted % 111    No results found for: NITRICOXIDE      Assessment & Plan:   COPD (chronic obstructive pulmonary disease) (HCC) Compensated on present regimen.  Patient's continue with activity as tolerated.  Plan  Patient Instructions  Continue on TRELEGY daily . Rinse after use.  Albuterol inhaler or Neb As needed   Continue on Zyrtec 5m At bedtime As needed    Saline nasal rinses As needed   Continue on Flonase daily As needed   Follow up with CT chest this fall as planned .  Follow up with Dr MJulien Nordmannthis fall .  Follow up with Dr. AElsworth Soho In 6 months and  and As needed   Please contact office for sooner follow up if symptoms do not improve or worsen or seek emergency care        Rhinitis, allergic Continue on current regimen. Zyrtec and Flonase as needed Trigger prevention  Adenocarcinoma of left lung ([facility] Most recent CT chest September 2021 showed no evidence of recurrent or metastatic disease.  Continue with serial CT follow-up and oncology follow-up this fall.    I spent  37   minutes dedicated to the care of this patient on the date of this encounter to include pre-visit review of records, face-to-face time with the patient discussing conditions above, post visit ordering of testing, clinical documentation with the electronic health record, making appropriate referrals as documented, and communicating necessary findings to members of the patients care team.   TRexene Edison  NP 02/16/2021

## 2021-02-16 NOTE — Patient Instructions (Addendum)
Continue on TRELEGY daily . Rinse after use.  Albuterol inhaler or Neb As needed   Continue on Zyrtec 10mg  At bedtime As needed    Saline nasal rinses As needed   Continue on Flonase daily As needed   Follow up with CT chest this fall as planned .  Follow up with Dr Julien Nordmann this fall .  Follow up with Dr. Elsworth Soho  In 6 months and  and As needed   Please contact office for sooner follow up if symptoms do not improve or worsen or seek emergency care

## 2021-02-16 NOTE — Assessment & Plan Note (Signed)
Most recent CT chest September 2021 showed no evidence of recurrent or metastatic disease.  Continue with serial CT follow-up and oncology follow-up this fall.

## 2021-02-16 NOTE — Assessment & Plan Note (Signed)
Continue on current regimen. Zyrtec and Flonase as needed Trigger prevention

## 2021-02-16 NOTE — Assessment & Plan Note (Signed)
Compensated on present regimen.  Patient's continue with activity as tolerated.  Plan  Patient Instructions  Continue on TRELEGY daily . Rinse after use.  Albuterol inhaler or Neb As needed   Continue on Zyrtec 10mg  At bedtime As needed    Saline nasal rinses As needed   Continue on Flonase daily As needed   Follow up with CT chest this fall as planned .  Follow up with Dr Julien Nordmann this fall .  Follow up with Dr. Elsworth Soho  In 6 months and  and As needed   Please contact office for sooner follow up if symptoms do not improve or worsen or seek emergency care

## 2021-02-17 ENCOUNTER — Other Ambulatory Visit: Payer: Self-pay

## 2021-02-17 MED ORDER — TRUE METRIX BLOOD GLUCOSE TEST VI STRP: ORAL_STRIP | 12 refills | Status: DC

## 2021-02-17 MED ORDER — TRUEPLUS LANCETS 33G MISC: 11 refills | Status: DC

## 2021-02-18 ENCOUNTER — Other Ambulatory Visit: Payer: Self-pay | Admitting: Nurse Practitioner

## 2021-02-26 DIAGNOSIS — H35033 Hypertensive retinopathy, bilateral: Secondary | ICD-10-CM | POA: Diagnosis not present

## 2021-02-26 DIAGNOSIS — H04123 Dry eye syndrome of bilateral lacrimal glands: Secondary | ICD-10-CM | POA: Diagnosis not present

## 2021-02-26 DIAGNOSIS — Z961 Presence of intraocular lens: Secondary | ICD-10-CM | POA: Diagnosis not present

## 2021-02-26 DIAGNOSIS — H35363 Drusen (degenerative) of macula, bilateral: Secondary | ICD-10-CM | POA: Diagnosis not present

## 2021-02-26 LAB — HM DIABETES EYE EXAM

## 2021-03-04 ENCOUNTER — Other Ambulatory Visit: Payer: Self-pay

## 2021-03-04 ENCOUNTER — Encounter: Payer: Self-pay | Admitting: Skilled Nursing Facility1

## 2021-03-04 ENCOUNTER — Encounter: Payer: Medicare HMO | Attending: Nurse Practitioner | Admitting: Skilled Nursing Facility1

## 2021-03-04 DIAGNOSIS — E119 Type 2 diabetes mellitus without complications: Secondary | ICD-10-CM | POA: Insufficient documentation

## 2021-03-04 NOTE — Progress Notes (Addendum)
Diabetes Self-Management Education  Visit Type: First/Initial   03/04/2021  [name], identified by name and date of birth, is a 80 y.o. female with a diagnosis of Diabetes: Type 2.   For next appt: check in on digestion feelings    May need to discuss metamucil   ASSESSMENT  There were no vitals taken for this visit. There is no height or weight on file to calculate BMI.  Pt state she is not comfortable with technology or the Internet including Youtube.   Pt states she does not want to take metformin for diabetes.  Pt states her Protonix causes muscle pains for her.  Pt states she does have reflux. Pt staet she feels her foods do not digest as quickly as she believes they should making her feel bloated throughout the day and reducing her appetite.  Pt states she checks her blood pressure a couple times a week and states it is within normal limits: 127/78.  Pt states her depression medicine works very well for her.  Pt states she does have diverticulosis.  Pt states she wakes up about 11 or 12 after letting her dogs out in the morning.  Pt states recently she has not wanted white bread or white rice.  Pt states she has an appt coming up for her dentures.  Pt states she really does not have much of an appt for the second meal but makes herself eat it to take her medication. Pt state she really does not want to wake up any earlier. Pt states she goes out with the dogs walks with them.  Pt states she does over eat peppermints thinking it is because she is bored stating she thinks they took the place of her cigarettes.   Goals: When reading the meal replacements look for around 15 grams of carbohydrate and single digits of sugar Try waking at 10am and having a drink like boost + yogurt or 2 slices of bread with peanut butter and then a snack around 1pm such as grapes or cottage cheese + pear  And then dinner at 6pm or 7pm Do your weights 3 times a week Check your blood  sugar once every day sometimes before you eat anything in the morning and sometimes 2 hours after a meal: in the morning under 130 after any meal under 180 Always above 70 If you blood sugar is below 70 eat  Limit the peppermints to 3 PER DAY You can try cooked carrots in a little water in the microwave    Diabetes Self-Management Education - 03/04/21 1408       Visit Information   Visit Type First/Initial      Initial Visit   Diabetes Type Type 2    Are you currently following a meal plan? No    Are you taking your medications as prescribed? Not on Medications      Health Coping   How would you rate your overall health? Good      Psychosocial Assessment   Patient Belief/Attitude about Diabetes Afraid    Self-care barriers None    Self-management support Friends;Family    Patient Concerns Nutrition/Meal planning    Special Needs None    Preferred Learning Style Hands on    Learning Readiness Contemplating      Pre-Education Assessment   Patient understands the diabetes disease and treatment process. Needs Instruction    Patient understands incorporating nutritional management into lifestyle. Needs Instruction    Patient undertands incorporating physical  activity into lifestyle. Needs Instruction    Patient understands using medications safely. Needs Instruction    Patient understands monitoring blood glucose, interpreting and using results Needs Instruction    Patient understands prevention, detection, and treatment of acute complications. Needs Instruction    Patient understands prevention, detection, and treatment of chronic complications. Needs Instruction    Patient understands how to develop strategies to address psychosocial issues. Needs Instruction    Patient understands how to develop strategies to promote health/change behavior. Needs Instruction      Complications   Last HgB A1C per patient/outside source 7 %    How often do you check your blood sugar? 0 times/day  (not testing)    Number of hypoglycemic episodes per month 0    Number of hyperglycemic episodes per week 0    Have you had a dilated eye exam in the past 12 months? Yes    Have you had a dental exam in the past 12 months? No    Are you checking your feet? Yes    How many days per week are you checking your feet? 7      Dietary Intake   Breakfast skipped    Lunch peanut butter on raisin bread (2 slices) + carnation + activia yogurt    Dinner 2 BBQ ribs + cabbage + potatoes + garlic pr pasta dish or baked chicken or steak + potato + salad    Beverage(s) carnation instant, ICE, water      Exercise   Exercise Type Light (walking / raking leaves)    How many days per week to you exercise? 2    How many minutes per day do you exercise? 20    Total minutes per week of exercise 40      Patient Education   Previous Diabetes Education No    Disease state  Factors that contribute to the development of diabetes    Acute complications Taught treatment of hypoglycemia - the 15 rule.      Individualized Goals (developed by patient)   Nutrition General guidelines for healthy choices and portions discussed;Follow meal plan discussed    Physical Activity Exercise 3-5 times per week;15 minutes per day    Monitoring  test my blood glucose as discussed      Post-Education Assessment   Patient understands the diabetes disease and treatment process. Demonstrates understanding / competency    Patient understands incorporating nutritional management into lifestyle. Demonstrates understanding / competency    Patient undertands incorporating physical activity into lifestyle. Demonstrates understanding / competency    Patient understands using medications safely. Demonstrates understanding / competency    Patient understands monitoring blood glucose, interpreting and using results Demonstrates understanding / competency    Patient understands prevention, detection, and treatment of acute complications.  Demonstrates understanding / competency    Patient understands prevention, detection, and treatment of chronic complications. Demonstrates understanding / competency    Patient understands how to develop strategies to address psychosocial issues. Demonstrates understanding / competency    Patient understands how to develop strategies to promote health/change behavior. Demonstrates understanding / competency      Outcomes   Expected Outcomes Demonstrated interest in learning. Expect positive outcomes    Future DMSE 4-6 wks    Program Status Completed             Individualized Plan for Diabetes Self-Management Training:   Learning Objective:  Patient will have a greater understanding of diabetes self-management. Patient education plan is  to attend individual and/or group sessions per assessed needs and concerns.   Plan:   There are no Patient Instructions on file for this visit.  Expected Outcomes:  Demonstrated interest in learning. Expect positive outcomes  Education material provided: ADA - How to Thrive: A Guide for Your Journey with Diabetes  If problems or questions, patient to contact team via:  Phone  Future DSME appointment: 4-6 wks

## 2021-03-05 ENCOUNTER — Encounter: Payer: Self-pay | Admitting: Nurse Practitioner

## 2021-04-05 ENCOUNTER — Other Ambulatory Visit: Payer: Self-pay | Admitting: Nurse Practitioner

## 2021-04-06 ENCOUNTER — Other Ambulatory Visit: Payer: Self-pay

## 2021-04-06 ENCOUNTER — Encounter: Payer: Medicare HMO | Attending: Nurse Practitioner | Admitting: Skilled Nursing Facility1

## 2021-04-06 DIAGNOSIS — E119 Type 2 diabetes mellitus without complications: Secondary | ICD-10-CM | POA: Diagnosis not present

## 2021-04-06 NOTE — Progress Notes (Signed)
Diabetes Self-Management Education  Visit Type:     04/06/2021  Ms. [name], identified by name and date of birth, is a 80 y.o. female with a diagnosis of Diabetes:  .    ASSESSMENT  There were no vitals taken for this visit. There is no height or weight on file to calculate BMI.   Pt arrived having made most of the changes she set out to make.   Pt state she is not comfortable with technology or the Internet including Youtube.   Pt states couple weeks ago her blood pressure was 144/108: Dietitian advised pt to let her doctor know how high her blood pressure got and higher blood sugars.  Pt states she has been checking her blood sugars daily which have been about 117-120: fasting.  Pt states she is excited to get a break from her 3 dogs for her birthday.   Pt states she has been spacing her yogurt further from her meals.   Pt states she did buy a peddler she tries to do 3 times a week which she likes.  Pt states she has not been eating the peppermints like she was due to getting her new teeth which fit much better.  Pt states she has had regular bowel movements since eating yogurt regularly and taking fiber choice supplement daily.   Pt states she has been realizing there are certain things that cause her to feel bloated namely the instant carnation breakfast.  Pt states she has been doing her 5 pound weights 3-4 times a week.  Pt states she is not able to wake any earlier in the day, it is too difficult of a habit to change.  Medications: Zetia 10mg   Labs: A1C 7.0  Education topic:  Hypoglycemia   Adequate caloric/protein intake   Goals: Increase the protein at your midday meal: try adding a protein powder Call if you continue to feel tired after adding in more protein by the end of the day   Diabetes Self-Management Education  Visit Type: Follow-up   04/06/2021  Ms. [name], identified by name and date of birth, is a 80 y.o. female with a diagnosis of  Diabetes: Type 2.   ASSESSMENT  There were no vitals taken for this visit. There is no height or weight on file to calculate BMI.   Diabetes Self-Management Education - 04/06/21 1406       Visit Information   Visit Type Follow-up      Initial Visit   Diabetes Type Type 2    Are you currently following a meal plan? No    Are you taking your medications as prescribed? Yes      Health Coping   How would you rate your overall health? Good      Psychosocial Assessment   Patient Belief/Attitude about Diabetes Motivated to manage diabetes    Self-care barriers None    Self-management support Friends;Family    Patient Concerns Nutrition/Meal planning    Special Needs None    Learning Readiness Ready    How often do you need to have someone help you when you read instructions, pamphlets, or other written materials from your doctor or pharmacy? 1 - Never      Pre-Education Assessment   Patient understands the diabetes disease and treatment process. Demonstrates understanding / competency    Patient understands incorporating nutritional management into lifestyle. Demonstrates understanding / competency    Patient undertands incorporating physical activity into lifestyle. Demonstrates understanding / competency  Patient understands using medications safely. Demonstrates understanding / competency    Patient understands monitoring blood glucose, interpreting and using results Demonstrates understanding / competency    Patient understands prevention, detection, and treatment of acute complications. Demonstrates understanding / competency    Patient understands prevention, detection, and treatment of chronic complications. Demonstrates understanding / competency    Patient understands how to develop strategies to address psychosocial issues. Demonstrates understanding / competency    Patient understands how to develop strategies to promote health/change behavior. Demonstrates understanding  / competency      Complications   How often do you check your blood sugar? 1-2 times/day    Fasting Blood glucose range (mg/dL) 70-129    Number of hypoglycemic episodes per month 0    Number of hyperglycemic episodes per week 0    Have you had a dilated eye exam in the past 12 months? Yes    Have you had a dental exam in the past 12 months? Yes    Are you checking your feet? Yes    How many days per week are you checking your feet? 7      Dietary Intake   Breakfast 12:30 peanut butter sandwich + boost    Lunch 2:30-3pm activia yogurt    Dinner 6-7pm roasted potatoes + cabbage + chicken or sweet potato + carrots/brussels    Beverage(s) water, ICE drink      Exercise   Exercise Type Light (walking / raking leaves)    How many days per week to you exercise? 3    How many minutes per day do you exercise? 15    Total minutes per week of exercise 45      Patient Education   Previous Diabetes Education Yes (please comment)      Individualized Goals (developed by patient)   Nutrition Follow meal plan discussed;General guidelines for healthy choices and portions discussed    Physical Activity Exercise 3-5 times per week;45 minutes per day    Medications take my medication as prescribed    Monitoring  test my blood glucose as discussed      Patient Self-Evaluation of Goals - Patient rates self as meeting previously set goals (% of time)   Nutrition 50 - 75 %    Physical Activity >75%    Medications >75%    Monitoring 50 - 75 %    Problem Solving >75%    Reducing Risk >75%    Health Coping >75%      Post-Education Assessment   Patient understands the diabetes disease and treatment process. Demonstrates understanding / competency    Patient understands incorporating nutritional management into lifestyle. Demonstrates understanding / competency    Patient undertands incorporating physical activity into lifestyle. Demonstrates understanding / competency    Patient understands using  medications safely. Demonstrates understanding / competency    Patient understands monitoring blood glucose, interpreting and using results Demonstrates understanding / competency    Patient understands prevention, detection, and treatment of acute complications. Demonstrates understanding / competency    Patient understands prevention, detection, and treatment of chronic complications. Demonstrates understanding / competency    Patient understands how to develop strategies to address psychosocial issues. Demonstrates understanding / competency    Patient understands how to develop strategies to promote health/change behavior. Demonstrates understanding / competency      Outcomes   Expected Outcomes Demonstrated interest in learning. Expect positive outcomes    Future DMSE PRN    Program Status Completed  Subsequent Visit   Since your last visit have you continued or begun to take your medications as prescribed? Yes    Since your last visit have you had your blood pressure checked? Yes    Is your most recent blood pressure lower, unchanged, or higher since your last visit? Unchanged    Since your last visit have you experienced any weight changes? No change    Since your last visit, are you checking your blood glucose at least once a day? Yes             Individualized Plan for Diabetes Self-Management Training:   Learning Objective:  Patient will have a greater understanding of diabetes self-management. Patient education plan is to attend individual and/or group sessions per assessed needs and concerns.   Plan:   There are no Patient Instructions on file for this visit.  Expected Outcomes:  Demonstrated interest in learning. Expect positive outcomes  Education material provided: Snack sheet and Diabetes Resources  If problems or questions, patient to contact team via:  Phone  Future DSME appointment: PRN

## 2021-04-10 ENCOUNTER — Other Ambulatory Visit: Payer: Self-pay | Admitting: Nurse Practitioner

## 2021-04-10 DIAGNOSIS — G4700 Insomnia, unspecified: Secondary | ICD-10-CM

## 2021-04-21 ENCOUNTER — Ambulatory Visit (INDEPENDENT_AMBULATORY_CARE_PROVIDER_SITE_OTHER): Payer: Medicare HMO | Admitting: Nurse Practitioner

## 2021-04-21 ENCOUNTER — Encounter: Payer: Self-pay | Admitting: Nurse Practitioner

## 2021-04-21 ENCOUNTER — Other Ambulatory Visit: Payer: Self-pay

## 2021-04-21 VITALS — BP 128/88 | HR 89 | Temp 98.8°F | Ht 62.0 in | Wt 163.2 lb

## 2021-04-21 DIAGNOSIS — G8929 Other chronic pain: Secondary | ICD-10-CM

## 2021-04-21 DIAGNOSIS — N183 Chronic kidney disease, stage 3 unspecified: Secondary | ICD-10-CM

## 2021-04-21 DIAGNOSIS — I10 Essential (primary) hypertension: Secondary | ICD-10-CM | POA: Diagnosis not present

## 2021-04-21 DIAGNOSIS — M5442 Lumbago with sciatica, left side: Secondary | ICD-10-CM

## 2021-04-21 DIAGNOSIS — E1122 Type 2 diabetes mellitus with diabetic chronic kidney disease: Secondary | ICD-10-CM | POA: Diagnosis not present

## 2021-04-21 DIAGNOSIS — E782 Mixed hyperlipidemia: Secondary | ICD-10-CM | POA: Diagnosis not present

## 2021-04-21 DIAGNOSIS — M25511 Pain in right shoulder: Secondary | ICD-10-CM | POA: Diagnosis not present

## 2021-04-21 DIAGNOSIS — R200 Anesthesia of skin: Secondary | ICD-10-CM

## 2021-04-21 DIAGNOSIS — W19XXXA Unspecified fall, initial encounter: Secondary | ICD-10-CM | POA: Diagnosis not present

## 2021-04-21 MED ORDER — KETOROLAC TROMETHAMINE 30 MG/ML IJ SOLN
30.0000 mg | Freq: Once | INTRAMUSCULAR | Status: AC
  Administered 2021-04-21: 30 mg via INTRAMUSCULAR

## 2021-04-21 MED ORDER — HYDROCORTISONE 1 % EX CREA: TOPICAL_CREAM | CUTANEOUS | 5 refills | Status: AC

## 2021-04-21 NOTE — Progress Notes (Signed)
I,Derrius Furtick,acting as a Education administrator for [name], FNP.,have documented all relevant documentation on the behalf of [name], FNP,as directed by  [name], FNP while in the presence of [name], [place].  This visit occurred during the SARS-CoV-2 public health emergency.  Safety protocols were in place, including screening questions prior to the visit, additional usage of staff PPE, and extensive cleaning of exam room while observing appropriate contact time as indicated for disinfecting solutions.  Subjective:     Patient ID: [name] , female    DOB: [birth year] , 80 y.o.   MRN: [id number]   Chief Complaint  Patient presents with   Diabetes    HPI  Pt presents today for DM check, also complaining of back pain on the right side but has been ongoing but is worsening, she also has numbness to her right leg. She is also having right shoulder blade pain after having a fall on Thursday she is having difficulty with raising her right arm. Reporting a fall that happened this past Thursday. She did not happen to go to the ED, at the time " I was not hurt".   She seen the pulmonologist in June.   Diabetes She presents for her follow-up diabetic visit. She has type 2 diabetes mellitus. Her disease course has been stable. Pertinent negatives for hypoglycemia include no dizziness or headaches. Pertinent negatives for diabetes include no fatigue, no polydipsia, no polyphagia and no polyuria. There are no hypoglycemic complications. Risk factors for coronary artery disease include sedentary lifestyle. Current diabetic treatment includes diet. She is following a generally healthy diet. When asked about meal planning, she reported none. She has not had a previous visit with a dietitian. She rarely participates in exercise. (She is not checking her blood sugars) She does not see a podiatrist.Eye exam current: scheduled to see eye doctor in June.  Fall The accident occurred 3 to 5 days ago. The  fall occurred while walking. The point of impact was the right shoulder. The symptoms are aggravated by flexion and rotation. Pertinent negatives include no headaches. She has tried acetaminophen for the symptoms.  Hyperlipidemia This is a chronic problem. The current episode started more than 1 year ago. The problem is controlled. Recent lipid tests were reviewed and are variable. There are no known factors aggravating her hyperlipidemia. Current antihyperlipidemic treatment includes ezetimibe. There are no compliance problems.  Risk factors for coronary artery disease include diabetes mellitus, dyslipidemia and a sedentary lifestyle.    Past Medical History:  Diagnosis Date   Arthritis    Asthma    COPD (chronic obstructive pulmonary disease) ([place])    Depression    Diabetes mellitus without complication (HCC)    Diverticulitis    GERD (gastroesophageal reflux disease)    Hyperlipidemia    Hypertension 06/15/2016   left lung ca dx'd 2016   Thyroid nodule      Family History  Problem Relation Age of Onset   Renal cancer Brother    Diabetes Mother    Hypertension Mother    Renal Disease Mother    Healthy Father      Current Outpatient Medications:    albuterol (PROVENTIL) (2.5 MG/3ML) 0.083% nebulizer solution, Take 3 mLs (2.5 mg total) by nebulization every 6 (six) hours as needed for wheezing or shortness of breath., Disp: 75 mL, Rfl: 12   albuterol (VENTOLIN HFA) 108 (90 Base) MCG/ACT inhaler, Inhale 2 puffs into the lungs every 6 (six) hours as needed for wheezing  or shortness of breath., Disp: 6.7 g, Rfl: 2   aspirin 81 MG tablet, Take 81 mg by mouth every evening. , Disp: , Rfl:    Blood Glucose Monitoring Suppl (TRUE METRIX AIR GLUCOSE METER) w/Device KIT, Use to check blood sugar 4 times a day. Dx code e11.65, Disp: 1 kit, Rfl: 2   cycloSPORINE (RESTASIS) 0.05 % ophthalmic emulsion, 1 drop 2 (two) times daily., Disp: , Rfl:    donepezil (ARICEPT) 10 MG tablet, TAKE 1 TABLET  BY MOUTH EVERY DAY IN THE EVENING, Disp: 90 tablet, Rfl: 1   ezetimibe (ZETIA) 10 MG tablet, TAKE 1 TABLET BY MOUTH EVERYDAY AT BEDTIME, Disp: 90 tablet, Rfl: 1   flunisolide (NASALIDE) 25 MCG/ACT (0.025%) SOLN, PLACE 2 SPRAYS INTO THE NOSE 2 (TWO) TIMES DAILY., Disp: 25 mL, Rfl: 1   glucose blood (TRUE METRIX BLOOD GLUCOSE TEST) test strip, Use as instructed to check blood sugar twice daily dx code e11.9, Disp: 100 each, Rfl: 12   hydrocortisone cream 1 %, Apply to affected area 2 times daily, Disp: 30 g, Rfl: 5   KLOR-CON M20 20 MEQ tablet, TAKE 1 TABLET BY MOUTH EVERY DAY WITH FOOD, Disp: 90 tablet, Rfl: 1   magnesium oxide (MAG-OX) 400 MG tablet, Take 400 mg by mouth daily., Disp: , Rfl:    Melatonin 2.5 MG CAPS, Take 2.5 mg by mouth at bedtime as needed (sleep). , Disp: , Rfl:    mirtazapine (REMERON) 15 MG tablet, TAKE 1 TABLET BY MOUTH EVERYDAY AT BEDTIME, Disp: 90 tablet, Rfl: 1   Multiple Vitamin (MULTIVITAMIN) tablet, Take 1 tablet by mouth daily., Disp: , Rfl:    niacin (NIASPAN) 500 MG CR tablet, TAKE 1 TABLET BY MOUTH EVERY DAY IN THE EVENING, Disp: 90 tablet, Rfl: 3   OVER THE COUNTER MEDICATION, Take 1 tablet by mouth daily., Disp: , Rfl:    pantoprazole (PROTONIX) 40 MG tablet, TAKE 1 TABLET BY MOUTH TWICE A DAY, Disp: 180 tablet, Rfl: 1   Probiotic Product (PROBIOTIC PO), Take by mouth., Disp: , Rfl:    TRELEGY ELLIPTA 100-62.5-25 MCG/INH AEPB, INHALE 1 PUFF ONE TIME DAILY, Disp: 180 each, Rfl: 0   TRUEplus Lancets 33G MISC, Use to test blood sugar twice daily, Disp: 100 each, Rfl: 11   valACYclovir (VALTREX) 500 MG tablet, TAKE 1 TABLET BY MOUTH EVERY DAY, Disp: 90 tablet, Rfl: 0   White Petrolatum-Mineral Oil (SYSTANE NIGHTTIME OP), Place 1 drop into both eyes at bedtime as needed (for dry eyes). , Disp: , Rfl:    Calcium Carbonate-Vitamin D 600-400 MG-UNIT tablet, Take 1 tablet by mouth daily. (Patient taking differently: Take 1 tablet by mouth daily. 600 mg of calcium and 500  units of vitd), Disp:  , Rfl:    cyclobenzaprine (FLEXERIL) 5 MG tablet, Take 1 tablet (5 mg total) by mouth 3 (three) times daily as needed for muscle spasms. (Patient not taking: Reported on 04/21/2021), Disp: 20 tablet, Rfl: 0   predniSONE (DELTASONE) 10 MG tablet, Take 2 tabs daily with food x 5ds, then 1 tab daily with food x 5ds then STOP (Patient not taking: Reported on 04/21/2021), Disp: 15 tablet, Rfl: 0   No Known Allergies   Review of Systems  Constitutional: Negative.  Negative for fatigue.  Respiratory: Negative.    Cardiovascular: Negative.   Endocrine: Negative for polydipsia, polyphagia and polyuria.  Musculoskeletal:        Left Posterior shoulder blade pain and left shoulder  Skin:  Positive  for rash (lateral right ankle with macular rash, slightly erythematous).  Neurological: Negative.  Negative for dizziness and headaches.  Psychiatric/Behavioral: Negative.      Today's Vitals   04/21/21 1620  BP: 128/88  Pulse: 89  Temp: 98.8 F (37.1 C)  SpO2: 99%  Weight: 163 lb 3.2 oz (74 kg)  Height: _0  (1.575 m)  PainSc: 10-Worst pain ever   Body mass index is 29.85 kg/m.  Wt Readings from Last 3 Encounters:  04/21/21 163 lb 3.2 oz (74 kg)  02/16/21 165 lb (74.8 kg)  02/05/21 165 lb (74.8 kg)    Objective:  Physical Exam Vitals reviewed.  Constitutional:      General: She is not in acute distress.    Appearance: Normal appearance.  Cardiovascular:     Pulses: Normal pulses.     Heart sounds: Normal heart sounds. No murmur heard. Pulmonary:     Effort: Pulmonary effort is normal. No respiratory distress.     Breath sounds: Examination of the right-upper field reveals decreased breath sounds. Examination of the left-upper field reveals decreased breath sounds. Examination of the right-middle field reveals decreased breath sounds. Examination of the left-middle field reveals decreased breath sounds. Examination of the right-lower field reveals decreased breath  sounds. Examination of the left-lower field reveals decreased breath sounds. Decreased breath sounds present. No wheezing.     Comments: She does take some time to recover with her breathing with exertion Musculoskeletal:        General: No swelling or tenderness.     Comments: Negative radiculopathy bilateral  Skin:    General: Skin is warm and dry.  Neurological:     General: No focal deficit present.     Mental Status: She is alert and oriented to person, place, and time.     Cranial Nerves: No cranial nerve deficit.     Motor: No weakness.  Psychiatric:        Mood and Affect: Mood normal.        Behavior: Behavior normal.        Thought Content: Thought content normal.        Judgment: Judgment normal.        Assessment And Plan:     1. Diabetes mellitus with stage 3 chronic kidney disease ([place]) Comments: Slightly elevated at last visit, does not want to try metformin due to being on protonix Pending results will try [place] - Hemoglobin A1c  2. Mixed hyperlipidemia Comments: Stable, continue current medications - Lipid panel  3. Essential hypertension Comments: Fair control Continue current medications  4. Chronic right shoulder pain Comments: Pain with flexion and extension Low dose of toradol given due to kidney functions - hydrocortisone cream 1 %; Apply to affected area 2 times daily  Dispense: 30 g; Refill: 5 - ketorolac (TORADOL) 30 MG/ML injection 30 mg  5. Chronic right-sided low back pain with left-sided sciatica Comments: Will check lumbar spine xray pending results will do MRI - DG Lumbar Spine Complete; Future  6. Right leg numbness Comments: Negative radiculopathy  7. Fall, initial encounter Comments: Discussed being mindful when walking, offered PT but declined - DG Ribs Unilateral Right; Future - DG Chest 2 View; Future - ketorolac (TORADOL) 30 MG/ML injection 30 mg    Patient was given opportunity to ask questions. Patient  verbalized understanding of the plan and was able to repeat key elements of the plan. All questions were answered to their satisfaction.  [name], FNP  Teola Bradley, FNP, have reviewed all documentation for this visit. The documentation on 04/21/21 for the exam, diagnosis, procedures, and orders are all accurate and complete.   IF YOU HAVE BEEN REFERRED TO A SPECIALIST, IT MAY TAKE 1-2 WEEKS TO SCHEDULE/PROCESS THE REFERRAL. IF YOU HAVE NOT HEARD FROM US/SPECIALIST IN TWO WEEKS, PLEASE GIVE [name] A CALL AT [phone].   THE PATIENT IS ENCOURAGED TO PRACTICE SOCIAL DISTANCING DUE TO THE COVID-19 PANDEMIC.

## 2021-04-22 ENCOUNTER — Ambulatory Visit
Admission: RE | Admit: 2021-04-22 | Discharge: 2021-04-22 | Disposition: A | Discharge status: Home / Self Care | Payer: Medicare HMO | Source: Ambulatory Visit | Attending: Nurse Practitioner | Admitting: Nurse Practitioner

## 2021-04-22 DIAGNOSIS — R0602 Shortness of breath: Secondary | ICD-10-CM | POA: Diagnosis not present

## 2021-04-22 DIAGNOSIS — W19XXXA Unspecified fall, initial encounter: Secondary | ICD-10-CM

## 2021-04-22 DIAGNOSIS — M545 Low back pain, unspecified: Secondary | ICD-10-CM | POA: Diagnosis not present

## 2021-04-22 DIAGNOSIS — R079 Chest pain, unspecified: Secondary | ICD-10-CM | POA: Diagnosis not present

## 2021-04-22 DIAGNOSIS — G8929 Other chronic pain: Secondary | ICD-10-CM

## 2021-04-22 DIAGNOSIS — R0781 Pleurodynia: Secondary | ICD-10-CM | POA: Diagnosis not present

## 2021-04-22 LAB — CMP14+EGFR
ALT: 34 IU/L — ABNORMAL HIGH (ref 0–32)
AST: 35 IU/L (ref 0–40)
Albumin/Globulin Ratio: 1.6 (ref 1.2–2.2)
Albumin: 4.4 g/dL (ref 3.7–4.7)
Alkaline Phosphatase: 95 IU/L (ref 44–121)
BUN/Creatinine Ratio: 19 (ref 12–28)
BUN: 15 mg/dL (ref 8–27)
Bilirubin Total: 0.4 mg/dL (ref 0.0–1.2)
CO2: 25 mmol/L (ref 20–29)
Calcium: 9.4 mg/dL (ref 8.7–10.3)
Chloride: 103 mmol/L (ref 96–106)
Creatinine, Ser: 0.79 mg/dL (ref 0.57–1.00)
Globulin, Total: 2.7 g/dL (ref 1.5–4.5)
Glucose: 137 mg/dL — ABNORMAL HIGH (ref 65–99)
Potassium: 3.9 mmol/L (ref 3.5–5.2)
Sodium: 143 mmol/L (ref 134–144)
Total Protein: 7.1 g/dL (ref 6.0–8.5)
eGFR: 76 mL/min/{1.73_m2} (ref >59)

## 2021-04-22 LAB — LIPID PANEL
Chol/HDL Ratio: 5.2 ratio — ABNORMAL HIGH (ref 0.0–4.4)
Cholesterol, Total: 213 mg/dL — ABNORMAL HIGH (ref 100–199)
HDL: 41 mg/dL (ref >39)
LDL Chol Calc (NIH): 122 mg/dL — ABNORMAL HIGH (ref 0–99)
Triglycerides: 286 mg/dL — ABNORMAL HIGH (ref 0–149)
VLDL Cholesterol Cal: 50 mg/dL — ABNORMAL HIGH (ref 5–40)

## 2021-04-22 LAB — HEMOGLOBIN A1C
Est. average glucose Bld gHb Est-mCnc: 146 mg/dL
Hgb A1c MFr Bld: 6.7 % — ABNORMAL HIGH (ref 4.8–5.6)

## 2021-05-01 ENCOUNTER — Other Ambulatory Visit: Payer: Self-pay | Admitting: Internal Medicine

## 2021-05-18 ENCOUNTER — Other Ambulatory Visit: Payer: Medicare HMO

## 2021-05-26 ENCOUNTER — Ambulatory Visit (HOSPITAL_COMMUNITY)
Admission: RE | Admit: 2021-05-26 | Discharge: 2021-05-26 | Disposition: A | Discharge status: Home / Self Care | Payer: Medicare HMO | Source: Ambulatory Visit | Attending: Internal Medicine | Admitting: Internal Medicine

## 2021-05-26 ENCOUNTER — Other Ambulatory Visit: Payer: Self-pay

## 2021-05-26 ENCOUNTER — Inpatient Hospital Stay: Payer: Medicare HMO | Attending: Internal Medicine

## 2021-05-26 DIAGNOSIS — C3490 Malignant neoplasm of unspecified part of unspecified bronchus or lung: Secondary | ICD-10-CM | POA: Diagnosis not present

## 2021-05-26 DIAGNOSIS — I10 Essential (primary) hypertension: Secondary | ICD-10-CM | POA: Insufficient documentation

## 2021-05-26 DIAGNOSIS — J439 Emphysema, unspecified: Secondary | ICD-10-CM | POA: Diagnosis not present

## 2021-05-26 DIAGNOSIS — I700 Atherosclerosis of aorta: Secondary | ICD-10-CM | POA: Diagnosis not present

## 2021-05-26 DIAGNOSIS — Z85118 Personal history of other malignant neoplasm of bronchus and lung: Secondary | ICD-10-CM | POA: Diagnosis not present

## 2021-05-26 DIAGNOSIS — Z902 Acquired absence of lung [part of]: Secondary | ICD-10-CM | POA: Insufficient documentation

## 2021-05-26 LAB — CBC WITH DIFFERENTIAL (CANCER CENTER ONLY)
Abs Immature Granulocytes: 0.01 10*3/uL (ref 0.00–0.07)
Basophils Absolute: 0 10*3/uL (ref 0.0–0.1)
Basophils Relative: 0 %
Eosinophils Absolute: 0.1 10*3/uL (ref 0.0–0.5)
Eosinophils Relative: 2 %
HCT: 46.3 % — ABNORMAL HIGH (ref 36.0–46.0)
Hemoglobin: 15.3 g/dL — ABNORMAL HIGH (ref 12.0–15.0)
Immature Granulocytes: 0 %
Lymphocytes Relative: 41 %
Lymphs Abs: 3 10*3/uL (ref 0.7–4.0)
MCH: 31.7 pg (ref 26.0–34.0)
MCHC: 33 g/dL (ref 30.0–36.0)
MCV: 96.1 fL (ref 80.0–100.0)
Monocytes Absolute: 0.6 10*3/uL (ref 0.1–1.0)
Monocytes Relative: 8 %
Neutro Abs: 3.6 10*3/uL (ref 1.7–7.7)
Neutrophils Relative %: 49 %
Platelet Count: 216 10*3/uL (ref 150–400)
RBC: 4.82 MIL/uL (ref 3.87–5.11)
RDW: 13.9 % (ref 11.5–15.5)
WBC Count: 7.4 10*3/uL (ref 4.0–10.5)
nRBC: 0 % (ref 0.0–0.2)

## 2021-05-26 LAB — CMP (CANCER CENTER ONLY)
ALT: 24 U/L (ref 0–44)
AST: 32 U/L (ref 15–41)
Albumin: 4.2 g/dL (ref 3.5–5.0)
Alkaline Phosphatase: 85 U/L (ref 38–126)
Anion gap: 11 (ref 5–15)
BUN: 14 mg/dL (ref 8–23)
CO2: 29 mmol/L (ref 22–32)
Calcium: 10.6 mg/dL — ABNORMAL HIGH (ref 8.9–10.3)
Chloride: 101 mmol/L (ref 98–111)
Creatinine: 1.23 mg/dL — ABNORMAL HIGH (ref 0.44–1.00)
GFR, Estimated: 44 mL/min — ABNORMAL LOW (ref >60)
Glucose, Bld: 142 mg/dL — ABNORMAL HIGH (ref 70–99)
Potassium: 3.9 mmol/L (ref 3.5–5.1)
Sodium: 141 mmol/L (ref 135–145)
Total Bilirubin: 0.6 mg/dL (ref 0.3–1.2)
Total Protein: 7.9 g/dL (ref 6.5–8.1)

## 2021-05-26 MED ORDER — IOHEXOL 350 MG/ML SOLN
100.0000 mL | Freq: Once | INTRAVENOUS | Status: AC | PRN
  Administered 2021-05-26: 60 mL via INTRAVENOUS

## 2021-06-02 ENCOUNTER — Other Ambulatory Visit: Payer: Self-pay

## 2021-06-02 ENCOUNTER — Inpatient Hospital Stay (HOSPITAL_BASED_OUTPATIENT_CLINIC_OR_DEPARTMENT_OTHER): Payer: Medicare HMO | Admitting: Internal Medicine

## 2021-06-02 VITALS — BP 159/77 | HR 77 | Temp 96.7°F | Resp 20 | Ht 62.0 in | Wt 162.0 lb

## 2021-06-02 DIAGNOSIS — Z902 Acquired absence of lung [part of]: Secondary | ICD-10-CM | POA: Diagnosis not present

## 2021-06-02 DIAGNOSIS — C3412 Malignant neoplasm of upper lobe, left bronchus or lung: Secondary | ICD-10-CM | POA: Diagnosis not present

## 2021-06-02 DIAGNOSIS — Z85118 Personal history of other malignant neoplasm of bronchus and lung: Secondary | ICD-10-CM | POA: Diagnosis not present

## 2021-06-02 DIAGNOSIS — C3492 Malignant neoplasm of unspecified part of left bronchus or lung: Secondary | ICD-10-CM | POA: Diagnosis not present

## 2021-06-02 DIAGNOSIS — I10 Essential (primary) hypertension: Secondary | ICD-10-CM | POA: Diagnosis not present

## 2021-06-02 DIAGNOSIS — C3490 Malignant neoplasm of unspecified part of unspecified bronchus or lung: Secondary | ICD-10-CM | POA: Diagnosis not present

## 2021-06-02 NOTE — Progress Notes (Signed)
[facility] [phone]   [phone]  OFFICE PROGRESS NOTE  [name], [name] [address]  DIAGNOSIS: Stage IB (T2a, N0, M0) non-small cell lung cancer, adenocarcinoma presented with left upper lobe nodule diagnosed in September 2016.  PRIOR THERAPY: Status post left VATS with left upper lobectomy and mediastinal lymph node dissection on 07/04/2015 under the care of Dr. Roxan Hockey.  CURRENT THERAPY: Observation.  INTERVAL HISTORY: [name] 80 y.o. female returns to the clinic today for annual follow-up visit.  The patient is feeling fine today with no concerning complaints.  She denied having any current chest pain, shortness of breath, cough or hemoptysis.  She has no nausea, vomiting, diarrhea or constipation.  She has no headache or visual changes.  She has no weight loss or night sweats.  She had repeat CT scan of the chest performed recently and she is here for evaluation and discussion of her scan results.  MEDICAL HISTORY: Past Medical History:  Diagnosis Date   Arthritis    Asthma    COPD (chronic obstructive pulmonary disease) ([place])    Depression    Diabetes mellitus without complication ([place])    Diverticulitis    GERD (gastroesophageal reflux disease)    Hyperlipidemia    Hypertension 06/15/2016   left lung ca dx'd 2016   Thyroid nodule     ALLERGIES:  has No Known Allergies.  MEDICATIONS:  Current Outpatient Medications  Medication Sig Dispense Refill   albuterol (PROVENTIL) (2.5 MG/3ML) 0.083% nebulizer solution Take 3 mLs (2.5 mg total) by nebulization every 6 (six) hours as needed for wheezing or shortness of breath. 75 mL 12   albuterol (VENTOLIN HFA) 108 (90 Base) MCG/ACT inhaler Inhale 2 puffs into the lungs every 6 (six) hours as needed for wheezing or shortness of breath. 6.7 g 2   aspirin 81 MG tablet Take 81 mg by mouth every evening.      Blood Glucose Monitoring  Suppl (TRUE METRIX AIR GLUCOSE METER) w/Device KIT Use to check blood sugar 4 times a day. Dx code e11.65 1 kit 2   Calcium Carbonate-Vitamin D 600-400 MG-UNIT tablet Take 1 tablet by mouth daily. (Patient taking differently: Take 1 tablet by mouth daily. 600 mg of calcium and 500 units of vitd)     cyclobenzaprine (FLEXERIL) 5 MG tablet Take 1 tablet (5 mg total) by mouth 3 (three) times daily as needed for muscle spasms. (Patient not taking: Reported on 04/21/2021) 20 tablet 0   cycloSPORINE (RESTASIS) 0.05 % ophthalmic emulsion 1 drop 2 (two) times daily.     donepezil (ARICEPT) 10 MG tablet TAKE 1 TABLET BY MOUTH EVERY DAY IN THE EVENING 90 tablet 1   ezetimibe (ZETIA) 10 MG tablet TAKE 1 TABLET BY MOUTH EVERYDAY AT BEDTIME 90 tablet 1   flunisolide (NASALIDE) 25 MCG/ACT (0.025%) SOLN PLACE 2 SPRAYS INTO THE NOSE 2 (TWO) TIMES DAILY. 25 mL 1   glucose blood (TRUE METRIX BLOOD GLUCOSE TEST) test strip Use as instructed to check blood sugar twice daily dx code e11.9 100 each 12   hydrocortisone cream 1 % Apply to affected area 2 times daily 30 g 5   KLOR-CON M20 20 MEQ tablet TAKE 1 TABLET BY MOUTH EVERY DAY WITH FOOD 90 tablet 1   magnesium oxide (MAG-OX) 400 MG tablet Take 400 mg by mouth daily.     Melatonin 2.5 MG CAPS Take 2.5 mg by mouth at  bedtime as needed (sleep).      mirtazapine (REMERON) 15 MG tablet TAKE 1 TABLET BY MOUTH EVERYDAY AT BEDTIME 90 tablet 1   Multiple Vitamin (MULTIVITAMIN) tablet Take 1 tablet by mouth daily.     niacin (NIASPAN) 500 MG CR tablet TAKE 1 TABLET BY MOUTH EVERY DAY IN THE EVENING 90 tablet 3   OVER THE COUNTER MEDICATION Take 1 tablet by mouth daily.     pantoprazole (PROTONIX) 40 MG tablet TAKE 1 TABLET BY MOUTH TWICE A DAY 180 tablet 1   predniSONE (DELTASONE) 10 MG tablet Take 2 tabs daily with food x 5ds, then 1 tab daily with food x 5ds then STOP (Patient not taking: Reported on 04/21/2021) 15 tablet 0   Probiotic Product (PROBIOTIC PO) Take by mouth.      TRELEGY ELLIPTA 100-62.5-25 MCG/INH AEPB INHALE 1 PUFF ONE TIME DAILY 180 each 0   TRUEplus Lancets 33G MISC Use to test blood sugar twice daily 100 each 11   valACYclovir (VALTREX) 500 MG tablet TAKE 1 TABLET BY MOUTH EVERY DAY 90 tablet 0   White Petrolatum-Mineral Oil (SYSTANE NIGHTTIME OP) Place 1 drop into both eyes at bedtime as needed (for dry eyes).      No current facility-administered medications for this visit.    SURGICAL HISTORY:  Past Surgical History:  Procedure Laterality Date   BIOPSY THYROID     CATARACT EXTRACTION     left eye   CATARACT EXTRACTION W/ INTRAOCULAR LENS IMPLANT     RIGHT   CESAREAN SECTION     EYE SURGERY     MASS EXCISION N/A 12/13/2014   Procedure: EXCISION OF VOCAL CORD POLYPS;  Surgeon: Jerrell Belfast, MD;  Location: [facility];  Service: ENT;  Laterality: N/A;   MICROLARYNGOSCOPY N/A 12/13/2014   Procedure: MICROLARYNGOSCOPY;  Surgeon: Jerrell Belfast, MD;  Location: [facility];  Service: ENT;  Laterality: N/A;   TUBAL LIGATION     VIDEO ASSISTED THORACOSCOPY (VATS)/ LOBECTOMY Left 07/04/2015   Procedure: VIDEO ASSISTED THORACOSCOPY (VATS) LEFT UPPER LOBECTOMY;  Surgeon: Melrose Nakayama, MD;  Location: [facility];  Service: Thoracic;  Laterality: Left;   VIDEO BRONCHOSCOPY WITH INSERTION OF INTERBRONCHIAL VALVE (IBV) N/A 07/11/2015   Procedure: VIDEO BRONCHOSCOPY WITH INSERTION OF INTERBRONCHIAL VALVE (IBV);  Surgeon: Melrose Nakayama, MD;  Location: [facility];  Service: Thoracic;  Laterality: N/A;   VIDEO BRONCHOSCOPY WITH INSERTION OF INTERBRONCHIAL VALVE (IBV) N/A 09/10/2015   Procedure: VIDEO BRONCHOSCOPY WITH REMOVAL OF INTERBRONCHIAL VALVES;  Surgeon: Melrose Nakayama, MD;  Location: [facility];  Service: Thoracic;  Laterality: N/A;    REVIEW OF SYSTEMS:  A comprehensive review of systems was negative.   PHYSICAL EXAMINATION: General appearance: alert, cooperative and no distress Head: Normocephalic, without obvious abnormality, atraumatic Neck: no  adenopathy, no JVD, supple, symmetrical, trachea midline and thyroid not enlarged, symmetric, no tenderness/mass/nodules Lymph nodes: Cervical, supraclavicular, and axillary nodes normal. Resp: clear to auscultation bilaterally Back: symmetric, no curvature. ROM normal. No CVA tenderness. Cardio: regular rate and rhythm, S1, S2 normal, no murmur, click, rub or gallop GI: soft, non-tender; bowel sounds normal; no masses,  no organomegaly Extremities: extremities normal, atraumatic, no cyanosis or edema  ECOG PERFORMANCE STATUS: 1 - Symptomatic but completely ambulatory  Blood pressure (!) 159/77, pulse 77, temperature (!) 96.7 F (35.9 C), temperature source Tympanic, resp. rate 20, height _0  (1.575 m), weight 162 lb (73.5 kg), SpO2 91 %.  LABORATORY DATA: Lab Results  Component Value Date  WBC 7.4 05/26/2021   HGB 15.3 (H) 05/26/2021   HCT 46.3 (H) 05/26/2021   MCV 96.1 05/26/2021   PLT 216 05/26/2021      Chemistry      Component Value Date/Time   NA 141 05/26/2021 1130   NA 143 04/21/2021 1658   NA 142 06/20/2017 1502   K 3.9 05/26/2021 1130   K 4.0 06/20/2017 1502   CL 101 05/26/2021 1130   CO2 29 05/26/2021 1130   CO2 29 06/20/2017 1502   BUN 14 05/26/2021 1130   BUN 15 04/21/2021 1658   BUN 16.0 06/20/2017 1502   CREATININE 1.23 (H) 05/26/2021 1130   CREATININE 1.2 (H) 06/20/2017 1502   GLU 108 12/08/2017 0000      Component Value Date/Time   CALCIUM 10.6 (H) 05/26/2021 1130   CALCIUM 9.8 06/20/2017 1502   ALKPHOS 85 05/26/2021 1130   ALKPHOS 74 06/20/2017 1502   AST 32 05/26/2021 1130   AST 35 (H) 06/20/2017 1502   ALT 24 05/26/2021 1130   ALT 30 06/20/2017 1502   BILITOT 0.6 05/26/2021 1130   BILITOT 0.94 06/20/2017 1502       RADIOGRAPHIC STUDIES: CT Chest W Contrast  Result Date: 05/27/2021 CLINICAL DATA:  An 80 year old female with non-small cell lung cancer presents for staging evaluation. Post LEFT upper lobectomy. EXAM: CT CHEST WITH  CONTRAST TECHNIQUE: Multidetector CT imaging of the chest was performed during intravenous contrast administration. CONTRAST:  60m OMNIPAQUE IOHEXOL 350 MG/ML SOLN COMPARISON:  May 26, 2020. FINDINGS: Cardiovascular: Calcified and noncalcified atheromatous plaque of the thoracic aorta without aneurysmal dilation. Stable caliber and contour. Stable heart size without substantial pericardial effusion. Calcified coronary artery disease. Normal venous phase appearance of central pulmonary vessels. Distortion of LEFT hilum following LEFT upper lobectomy. Mediastinum/Nodes: Enlarged in heterogeneous RIGHT thyroid lobe previously evaluated with ultrasound. No adenopathy in the chest.  Esophagus grossly normal. Lungs/Pleura: Signs of pulmonary emphysema. Post LEFT upper lobectomy. No new suspicious pulmonary nodule. No consolidation. No pleural effusion. Airways are patent. Upper Abdomen: Hepatic steatosis. No acute upper abdominal finding. Imaged portions of liver, gallbladder, pancreas, spleen and adrenal glands otherwise unremarkable. No acute gastrointestinal process to the extent evaluated on very limited assessment. Musculoskeletal: Spinal degenerative changes. No acute or destructive bone finding. IMPRESSION: Post LEFT upper lobectomy. No evidence of recurrent or metastatic disease in the chest. Hepatic steatosis. Heterogeneous and enlarged thyroid on the RIGHT, previously evaluated with ultrasound. Aortic Atherosclerosis (ICD10-I70.0) and Emphysema (ICD10-J43.9). Electronically Signed   By: GZetta BillsM.D.   On: 05/27/2021 10:52     ASSESSMENT AND PLAN:  This is a very pleasant 80years old African-American female with stage IB non-small cell lung cancer status post right upper lobectomy with lymph node dissection in September 2016. The patient has been in observation since that time and she is feeling fine today with no concerning complaints. She had repeat CT scan of the chest performed recently.   I personally and independently reviewed the scan and discussed the results with the patient today. Her scan showed no concerning findings for disease recurrence or metastasis. I recommended for her to continue on observation with repeat CT scan of the chest in 1 year. For the hypertension she was advised to monitor her blood pressure closely at home and to discuss with her primary care provider for adjustment of her medication if needed. The patient was advised to call immediately if she has any other concerning symptoms in the interval. The patient  voices understanding of current disease status and treatment options and is in agreement with the current care plan. All questions were answered. The patient knows to call the clinic with any problems, questions or concerns. We can certainly see the patient much sooner if necessary.   Disclaimer: This note was dictated with voice recognition software. Similar sounding words can inadvertently be transcribed and may not be corrected upon review.

## 2021-06-30 ENCOUNTER — Other Ambulatory Visit: Payer: Self-pay | Admitting: Pulmonary Disease

## 2021-06-30 DIAGNOSIS — J441 Chronic obstructive pulmonary disease with (acute) exacerbation: Secondary | ICD-10-CM

## 2021-07-05 ENCOUNTER — Other Ambulatory Visit: Payer: Self-pay | Admitting: Nurse Practitioner

## 2021-07-06 ENCOUNTER — Encounter: Payer: Medicare HMO | Admitting: Nurse Practitioner

## 2021-07-29 ENCOUNTER — Other Ambulatory Visit: Payer: Self-pay | Admitting: Nurse Practitioner

## 2021-07-29 DIAGNOSIS — Z1231 Encounter for screening mammogram for malignant neoplasm of breast: Secondary | ICD-10-CM | POA: Diagnosis not present

## 2021-08-04 ENCOUNTER — Encounter: Payer: Self-pay | Admitting: Nurse Practitioner

## 2021-08-04 LAB — HM MAMMOGRAPHY
HM Mammogram: NORMAL (ref 0–4)
HM Mammogram: NORMAL (ref 0–4)

## 2021-08-06 ENCOUNTER — Other Ambulatory Visit: Payer: Self-pay | Admitting: Nurse Practitioner

## 2021-08-17 ENCOUNTER — Other Ambulatory Visit: Payer: Self-pay

## 2021-08-17 ENCOUNTER — Encounter: Payer: Self-pay | Admitting: Pulmonary Disease

## 2021-08-17 ENCOUNTER — Ambulatory Visit (INDEPENDENT_AMBULATORY_CARE_PROVIDER_SITE_OTHER): Payer: Medicare HMO | Admitting: Pulmonary Disease

## 2021-08-17 VITALS — BP 122/74 | HR 74 | Temp 98.3°F | Ht 62.0 in | Wt 162.2 lb

## 2021-08-17 DIAGNOSIS — J441 Chronic obstructive pulmonary disease with (acute) exacerbation: Secondary | ICD-10-CM

## 2021-08-17 DIAGNOSIS — Z23 Encounter for immunization: Secondary | ICD-10-CM

## 2021-08-17 DIAGNOSIS — R0609 Other forms of dyspnea: Secondary | ICD-10-CM

## 2021-08-17 MED ORDER — ALBUTEROL SULFATE HFA 108 (90 BASE) MCG/ACT IN AERS: 2.0000 | INHALATION_SPRAY | Freq: Four times a day (QID) | RESPIRATORY_TRACT | 2 refills | Status: DC | PRN

## 2021-08-17 NOTE — Progress Notes (Signed)
   Subjective:    Patient ID: [name], female    DOB: [birth year], 80 y.o.   MRN: [id number]  HPI   80 yo ex-smoker for follow-up of COPD and lung cancer She smoked about 60 pack years. She worked in a [employer] for 20 years   06/2015 underwent left VATS with left upper lobectomy and mediastinal lymph node dissection. Path  showed invasive adenocarcinoma,  adenocarcinoma involving the visceral pleura in addition to lymphovascular invasion. The resection margins as well as the dissected lymph nodes were negative for malignancy. She had persistent air leak postop and required endobronchial valves for resolution   65-month follow-up visit today She complains of persistent shortness of breath.  Takes Mucinex, and cough productive of clear sputum. We reviewed CT chest 05/2019 due to his did not show any evidence of recurrent malignancy She has to stop to rest after walking a few yards She would like dmv recertification filled out for handicap parking  Compliant with Trelegy, uses albuterol on occasion  Significant tests/ events reviewed  05/2019 CT chest >> stable areas of scarring left upper lobe postsurgical, right lower lobe nodular scarring 12/2017 CT chest >> stable, nodular area of left lung scarring 2016 Pre-op PFT - FEV1 73 %, DLCO 48%   03/2016 FEV1 66%   Spirometry 10/2017 shows an FEV1 at 63% ratio 57 and FVC of 86%.   Review of Systems neg for any significant sore throat, dysphagia, itching, sneezing, nasal congestion or excess/ purulent secretions, fever, chills, sweats, unintended wt loss, pleuritic or exertional cp, hempoptysis, orthopnea pnd or change in chronic leg swelling. Also denies presyncope, palpitations, heartburn, abdominal pain, nausea, vomiting, diarrhea or change in bowel or urinary habits, dysuria,hematuria, rash, arthralgias, visual complaints, headache, numbness weakness or ataxia.     Objective:   Physical Exam  Gen. Pleasant,  elderly,well-nourished, in no distress ENT - no thrush, no pallor/icterus,no post nasal drip Neck: No JVD, no thyromegaly, no carotid bruits Lungs: no use of accessory muscles, no dullness to percussion, decreased without rales or rhonchi  Cardiovascular: Rhythm regular, heart sounds  normal, no murmurs or gallops, no peripheral edema Musculoskeletal: No deformities, no cyanosis or clubbing        Assessment & Plan:

## 2021-08-17 NOTE — Patient Instructions (Addendum)
X Prednisone 10 mg tabs  Take 2 tabs daily with food x 5ds, then 1 tab daily with food x 5ds then STOP  Mucinex as needed

## 2021-08-17 NOTE — Assessment & Plan Note (Signed)
We will treat as noninfective exacerbation with a short course of prednisone.  Hopefully this will also get her through the holidays. Emphasized secretion clearance with Mucinex  She will continue on Trelegy, refills on albuterol will be provided

## 2021-08-17 NOTE — Assessment & Plan Note (Signed)
Persistent dyspnea in spite of maximal therapy, offered her center-based pulmonary rehab program but she has transportation issues. Filled out DMV handicap recertification

## 2021-08-19 ENCOUNTER — Other Ambulatory Visit: Payer: Self-pay | Admitting: Nurse Practitioner

## 2021-08-19 ENCOUNTER — Telehealth: Payer: Self-pay | Admitting: Pulmonary Disease

## 2021-08-19 MED ORDER — PREDNISONE 10 MG PO TABS: ORAL_TABLET | ORAL | 0 refills | Status: DC

## 2021-08-19 NOTE — Telephone Encounter (Signed)
I have sent rx for prednisone in Spoke with the pt and notified this was done  Nothing further needed

## 2021-08-24 ENCOUNTER — Encounter: Payer: Self-pay | Admitting: Nurse Practitioner

## 2021-08-24 ENCOUNTER — Ambulatory Visit (INDEPENDENT_AMBULATORY_CARE_PROVIDER_SITE_OTHER): Payer: Medicare HMO | Admitting: Nurse Practitioner

## 2021-08-24 ENCOUNTER — Other Ambulatory Visit: Payer: Self-pay

## 2021-08-24 VITALS — BP 128/70 | HR 64 | Temp 99.1°F | Ht 62.0 in | Wt 161.2 lb

## 2021-08-24 DIAGNOSIS — Z0000 Encounter for general adult medical examination without abnormal findings: Secondary | ICD-10-CM

## 2021-08-24 DIAGNOSIS — I129 Hypertensive chronic kidney disease with stage 1 through stage 4 chronic kidney disease, or unspecified chronic kidney disease: Secondary | ICD-10-CM

## 2021-08-24 DIAGNOSIS — E782 Mixed hyperlipidemia: Secondary | ICD-10-CM

## 2021-08-24 DIAGNOSIS — E1122 Type 2 diabetes mellitus with diabetic chronic kidney disease: Secondary | ICD-10-CM

## 2021-08-24 DIAGNOSIS — I10 Essential (primary) hypertension: Secondary | ICD-10-CM

## 2021-08-24 DIAGNOSIS — Z79899 Other long term (current) drug therapy: Secondary | ICD-10-CM

## 2021-08-24 DIAGNOSIS — N183 Chronic kidney disease, stage 3 unspecified: Secondary | ICD-10-CM | POA: Diagnosis not present

## 2021-08-24 LAB — POCT URINALYSIS DIPSTICK
Glucose, UA: NEGATIVE
Ketones, UA: NEGATIVE
Leukocytes, UA: NEGATIVE
Nitrite, UA: NEGATIVE
Protein, UA: POSITIVE — AB
Spec Grav, UA: >=1.03 — AB (ref 1.010–1.025)
Urobilinogen, UA: 0.2 E.U./dL
pH, UA: 5.5 (ref 5.0–8.0)

## 2021-08-24 LAB — POCT UA - MICROALBUMIN
Creatinine, POC: 300 mg/dL
Microalbumin Ur, POC: 150 mg/L

## 2021-08-24 NOTE — Progress Notes (Addendum)
[name],[name],acting as a Education administrator for [employer], FNP.,have documented all relevant documentation on the behalf of Shannon Brine, FNP,as directed by  Shannon Brine, FNP while in the presence of [name], [place].  This visit occurred during the SARS-CoV-2 public health emergency.  Safety protocols were in place, including screening questions prior to the visit, additional usage of staff PPE, and extensive cleaning of exam room while observing appropriate contact time as indicated for disinfecting solutions.  Subjective:     Patient ID: [name] , female    DOB: 04-13-1941 , 80 y.o.   MRN: [id number]   Chief Complaint  Patient presents with   Annual Exam    HPI  Patient is here for physical exam.   Diabetes She presents for her follow-up diabetic visit. She has type 2 diabetes mellitus. Her disease course has been stable. Pertinent negatives for hypoglycemia include no dizziness or headaches. Pertinent negatives for diabetes include no fatigue, no polydipsia, no polyphagia and no polyuria. There are no hypoglycemic complications. Risk factors for coronary artery disease include sedentary lifestyle. Current diabetic treatment includes diet. She is following a generally healthy diet. When asked about meal planning, she reported none. She has not had a previous visit with a dietitian. She rarely participates in exercise. (She is not checking her blood sugars) She does not see a podiatrist.Eye exam current: scheduled to see eye doctor in June.  Hyperlipidemia This is a chronic problem. The current episode started more than 1 year ago. The problem is controlled. Recent lipid tests were reviewed and are variable. There are no known factors aggravating her hyperlipidemia. Current antihyperlipidemic treatment includes ezetimibe. There are no compliance problems.  Risk factors for coronary artery disease include diabetes mellitus, dyslipidemia and a sedentary lifestyle.    Past Medical History:   Diagnosis Date   Arthritis    Asthma    COPD (chronic obstructive pulmonary disease) ([place])    Depression    Diabetes mellitus without complication (HCC)    Diverticulitis    GERD (gastroesophageal reflux disease)    Hyperlipidemia    Hypertension 06/15/2016   left lung ca dx'd 2016   Thyroid nodule      Family History  Problem Relation Age of Onset   Renal cancer Brother    Diabetes Mother    Hypertension Mother    Renal Disease Mother    Healthy Father      Current Outpatient Medications:    Calcium Carbonate-Vitamin D 600-400 MG-UNIT tablet, Take 1 tablet by mouth daily. (Patient taking differently: Take 1 tablet by mouth daily. 600 mg of calcium and 500 units of vitd), Disp:  , Rfl:    donepezil (ARICEPT) 10 MG tablet, TAKE 1 TABLET BY MOUTH EVERY DAY IN THE EVENING, Disp: 90 tablet, Rfl: 1   ezetimibe (ZETIA) 10 MG tablet, TAKE 1 TABLET BY MOUTH EVERYDAY AT BEDTIME, Disp: 90 tablet, Rfl: 1   flunisolide (NASALIDE) 25 MCG/ACT (0.025%) SOLN, PLACE 2 SPRAYS INTO THE NOSE 2 (TWO) TIMES DAILY. (Patient not taking: Reported on 09/23/2021), Disp: 25 mL, Rfl: 1   Fluticasone-Umeclidin-Vilant (TRELEGY ELLIPTA) 100-62.5-25 MCG/ACT AEPB, INHALE 1 PUFF ONE TIME DAILY, Disp: 180 each, Rfl: 3   KLOR-CON M20 20 MEQ tablet, TAKE 1 TABLET BY MOUTH EVERY DAY WITH FOOD, Disp: 90 tablet, Rfl: 1   magnesium oxide (MAG-OX) 400 MG tablet, Take 400 mg by mouth daily., Disp: , Rfl:    Melatonin 2.5 MG CAPS, Take 2.5 mg by mouth at bedtime as  needed (sleep). , Disp: , Rfl:    mirtazapine (REMERON) 15 MG tablet, TAKE 1 TABLET BY MOUTH EVERYDAY AT BEDTIME, Disp: 90 tablet, Rfl: 1   Multiple Vitamin (MULTIVITAMIN) tablet, Take 1 tablet by mouth daily., Disp: , Rfl:    niacin (NIASPAN) 500 MG CR tablet, TAKE 1 TABLET BY MOUTH EVERY DAY IN THE EVENING, Disp: 90 tablet, Rfl: 3   OVER THE COUNTER MEDICATION, Take 1 tablet by mouth daily., Disp: , Rfl:    pantoprazole (PROTONIX) 40 MG tablet, TAKE 1 TABLET  BY MOUTH TWICE A DAY, Disp: 180 tablet, Rfl: 1   predniSONE (DELTASONE) 10 MG tablet, 2 x 5 days, 1 x 5 days and then stop (Patient not taking: Reported on 09/23/2021), Disp: 15 tablet, Rfl: 0   Probiotic Product (PROBIOTIC PO), Take by mouth., Disp: , Rfl:    TRUEplus Lancets 33G MISC, Use to test blood sugar twice daily, Disp: 100 each, Rfl: 11   valACYclovir (VALTREX) 500 MG tablet, TAKE 1 TABLET BY MOUTH EVERY DAY, Disp: 90 tablet, Rfl: 0   White Petrolatum-Mineral Oil (SYSTANE NIGHTTIME OP), Place 1 drop into both eyes at bedtime as needed (for dry eyes). , Disp: , Rfl:    albuterol (PROVENTIL) (2.5 MG/3ML) 0.083% nebulizer solution, Take 3 mLs (2.5 mg total) by nebulization every 6 (six) hours as needed for wheezing or shortness of breath., Disp: 75 mL, Rfl: 12   albuterol (VENTOLIN HFA) 108 (90 Base) MCG/ACT inhaler, Inhale 2 puffs into the lungs every 6 (six) hours as needed for wheezing or shortness of breath., Disp: 6.7 g, Rfl: 2   aspirin 81 MG tablet, Take 81 mg by mouth every evening. , Disp: , Rfl:    Blood Glucose Monitoring Suppl (TRUE METRIX AIR GLUCOSE METER) w/Device KIT, Use to check blood sugar 4 times a day. Dx code e11.65, Disp: 1 kit, Rfl: 2   cycloSPORINE (RESTASIS) 0.05 % ophthalmic emulsion, 1 drop 2 (two) times daily., Disp: , Rfl:    glucose blood (TRUE METRIX BLOOD GLUCOSE TEST) test strip, CHECK BLOOD SUGAR TWO TIMES DAILY, Disp: 200 strip, Rfl: 2   hydrocortisone cream 1 %, Apply to affected area 2 times daily, Disp: 30 g, Rfl: 5   No Known Allergies    The patient states she is post menopausal status.   No LMP recorded. Patient is postmenopausal.. No abnormal vaginal bleeding. Negative for: breast discharge, breast lump(s), breast pain and breast self exam. Associated symptoms include abnormal vaginal bleeding. Pertinent negatives include abnormal bleeding (hematology), anxiety, decreased libido, depression, difficulty falling sleep, dyspareunia, history of  infertility, nocturia, sexual dysfunction, sleep disturbances, urinary incontinence, urinary urgency, vaginal discharge and vaginal itching. Diet regular. The patient states her exercise level is minimal - she will use the foot pedal 3-4 times a week.   The patient's tobacco use is:  Social History   Tobacco Use  Smoking Status Former   Packs/day: 1.00   Years: 56.00   Pack years: 56.00   Types: Cigarettes   Quit date: 06/22/2015   Years since quitting: 6.2  Smokeless Tobacco Never   She has been exposed to passive smoke. The patient's alcohol use is:  Social History   Substance and Sexual Activity  Alcohol Use No   Alcohol/week: 0.0 standard drinks     Review of Systems  Constitutional: Negative.  Negative for fatigue.  HENT: Negative.    Eyes: Negative.   Respiratory: Negative.    Cardiovascular: Negative.   Gastrointestinal: Negative.  Endocrine: Negative.  Negative for polydipsia, polyphagia and polyuria.  Genitourinary: Negative.   Musculoskeletal: Negative.   Skin: Negative.   Allergic/Immunologic: Negative.   Neurological: Negative.  Negative for dizziness and headaches.  Hematological: Negative.   Psychiatric/Behavioral: Negative.      Today's Vitals   08/24/21 1136  BP: 128/70  Pulse: 64  Temp: 99.1 F (37.3 C)  TempSrc: Oral  SpO2: 93%  Weight: 161 lb 3.2 oz (73.1 kg)  Height: _0  (1.575 m)   Body mass index is 29.48 kg/m.  Wt Readings from Last 3 Encounters:  09/23/21 161 lb 3.2 oz (73.1 kg)  08/24/21 161 lb 3.2 oz (73.1 kg)  08/17/21 162 lb 3.2 oz (73.6 kg)    Objective:  Physical Exam Vitals reviewed.  Constitutional:      General: She is not in acute distress.    Appearance: Normal appearance. She is well-developed.  HENT:     Head: Normocephalic and atraumatic.     Right Ear: Hearing, tympanic membrane, ear canal and external ear normal. There is no impacted cerumen.     Left Ear: Hearing, tympanic membrane, ear canal and external  ear normal. There is no impacted cerumen.     Nose:     Comments: Deferred - masked    Mouth/Throat:     Comments: Deferred - masked Eyes:     General: Lids are normal.     Extraocular Movements: Extraocular movements intact.     Conjunctiva/sclera: Conjunctivae normal.     Pupils: Pupils are equal, round, and reactive to light.     Funduscopic exam:    Right eye: No papilledema.        Left eye: No papilledema.  Neck:     Thyroid: No thyroid mass.     Vascular: No carotid bruit.  Cardiovascular:     Rate and Rhythm: Normal rate and regular rhythm.     Pulses: Normal pulses.     Heart sounds: Normal heart sounds. No murmur heard. Pulmonary:     Effort: Respiratory distress (with exertion, recovers well) present.     Breath sounds: Normal breath sounds. No wheezing or rhonchi.  Chest:     Chest wall: No mass.  Breasts:    Tanner Score is 5.     Right: Normal. No mass or tenderness.     Left: Normal. No mass or tenderness.  Abdominal:     General: Abdomen is flat. Bowel sounds are normal. There is no distension.     Palpations: Abdomen is soft.     Tenderness: There is no abdominal tenderness.  Musculoskeletal:        General: No swelling or tenderness. Normal range of motion.     Cervical back: Full passive range of motion without pain, normal range of motion and neck supple.     Right lower leg: No edema.     Left lower leg: No edema.  Lymphadenopathy:     Upper Body:     Right upper body: No supraclavicular, axillary or pectoral adenopathy.     Left upper body: No supraclavicular, axillary or pectoral adenopathy.  Skin:    General: Skin is warm and dry.     Capillary Refill: Capillary refill takes less than 2 seconds.  Neurological:     General: No focal deficit present.     Mental Status: She is alert and oriented to person, place, and time.     Cranial Nerves: No cranial nerve deficit.  Sensory: No sensory deficit.     Motor: No weakness.  Psychiatric:         Mood and Affect: Mood normal.        Behavior: Behavior normal.        Thought Content: Thought content normal.        Judgment: Judgment normal.        Assessment And Plan:     1. Encounter for health maintenance examination Behavior modifications discussed and diet history reviewed.   Pt will continue to exercise regularly and modify diet with low GI, plant based foods and decrease intake of processed foods.  Recommend intake of daily multivitamin, Vitamin D, and calcium.  Recommend mammogram and colonoscopy (both no longer required due to age) for preventive screenings, as well as recommend immunizations that include influenza, TDAP, and Shingles (up to date)  2. Diabetes mellitus with stage 3 chronic kidney disease (HCC) Comments: Stable, continue current medications - Hemoglobin A1c  3. Hypertensive Nephropathy Comments: Blood pressure is well controlled, continue current medications  - POCT Urinalysis Dipstick (81002) - POCT UA - Microalbumin - EKG 12-Lead  4. Mixed hyperlipidemia Comments: Stable, tolerating statin well.  - CMP14+EGFR - Lipid panel  5. Other long term (current) drug therapy - CBC     Patient was given opportunity to ask questions. Patient verbalized understanding of the plan and was able to repeat key elements of the plan. All questions were answered to their satisfaction.   Shannon Brine, FNP   [name], Shannon Brine, FNP, have reviewed all documentation for this visit. The documentation on 08/24/2021 for the exam, diagnosis, procedures, and orders are all accurate and complete.   THE PATIENT IS ENCOURAGED TO PRACTICE SOCIAL DISTANCING DUE TO THE COVID-19 PANDEMIC.

## 2021-08-24 NOTE — Patient Instructions (Signed)

## 2021-08-25 LAB — CMP14+EGFR
ALT: 23 IU/L (ref 0–32)
AST: 27 IU/L (ref 0–40)
Albumin/Globulin Ratio: 1.5 (ref 1.2–2.2)
Albumin: 4.4 g/dL (ref 3.7–4.7)
Alkaline Phosphatase: 90 IU/L (ref 44–121)
BUN/Creatinine Ratio: 10 — ABNORMAL LOW (ref 12–28)
BUN: 12 mg/dL (ref 8–27)
Bilirubin Total: 1 mg/dL (ref 0.0–1.2)
CO2: 27 mmol/L (ref 20–29)
Calcium: 10.1 mg/dL (ref 8.7–10.3)
Chloride: 99 mmol/L (ref 96–106)
Creatinine, Ser: 1.15 mg/dL — ABNORMAL HIGH (ref 0.57–1.00)
Globulin, Total: 3 g/dL (ref 1.5–4.5)
Glucose: 138 mg/dL — ABNORMAL HIGH (ref 70–99)
Potassium: 3.7 mmol/L (ref 3.5–5.2)
Sodium: 140 mmol/L (ref 134–144)
Total Protein: 7.4 g/dL (ref 6.0–8.5)
eGFR: 48 mL/min/{1.73_m2} — ABNORMAL LOW (ref >59)

## 2021-08-25 LAB — LIPID PANEL
Chol/HDL Ratio: 4.7 ratio — ABNORMAL HIGH (ref 0.0–4.4)
Cholesterol, Total: 220 mg/dL — ABNORMAL HIGH (ref 100–199)
HDL: 47 mg/dL (ref >39)
LDL Chol Calc (NIH): 148 mg/dL — ABNORMAL HIGH (ref 0–99)
Triglycerides: 138 mg/dL (ref 0–149)
VLDL Cholesterol Cal: 25 mg/dL (ref 5–40)

## 2021-08-25 LAB — CBC
Hematocrit: 44.8 % (ref 34.0–46.6)
Hemoglobin: 15.2 g/dL (ref 11.1–15.9)
MCH: 31.9 pg (ref 26.6–33.0)
MCHC: 33.9 g/dL (ref 31.5–35.7)
MCV: 94 fL (ref 79–97)
Platelets: 208 10*3/uL (ref 150–450)
RBC: 4.77 x10E6/uL (ref 3.77–5.28)
RDW: 12.9 % (ref 11.7–15.4)
WBC: 7.3 10*3/uL (ref 3.4–10.8)

## 2021-08-25 LAB — HEMOGLOBIN A1C
Est. average glucose Bld gHb Est-mCnc: 143 mg/dL
Hgb A1c MFr Bld: 6.6 % — ABNORMAL HIGH (ref 4.8–5.6)

## 2021-09-09 ENCOUNTER — Other Ambulatory Visit: Payer: Self-pay | Admitting: Nurse Practitioner

## 2021-09-10 ENCOUNTER — Encounter: Payer: Medicare HMO | Admitting: Nurse Practitioner

## 2021-09-23 ENCOUNTER — Ambulatory Visit (INDEPENDENT_AMBULATORY_CARE_PROVIDER_SITE_OTHER): Payer: Medicare HMO

## 2021-09-23 ENCOUNTER — Other Ambulatory Visit: Payer: Self-pay

## 2021-09-23 VITALS — BP 118/70 | HR 102 | Temp 98.4°F | Ht 62.2 in | Wt 161.2 lb

## 2021-09-23 DIAGNOSIS — Z0000 Encounter for general adult medical examination without abnormal findings: Secondary | ICD-10-CM

## 2021-09-23 NOTE — Progress Notes (Signed)
This visit occurred during the SARS-CoV-2 public health emergency.  Safety protocols were in place, including screening questions prior to the visit, additional usage of staff PPE, and extensive cleaning of exam room while observing appropriate contact time as indicated for disinfecting solutions.  Subjective:   [name] is a 81 y.o. female who presents for Medicare Annual (Subsequent) preventive examination.  Review of Systems     Cardiac Risk Factors include: advanced age (>76men, >68 women);hypertension     Objective:    Today's Vitals   09/23/21 1107 09/23/21 1112  BP: 118/70   Pulse: (!) 102   Temp: 98.4 F (36.9 C)   TempSrc: Oral   SpO2: 94%   Weight: 161 lb 3.2 oz (73.1 kg)   Height: 5' 2.2" (1.58 m)   PainSc:  7    Body mass index is 29.29 kg/m.  Advanced Directives 09/23/2021 03/04/2021 02/05/2021 05/28/2020 12/27/2019 05/03/2019 02/21/2019  Does Patient Have a Medical Advance Directive? No No No No No No No  Type of Advance Directive - - - - - - -  Does patient want to make changes to medical advance directive? - - - - - - -  Would patient like information on creating a medical advance directive? No - Patient declined No - Patient declined No - Patient declined No - Patient declined No - Guardian declined - No - Patient declined    Current Medications (verified) Outpatient Encounter Medications as of 09/23/2021  Medication Sig   albuterol (PROVENTIL) (2.5 MG/3ML) 0.083% nebulizer solution Take 3 mLs (2.5 mg total) by nebulization every 6 (six) hours as needed for wheezing or shortness of breath.   albuterol (VENTOLIN HFA) 108 (90 Base) MCG/ACT inhaler Inhale 2 puffs into the lungs every 6 (six) hours as needed for wheezing or shortness of breath.   aspirin 81 MG tablet Take 81 mg by mouth every evening.    Blood Glucose Monitoring Suppl (TRUE METRIX AIR GLUCOSE METER) w/Device KIT Use to check blood sugar 4 times a day. Dx code e11.65   Calcium Carbonate-Vitamin D  600-400 MG-UNIT tablet Take 1 tablet by mouth daily. (Patient taking differently: Take 1 tablet by mouth daily. 600 mg of calcium and 500 units of vitd)   cycloSPORINE (RESTASIS) 0.05 % ophthalmic emulsion 1 drop 2 (two) times daily.   donepezil (ARICEPT) 10 MG tablet TAKE 1 TABLET BY MOUTH EVERY DAY IN THE EVENING   ezetimibe (ZETIA) 10 MG tablet TAKE 1 TABLET BY MOUTH EVERYDAY AT BEDTIME   Fluticasone-Umeclidin-Vilant (TRELEGY ELLIPTA) 100-62.5-25 MCG/ACT AEPB INHALE 1 PUFF ONE TIME DAILY   glucose blood (TRUE METRIX BLOOD GLUCOSE TEST) test strip CHECK BLOOD SUGAR TWO TIMES DAILY   hydrocortisone cream 1 % Apply to affected area 2 times daily   KLOR-CON M20 20 MEQ tablet TAKE 1 TABLET BY MOUTH EVERY DAY WITH FOOD   magnesium oxide (MAG-OX) 400 MG tablet Take 400 mg by mouth daily.   Melatonin 2.5 MG CAPS Take 2.5 mg by mouth at bedtime as needed (sleep).    mirtazapine (REMERON) 15 MG tablet TAKE 1 TABLET BY MOUTH EVERYDAY AT BEDTIME   Multiple Vitamin (MULTIVITAMIN) tablet Take 1 tablet by mouth daily.   niacin (NIASPAN) 500 MG CR tablet TAKE 1 TABLET BY MOUTH EVERY DAY IN THE EVENING   OVER THE COUNTER MEDICATION Take 1 tablet by mouth daily.   pantoprazole (PROTONIX) 40 MG tablet TAKE 1 TABLET BY MOUTH TWICE A DAY   Probiotic Product (PROBIOTIC PO) Take  by mouth.   TRUEplus Lancets 33G MISC Use to test blood sugar twice daily   valACYclovir (VALTREX) 500 MG tablet TAKE 1 TABLET BY MOUTH EVERY DAY   White Petrolatum-Mineral Oil (SYSTANE NIGHTTIME OP) Place 1 drop into both eyes at bedtime as needed (for dry eyes).    flunisolide (NASALIDE) 25 MCG/ACT (0.025%) SOLN PLACE 2 SPRAYS INTO THE NOSE 2 (TWO) TIMES DAILY. (Patient not taking: Reported on 09/23/2021)   predniSONE (DELTASONE) 10 MG tablet 2 x 5 days, 1 x 5 days and then stop (Patient not taking: Reported on 09/23/2021)   No facility-administered encounter medications on file as of 09/23/2021.    Allergies (verified) Patient has no  known allergies.   History: Past Medical History:  Diagnosis Date   Arthritis    Asthma    COPD (chronic obstructive pulmonary disease) ([place])    Depression    Diabetes mellitus without complication ([place])    Diverticulitis    GERD (gastroesophageal reflux disease)    Hyperlipidemia    Hypertension 06/15/2016   left lung ca dx'd 2016   Thyroid nodule    Past Surgical History:  Procedure Laterality Date   BIOPSY THYROID     CATARACT EXTRACTION     left eye   CATARACT EXTRACTION W/ INTRAOCULAR LENS IMPLANT     RIGHT   CESAREAN SECTION     EYE SURGERY     MASS EXCISION N/A 12/13/2014   Procedure: EXCISION OF VOCAL CORD POLYPS;  Surgeon: Jerrell Belfast, MD;  Location: [place];  Service: ENT;  Laterality: N/A;   MICROLARYNGOSCOPY N/A 12/13/2014   Procedure: MICROLARYNGOSCOPY;  Surgeon: Jerrell Belfast, MD;  Location: [place];  Service: ENT;  Laterality: N/A;   TUBAL LIGATION     VIDEO ASSISTED THORACOSCOPY (VATS)/ LOBECTOMY Left 07/04/2015   Procedure: VIDEO ASSISTED THORACOSCOPY (VATS) LEFT UPPER LOBECTOMY;  Surgeon: Melrose Nakayama, MD;  Location: [place];  Service: Thoracic;  Laterality: Left;   VIDEO BRONCHOSCOPY WITH INSERTION OF INTERBRONCHIAL VALVE (IBV) N/A 07/11/2015   Procedure: VIDEO BRONCHOSCOPY WITH INSERTION OF INTERBRONCHIAL VALVE (IBV);  Surgeon: Melrose Nakayama, MD;  Location: [place];  Service: Thoracic;  Laterality: N/A;   VIDEO BRONCHOSCOPY WITH INSERTION OF INTERBRONCHIAL VALVE (IBV) N/A 09/10/2015   Procedure: VIDEO BRONCHOSCOPY WITH REMOVAL OF INTERBRONCHIAL VALVES;  Surgeon: Melrose Nakayama, MD;  Location: [place];  Service: Thoracic;  Laterality: N/A;   Family History  Problem Relation Age of Onset   Renal cancer Brother    Diabetes Mother    Hypertension Mother    Renal Disease Mother    Healthy Father    Social History   Socioeconomic History   Marital status: Widowed    Spouse name: Not on file   Number of children: 5   Years of education: Not  on file   Highest education level: Not on file  Occupational History   Occupation: retired  Tobacco Use   Smoking status: Former    Packs/day: 1.00    Years: 56.00    Pack years: 56.00    Types: Cigarettes    Quit date: 06/22/2015    Years since quitting: 6.2   Smokeless tobacco: Never  Vaping Use   Vaping Use: Never used  Substance and Sexual Activity   Alcohol use: No    Alcohol/week: 0.0 standard drinks   Drug use: No   Sexual activity: Not Currently  Other Topics Concern   Not on file  Social History Narrative   Not on  file   Social Determinants of Health   Financial Resource Strain: Low Risk    Difficulty of Paying Living Expenses: Not hard at all  Food Insecurity: No Food Insecurity   Worried About Charity fundraiser in the Last Year: Never true   Arboriculturist in the Last Year: Never true  Transportation Needs: No Transportation Needs   Lack of Transportation (Medical): No   Lack of Transportation (Non-Medical): No  Physical Activity: Inactive   Days of Exercise per Week: 0 days   Minutes of Exercise per Session: 0 min  Stress: No Stress Concern Present   Feeling of Stress : Not at all  Social Connections: Not on file    Tobacco Counseling Counseling given: Not Answered   Clinical Intake:  Pre-visit preparation completed: Yes  Pain : 0-10 Pain Score: 7  Pain Type: Chronic pain Pain Location: Hip Pain Orientation: Left Pain Descriptors / Indicators: Aching, Constant Pain Onset: More than a month ago Pain Frequency: Constant     Nutritional Status: BMI 25 -29 Overweight Nutritional Risks: None Diabetes: No  How often do you need to have someone help you when you read instructions, pamphlets, or other written materials from your doctor or pharmacy?: 1 - Never What is the last grade level you completed in school?: 12th grade  Diabetic? no  Interpreter Needed?: No  Information entered by :: NAllen LPN   Activities of Daily Living In  your present state of health, do you have any difficulty performing the following activities: 09/23/2021 02/05/2021  Hearing? Y N  Comment trouble understanding sometimes -  Vision? N N  Difficulty concentrating or making decisions? N N  Walking or climbing stairs? Y N  Comment - -  Dressing or bathing? N N  Doing errands, shopping? N N  Preparing Food and eating ? N N  Using the Toilet? N N  In the past six months, have you accidently leaked urine? N Y  Comment - with coughing  Do you have problems with loss of bowel control? N N  Managing your Medications? N N  Managing your Finances? N N  Housekeeping or managing your Housekeeping? N N  Some recent data might be hidden    Patient Care Team: [name], FNP as PCP - General (General Practice) Monna Fam, MD as Consulting Physician (Ophthalmology) Rigoberto Noel, MD as Consulting Physician (Pulmonary Disease) Melrose Nakayama, MD as Consulting Physician (Cardiothoracic Surgery) Curt Bears, MD as Consulting Physician (Oncology)  Indicate any recent Medical Services you may have received from other than [facility] providers in the past year (date may be approximate).     Assessment:   This is a routine wellness examination for [facility].  Hearing/Vision screen Vision Screening - Comments:: Regular eye exams, Dr. Herbert Deaner  Dietary issues and exercise activities discussed: Current Exercise Habits: The patient does not participate in regular exercise at present   Goals Addressed             This Visit's Progress    Patient Stated       09/23/2021, wants to eat healthier and regularly       Depression Screen PHQ 2/9 Scores 09/23/2021 03/04/2021 02/05/2021 01/01/2021 12/27/2019 12/27/2019 06/28/2019  PHQ - 2 Score 0 0 0 0 0 0 0  PHQ- 9 Score - - - - 2 - -    Fall Risk Fall Risk  09/23/2021 03/04/2021 02/05/2021 01/01/2021 12/27/2019  Falls in the past year? 1 0 0 0  0  Comment misstepped - - - -  Number falls in past yr:  1 - - - -  Injury with Fall? 0 - - - -  Risk for fall due to : Medication side effect - Medication side effect - Medication side effect  Follow up Falls evaluation completed;Education provided;Falls prevention discussed - Falls evaluation completed;Education provided;Falls prevention discussed - Falls evaluation completed;Education provided;Falls prevention discussed    FALL RISK PREVENTION PERTAINING TO THE HOME:  Any stairs in or around the home? Yes  If so, are there any without handrails? No  Home free of loose throw rugs in walkways, pet beds, electrical cords, etc? Yes  Adequate lighting in your home to reduce risk of falls? Yes   ASSISTIVE DEVICES UTILIZED TO PREVENT FALLS:  Life alert? No  Use of a cane, walker or w/c? No  Grab bars in the bathroom? Yes  Shower chair or bench in shower? Yes  Elevated toilet seat or a handicapped toilet? Yes   TIMED UP AND GO:  Was the test performed? No .    Gait slow and steady without use of assistive device  Cognitive Function:     6CIT Screen 09/23/2021 02/05/2021 12/27/2019 05/03/2019 06/14/2018  What Year? 0 points 0 points 0 points 0 points 0 points  What month? 0 points 0 points 0 points 0 points 0 points  What time? 0 points 3 points 3 points 0 points 0 points  Count back from 20 0 points 0 points 0 points 0 points 0 points  Months in reverse 0 points 0 points 0 points 0 points 0 points  Repeat phrase 2 points 6 points 0 points 0 points 0 points  Total Score 2 9 3  0 0    Immunizations Immunization History  Administered Date(s) Administered   Fluad Quad(high Dose 65+) 07/01/2020, 08/17/2021   Influenza, High Dose Seasonal PF 06/06/2016, 05/01/2017, 05/31/2018, 06/28/2019   Influenza, Quadrivalent, Recombinant, Inj, Pf 06/03/2019   Influenza-Unspecified 05/01/2017   Moderna SARS-COV2 Booster Vaccination 09/10/2020   Moderna Sars-Covid-2 Vaccination 10/19/2019, 11/16/2019   Pneumococcal Conjugate-13 07/17/2014, 06/15/2018    Pneumococcal Polysaccharide-23 09/07/2011    TDAP status: Up to date  Flu Vaccine status: Up to date  Pneumococcal vaccine status: Up to date  Covid-19 vaccine status: Completed vaccines  Qualifies for Shingles Vaccine? Yes   Zostavax completed No   Shingrix Completed?: No.    Education has been provided regarding the importance of this vaccine. Patient has been advised to call insurance company to determine out of pocket expense if they have not yet received this vaccine. Advised may also receive vaccine at local pharmacy or Health Dept. Verbalized acceptance and understanding.  Screening Tests Health Maintenance  Topic Date Due   COVID-19 Vaccine (3 - Moderna risk series) 10/08/2020   Zoster Vaccines- Shingrix (1 of 2) 12/22/2021 (Originally 04/12/1960)   DEXA SCAN  07/23/2022   MAMMOGRAM  08/04/2022   URINE MICROALBUMIN  08/24/2022   TETANUS/TDAP  10/30/2022   Pneumonia Vaccine 32+ Years old  Completed   INFLUENZA VACCINE  Completed   HPV VACCINES  Aged Out    Health Maintenance  Health Maintenance Due  Topic Date Due   COVID-19 Vaccine (3 - Moderna risk series) 10/08/2020    Colorectal cancer screening: No longer required.   Mammogram status: Completed 08/04/2021. Repeat every year  Bone Density status: Completed 07/23/2020.   Lung Cancer Screening: (Low Dose CT Chest recommended if Age 68-80 years, 30 pack-year currently smoking OR have  quit w/in 15years.) does not qualify.   Lung Cancer Screening Referral: no  Additional Screening:  Hepatitis C Screening: does not qualify;   Vision Screening: Recommended annual ophthalmology exams for early detection of glaucoma and other disorders of the eye. Is the patient up to date with their annual eye exam?  Yes  Who is the provider or what is the name of the office in which the patient attends annual eye exams? Dr. Herbert Deaner If pt is not established with a provider, would they like to be referred to a provider to establish  care? No .   Dental Screening: Recommended annual dental exams for proper oral hygiene  Community Resource Referral / Chronic Care Management: CRR required this visit?  No   CCM required this visit?  No      Plan:     I have personally reviewed and noted the following in the patients chart:   Medical and social history Use of alcohol, tobacco or illicit drugs  Current medications and supplements including opioid prescriptions.  Functional ability and status Nutritional status Physical activity Advanced directives List of other physicians Hospitalizations, surgeries, and ER visits in previous 12 months Vitals Screenings to include cognitive, depression, and falls Referrals and appointments  In addition, I have reviewed and discussed with patient certain preventive protocols, quality metrics, and best practice recommendations. A written personalized care plan for preventive services as well as general preventive health recommendations were provided to patient.     Kellie Simmering, LPN   [id number]   Nurse Notes: none

## 2021-09-23 NOTE — Patient Instructions (Signed)
Ms. [name] , Thank you for taking time to come for your Medicare Wellness Visit. I appreciate your ongoing commitment to your health goals. Please review the following plan we discussed and let me know if I can assist you in the future.   Screening recommendations/referrals: Colonoscopy: not required Mammogram: completed 08/04/2021 Bone Density: completed 07/23/2020 Recommended yearly ophthalmology/optometry visit for glaucoma screening and checkup Recommended yearly dental visit for hygiene and checkup  Vaccinations: Influenza vaccine: completed 08/17/2021 Pneumococcal vaccine: completed 06/15/2018 Tdap vaccine: completed 10/30/2012, due 10/30/2022 Shingles vaccine: discussed   Covid-19: 09/10/2020, 11/16/2019, 10/19/2019  Advanced directives: Advance directive discussed with you today. Even though you declined this today please call our office should you change your mind and we can give you the proper paperwork for you to fill out.  Conditions/risks identified: none  Next appointment: Follow up in one year for your annual wellness visit    Preventive Care 65 Years and Older, Female Preventive care refers to lifestyle choices and visits with your health care provider that can promote health and wellness. What does preventive care include? A yearly physical exam. This is also called an annual well check. Dental exams once or twice a year. Routine eye exams. Ask your health care provider how often you should have your eyes checked. Personal lifestyle choices, including: Daily care of your teeth and gums. Regular physical activity. Eating a healthy diet. Avoiding tobacco and drug use. Limiting alcohol use. Practicing safe sex. Taking low-dose aspirin every day. Taking vitamin and mineral supplements as recommended by your health care provider. What happens during an annual well check? The services and screenings done by your health care provider during your annual well check will  depend on your age, overall health, lifestyle risk factors, and family history of disease. Counseling  Your health care provider may ask you questions about your: Alcohol use. Tobacco use. Drug use. Emotional well-being. Home and relationship well-being. Sexual activity. Eating habits. History of falls. Memory and ability to understand (cognition). Work and work Statistician. Reproductive health. Screening  You may have the following tests or measurements: Height, weight, and BMI. Blood pressure. Lipid and cholesterol levels. These may be checked every 5 years, or more frequently if you are over 103 years old. Skin check. Lung cancer screening. You may have this screening every year starting at age 4 if you have a 30-pack-year history of smoking and currently smoke or have quit within the past 15 years. Fecal occult blood test (FOBT) of the stool. You may have this test every year starting at age 55. Flexible sigmoidoscopy or colonoscopy. You may have a sigmoidoscopy every 5 years or a colonoscopy every 10 years starting at age 36. Hepatitis C blood test. Hepatitis B blood test. Sexually transmitted disease (STD) testing. Diabetes screening. This is done by checking your blood sugar (glucose) after you have not eaten for a while (fasting). You may have this done every 1-3 years. Bone density scan. This is done to screen for osteoporosis. You may have this done starting at age 76. Mammogram. This may be done every 1-2 years. Talk to your health care provider about how often you should have regular mammograms. Talk with your health care provider about your test results, treatment options, and if necessary, the need for more tests. Vaccines  Your health care provider may recommend certain vaccines, such as: Influenza vaccine. This is recommended every year. Tetanus, diphtheria, and acellular pertussis (Tdap, Td) vaccine. You may need a Td booster every 10 years.  Zoster vaccine. You may  need this after age 49. Pneumococcal 13-valent conjugate (PCV13) vaccine. One dose is recommended after age 50. Pneumococcal polysaccharide (PPSV23) vaccine. One dose is recommended after age 39. Talk to your health care provider about which screenings and vaccines you need and how often you need them. This information is not intended to replace advice given to you by your health care provider. Make sure you discuss any questions you have with your health care provider. Document Released: 09/19/2015 Document Revised: 05/12/2016 Document Reviewed: 06/24/2015 Elsevier Interactive Patient Education  2017 [employer] Prevention in the Home Falls can cause injuries. They can happen to people of all ages. There are many things you can do to make your home safe and to help prevent falls. What can I do on the outside of my home? Regularly fix the edges of walkways and driveways and fix any cracks. Remove anything that might make you trip as you walk through a door, such as a raised step or threshold. Trim any bushes or trees on the path to your home. Use bright outdoor lighting. Clear any walking paths of anything that might make someone trip, such as rocks or tools. Regularly check to see if handrails are loose or broken. Make sure that both sides of any steps have handrails. Any raised decks and porches should have guardrails on the edges. Have any leaves, snow, or ice cleared regularly. Use sand or salt on walking paths during winter. Clean up any spills in your garage right away. This includes oil or grease spills. What can I do in the bathroom? Use night lights. Install grab bars by the toilet and in the tub and shower. Do not use towel bars as grab bars. Use non-skid mats or decals in the tub or shower. If you need to sit down in the shower, use a plastic, non-slip stool. Keep the floor dry. Clean up any water that spills on the floor as soon as it happens. Remove soap buildup in  the tub or shower regularly. Attach bath mats securely with double-sided non-slip rug tape. Do not have throw rugs and other things on the floor that can make you trip. What can I do in the bedroom? Use night lights. Make sure that you have a light by your bed that is easy to reach. Do not use any sheets or blankets that are too big for your bed. They should not hang down onto the floor. Have a firm chair that has side arms. You can use this for support while you get dressed. Do not have throw rugs and other things on the floor that can make you trip. What can I do in the kitchen? Clean up any spills right away. Avoid walking on wet floors. Keep items that you use a lot in easy-to-reach places. If you need to reach something above you, use a strong step stool that has a grab bar. Keep electrical cords out of the way. Do not use floor polish or wax that makes floors slippery. If you must use wax, use non-skid floor wax. Do not have throw rugs and other things on the floor that can make you trip. What can I do with my stairs? Do not leave any items on the stairs. Make sure that there are handrails on both sides of the stairs and use them. Fix handrails that are broken or loose. Make sure that handrails are as long as the stairways. Check any carpeting to make sure that  it is firmly attached to the stairs. Fix any carpet that is loose or worn. Avoid having throw rugs at the top or bottom of the stairs. If you do have throw rugs, attach them to the floor with carpet tape. Make sure that you have a light switch at the top of the stairs and the bottom of the stairs. If you do not have them, ask someone to add them for you. What else can I do to help prevent falls? Wear shoes that: Do not have high heels. Have rubber bottoms. Are comfortable and fit you well. Are closed at the toe. Do not wear sandals. If you use a stepladder: Make sure that it is fully opened. Do not climb a closed  stepladder. Make sure that both sides of the stepladder are locked into place. Ask someone to hold it for you, if possible. Clearly mark and make sure that you can see: Any grab bars or handrails. First and last steps. Where the edge of each step is. Use tools that help you move around (mobility aids) if they are needed. These include: Canes. Walkers. Scooters. Crutches. Turn on the lights when you go into a dark area. Replace any light bulbs as soon as they burn out. Set up your furniture so you have a clear path. Avoid moving your furniture around. If any of your floors are uneven, fix them. If there are any pets around you, be aware of where they are. Review your medicines with your doctor. Some medicines can make you feel dizzy. This can increase your chance of falling. Ask your doctor what other things that you can do to help prevent falls. This information is not intended to replace advice given to you by your health care provider. Make sure you discuss any questions you have with your health care provider. Document Released: 06/19/2009 Document Revised: 01/29/2016 Document Reviewed: 09/27/2014 Elsevier Interactive Patient Education  2017 [employer].

## 2021-10-06 ENCOUNTER — Other Ambulatory Visit: Payer: Self-pay | Admitting: Nurse Practitioner

## 2021-10-06 DIAGNOSIS — G4700 Insomnia, unspecified: Secondary | ICD-10-CM

## 2021-10-26 ENCOUNTER — Other Ambulatory Visit: Payer: Self-pay | Admitting: Internal Medicine

## 2021-11-10 ENCOUNTER — Other Ambulatory Visit: Payer: Self-pay

## 2021-11-10 MED ORDER — VALACYCLOVIR HCL 500 MG PO TABS: 500.0000 mg | ORAL_TABLET | Freq: Every day | ORAL | 0 refills | Status: DC

## 2021-11-11 ENCOUNTER — Other Ambulatory Visit: Payer: Self-pay | Admitting: Nurse Practitioner

## 2021-11-16 ENCOUNTER — Other Ambulatory Visit: Payer: Self-pay | Admitting: Nurse Practitioner

## 2021-11-25 ENCOUNTER — Other Ambulatory Visit: Payer: Self-pay | Admitting: Pulmonary Disease

## 2021-11-25 ENCOUNTER — Other Ambulatory Visit: Payer: Self-pay | Admitting: Nurse Practitioner

## 2021-12-24 ENCOUNTER — Ambulatory Visit: Payer: Medicare HMO | Admitting: Nurse Practitioner

## 2021-12-30 ENCOUNTER — Encounter: Payer: Self-pay | Admitting: Nurse Practitioner

## 2021-12-30 ENCOUNTER — Ambulatory Visit (INDEPENDENT_AMBULATORY_CARE_PROVIDER_SITE_OTHER): Payer: Medicare HMO | Admitting: Nurse Practitioner

## 2021-12-30 VITALS — BP 122/60 | HR 88 | Temp 98.1°F | Resp 95 | Ht 62.2 in | Wt 163.0 lb

## 2021-12-30 DIAGNOSIS — I129 Hypertensive chronic kidney disease with stage 1 through stage 4 chronic kidney disease, or unspecified chronic kidney disease: Secondary | ICD-10-CM | POA: Diagnosis not present

## 2021-12-30 DIAGNOSIS — E1122 Type 2 diabetes mellitus with diabetic chronic kidney disease: Secondary | ICD-10-CM

## 2021-12-30 DIAGNOSIS — J441 Chronic obstructive pulmonary disease with (acute) exacerbation: Secondary | ICD-10-CM | POA: Diagnosis not present

## 2021-12-30 DIAGNOSIS — E782 Mixed hyperlipidemia: Secondary | ICD-10-CM

## 2021-12-30 DIAGNOSIS — I700 Atherosclerosis of aorta: Secondary | ICD-10-CM

## 2021-12-30 DIAGNOSIS — G8929 Other chronic pain: Secondary | ICD-10-CM | POA: Diagnosis not present

## 2021-12-30 DIAGNOSIS — N183 Chronic kidney disease, stage 3 unspecified: Secondary | ICD-10-CM | POA: Diagnosis not present

## 2021-12-30 DIAGNOSIS — M545 Low back pain, unspecified: Secondary | ICD-10-CM

## 2021-12-30 NOTE — Progress Notes (Signed)
?Industrial/product designer as a Education administrator for [employer], FNP.,have documented all relevant documentation on the behalf of [name], FNP,as directed by  [name], FNP while in the presence of [name], [place]. ? ?This visit occurred during the SARS-CoV-2 public health emergency.  Safety protocols were in place, including screening questions prior to the visit, additional usage of staff PPE, and extensive cleaning of exam room while observing appropriate contact time as indicated for disinfecting solutions. ? ?Subjective:  ?  ? Patient ID: [name] , female    DOB: [birth year] , 81 y.o.   MRN: [id number] ? ? ?Chief Complaint  ?Patient presents with  ? Hypertension  ? ? ?HPI ? ?Patient is here for HTN and DM follow up.  ? ?Diabetes ?She presents for her follow-up diabetic visit. She has type 2 diabetes mellitus. Her disease course has been stable. Pertinent negatives for hypoglycemia include no dizziness or headaches. Pertinent negatives for diabetes include no fatigue, no polydipsia, no polyphagia and no polyuria. There are no hypoglycemic complications. There are no diabetic complications. Risk factors for coronary artery disease include sedentary lifestyle. Current diabetic treatment includes diet. She is following a generally healthy diet. When asked about meal planning, she reported none. She has not had a previous visit with a dietitian. She rarely participates in exercise. (Blood sugars are 140-153) She does not see a podiatrist.Eye exam current: scheduled to see eye doctor in June.  ?Hyperlipidemia ?This is a chronic problem. The current episode started more than 1 year ago. The problem is controlled. Recent lipid tests were reviewed and are variable. There are no known factors aggravating her hyperlipidemia. Current antihyperlipidemic treatment includes ezetimibe. There are no compliance problems.  Risk factors for coronary artery disease include diabetes mellitus, dyslipidemia and a sedentary  lifestyle.   ? ?Past Medical History:  ?Diagnosis Date  ? Arthritis   ? Asthma   ? COPD (chronic obstructive pulmonary disease) ([place])   ? Depression   ? Diabetes mellitus without complication ([place])   ? Diverticulitis   ? GERD (gastroesophageal reflux disease)   ? Hyperlipidemia   ? Hypertension 06/15/2016  ? left lung ca dx'd 2016  ? Thyroid nodule   ?  ? ?Family History  ?Problem Relation Age of Onset  ? Renal cancer Brother   ? Diabetes Mother   ? Hypertension Mother   ? Renal Disease Mother   ? Healthy Father   ? ? ? ?Current Outpatient Medications:  ?  albuterol (PROVENTIL) (2.5 MG/3ML) 0.083% nebulizer solution, Take 3 mLs (2.5 mg total) by nebulization every 6 (six) hours as needed for wheezing or shortness of breath., Disp: 75 mL, Rfl: 12 ?  albuterol (VENTOLIN HFA) 108 (90 Base) MCG/ACT inhaler, TAKE 2 PUFFS BY MOUTH EVERY 6 HOURS AS NEEDED FOR WHEEZE OR SHORTNESS OF BREATH, Disp: 6.7 each, Rfl: 2 ?  aspirin 81 MG tablet, Take 81 mg by mouth every evening. , Disp: , Rfl:  ?  Blood Glucose Monitoring Suppl (TRUE METRIX AIR GLUCOSE METER) w/Device KIT, Use to check blood sugar 4 times a day. Dx code e11.65, Disp: 1 kit, Rfl: 2 ?  Calcium Carbonate-Vitamin D 600-400 MG-UNIT tablet, Take 1 tablet by mouth daily. (Patient taking differently: Take 1 tablet by mouth daily. 600 mg of calcium and 500 units of vitd), Disp:  , Rfl:  ?  cycloSPORINE (RESTASIS) 0.05 % ophthalmic emulsion, 1 drop 2 (two) times daily., Disp: , Rfl:  ?  donepezil (ARICEPT) 10 MG tablet, TAKE  1 TABLET BY MOUTH EVERY DAY IN THE EVENING, Disp: 90 tablet, Rfl: 1 ?  ezetimibe (ZETIA) 10 MG tablet, TAKE 1 TABLET BY MOUTH EVERYDAY AT BEDTIME, Disp: 90 tablet, Rfl: 1 ?  flunisolide (NASALIDE) 25 MCG/ACT (0.025%) SOLN, PLACE 2 SPRAYS INTO THE NOSE 2 (TWO) TIMES DAILY., Disp: 25 mL, Rfl: 1 ?  Fluticasone-Umeclidin-Vilant (TRELEGY ELLIPTA) 100-62.5-25 MCG/ACT AEPB, INHALE 1 PUFF ONE TIME DAILY, Disp: 180 each, Rfl: 3 ?  glucose blood (TRUE METRIX  BLOOD GLUCOSE TEST) test strip, CHECK BLOOD SUGAR TWO TIMES DAILY, Disp: 200 strip, Rfl: 2 ?  hydrocortisone cream 1 %, Apply to affected area 2 times daily, Disp: 30 g, Rfl: 5 ?  KLOR-CON M20 20 MEQ tablet, TAKE 1 TABLET BY MOUTH EVERY DAY WITH FOOD, Disp: 90 tablet, Rfl: 1 ?  magnesium oxide (MAG-OX) 400 MG tablet, Take 400 mg by mouth daily., Disp: , Rfl:  ?  Melatonin 2.5 MG CAPS, Take 2.5 mg by mouth at bedtime as needed (sleep). , Disp: , Rfl:  ?  mirtazapine (REMERON) 15 MG tablet, TAKE 1 TABLET BY MOUTH EVERYDAY AT BEDTIME, Disp: 90 tablet, Rfl: 1 ?  Multiple Vitamin (MULTIVITAMIN) tablet, Take 1 tablet by mouth daily., Disp: , Rfl:  ?  niacin (NIASPAN) 500 MG CR tablet, TAKE 1 TABLET BY MOUTH EVERY DAY IN THE EVENING, Disp: 90 tablet, Rfl: 3 ?  OVER THE COUNTER MEDICATION, Take 1 tablet by mouth daily., Disp: , Rfl:  ?  pantoprazole (PROTONIX) 40 MG tablet, TAKE 1 TABLET BY MOUTH TWICE A DAY, Disp: 180 tablet, Rfl: 1 ?  Probiotic Product (PROBIOTIC PO), Take by mouth., Disp: , Rfl:  ?  TRUEplus Lancets 33G MISC, TEST BLOOD SUGAR AS DIRECTED, Disp: 400 each, Rfl: 3 ?  valACYclovir (VALTREX) 500 MG tablet, TAKE 1 TABLET (500 MG TOTAL) BY MOUTH DAILY., Disp: 90 tablet, Rfl: 0 ?  White Petrolatum-Mineral Oil (SYSTANE NIGHTTIME OP), Place 1 drop into both eyes at bedtime as needed (for dry eyes). , Disp: , Rfl:   ? ?No Known Allergies  ? ?Review of Systems  ?Constitutional: Negative.  Negative for fatigue.  ?Respiratory: Negative.    ?Cardiovascular: Negative.   ?Gastrointestinal: Negative.   ?Endocrine: Negative for polydipsia, polyphagia and polyuria.  ?Neurological: Negative.  Negative for dizziness and headaches.  ?Psychiatric/Behavioral: Negative.     ? ?Today's Vitals  ? 12/30/21 1149  ?BP: 122/60  ?Pulse: 88  ?Resp: (!) 95  ?Temp: 98.1 ?F (36.7 ?C)  ?TempSrc: Oral  ?Weight: 163 lb (73.9 kg)  ?Height: 5' 2.2" (1.58 m)  ? ?Body mass index is 29.62 kg/m?.  ? ?Objective:  ?Physical Exam ?Vitals reviewed.   ?Constitutional:   ?   General: She is not in acute distress. ?   Appearance: Normal appearance.  ?Cardiovascular:  ?   Pulses: Normal pulses.  ?   Heart sounds: Normal heart sounds. No murmur heard. ?Pulmonary:  ?   Effort: Pulmonary effort is normal. No respiratory distress.  ?   Breath sounds: No wheezing.  ?   Comments: She does take some time to recover with her breathing with exertion ?Musculoskeletal:     ?   General: No swelling or tenderness.  ?Skin: ?   General: Skin is warm and dry.  ?Neurological:  ?   General: No focal deficit present.  ?   Mental Status: She is alert and oriented to person, place, and time.  ?   Cranial Nerves: No cranial nerve deficit.  ?  Motor: No weakness.  ?Psychiatric:     ?   Mood and Affect: Mood normal.     ?   Behavior: Behavior normal.     ?   Thought Content: Thought content normal.     ?   Judgment: Judgment normal.  ?  ? ?   ?Assessment And Plan:  ?   ?1. Diabetes mellitus with stage 3 chronic kidney disease ([place]) ?Comments: Discussed labs from last visit and the need for medications. If levels are elevated we may need to consider adding Farxiga, declines metformin ? ?2. Mixed hyperlipidemia ?Comments: Cholesterol levels were slightly improved continue to avoid fried and fatty foods.  ? ?3. Hypertensive nephropathy ?Comments: Blood pressure is normal.  ? ?4. Chronic obstructive pulmonary disease with acute exacerbation ([place]) ?Comments: Stable, continue follow up with Pulmonology Dr Elsworth Soho ? ?5. Aortic atherosclerosis ([place]) ?Comments: Continue statin, tolerating well ? ?6. Chronic bilateral low back pain without sciatica ?Comments: She is willing to go to PT, her lumbar spine xray showed mild degenerative disease.  ?- Ambulatory referral to Home Health ?  ? ? ?Patient was given opportunity to ask questions. Patient verbalized understanding of the plan and was able to repeat key elements of the plan. All questions were answered to their satisfaction.  ?[name], FNP   ? ?I, [name], FNP, have reviewed all documentation for this visit. The documentation on 12/30/21 for the exam, diagnosis, procedures, and orders are all accurate and complete.  ? ?IF YOU HAVE BEEN REFERRED TO

## 2021-12-30 NOTE — Addendum Note (Signed)
Addended by: [name] F on: 12/30/2021 12:47 PM ? ? Modules accepted: Orders ? ?

## 2021-12-30 NOTE — Patient Instructions (Addendum)
Hypertension, Adult ?High blood pressure (hypertension) is when the force of blood pumping through the arteries is too strong. The arteries are the blood vessels that carry blood from the heart throughout the body. Hypertension forces the heart to work harder to pump blood and may cause arteries to become narrow or stiff. Untreated or uncontrolled hypertension can lead to a heart attack, heart failure, a stroke, kidney disease, and other problems. ?A blood pressure reading consists of a higher number over a lower number. Ideally, your blood pressure should be below 120/80. The first ("top") number is called the systolic pressure. It is a measure of the pressure in your arteries as your heart beats. The second ("bottom") number is called the diastolic pressure. It is a measure of the pressure in your arteries as the heart relaxes. ?What are the causes? ?The exact cause of this condition is not known. There are some conditions that result in high blood pressure. ?What increases the risk? ?Certain factors may make you more likely to develop high blood pressure. Some of these risk factors are under your control, including: ?Smoking. ?Not getting enough exercise or physical activity. ?Being overweight. ?Having too much fat, sugar, calories, or salt (sodium) in your diet. ?Drinking too much alcohol. ?Other risk factors include: ?Having a personal history of heart disease, diabetes, high cholesterol, or kidney disease. ?Stress. ?Having a family history of high blood pressure and high cholesterol. ?Having obstructive sleep apnea. ?Age. The risk increases with age. ?What are the signs or symptoms? ?High blood pressure may not cause symptoms. Very high blood pressure (hypertensive crisis) may cause: ?Headache. ?Fast or irregular heartbeats (palpitations). ?Shortness of breath. ?Nosebleed. ?Nausea and vomiting. ?Vision changes. ?Severe chest pain, dizziness, and seizures. ?How is this diagnosed? ?This condition is diagnosed by  measuring your blood pressure while you are seated, with your arm resting on a flat surface, your legs uncrossed, and your feet flat on the floor. The cuff of the blood pressure monitor will be placed directly against the skin of your upper arm at the level of your heart. Blood pressure should be measured at least twice using the same arm. Certain conditions can cause a difference in blood pressure between your right and left arms. ?If you have a high blood pressure reading during one visit or you have normal blood pressure with other risk factors, you may be asked to: ?Return on a different day to have your blood pressure checked again. ?Monitor your blood pressure at home for 1 week or longer. ?If you are diagnosed with hypertension, you may have other blood or imaging tests to help your health care provider understand your overall risk for other conditions. ?How is this treated? ?This condition is treated by making healthy lifestyle changes, such as eating healthy foods, exercising more, and reducing your alcohol intake. You may be referred for counseling on a healthy diet and physical activity. ?Your health care provider may prescribe medicine if lifestyle changes are not enough to get your blood pressure under control and if: ?Your systolic blood pressure is above 130. ?Your diastolic blood pressure is above 80. ?Your personal target blood pressure may vary depending on your medical conditions, your age, and other factors. ?Follow these instructions at home: ?Eating and drinking ? ?Eat a diet that is high in fiber and potassium, and low in sodium, added sugar, and fat. An example of this eating plan is called the DASH diet. DASH stands for Dietary Approaches to Stop Hypertension. To eat this way: ?Eat  plenty of fresh fruits and vegetables. Try to fill one half of your plate at each meal with fruits and vegetables. ?Eat whole grains, such as whole-wheat pasta, brown rice, or whole-grain bread. Fill about one  fourth of your plate with whole grains. ?Eat or drink low-fat dairy products, such as skim milk or low-fat yogurt. ?Avoid fatty cuts of meat, processed or cured meats, and poultry with skin. Fill about one fourth of your plate with lean proteins, such as fish, chicken without skin, beans, eggs, or tofu. ?Avoid pre-made and processed foods. These tend to be higher in sodium, added sugar, and fat. ?Reduce your daily sodium intake. Many people with hypertension should eat less than 1,500 mg of sodium a day. ?Do not drink alcohol if: ?Your health care provider tells you not to drink. ?You are pregnant, may be pregnant, or are planning to become pregnant. ?If you drink alcohol: ?Limit how much you have to: ?0-1 drink a day for women. ?0-2 drinks a day for men. ?Know how much alcohol is in your drink. In the [place] one drink equals one 12 oz bottle of beer (355 mL), one 5 oz glass of wine (148 mL), or one 1? oz glass of hard liquor (44 mL). ?Lifestyle ? ?Work with your health care provider to maintain a healthy body weight or to lose weight. Ask what an ideal weight is for you. ?Get at least 30 minutes of exercise that causes your heart to beat faster (aerobic exercise) most days of the week. Activities may include walking, swimming, or biking. ?Include exercise to strengthen your muscles (resistance exercise), such as Pilates or lifting weights, as part of your weekly exercise routine. Try to do these types of exercises for 30 minutes at least 3 days a week. ?Do not use any products that contain nicotine or tobacco. These products include cigarettes, chewing tobacco, and vaping devices, such as e-cigarettes. If you need help quitting, ask your health care provider. ?Monitor your blood pressure at home as told by your health care provider. ?Keep all follow-up visits. This is important. ?Medicines ?Take over-the-counter and prescription medicines only as told by your health care provider. Follow directions carefully. Blood  pressure medicines must be taken as prescribed. ?Do not skip doses of blood pressure medicine. Doing this puts you at risk for problems and can make the medicine less effective. ?Ask your health care provider about side effects or reactions to medicines that you should watch for. ?Contact a health care provider if you: ?Think you are having a reaction to a medicine you are taking. ?Have headaches that keep coming back (recurring). ?Feel dizzy. ?Have swelling in your ankles. ?Have trouble with your vision. ?Get help right away if you: ?Develop a severe headache or confusion. ?Have unusual weakness or numbness. ?Feel faint. ?Have severe pain in your chest or abdomen. ?Vomit repeatedly. ?Have trouble breathing. ?These symptoms may be an emergency. Get help right away. Call 911. ?Do not wait to see if the symptoms will go away. ?Do not drive yourself to the hospital. ?Summary ?Hypertension is when the force of blood pumping through your arteries is too strong. If this condition is not controlled, it may put you at risk for serious complications. ?Your personal target blood pressure may vary depending on your medical conditions, your age, and other factors. For most people, a normal blood pressure is less than 120/80. ?Hypertension is treated with lifestyle changes, medicines, or a combination of both. Lifestyle changes include losing weight, eating a healthy,  low-sodium diet, exercising more, and limiting alcohol. ?This information is not intended to replace advice given to you by your health care provider. Make sure you discuss any questions you have with your health care provider. ?Document Revised: 06/30/2021 Document Reviewed: 06/30/2021 ?Elsevier Patient Education ? [place]. ? ? ?[place] - helps to protect your kidneys and treats diabetes.  ?

## 2021-12-31 LAB — LIPID PANEL
Chol/HDL Ratio: 4.4 ratio (ref 0.0–4.4)
Cholesterol, Total: 199 mg/dL (ref 100–199)
HDL: 45 mg/dL (ref >39)
LDL Chol Calc (NIH): 129 mg/dL — ABNORMAL HIGH (ref 0–99)
Triglycerides: 138 mg/dL (ref 0–149)
VLDL Cholesterol Cal: 25 mg/dL (ref 5–40)

## 2021-12-31 LAB — HEMOGLOBIN A1C
Est. average glucose Bld gHb Est-mCnc: 151 mg/dL
Hgb A1c MFr Bld: 6.9 % — ABNORMAL HIGH (ref 4.8–5.6)

## 2021-12-31 LAB — CMP14+EGFR
ALT: 24 IU/L (ref 0–32)
AST: 28 IU/L (ref 0–40)
Albumin/Globulin Ratio: 1.7 (ref 1.2–2.2)
Albumin: 4.3 g/dL (ref 3.7–4.7)
Alkaline Phosphatase: 89 IU/L (ref 44–121)
BUN/Creatinine Ratio: 10 — ABNORMAL LOW (ref 12–28)
BUN: 11 mg/dL (ref 8–27)
Bilirubin Total: 0.4 mg/dL (ref 0.0–1.2)
CO2: 27 mmol/L (ref 20–29)
Calcium: 9.8 mg/dL (ref 8.7–10.3)
Chloride: 105 mmol/L (ref 96–106)
Creatinine, Ser: 1.1 mg/dL — ABNORMAL HIGH (ref 0.57–1.00)
Globulin, Total: 2.6 g/dL (ref 1.5–4.5)
Glucose: 117 mg/dL — ABNORMAL HIGH (ref 70–99)
Potassium: 4.4 mmol/L (ref 3.5–5.2)
Sodium: 146 mmol/L — ABNORMAL HIGH (ref 134–144)
Total Protein: 6.9 g/dL (ref 6.0–8.5)
eGFR: 51 mL/min/{1.73_m2} — ABNORMAL LOW (ref >59)

## 2022-01-13 ENCOUNTER — Telehealth: Payer: Self-pay | Admitting: Pulmonary Disease

## 2022-01-13 MED ORDER — MOLNUPIRAVIR EUA 200MG CAPSULE: 4.0000 | ORAL_CAPSULE | Freq: Two times a day (BID) | ORAL | 0 refills | Status: AC

## 2022-01-13 MED ORDER — PREDNISONE 20 MG PO TABS: 20.0000 mg | ORAL_TABLET | Freq: Every day | ORAL | 0 refills | Status: DC

## 2022-01-13 NOTE — Telephone Encounter (Signed)
Called patient back and she states that her home covid test came back positive.  ?

## 2022-01-13 NOTE — Telephone Encounter (Signed)
Please offer her antiviral therapy : ? ?Molnupiravir 800 mg bid for 5 days ?Prednisone 20 mg for 5 days ? ?Monitor oxygen level and ED visit if drops less than 90% ?

## 2022-01-13 NOTE — Telephone Encounter (Signed)
Called and spoke with patient. She verbalized understanding. RX has been sent in. Nothing further needed at time of call.  ?

## 2022-01-13 NOTE — Telephone Encounter (Signed)
Called and spoke with patient who states that she is having symptoms of runny nose, fever, shortness of breath and wheezing that started yesterday. Most recent fever is 103. Not coughing anything up. Pharmacy is [facility]. Advised patient to do covid test at home and I was going to call her back to get results and we would go from there. ?

## 2022-01-29 ENCOUNTER — Other Ambulatory Visit: Payer: Self-pay

## 2022-01-29 MED ORDER — TRUEPLUS LANCETS 33G MISC: 3 refills | Status: DC

## 2022-02-09 ENCOUNTER — Encounter: Payer: Self-pay | Admitting: Nurse Practitioner

## 2022-02-09 ENCOUNTER — Ambulatory Visit (INDEPENDENT_AMBULATORY_CARE_PROVIDER_SITE_OTHER): Payer: Medicare HMO | Admitting: Nurse Practitioner

## 2022-02-09 VITALS — BP 122/88 | HR 87 | Temp 98.1°F | Ht 62.0 in | Wt 152.4 lb

## 2022-02-09 DIAGNOSIS — Z8616 Personal history of COVID-19: Secondary | ICD-10-CM | POA: Diagnosis not present

## 2022-02-09 DIAGNOSIS — Z6827 Body mass index (BMI) 27.0-27.9, adult: Secondary | ICD-10-CM | POA: Diagnosis not present

## 2022-02-09 DIAGNOSIS — N183 Chronic kidney disease, stage 3 unspecified: Secondary | ICD-10-CM | POA: Diagnosis not present

## 2022-02-09 DIAGNOSIS — J441 Chronic obstructive pulmonary disease with (acute) exacerbation: Secondary | ICD-10-CM

## 2022-02-09 DIAGNOSIS — R519 Headache, unspecified: Secondary | ICD-10-CM

## 2022-02-09 DIAGNOSIS — E1122 Type 2 diabetes mellitus with diabetic chronic kidney disease: Secondary | ICD-10-CM | POA: Diagnosis not present

## 2022-02-09 DIAGNOSIS — C3492 Malignant neoplasm of unspecified part of left bronchus or lung: Secondary | ICD-10-CM

## 2022-02-09 NOTE — Patient Instructions (Addendum)

## 2022-02-09 NOTE — Progress Notes (Signed)
I,Victoria T Hamilton,acting as a Education administrator for Shannon Brine, FNP.,have documented all relevant documentation on the behalf of Shannon Brine, FNP,as directed by  Shannon Brine, FNP while in the presence of [name], [place].   This visit occurred during the SARS-CoV-2 public health emergency.  Safety protocols were in place, including screening questions prior to the visit, additional usage of staff PPE, and extensive cleaning of exam room while observing appropriate contact time as indicated for disinfecting solutions.  Subjective:     Patient ID: [name] , female    DOB: August 26, 1941 , 81 y.o.   MRN: [id number]   Chief Complaint  Patient presents with   Covid Exposure    HPI  Pt presents for a follow up after having covid she reports on 01/28/2022. She continues to have no appetite, no sense of smell and she feels like her hearing is not right.  Denies ear pain. She will wake up with a headache posterior head and occasional frontal. She reports doing better since then. She was given prednisone from the pulmonologist when she was diagnosed.  She is taking a multivitamin. Blood sugar has been doing okay since being sick was up to 137           Past Medical History:  Diagnosis Date   Arthritis    Asthma    COPD (chronic obstructive pulmonary disease) ([place])    Depression    Diabetes mellitus without complication ([place])    Diverticulitis    GERD (gastroesophageal reflux disease)    Hyperlipidemia    Hypertension 06/15/2016   left lung ca dx'd 2016   Thyroid nodule      Family History  Problem Relation Age of Onset   Renal cancer Brother    Diabetes Mother    Hypertension Mother    Renal Disease Mother    Healthy Father      Current Outpatient Medications:    albuterol (PROVENTIL) (2.5 MG/3ML) 0.083% nebulizer solution, Take 3 mLs (2.5 mg total) by nebulization every 6 (six) hours as needed for wheezing or shortness of breath., Disp: 75 mL, Rfl: 12   albuterol  (VENTOLIN HFA) 108 (90 Base) MCG/ACT inhaler, TAKE 2 PUFFS BY MOUTH EVERY 6 HOURS AS NEEDED FOR WHEEZE OR SHORTNESS OF BREATH, Disp: 6.7 each, Rfl: 2   aspirin 81 MG tablet, Take 81 mg by mouth every evening. , Disp: , Rfl:    Blood Glucose Monitoring Suppl (TRUE METRIX AIR GLUCOSE METER) w/Device KIT, Use to check blood sugar 4 times a day. Dx code e11.65, Disp: 1 kit, Rfl: 2   Calcium Carbonate-Vitamin D 600-400 MG-UNIT tablet, Take 1 tablet by mouth daily. (Patient taking differently: Take 1 tablet by mouth daily. 600 mg of calcium and 500 units of vitd), Disp:  , Rfl:    cycloSPORINE (RESTASIS) 0.05 % ophthalmic emulsion, 1 drop 2 (two) times daily., Disp: , Rfl:    donepezil (ARICEPT) 10 MG tablet, TAKE 1 TABLET BY MOUTH EVERY DAY IN THE EVENING, Disp: 90 tablet, Rfl: 1   ezetimibe (ZETIA) 10 MG tablet, TAKE 1 TABLET BY MOUTH EVERYDAY AT BEDTIME, Disp: 90 tablet, Rfl: 1   flunisolide (NASALIDE) 25 MCG/ACT (0.025%) SOLN, PLACE 2 SPRAYS INTO THE NOSE 2 (TWO) TIMES DAILY., Disp: 25 mL, Rfl: 1   Fluticasone-Umeclidin-Vilant (TRELEGY ELLIPTA) 100-62.5-25 MCG/ACT AEPB, INHALE 1 PUFF ONE TIME DAILY, Disp: 180 each, Rfl: 3   glucose blood (TRUE METRIX BLOOD GLUCOSE TEST) test strip, CHECK BLOOD SUGAR TWO TIMES DAILY, Disp:  200 strip, Rfl: 2   hydrocortisone cream 1 %, Apply to affected area 2 times daily, Disp: 30 g, Rfl: 5   KLOR-CON M20 20 MEQ tablet, TAKE 1 TABLET BY MOUTH EVERY DAY WITH FOOD, Disp: 90 tablet, Rfl: 1   magnesium oxide (MAG-OX) 400 MG tablet, Take 400 mg by mouth daily., Disp: , Rfl:    Melatonin 2.5 MG CAPS, Take 2.5 mg by mouth at bedtime as needed (sleep). , Disp: , Rfl:    mirtazapine (REMERON) 15 MG tablet, TAKE 1 TABLET BY MOUTH EVERYDAY AT BEDTIME, Disp: 90 tablet, Rfl: 1   Multiple Vitamin (MULTIVITAMIN) tablet, Take 1 tablet by mouth daily., Disp: , Rfl:    niacin (NIASPAN) 500 MG CR tablet, TAKE 1 TABLET BY MOUTH EVERY DAY IN THE EVENING, Disp: 90 tablet, Rfl: 3   OVER THE  COUNTER MEDICATION, Take 1 tablet by mouth daily., Disp: , Rfl:    pantoprazole (PROTONIX) 40 MG tablet, TAKE 1 TABLET BY MOUTH TWICE A DAY, Disp: 180 tablet, Rfl: 1   predniSONE (DELTASONE) 20 MG tablet, Take 1 tablet (20 mg total) by mouth daily with breakfast., Disp: 5 tablet, Rfl: 0   Probiotic Product (PROBIOTIC PO), Take by mouth., Disp: , Rfl:    TRUEplus Lancets 33G MISC, TEST BLOOD SUGAR AS DIRECTED, Disp: 400 each, Rfl: 3   valACYclovir (VALTREX) 500 MG tablet, TAKE 1 TABLET (500 MG TOTAL) BY MOUTH DAILY., Disp: 90 tablet, Rfl: 0   White Petrolatum-Mineral Oil (SYSTANE NIGHTTIME OP), Place 1 drop into both eyes at bedtime as needed (for dry eyes). , Disp: , Rfl:    predniSONE (DELTASONE) 10 MG tablet, Take 4 tabs by mouth once daily x4 days, then 3 tabs x4 days, 2 tabs x4 days, 1 tab x4 days and stop., Disp: 40 tablet, Rfl: 0   No Known Allergies   Review of Systems  Constitutional: Negative.   HENT:  Negative for ear pain.        Feels like ear sensation of wanting to pop  Respiratory: Negative.    Cardiovascular: Negative.   Neurological: Negative.   Psychiatric/Behavioral: Negative.       Today's Vitals   02/09/22 1019  BP: 122/88  Pulse: 87  Temp: 98.1 F (36.7 C)  SpO2: 98%  Weight: 152 lb 6.4 oz (69.1 kg)  Height: $Remove'5\' 2"'hbtatSl$  (1.575 m)  PainSc: 0-No pain   Body mass index is 27.87 kg/m.  Wt Readings from Last 3 Encounters:  02/10/22 155 lb 6.4 oz (70.5 kg)  02/09/22 152 lb 6.4 oz (69.1 kg)  12/30/21 163 lb (73.9 kg)    Objective:  Physical Exam Vitals reviewed.  Constitutional:      General: She is not in acute distress.    Appearance: Normal appearance.  Cardiovascular:     Rate and Rhythm: Normal rate and regular rhythm.     Pulses: Normal pulses.     Heart sounds: Normal heart sounds. No murmur heard. Pulmonary:     Effort: Pulmonary effort is normal. No respiratory distress.     Breath sounds: Decreased breath sounds present. No wheezing or rales.   Neurological:     General: No focal deficit present.     Mental Status: She is alert and oriented to person, place, and time.     Cranial Nerves: No cranial nerve deficit.     Motor: No weakness.  Psychiatric:        Mood and Affect: Mood normal.  Behavior: Behavior normal.        Thought Content: Thought content normal.        Judgment: Judgment normal.         Assessment And Plan:     1. Acute nonintractable headache, unspecified headache type Comments: Tylenol has been effective, she is also encouraged to take melatonin nightly for 2 weeks there has been studies showing an antiinflammatory effect with covid  2. History of COVID-19 Comments: Overall feeling better continues to be fatigued encouraged to take a vitamin B supplement. Encouraged it will take time  3. Diabetes mellitus with stage 3 chronic kidney disease ([place]) Comments: Blood sugar has been controlled in spite of being sick with Covid a few weeks ago.   4. Chronic obstructive pulmonary disease with acute exacerbation ([place]) Comments: Continue follow up with Pulmonology.   5. Adenocarcinoma of left lung ([place]) Comments: Continue follow up with Pulmonology  6. BMI 27.0-27.9,adult     Patient was given opportunity to ask questions. Patient verbalized understanding of the plan and was able to repeat key elements of the plan. All questions were answered to their satisfaction.  Shannon Brine, FNP   I, Shannon Brine, FNP, have reviewed all documentation for this visit. The documentation on 02/09/22 for the exam, diagnosis, procedures, and orders are all accurate and complete.   IF YOU HAVE BEEN REFERRED TO A SPECIALIST, IT MAY TAKE 1-2 WEEKS TO SCHEDULE/PROCESS THE REFERRAL. IF YOU HAVE NOT HEARD FROM US/SPECIALIST IN TWO WEEKS, PLEASE GIVE [name] A CALL AT [phone].   THE PATIENT IS ENCOURAGED TO PRACTICE SOCIAL DISTANCING DUE TO THE COVID-19 PANDEMIC.

## 2022-02-10 ENCOUNTER — Encounter: Payer: Self-pay | Admitting: Pulmonary Disease

## 2022-02-10 ENCOUNTER — Ambulatory Visit (INDEPENDENT_AMBULATORY_CARE_PROVIDER_SITE_OTHER): Payer: Medicare HMO | Admitting: Pulmonary Disease

## 2022-02-10 ENCOUNTER — Ambulatory Visit (INDEPENDENT_AMBULATORY_CARE_PROVIDER_SITE_OTHER): Payer: Medicare HMO

## 2022-02-10 VITALS — BP 136/72 | HR 109 | Temp 98.3°F | Ht 62.0 in | Wt 155.4 lb

## 2022-02-10 DIAGNOSIS — C3492 Malignant neoplasm of unspecified part of left bronchus or lung: Secondary | ICD-10-CM | POA: Diagnosis not present

## 2022-02-10 DIAGNOSIS — R059 Cough, unspecified: Secondary | ICD-10-CM | POA: Diagnosis not present

## 2022-02-10 DIAGNOSIS — R062 Wheezing: Secondary | ICD-10-CM | POA: Diagnosis not present

## 2022-02-10 DIAGNOSIS — J441 Chronic obstructive pulmonary disease with (acute) exacerbation: Secondary | ICD-10-CM | POA: Diagnosis not present

## 2022-02-10 MED ORDER — PREDNISONE 10 MG PO TABS: ORAL_TABLET | ORAL | 0 refills | Status: DC

## 2022-02-10 NOTE — Progress Notes (Signed)
   Subjective:    Patient ID: [name], female    DOB: [birth year], 81 y.o.   MRN: [id number]  HPI   81 yo ex-smoker for follow-up of COPD and lung cancer She smoked about 60 pack years. She worked in a [employer] for 20 years   06/2015 underwent left VATS with left upper lobectomy and mediastinal lymph node dissection. Path  showed invasive adenocarcinoma,  adenocarcinoma involving the visceral pleura in addition to lymphovascular invasion. The resection margins as well as the dissected lymph nodes were negative for malignancy. She had persistent air leak postop and required endobronchial valves for resolution  Chief Complaint  Patient presents with   Follow-up    Follow up. Patient says she is just getting over covid. Still has a cough and shortness of breath.    5/10 she called for runny nose, fever, home covid test + Rxed with prednisone & molnupiravir  She complains of persistent shortness of breath and a dry cough since then.  Accompanied by her nephew who corroborates symptoms. She is unable to bring up any sputum.  She denies fevers. There is no pedal edema or orthopnea She is using albuterol MDI but has not used any nebs Nephew reports that her appetite has dropped off and she is not eating at all and has lost 10 pounds since her COVID diagnosis  I reviewed PCP visit and prior consultation with oncology  Significant tests/ events reviewed  05/2021 CT chest >> stable areas of scarring left upper lobe postsurgical, right lower lobe nodular scarring 12/2017 CT chest >> stable, nodular area of left lung scarring 2016 Pre-op PFT - FEV1 73 %, DLCO 48%   03/2016 FEV1 66%   Spirometry 10/2017 shows an FEV1 at 63% ratio 57 and FVC of 86%.    Review of Systems neg for any significant sore throat, dysphagia, itching, sneezing, nasal congestion or excess/ purulent secretions, fever, chills, sweats, unintended wt loss, pleuritic or exertional cp, hempoptysis, orthopnea pnd  or change in chronic leg swelling. Also denies presyncope, palpitations, heartburn, abdominal pain, nausea, vomiting, diarrhea or change in bowel or urinary habits, dysuria,hematuria, rash, arthralgias, visual complaints, headache, numbness weakness or ataxia.     Objective:   Physical Exam   Gen. Pleasant, well-nourished, in no distress ENT - no thrush, no pallor/icterus,no post nasal drip Neck: No JVD, no thyromegaly, no carotid bruits Lungs: no use of accessory muscles, no dullness to percussion, decreased bilateral without rales or rhonchi  Cardiovascular: Rhythm regular, heart sounds  normal, no murmurs or gallops, no peripheral edema Musculoskeletal: No deformities, no cyanosis or clubbing         Assessment & Plan:

## 2022-02-10 NOTE — Assessment & Plan Note (Signed)
She will undergo 1 year follow-up surveillance CT scan in September 2023

## 2022-02-10 NOTE — Patient Instructions (Signed)
  X CXR today  X Prednisone 10 mg tabs Take 4 tabs  daily with food x 4 days, then 3 tabs daily x 4 days, then 2 tabs daily x 4 days, then 1 tab daily x4 days then stop. #40  Take albuterol nebs once daily x 5-7 daily

## 2022-02-10 NOTE — Assessment & Plan Note (Signed)
We will treat her like a COPD exacerbation with a course of steroids, likely postviral, does not seem to be bacterial  Prednisone 10 mg tabs Take 4 tabs  daily with food x 4 days, then 3 tabs daily x 4 days, then 2 tabs daily x 4 days, then 1 tab daily x4 days then stop. #40  I have asked her to use her albuterol nebs at least once daily and more as needed  Chest x-ray independently reviewed shows prominent interstitial marking at the bases which is similar to her prior film in2022

## 2022-02-12 ENCOUNTER — Telehealth: Payer: Self-pay | Admitting: Pulmonary Disease

## 2022-02-12 MED ORDER — AMOXICILLIN 500 MG PO TABS: 500.0000 mg | ORAL_TABLET | Freq: Three times a day (TID) | ORAL | 0 refills | Status: AC

## 2022-02-12 NOTE — Telephone Encounter (Signed)
ATC LVMTCB x 1  

## 2022-02-12 NOTE — Telephone Encounter (Signed)
Called and spoke with patient. She verbalized understanding. I was able to get her scheduled to see TP on 07/03 at 230pm since Dr. Elsworth Soho did not have any openings.   She is aware of the amoxicillin and verbalized understanding of instructions. RX has been sent to [facility].   Nothing further needed at time of call.

## 2022-02-12 NOTE — Telephone Encounter (Signed)
Spoke with [name] at [facility]. Confirmed report received by office. Judson Roch could you please advise as Dr. Elsworth Soho is unavailable.    CLINICAL DATA:  Wheezing and cough.   EXAM: CHEST - 2 VIEW   COMPARISON:  Chest radiograph 04/22/2021 chest CT 05/26/2021   FINDINGS: Increased interstitial densities in the mid and lower right lung. Slightly increased interstitial densities in the left lower lung. There is a new 1.2 cm nodular structure in the left upper chest overlying the medial left clavicle. Nodular density was not clearly present on the previous chest CT. No large pleural effusions. No acute bone abnormality.   IMPRESSION: 1. Increased interstitial densities in both lungs and these are concerning for an infectious etiology. 2. Indeterminate 1.2 cm nodular density in the left upper chest which appears new. Recommend further characterization with a chest CT.   These results will be called to the ordering clinician or representative by the Radiologist Assistant, and communication documented in the PACS or [employer].

## 2022-02-25 DIAGNOSIS — H04123 Dry eye syndrome of bilateral lacrimal glands: Secondary | ICD-10-CM | POA: Diagnosis not present

## 2022-02-25 DIAGNOSIS — E119 Type 2 diabetes mellitus without complications: Secondary | ICD-10-CM | POA: Diagnosis not present

## 2022-02-25 DIAGNOSIS — H26493 Other secondary cataract, bilateral: Secondary | ICD-10-CM | POA: Diagnosis not present

## 2022-02-25 DIAGNOSIS — H35031 Hypertensive retinopathy, right eye: Secondary | ICD-10-CM | POA: Diagnosis not present

## 2022-02-25 DIAGNOSIS — H524 Presbyopia: Secondary | ICD-10-CM | POA: Diagnosis not present

## 2022-02-25 LAB — HM DIABETES EYE EXAM

## 2022-03-01 ENCOUNTER — Other Ambulatory Visit: Payer: Self-pay | Admitting: Pulmonary Disease

## 2022-03-08 ENCOUNTER — Encounter: Payer: Self-pay | Admitting: Adult Health

## 2022-03-08 ENCOUNTER — Ambulatory Visit (INDEPENDENT_AMBULATORY_CARE_PROVIDER_SITE_OTHER): Payer: Medicare HMO | Admitting: Adult Health

## 2022-03-08 ENCOUNTER — Ambulatory Visit (INDEPENDENT_AMBULATORY_CARE_PROVIDER_SITE_OTHER): Payer: Medicare HMO

## 2022-03-08 VITALS — BP 120/70 | HR 107 | Temp 98.4°F | Ht 62.0 in | Wt 152.4 lb

## 2022-03-08 DIAGNOSIS — J441 Chronic obstructive pulmonary disease with (acute) exacerbation: Secondary | ICD-10-CM | POA: Diagnosis not present

## 2022-03-08 DIAGNOSIS — J449 Chronic obstructive pulmonary disease, unspecified: Secondary | ICD-10-CM

## 2022-03-08 DIAGNOSIS — Z8701 Personal history of pneumonia (recurrent): Secondary | ICD-10-CM | POA: Diagnosis not present

## 2022-03-08 DIAGNOSIS — C3492 Malignant neoplasm of unspecified part of left bronchus or lung: Secondary | ICD-10-CM

## 2022-03-08 MED ORDER — ALBUTEROL SULFATE (2.5 MG/3ML) 0.083% IN NEBU: 2.5000 mg | INHALATION_SOLUTION | Freq: Four times a day (QID) | RESPIRATORY_TRACT | 12 refills | Status: AC | PRN

## 2022-03-08 NOTE — Progress Notes (Signed)
_0  ID: Gae Gallop, female    DOB: 12/10/1940, 81 y.o.   MRN: [id number]  Chief Complaint  Patient presents with   Follow-up    Referring provider: [name], FNP  HPI: 81 year old female former smoker followed for COPD and previous lung cancer Patient underwent a left VATS with left upper lobectomy and mediastinal lymph node dissection in October 2016-pathology showed invasive adenocarcinoma, adenocarcinoma involving the visceral pleura in addition to the lymphovascular invasion.  Resection margins as well as dissected lymph nodes were negative for malignancy.  Postop had persistent air leak and required endobronchial valves for resolution.  TEST/EVENTS :  2020 CT chest >> stable areas of scarring left upper lobe postsurgical, right lower lobe nodular scarring 12/2017 CT chest >> stable, nodular area of left lung scarring 2016 Pre-op PFT - FEV1 73 %, DLCO 48%   03/2016 FEV1 66%   Spirometry 10/2017 shows an FEV1 at 63% ratio 57 and FVC of 86%.    CT chest September 2020 showed stable nodular areas of architectural distortion in the lungs bilaterally most compatible with areas of chronic postinfectious scarring.  No evidence of recurrent or metastatic disease in the thorax.  Moderate emphysema.   03/08/2022 Follow up : COPD , Pneumonia  Patient returns for 1 month follow-up.  Patient was seen last visit for COPD exacerbation and pneumonia.  Patient was treated with amoxicillin for 7 days.  And prednisone taper.  Patient says she is feeling better with decreased cough and congestion.  Has ongoing fatigue and low energy.  She remains on Trelegy inhaler daily.  Denies any hemoptysis, fever, chest pain or orthopnea.  Chest x-ray last visit showed slightly increased interstitial densities in the left lower lung.  New 1.2 cm nodular structure in the left upper chest.   Patient remains active.  Lives at home.  Does her cooking cleaning and shopping.  Is able to drive. She says she  does activities and then rest and then gets back up to do more activities.  She denies any change in baseline activities. She is not on oxygen.  Has history of lung cancer as above.  She is status post left upper lobectomy 2016.  has upcoming surveillance CT in September. CT chest June 03, 2021 showed no evidence of recurrent or metastatic disease.  Status post left upper lobectomy.   No Known Allergies  Immunization History  Administered Date(s) Administered   Fluad Quad(high Dose 65+) 07/01/2020, 08/17/2021   Influenza, High Dose Seasonal PF 06/06/2016, 05/01/2017, 05/31/2018, 06/28/2019   Influenza, Quadrivalent, Recombinant, Inj, Pf 06/03/2019   Influenza-Unspecified 05/01/2017   Moderna SARS-COV2 Booster Vaccination 09/10/2020   Moderna Sars-Covid-2 Vaccination 10/19/2019, 11/16/2019   Pneumococcal Conjugate-13 07/17/2014, 06/15/2018   Pneumococcal Polysaccharide-23 09/07/2011    Past Medical History:  Diagnosis Date   Arthritis    Asthma    COPD (chronic obstructive pulmonary disease) ([place])    Depression    Diabetes mellitus without complication ([place])    Diverticulitis    GERD (gastroesophageal reflux disease)    Hyperlipidemia    Hypertension 06/15/2016   left lung ca dx'd 2016   Thyroid nodule     Tobacco History: Social History   Tobacco Use  Smoking Status Former   Packs/day: 1.00   Years: 56.00   Total pack years: 56.00   Types: Cigarettes   Quit date: 06/22/2015   Years since quitting: 6.7  Smokeless Tobacco Never   Counseling given: Not Answered   Outpatient Medications Prior to Visit  Medication Sig Dispense Refill   albuterol (VENTOLIN HFA) 108 (90 Base) MCG/ACT inhaler TAKE 2 PUFFS BY MOUTH EVERY 6 HOURS AS NEEDED FOR WHEEZE OR SHORTNESS OF BREATH 6.7 each 2   aspirin 81 MG tablet Take 81 mg by mouth every evening.      Blood Glucose Monitoring Suppl (TRUE METRIX AIR GLUCOSE METER) w/Device KIT Use to check blood sugar 4 times a day. Dx  code e11.65 1 kit 2   Calcium Carbonate-Vitamin D 600-400 MG-UNIT tablet Take 1 tablet by mouth daily. (Patient taking differently: Take 1 tablet by mouth daily. 600 mg of calcium and 500 units of vitd)     cycloSPORINE (RESTASIS) 0.05 % ophthalmic emulsion 1 drop 2 (two) times daily.     donepezil (ARICEPT) 10 MG tablet TAKE 1 TABLET BY MOUTH EVERY DAY IN THE EVENING 90 tablet 1   ezetimibe (ZETIA) 10 MG tablet TAKE 1 TABLET BY MOUTH EVERYDAY AT BEDTIME 90 tablet 1   flunisolide (NASALIDE) 25 MCG/ACT (0.025%) SOLN PLACE 2 SPRAYS INTO THE NOSE 2 (TWO) TIMES DAILY. 25 mL 1   Fluticasone-Umeclidin-Vilant (TRELEGY ELLIPTA) 100-62.5-25 MCG/ACT AEPB INHALE 1 PUFF ONE TIME DAILY 180 each 3   glucose blood (TRUE METRIX BLOOD GLUCOSE TEST) test strip CHECK BLOOD SUGAR TWO TIMES DAILY 200 strip 2   hydrocortisone cream 1 % Apply to affected area 2 times daily 30 g 5   KLOR-CON M20 20 MEQ tablet TAKE 1 TABLET BY MOUTH EVERY DAY WITH FOOD 90 tablet 1   magnesium oxide (MAG-OX) 400 MG tablet Take 400 mg by mouth daily.     Melatonin 2.5 MG CAPS Take 2.5 mg by mouth at bedtime as needed (sleep).      mirtazapine (REMERON) 15 MG tablet TAKE 1 TABLET BY MOUTH EVERYDAY AT BEDTIME 90 tablet 1   Multiple Vitamin (MULTIVITAMIN) tablet Take 1 tablet by mouth daily.     niacin (NIASPAN) 500 MG CR tablet TAKE 1 TABLET BY MOUTH EVERY DAY IN THE EVENING 90 tablet 3   OVER THE COUNTER MEDICATION Take 1 tablet by mouth daily.     pantoprazole (PROTONIX) 40 MG tablet TAKE 1 TABLET BY MOUTH TWICE A DAY 180 tablet 1   predniSONE (DELTASONE) 10 MG tablet Take 4 tabs by mouth once daily x4 days, then 3 tabs x4 days, 2 tabs x4 days, 1 tab x4 days and stop. 40 tablet 0   predniSONE (DELTASONE) 20 MG tablet Take 1 tablet (20 mg total) by mouth daily with breakfast. 5 tablet 0   Probiotic Product (PROBIOTIC PO) Take by mouth.     TRUEplus Lancets 33G MISC TEST BLOOD SUGAR AS DIRECTED 400 each 3   valACYclovir (VALTREX) 500 MG  tablet TAKE 1 TABLET (500 MG TOTAL) BY MOUTH DAILY. 90 tablet 0   White Petrolatum-Mineral Oil (SYSTANE NIGHTTIME OP) Place 1 drop into both eyes at bedtime as needed (for dry eyes).      albuterol (PROVENTIL) (2.5 MG/3ML) 0.083% nebulizer solution Take 3 mLs (2.5 mg total) by nebulization every 6 (six) hours as needed for wheezing or shortness of breath. 75 mL 12   No facility-administered medications prior to visit.     Review of Systems:   Constitutional:   No  weight loss, night sweats,  Fevers, chills,  +fatigue, or  lassitude.  HEENT:   No headaches,  Difficulty swallowing,  Tooth/dental problems, or  Sore throat,  No sneezing, itching, ear ache, nasal congestion, post nasal drip,   CV:  No chest pain,  Orthopnea, PND, swelling in lower extremities, anasarca, dizziness, palpitations, syncope.   GI  No heartburn, indigestion, abdominal pain, nausea, vomiting, diarrhea, change in bowel habits, loss of appetite, bloody stools.   Resp: .  No chest wall deformity  Skin: no rash or lesions.  GU: no dysuria, change in color of urine, no urgency or frequency.  No flank pain, no hematuria   MS:  No joint pain or swelling.  No decreased range of motion.  No back pain.    Physical Exam  BP 120/70 (BP Location: Left Arm, Patient Position: Sitting, Cuff Size: Normal)   Pulse (!) 107   Temp 98.4 F (36.9 C) (Oral)   Ht _0  (1.575 m)   Wt 152 lb 6.4 oz (69.1 kg)   SpO2 96%   BMI 27.87 kg/m   GEN: A/Ox3; pleasant , NAD, well nourished    HEENT:  [place]/AT,  NOSE-clear, THROAT-clear, no lesions, no postnasal drip or exudate noted.   NECK:  Supple w/ fair ROM; no JVD; normal carotid impulses w/o bruits; no thyromegaly or nodules palpated; no lymphadenopathy.    RESP  Clear  P & A; w/o, wheezes/ rales/ or rhonchi. no accessory muscle use, no dullness to percussion  CARD:  RRR, no m/r/g, no peripheral edema, pulses intact, no cyanosis or clubbing.  GI:   Soft & nt;  nml bowel sounds; no organomegaly or masses detected.   Musco: Warm bil, no deformities or joint swelling noted.   Neuro: alert, no focal deficits noted.    Skin: Warm, no lesions or rashes    Lab Results:      BNP No results found for: "BNP"  ProBNP No results found for: "PROBNP"  Imaging: DG Chest 2 View  Result Date: 02/11/2022 CLINICAL DATA:  Wheezing and cough. EXAM: CHEST - 2 VIEW COMPARISON:  Chest radiograph 04/22/2021 chest CT 05/26/2021 FINDINGS: Increased interstitial densities in the mid and lower right lung. Slightly increased interstitial densities in the left lower lung. There is a new 1.2 cm nodular structure in the left upper chest overlying the medial left clavicle. Nodular density was not clearly present on the previous chest CT. No large pleural effusions. No acute bone abnormality. IMPRESSION: 1. Increased interstitial densities in both lungs and these are concerning for an infectious etiology. 2. Indeterminate 1.2 cm nodular density in the left upper chest which appears new. Recommend further characterization with a chest CT. These results will be called to the ordering clinician or representative by the Radiologist Assistant, and communication documented in the PACS or [employer]. Electronically Signed   By: Markus Daft M.D.   On: 02/11/2022 21:22         Latest Ref Rng & Units 06/09/2015    3:44 PM  PFT Results  FVC-Pre L 2.04   FVC-Predicted Pre % 93   FVC-Post L 2.02   FVC-Predicted Post % 92   Pre FEV1/FVC % % 60   Post FEV1/FCV % % 62   FEV1-Pre L 1.22   FEV1-Predicted Pre % 72   FEV1-Post L 1.24   DLCO uncorrected ml/min/mmHg 11.34   DLCO UNC% % 48   DLVA Predicted % 49   TLC L 4.77   TLC % Predicted % 95   RV % Predicted % 111     No results found for: "NITRICOXIDE"      Assessment & Plan:  COPD (chronic obstructive pulmonary disease) (HCC) Recent exacerbation with possible underlying pneumonia.  Patient is clinically  improved with antibiotics and steroids.  We will continue on her baseline regimen.  Check chest x-ray today.  Plan  Patient Instructions  Continue on TRELEGY daily . Rinse after use.  Albuterol inhaler or Neb As needed   Continue on Zyrtec 86m At bedtime As needed    Saline nasal rinses As needed   Continue on Flonase daily As needed   Follow up with CT chest this fall as planned .  Follow up with Dr MJulien Nordmannthis fall .  Chest xray today  Follow up with Dr. AElsworth Soho In 2 months as planned  and  and As needed   Please contact office for sooner follow up if symptoms do not improve or worsen or seek emergency care       Adenocarcinoma of left lung ([facility] Status post left upper lobectomy 2016.  CT chest September 2022 showed no evidence of recurrence.  Most recently treated for suspected underlying pneumonia.  Chest x-ray showed a 1.2 nodular density in the left upper chest .  We will check chest x-ray today for follow-up.  If not resolved will need to set up for CT chest.     TRexene Edison NP 03/08/2022

## 2022-03-08 NOTE — Patient Instructions (Addendum)
Continue on TRELEGY daily . Rinse after use.  Albuterol inhaler or Neb As needed   Continue on Zyrtec 10mg  At bedtime As needed    Saline nasal rinses As needed   Continue on Flonase daily As needed   Follow up with CT chest this fall as planned .  Follow up with Dr Julien Nordmann this fall .  Chest xray today  Follow up with Dr. Elsworth Soho  In 2 months as planned  and  and As needed   Please contact office for sooner follow up if symptoms do not improve or worsen or seek emergency care

## 2022-03-08 NOTE — Assessment & Plan Note (Signed)
Recent exacerbation with possible underlying pneumonia.  Patient is clinically improved with antibiotics and steroids.  We will continue on her baseline regimen.  Check chest x-ray today.  Plan  Patient Instructions  Continue on TRELEGY daily . Rinse after use.  Albuterol inhaler or Neb As needed   Continue on Zyrtec 10mg  At bedtime As needed    Saline nasal rinses As needed   Continue on Flonase daily As needed   Follow up with CT chest this fall as planned .  Follow up with Dr Julien Nordmann this fall .  Chest xray today  Follow up with Dr. Elsworth Soho  In 2 months as planned  and  and As needed   Please contact office for sooner follow up if symptoms do not improve or worsen or seek emergency care

## 2022-03-08 NOTE — Assessment & Plan Note (Signed)
Status post left upper lobectomy 2016.  CT chest September 2022 showed no evidence of recurrence.  Most recently treated for suspected underlying pneumonia.  Chest x-ray showed a 1.2 nodular density in the left upper chest .  We will check chest x-ray today for follow-up.  If not resolved will need to set up for CT chest.

## 2022-03-10 ENCOUNTER — Telehealth: Payer: Self-pay | Admitting: Adult Health

## 2022-03-10 NOTE — Telephone Encounter (Signed)
Shannon Needles, NP  03/08/2022  5:04 PM EDT     Chest xray is better with decreased interstitial markings,  Previous nodule is not seen well.  Make sure she keep CT chest in September to follow up lung cancer surveillance    Please contact office for sooner follow up if symptoms do not improve or worsen or seek emergency care     Called and spoke with patient. She verbalized understanding of results. Nothing further needed at time of call.

## 2022-03-10 NOTE — Progress Notes (Signed)
ATC x1.  LVM to return call. 

## 2022-04-01 ENCOUNTER — Other Ambulatory Visit: Payer: Self-pay | Admitting: Nurse Practitioner

## 2022-04-09 ENCOUNTER — Other Ambulatory Visit: Payer: Self-pay | Admitting: Nurse Practitioner

## 2022-04-09 DIAGNOSIS — G4700 Insomnia, unspecified: Secondary | ICD-10-CM

## 2022-04-26 ENCOUNTER — Other Ambulatory Visit: Payer: Self-pay | Admitting: Internal Medicine

## 2022-04-27 ENCOUNTER — Other Ambulatory Visit: Payer: Self-pay | Admitting: Nurse Practitioner

## 2022-05-03 ENCOUNTER — Encounter: Payer: Self-pay | Admitting: Nurse Practitioner

## 2022-05-03 ENCOUNTER — Ambulatory Visit (INDEPENDENT_AMBULATORY_CARE_PROVIDER_SITE_OTHER): Payer: Medicare HMO | Admitting: Nurse Practitioner

## 2022-05-03 VITALS — BP 124/68 | HR 94 | Temp 98.1°F | Ht 62.0 in | Wt 159.0 lb

## 2022-05-03 DIAGNOSIS — N183 Chronic kidney disease, stage 3 unspecified: Secondary | ICD-10-CM

## 2022-05-03 DIAGNOSIS — J441 Chronic obstructive pulmonary disease with (acute) exacerbation: Secondary | ICD-10-CM | POA: Diagnosis not present

## 2022-05-03 DIAGNOSIS — E1122 Type 2 diabetes mellitus with diabetic chronic kidney disease: Secondary | ICD-10-CM | POA: Diagnosis not present

## 2022-05-03 DIAGNOSIS — L659 Nonscarring hair loss, unspecified: Secondary | ICD-10-CM

## 2022-05-03 DIAGNOSIS — Z23 Encounter for immunization: Secondary | ICD-10-CM

## 2022-05-03 DIAGNOSIS — I700 Atherosclerosis of aorta: Secondary | ICD-10-CM

## 2022-05-03 DIAGNOSIS — E782 Mixed hyperlipidemia: Secondary | ICD-10-CM

## 2022-05-03 DIAGNOSIS — L609 Nail disorder, unspecified: Secondary | ICD-10-CM

## 2022-05-03 DIAGNOSIS — I129 Hypertensive chronic kidney disease with stage 1 through stage 4 chronic kidney disease, or unspecified chronic kidney disease: Secondary | ICD-10-CM | POA: Diagnosis not present

## 2022-05-03 NOTE — Progress Notes (Signed)
I,Tianna Badgett,acting as a Education administrator for [employer], FNP.,have documented all relevant documentation on the behalf of [name], FNP,as directed by  [name], FNP while in the presence of [name], [place].  Subjective:     Patient ID: [name] , female    DOB: Aug 01, 1941 , 81 y.o.   MRN: [id number]   Chief Complaint  Patient presents with   Diabetes   Hypertension    HPI  Patient is here for HTN and DM follow up. Request faxed for DM eye exam. O2  was 85% when first seated in room. O2 is currently 92% after seated in office for 10 mins. Pt states that she does not think that she can get the shingles vaccine since she takes Valtrex daily. She is having more hair shedding since having covid.   Diabetes She presents for her follow-up diabetic visit. She has type 2 diabetes mellitus. Her disease course has been stable. Pertinent negatives for hypoglycemia include no dizziness or headaches. Pertinent negatives for diabetes include no fatigue, no polydipsia, no polyphagia and no polyuria. There are no hypoglycemic complications. There are no diabetic complications. Risk factors for coronary artery disease include sedentary lifestyle. Current diabetic treatment includes diet. She is following a generally healthy diet. When asked about meal planning, she reported none. She has not had a previous visit with a dietitian. She rarely participates in exercise. (Blood sugars are 140-153) She does not see a podiatrist.Eye exam current: scheduled to see eye doctor in June.  Hyperlipidemia This is a chronic problem. The current episode started more than 1 year ago. The problem is controlled. Recent lipid tests were reviewed and are variable. There are no known factors aggravating her hyperlipidemia. Current antihyperlipidemic treatment includes ezetimibe. There are no compliance problems.  Risk factors for coronary artery disease include diabetes mellitus, dyslipidemia and a sedentary lifestyle.      Past Medical History:  Diagnosis Date   Arthritis    Asthma    COPD (chronic obstructive pulmonary disease) ([place])    Depression    Diabetes mellitus without complication ([place])    Diverticulitis    GERD (gastroesophageal reflux disease)    Hyperlipidemia    Hypertension 06/15/2016   left lung ca dx'd 2016   Thyroid nodule      Family History  Problem Relation Age of Onset   Renal cancer Brother    Diabetes Mother    Hypertension Mother    Renal Disease Mother    Healthy Father      Current Outpatient Medications:    albuterol (PROVENTIL) (2.5 MG/3ML) 0.083% nebulizer solution, Take 3 mLs (2.5 mg total) by nebulization every 6 (six) hours as needed for wheezing or shortness of breath., Disp: 75 mL, Rfl: 12   albuterol (VENTOLIN HFA) 108 (90 Base) MCG/ACT inhaler, TAKE 2 PUFFS BY MOUTH EVERY 6 HOURS AS NEEDED FOR WHEEZE OR SHORTNESS OF BREATH, Disp: 6.7 each, Rfl: 2   aspirin 81 MG tablet, Take 81 mg by mouth every evening. , Disp: , Rfl:    Blood Glucose Monitoring Suppl (TRUE METRIX AIR GLUCOSE METER) w/Device KIT, Use to check blood sugar 4 times a day. Dx code e11.65, Disp: 1 kit, Rfl: 2   Calcium Carbonate-Vitamin D 600-400 MG-UNIT tablet, Take 1 tablet by mouth daily. (Patient taking differently: Take 1 tablet by mouth daily. 600 mg of calcium and 500 units of vitd), Disp:  , Rfl:    cycloSPORINE (RESTASIS) 0.05 % ophthalmic emulsion, 1 drop 2 (two)  times daily., Disp: , Rfl:    donepezil (ARICEPT) 10 MG tablet, TAKE 1 TABLET BY MOUTH EVERY DAY IN THE EVENING, Disp: 90 tablet, Rfl: 1   empagliflozin (JARDIANCE) 10 MG TABS tablet, Take 1 tablet (10 mg total) by mouth daily before breakfast., Disp: 30 tablet, Rfl: 2   ezetimibe (ZETIA) 10 MG tablet, TAKE 1 TABLET BY MOUTH EVERYDAY AT BEDTIME, Disp: 90 tablet, Rfl: 1   flunisolide (NASALIDE) 25 MCG/ACT (0.025%) SOLN, PLACE 2 SPRAYS INTO THE NOSE 2 (TWO) TIMES DAILY., Disp: 25 mL, Rfl: 1   Fluticasone-Umeclidin-Vilant  (TRELEGY ELLIPTA) 100-62.5-25 MCG/ACT AEPB, INHALE 1 PUFF ONE TIME DAILY, Disp: 180 each, Rfl: 3   glucose blood (TRUE METRIX BLOOD GLUCOSE TEST) test strip, USE AS INSTRUCTED TO CHECK BLOOD SUGAR TWICE DAILY, Disp: 200 strip, Rfl: 2   KLOR-CON M20 20 MEQ tablet, TAKE 1 TABLET BY MOUTH EVERY DAY WITH FOOD, Disp: 90 tablet, Rfl: 1   magnesium oxide (MAG-OX) 400 MG tablet, Take 400 mg by mouth daily., Disp: , Rfl:    Melatonin 2.5 MG CAPS, Take 2.5 mg by mouth at bedtime as needed (sleep). , Disp: , Rfl:    mirtazapine (REMERON) 15 MG tablet, TAKE 1 TABLET BY MOUTH EVERYDAY AT BEDTIME, Disp: 90 tablet, Rfl: 1   Multiple Vitamin (MULTIVITAMIN) tablet, Take 1 tablet by mouth daily., Disp: , Rfl:    niacin (NIASPAN) 500 MG CR tablet, TAKE 1 TABLET BY MOUTH EVERY DAY IN THE EVENING, Disp: 90 tablet, Rfl: 3   OVER THE COUNTER MEDICATION, Take 1 tablet by mouth daily., Disp: , Rfl:    pantoprazole (PROTONIX) 40 MG tablet, TAKE 1 TABLET BY MOUTH TWICE A DAY, Disp: 180 tablet, Rfl: 1   Probiotic Product (PROBIOTIC PO), Take by mouth., Disp: , Rfl:    TRUEplus Lancets 33G MISC, TEST BLOOD SUGAR AS DIRECTED, Disp: 400 each, Rfl: 3   valACYclovir (VALTREX) 500 MG tablet, TAKE 1 TABLET (500 MG TOTAL) BY MOUTH DAILY., Disp: 90 tablet, Rfl: 0   White Petrolatum-Mineral Oil (SYSTANE NIGHTTIME OP), Place 1 drop into both eyes at bedtime as needed (for dry eyes). , Disp: , Rfl:    No Known Allergies   Review of Systems  Constitutional: Negative.  Negative for fatigue.  Respiratory: Negative.    Cardiovascular: Negative.   Gastrointestinal: Negative.   Endocrine: Negative for polydipsia, polyphagia and polyuria.  Skin:        Hair loss and nail color change  Neurological: Negative.  Negative for dizziness and headaches.  Psychiatric/Behavioral: Negative.       Today's Vitals   05/03/22 1142  BP: 124/68  Pulse: 94  Temp: 98.1 F (36.7 C)  TempSrc: Oral  SpO2: 92%  Weight: 159 lb (72.1 kg)  Height:  $Remove'5\' 2"'lBUHUWB$  (1.575 m)   Body mass index is 29.08 kg/m.   Objective:  Physical Exam Vitals reviewed.  Constitutional:      General: She is not in acute distress.    Appearance: Normal appearance.  Cardiovascular:     Pulses: Normal pulses.     Heart sounds: Normal heart sounds. No murmur heard. Pulmonary:     Effort: Pulmonary effort is normal. No respiratory distress.     Breath sounds: No wheezing.     Comments: Continues to have some shortness of breath however recovers well.  Musculoskeletal:        General: No swelling or tenderness.  Skin:    General: Skin is warm and dry.  Comments: Nail beds with linear light colored lines   Neurological:     General: No focal deficit present.     Mental Status: She is alert and oriented to person, place, and time.     Cranial Nerves: No cranial nerve deficit.     Motor: No weakness.  Psychiatric:        Mood and Affect: Mood normal.        Behavior: Behavior normal.        Thought Content: Thought content normal.        Judgment: Judgment normal.         Assessment And Plan:     1. Diabetes mellitus with stage 3 chronic kidney disease ([place]) Comments: HgbA1c was slightly elevated at last visit, she is currently not on any medications - Hemoglobin A1c  2. Chronic obstructive pulmonary disease with acute exacerbation ([facility]) Comments: Stable, continue current medications and follow up with Pulmonology  3. Hypertensive nephropathy Comments: Blood pressure is well controlled, continue current medications - BMP8+EGFR  4. Mixed hyperlipidemia Comments: Stable, continue current medications. Tolerated well - Lipid panel  5. Aortic atherosclerosis ([facility]) Comments: Continue statin, tolerating well.   6. Hair loss Comments: This may be due to recent Covid infection, according to [employer] al  Hair loss (20% of patients) and skin rash which resolved in 97% of patients. Will check metabolic causes - Vitamin B12 - TSH  7. Abnormality  of nail surface Comments: This may be related to her COPD vs vitamin deficiency - VITAMIN D 25 Hydroxy (Vit-D Deficiency, Fractures)   Patient was given opportunity to ask questions. Patient verbalized understanding of the plan and was able to repeat key elements of the plan. All questions were answered to their satisfaction.  [name], FNP   I, [name], FNP, have reviewed all documentation for this visit. The documentation on 05/03/22 for the exam, diagnosis, procedures, and orders are all accurate and complete.   IF YOU HAVE BEEN REFERRED TO A SPECIALIST, IT MAY TAKE 1-2 WEEKS TO SCHEDULE/PROCESS THE REFERRAL. IF YOU HAVE NOT HEARD FROM US/SPECIALIST IN TWO WEEKS, PLEASE GIVE [name] A CALL AT [phone].   THE PATIENT IS ENCOURAGED TO PRACTICE SOCIAL DISTANCING DUE TO THE COVID-19 PANDEMIC.

## 2022-05-03 NOTE — Patient Instructions (Signed)

## 2022-05-04 ENCOUNTER — Other Ambulatory Visit: Payer: Self-pay | Admitting: Nurse Practitioner

## 2022-05-04 DIAGNOSIS — N183 Chronic kidney disease, stage 3 unspecified: Secondary | ICD-10-CM

## 2022-05-04 LAB — TSH: TSH: 2.22 u[IU]/mL (ref 0.450–4.500)

## 2022-05-04 LAB — VITAMIN D 25 HYDROXY (VIT D DEFICIENCY, FRACTURES): Vit D, 25-Hydroxy: 63.7 ng/mL (ref 30.0–100.0)

## 2022-05-04 LAB — BMP8+EGFR
BUN/Creatinine Ratio: 13 (ref 12–28)
BUN: 14 mg/dL (ref 8–27)
CO2: 22 mmol/L (ref 20–29)
Calcium: 9.9 mg/dL (ref 8.7–10.3)
Chloride: 101 mmol/L (ref 96–106)
Creatinine, Ser: 1.05 mg/dL — ABNORMAL HIGH (ref 0.57–1.00)
Glucose: 126 mg/dL — ABNORMAL HIGH (ref 70–99)
Potassium: 4.1 mmol/L (ref 3.5–5.2)
Sodium: 142 mmol/L (ref 134–144)
eGFR: 53 mL/min/{1.73_m2} — ABNORMAL LOW (ref >59)

## 2022-05-04 LAB — HEMOGLOBIN A1C
Est. average glucose Bld gHb Est-mCnc: 151 mg/dL
Hgb A1c MFr Bld: 6.9 % — ABNORMAL HIGH (ref 4.8–5.6)

## 2022-05-04 LAB — VITAMIN B12: Vitamin B-12: 859 pg/mL (ref 232–1245)

## 2022-05-04 MED ORDER — EMPAGLIFLOZIN 10 MG PO TABS: 10.0000 mg | ORAL_TABLET | Freq: Every day | ORAL | 2 refills | Status: DC

## 2022-05-05 ENCOUNTER — Encounter: Payer: Self-pay | Admitting: Nurse Practitioner

## 2022-05-05 DIAGNOSIS — H26492 Other secondary cataract, left eye: Secondary | ICD-10-CM | POA: Diagnosis not present

## 2022-05-07 ENCOUNTER — Encounter: Payer: Self-pay | Admitting: Nurse Practitioner

## 2022-05-14 DIAGNOSIS — N183 Chronic kidney disease, stage 3 unspecified: Secondary | ICD-10-CM | POA: Insufficient documentation

## 2022-05-14 DIAGNOSIS — I129 Hypertensive chronic kidney disease with stage 1 through stage 4 chronic kidney disease, or unspecified chronic kidney disease: Secondary | ICD-10-CM | POA: Insufficient documentation

## 2022-05-14 DIAGNOSIS — I700 Atherosclerosis of aorta: Secondary | ICD-10-CM | POA: Insufficient documentation

## 2022-05-18 ENCOUNTER — Ambulatory Visit: Payer: Medicare HMO | Admitting: Pulmonary Disease

## 2022-05-20 ENCOUNTER — Ambulatory Visit (INDEPENDENT_AMBULATORY_CARE_PROVIDER_SITE_OTHER): Payer: Medicare HMO

## 2022-05-20 VITALS — BP 140/60 | HR 77 | Temp 98.4°F | Ht 62.0 in | Wt 159.0 lb

## 2022-05-20 DIAGNOSIS — Z23 Encounter for immunization: Secondary | ICD-10-CM

## 2022-05-20 NOTE — Progress Notes (Signed)
Patient presents today for a flu shot.

## 2022-05-25 ENCOUNTER — Ambulatory Visit: Payer: Medicare HMO

## 2022-05-27 ENCOUNTER — Other Ambulatory Visit: Payer: Self-pay | Admitting: Pulmonary Disease

## 2022-05-28 ENCOUNTER — Inpatient Hospital Stay: Payer: Medicare HMO | Attending: Internal Medicine

## 2022-05-28 ENCOUNTER — Ambulatory Visit (HOSPITAL_COMMUNITY)
Admission: RE | Admit: 2022-05-28 | Discharge: 2022-05-28 | Disposition: A | Discharge status: Home / Self Care | Payer: Medicare HMO | Source: Ambulatory Visit | Attending: Internal Medicine | Admitting: Internal Medicine

## 2022-05-28 ENCOUNTER — Other Ambulatory Visit: Payer: Self-pay

## 2022-05-28 DIAGNOSIS — C3490 Malignant neoplasm of unspecified part of unspecified bronchus or lung: Secondary | ICD-10-CM | POA: Insufficient documentation

## 2022-05-28 DIAGNOSIS — Z79899 Other long term (current) drug therapy: Secondary | ICD-10-CM | POA: Insufficient documentation

## 2022-05-28 DIAGNOSIS — Z85118 Personal history of other malignant neoplasm of bronchus and lung: Secondary | ICD-10-CM | POA: Insufficient documentation

## 2022-05-28 DIAGNOSIS — J439 Emphysema, unspecified: Secondary | ICD-10-CM | POA: Diagnosis not present

## 2022-05-28 DIAGNOSIS — Z902 Acquired absence of lung [part of]: Secondary | ICD-10-CM | POA: Insufficient documentation

## 2022-05-28 LAB — CMP (CANCER CENTER ONLY)
ALT: 22 U/L (ref 0–44)
AST: 27 U/L (ref 15–41)
Albumin: 4.3 g/dL (ref 3.5–5.0)
Alkaline Phosphatase: 73 U/L (ref 38–126)
Anion gap: 4 — ABNORMAL LOW (ref 5–15)
BUN: 20 mg/dL (ref 8–23)
CO2: 34 mmol/L — ABNORMAL HIGH (ref 22–32)
Calcium: 9.9 mg/dL (ref 8.9–10.3)
Chloride: 103 mmol/L (ref 98–111)
Creatinine: 1.07 mg/dL — ABNORMAL HIGH (ref 0.44–1.00)
GFR, Estimated: 52 mL/min — ABNORMAL LOW (ref >60)
Glucose, Bld: 183 mg/dL — ABNORMAL HIGH (ref 70–99)
Potassium: 4.1 mmol/L (ref 3.5–5.1)
Sodium: 141 mmol/L (ref 135–145)
Total Bilirubin: 0.6 mg/dL (ref 0.3–1.2)
Total Protein: 7.5 g/dL (ref 6.5–8.1)

## 2022-05-28 LAB — CBC WITH DIFFERENTIAL (CANCER CENTER ONLY)
Abs Immature Granulocytes: 0.01 10*3/uL (ref 0.00–0.07)
Basophils Absolute: 0 10*3/uL (ref 0.0–0.1)
Basophils Relative: 1 %
Eosinophils Absolute: 0.1 10*3/uL (ref 0.0–0.5)
Eosinophils Relative: 1 %
HCT: 44.3 % (ref 36.0–46.0)
Hemoglobin: 14.9 g/dL (ref 12.0–15.0)
Immature Granulocytes: 0 %
Lymphocytes Relative: 44 %
Lymphs Abs: 3.2 10*3/uL (ref 0.7–4.0)
MCH: 31.4 pg (ref 26.0–34.0)
MCHC: 33.6 g/dL (ref 30.0–36.0)
MCV: 93.3 fL (ref 80.0–100.0)
Monocytes Absolute: 0.6 10*3/uL (ref 0.1–1.0)
Monocytes Relative: 9 %
Neutro Abs: 3.1 10*3/uL (ref 1.7–7.7)
Neutrophils Relative %: 45 %
Platelet Count: 212 10*3/uL (ref 150–400)
RBC: 4.75 MIL/uL (ref 3.87–5.11)
RDW: 14.3 % (ref 11.5–15.5)
WBC Count: 7 10*3/uL (ref 4.0–10.5)
nRBC: 0 % (ref 0.0–0.2)

## 2022-05-28 MED ORDER — IOHEXOL 300 MG/ML  SOLN
100.0000 mL | Freq: Once | INTRAMUSCULAR | Status: AC | PRN
  Administered 2022-05-28: 100 mL via INTRAVENOUS

## 2022-05-28 MED ORDER — SODIUM CHLORIDE (PF) 0.9 % IJ SOLN
INTRAMUSCULAR | Status: AC
  Filled 2022-05-28: qty 50

## 2022-06-01 ENCOUNTER — Encounter: Payer: Self-pay | Admitting: Internal Medicine

## 2022-06-01 ENCOUNTER — Inpatient Hospital Stay (HOSPITAL_BASED_OUTPATIENT_CLINIC_OR_DEPARTMENT_OTHER): Payer: Medicare HMO | Admitting: Internal Medicine

## 2022-06-01 ENCOUNTER — Other Ambulatory Visit: Payer: Self-pay

## 2022-06-01 VITALS — BP 135/84 | HR 105 | Temp 98.3°F | Resp 18 | Wt 161.1 lb

## 2022-06-01 DIAGNOSIS — C3492 Malignant neoplasm of unspecified part of left bronchus or lung: Secondary | ICD-10-CM

## 2022-06-01 DIAGNOSIS — Z902 Acquired absence of lung [part of]: Secondary | ICD-10-CM | POA: Diagnosis not present

## 2022-06-01 DIAGNOSIS — Z79899 Other long term (current) drug therapy: Secondary | ICD-10-CM | POA: Diagnosis not present

## 2022-06-01 DIAGNOSIS — Z85118 Personal history of other malignant neoplasm of bronchus and lung: Secondary | ICD-10-CM | POA: Diagnosis not present

## 2022-06-01 NOTE — Progress Notes (Signed)
[facility] [phone]   [phone]  OFFICE PROGRESS NOTE  [name], [place] [address] [address]  DIAGNOSIS: Stage IB (T2a, N0, M0) non-small cell lung cancer, adenocarcinoma presented with left upper lobe nodule diagnosed in September 2016.  PRIOR THERAPY: Status post left VATS with left upper lobectomy and mediastinal lymph node dissection on 07/04/2015 under the care of Dr. Roxan Hockey.  CURRENT THERAPY: Observation.  INTERVAL HISTORY: [name] 81 y.o. female returns to the clinic today for annual follow-up visit.  The patient is feeling fine today with no concerning complaints.  She denied having any chest pain, shortness of breath, cough or hemoptysis.  She has no nausea, vomiting, diarrhea or constipation.  She has no headache or visual changes.  She denied having any recent weight loss or night sweats.  She is here today for evaluation with repeat CT scan of the chest for restaging of her disease.  MEDICAL HISTORY: Past Medical History:  Diagnosis Date   Arthritis    Asthma    COPD (chronic obstructive pulmonary disease) ([place])    Depression    Diabetes mellitus without complication ([place])    Diverticulitis    GERD (gastroesophageal reflux disease)    Hyperlipidemia    Hypertension 06/15/2016   left lung ca dx'd 2016   Thyroid nodule     ALLERGIES:  has No Known Allergies.  MEDICATIONS:  Current Outpatient Medications  Medication Sig Dispense Refill   albuterol (PROVENTIL) (2.5 MG/3ML) 0.083% nebulizer solution Take 3 mLs (2.5 mg total) by nebulization every 6 (six) hours as needed for wheezing or shortness of breath. 75 mL 12   albuterol (VENTOLIN HFA) 108 (90 Base) MCG/ACT inhaler TAKE 2 PUFFS BY MOUTH EVERY 6 HOURS AS NEEDED FOR WHEEZE OR SHORTNESS OF BREATH 6.7 each 2   aspirin 81 MG tablet Take 81 mg by mouth every evening.      Blood Glucose Monitoring Suppl (TRUE METRIX AIR GLUCOSE  METER) w/Device KIT Use to check blood sugar 4 times a day. Dx code e11.65 1 kit 2   Calcium Carbonate-Vitamin D 600-400 MG-UNIT tablet Take 1 tablet by mouth daily. (Patient taking differently: Take 1 tablet by mouth daily. 600 mg of calcium and 500 units of vitd)     cycloSPORINE (RESTASIS) 0.05 % ophthalmic emulsion 1 drop 2 (two) times daily.     donepezil (ARICEPT) 10 MG tablet TAKE 1 TABLET BY MOUTH EVERY DAY IN THE EVENING 90 tablet 1   empagliflozin (JARDIANCE) 10 MG TABS tablet Take 1 tablet (10 mg total) by mouth daily before breakfast. 30 tablet 2   ezetimibe (ZETIA) 10 MG tablet TAKE 1 TABLET BY MOUTH EVERYDAY AT BEDTIME 90 tablet 1   flunisolide (NASALIDE) 25 MCG/ACT (0.025%) SOLN PLACE 2 SPRAYS INTO THE NOSE 2 (TWO) TIMES DAILY. 25 mL 1   Fluticasone-Umeclidin-Vilant (TRELEGY ELLIPTA) 100-62.5-25 MCG/ACT AEPB INHALE 1 PUFF ONE TIME DAILY 180 each 3   glucose blood (TRUE METRIX BLOOD GLUCOSE TEST) test strip USE AS INSTRUCTED TO CHECK BLOOD SUGAR TWICE DAILY 200 strip 2   KLOR-CON M20 20 MEQ tablet TAKE 1 TABLET BY MOUTH EVERY DAY WITH FOOD 90 tablet 1   magnesium oxide (MAG-OX) 400 MG tablet Take 400 mg by mouth daily.     Melatonin 2.5 MG CAPS Take 2.5 mg by mouth at bedtime as needed (sleep).      mirtazapine (REMERON) 15 MG tablet TAKE 1 TABLET BY  MOUTH EVERYDAY AT BEDTIME 90 tablet 1   Multiple Vitamin (MULTIVITAMIN) tablet Take 1 tablet by mouth daily.     niacin (NIASPAN) 500 MG CR tablet TAKE 1 TABLET BY MOUTH EVERY DAY IN THE EVENING 90 tablet 3   OVER THE COUNTER MEDICATION Take 1 tablet by mouth daily.     pantoprazole (PROTONIX) 40 MG tablet TAKE 1 TABLET BY MOUTH TWICE A DAY 180 tablet 1   Probiotic Product (PROBIOTIC PO) Take by mouth.     TRUEplus Lancets 33G MISC TEST BLOOD SUGAR AS DIRECTED 400 each 3   valACYclovir (VALTREX) 500 MG tablet TAKE 1 TABLET (500 MG TOTAL) BY MOUTH DAILY. 90 tablet 0   White Petrolatum-Mineral Oil (SYSTANE NIGHTTIME OP) Place 1 drop into  both eyes at bedtime as needed (for dry eyes).      No current facility-administered medications for this visit.    SURGICAL HISTORY:  Past Surgical History:  Procedure Laterality Date   BIOPSY THYROID     CATARACT EXTRACTION     left eye   CATARACT EXTRACTION W/ INTRAOCULAR LENS IMPLANT     RIGHT   CESAREAN SECTION     EYE SURGERY     MASS EXCISION N/A 12/13/2014   Procedure: EXCISION OF VOCAL CORD POLYPS;  Surgeon: Jerrell Belfast, MD;  Location: [facility];  Service: ENT;  Laterality: N/A;   MICROLARYNGOSCOPY N/A 12/13/2014   Procedure: MICROLARYNGOSCOPY;  Surgeon: Jerrell Belfast, MD;  Location: [facility];  Service: ENT;  Laterality: N/A;   TUBAL LIGATION     VIDEO ASSISTED THORACOSCOPY (VATS)/ LOBECTOMY Left 07/04/2015   Procedure: VIDEO ASSISTED THORACOSCOPY (VATS) LEFT UPPER LOBECTOMY;  Surgeon: Melrose Nakayama, MD;  Location: [facility];  Service: Thoracic;  Laterality: Left;   VIDEO BRONCHOSCOPY WITH INSERTION OF INTERBRONCHIAL VALVE (IBV) N/A 07/11/2015   Procedure: VIDEO BRONCHOSCOPY WITH INSERTION OF INTERBRONCHIAL VALVE (IBV);  Surgeon: Melrose Nakayama, MD;  Location: [facility];  Service: Thoracic;  Laterality: N/A;   VIDEO BRONCHOSCOPY WITH INSERTION OF INTERBRONCHIAL VALVE (IBV) N/A 09/10/2015   Procedure: VIDEO BRONCHOSCOPY WITH REMOVAL OF INTERBRONCHIAL VALVES;  Surgeon: Melrose Nakayama, MD;  Location: [facility];  Service: Thoracic;  Laterality: N/A;    REVIEW OF SYSTEMS:  A comprehensive review of systems was negative.   PHYSICAL EXAMINATION: General appearance: alert, cooperative and no distress Head: Normocephalic, without obvious abnormality, atraumatic Neck: no adenopathy, no JVD, supple, symmetrical, trachea midline and thyroid not enlarged, symmetric, no tenderness/mass/nodules Lymph nodes: Cervical, supraclavicular, and axillary nodes normal. Resp: clear to auscultation bilaterally Back: symmetric, no curvature. ROM normal. No CVA tenderness. Cardio: regular rate and  rhythm, S1, S2 normal, no murmur, click, rub or gallop GI: soft, non-tender; bowel sounds normal; no masses,  no organomegaly Extremities: extremities normal, atraumatic, no cyanosis or edema  ECOG PERFORMANCE STATUS: 1 - Symptomatic but completely ambulatory  Blood pressure 135/84, pulse (!) 105, temperature 98.3 F (36.8 C), temperature source Oral, resp. rate 18, weight 161 lb 1.6 oz (73.1 kg), SpO2 95 %.  LABORATORY DATA: Lab Results  Component Value Date   WBC 7.0 05/28/2022   HGB 14.9 05/28/2022   HCT 44.3 05/28/2022   MCV 93.3 05/28/2022   PLT 212 05/28/2022      Chemistry      Component Value Date/Time   NA 141 05/28/2022 1148   NA 142 05/03/2022 1225   NA 142 06/20/2017 1502   K 4.1 05/28/2022 1148   K 4.0 06/20/2017 1502   CL 103  05/28/2022 1148   CO2 34 (H) 05/28/2022 1148   CO2 29 06/20/2017 1502   BUN 20 05/28/2022 1148   BUN 14 05/03/2022 1225   BUN 16.0 06/20/2017 1502   CREATININE 1.07 (H) 05/28/2022 1148   CREATININE 1.2 (H) 06/20/2017 1502   GLU 108 12/08/2017 0000      Component Value Date/Time   CALCIUM 9.9 05/28/2022 1148   CALCIUM 9.8 06/20/2017 1502   ALKPHOS 73 05/28/2022 1148   ALKPHOS 74 06/20/2017 1502   AST 27 05/28/2022 1148   AST 35 (H) 06/20/2017 1502   ALT 22 05/28/2022 1148   ALT 30 06/20/2017 1502   BILITOT 0.6 05/28/2022 1148   BILITOT 0.94 06/20/2017 1502       RADIOGRAPHIC STUDIES: CT Chest W Contrast  Result Date: 05/30/2022 CLINICAL DATA:  Stage IB non-small cell left upper lobe lung cancer status post left upper lobectomy in 2016. Interval observation. Restaging. * Tracking Code: BO * EXAM: CT CHEST WITH CONTRAST TECHNIQUE: Multidetector CT imaging of the chest was performed during intravenous contrast administration. RADIATION DOSE REDUCTION: This exam was performed according to the departmental dose-optimization program which includes automated exposure control, adjustment of the mA and/or kV according to patient size  and/or use of iterative reconstruction technique. CONTRAST:  125m OMNIPAQUE IOHEXOL 300 MG/ML  SOLN COMPARISON:  05/26/2021 chest CT. FINDINGS: Cardiovascular: Normal heart size. No significant pericardial effusion/thickening. Left anterior descending and right coronary atherosclerosis. Atherosclerotic nonaneurysmal thoracic aorta. Normal caliber pulmonary arteries. No central pulmonary emboli. Mediastinum/Nodes: Multinodular goiter asymmetrically enlarged on the right with dominant 2.6 cm posterior right thyroid nodule, unchanged. This has been evaluated on previous imaging. (ref: J Am Coll Radiol. 2015 Feb;12(2): 143-50). Unremarkable esophagus. No pathologically enlarged axillary, mediastinal or hilar lymph nodes. Lungs/Pleura: No pneumothorax. No pleural effusion. Status post left upper lobectomy. Severe centrilobular emphysema with mild diffuse bronchial wall thickening. Subpleural nodular 1.7 x 0.8 cm focus of consolidation in the peripheral left lower lobe (series 5/image 95), unchanged back to at least 05/26/2020 chest CT, compatible with nodular scarring. No acute consolidative airspace disease, lung masses or new significant pulmonary nodules. Upper abdomen: No acute abnormality. Musculoskeletal: No aggressive appearing focal osseous lesions. Mild thoracic spondylosis. IMPRESSION: 1. No evidence of local tumor recurrence status post left upper lobectomy. 2. No evidence of metastatic disease in the chest. 3. Two-vessel coronary atherosclerosis. 4. Aortic Atherosclerosis (ICD10-I70.0) and Emphysema (ICD10-J43.9). Electronically Signed   By: JIlona SorrelM.D.   On: 05/30/2022 20:39     ASSESSMENT AND PLAN:  This is a very pleasant 81years old African-American female with stage IB non-small cell lung cancer status post right upper lobectomy with lymph node dissection in September 2016. The patient has been in observation since that time and she is feeling fine with no concerning complaints. She had  repeat CT scan of the chest performed recently.  I personally and independently reviewed the scan and discussed the result with the patient today. Her scan showed no concerning findings for disease recurrence or metastasis. I recommended for her to continue on observation and routine follow-up visit with her primary care physician from now on.  I will see her on as-needed basis. The patient was advised to call if she has any concerning symptoms. The patient voices understanding of current disease status and treatment options and is in agreement with the current care plan. All questions were answered. The patient knows to call the clinic with any problems, questions or concerns. We can certainly  see the patient much sooner if necessary.   Disclaimer: This note was dictated with voice recognition software. Similar sounding words can inadvertently be transcribed and may not be corrected upon review.

## 2022-06-07 ENCOUNTER — Ambulatory Visit (INDEPENDENT_AMBULATORY_CARE_PROVIDER_SITE_OTHER): Payer: Medicare HMO | Admitting: Pulmonary Disease

## 2022-06-07 ENCOUNTER — Encounter: Payer: Self-pay | Admitting: Pulmonary Disease

## 2022-06-07 DIAGNOSIS — R0609 Other forms of dyspnea: Secondary | ICD-10-CM | POA: Diagnosis not present

## 2022-06-07 DIAGNOSIS — J432 Centrilobular emphysema: Secondary | ICD-10-CM | POA: Diagnosis not present

## 2022-06-07 DIAGNOSIS — H26493 Other secondary cataract, bilateral: Secondary | ICD-10-CM | POA: Diagnosis not present

## 2022-06-07 NOTE — Progress Notes (Signed)
   Subjective:    Patient ID: [name], female    DOB: [birth year], 81 y.o.   MRN: [id number]  HPI  81  yo ex-smoker for follow-up of COPD and lung cancer She smoked about 60 pack years. She worked in a [employer] for 20 years   06/2015 underwent left VATS with left upper lobectomy and mediastinal lymph node dissection. Path  showed invasive adenocarcinoma,  adenocarcinoma involving the visceral pleura in addition to lymphovascular invasion. The resection margins as well as the dissected lymph nodes were negative for malignancy. She had persistent air leak postop and required endobronchial valves for resolution  Last OV 02/2022 -treated with a prednisone taper due to persistent shortness of breath after COVID infection in May 2023 .  Chest x-ray showed increased interstitial densities in the left lower lung and a new 1.2 cm nodular density in left upper chest, I gave her a course of amoxicillin. She had a repeat CT chest in September Reviewed APP visit 7/3  She has gained 30 pounds since 2016!  Nephew accompanies and reports night snacking.  She has been started on Jardiance, did not want to start metformin. Breathing is okay, she seldom needs albuterol   Significant tests/ events reviewed  05/2022 CT chest >> nodular scarring left lower lobe  05/2021 CT chest >> stable areas of scarring left upper lobe postsurgical,leftlower lobe nodular scarring 12/2017 CT chest >> stable, nodular area of left lung scarring 2016 Pre-op PFT - FEV1 73 %, DLCO 48%   03/2016 FEV1 66%   Spirometry 10/2017 shows an FEV1 at 63% ratio 57 and FVC of 86%.  Review of Systems neg for any significant sore throat, dysphagia, itching, sneezing, nasal congestion or excess/ purulent secretions, fever, chills, sweats, unintended wt loss, pleuritic or exertional cp, hempoptysis, orthopnea pnd or change in chronic leg swelling. Also denies presyncope, palpitations, heartburn, abdominal pain, nausea, vomiting,  diarrhea or change in bowel or urinary habits, dysuria,hematuria, rash, arthralgias, visual complaints, headache, numbness weakness or ataxia.     Objective:   Physical Exam  Gen. Pleasant, well-nourished, in no distress ENT - no thrush, no pallor/icterus,no post nasal drip Neck: No JVD, no thyromegaly, no carotid bruits Lungs: no use of accessory muscles, no dullness to percussion, coarse bilateral without rales or rhonchi  Cardiovascular: Rhythm regular, heart sounds  normal, no murmurs or gallops, no peripheral edema Musculoskeletal: No deformities, no cyanosis or clubbing        Assessment & Plan:

## 2022-06-07 NOTE — Assessment & Plan Note (Addendum)
Weight gain of 30 pounds and deconditioning. Recommend pulmonary rehab RSV and COVID booster recommended.  She is flu vaccinated already

## 2022-06-07 NOTE — Assessment & Plan Note (Signed)
Continue Trelegy. Use albuterol for rescue. We discussed COPD action plan and signs and symptoms of COPD exacerbation

## 2022-06-07 NOTE — Patient Instructions (Signed)
RSV & covid booster reocmmended

## 2022-06-15 ENCOUNTER — Ambulatory Visit (INDEPENDENT_AMBULATORY_CARE_PROVIDER_SITE_OTHER): Payer: Medicare HMO | Admitting: Nurse Practitioner

## 2022-06-15 ENCOUNTER — Encounter: Payer: Self-pay | Admitting: Nurse Practitioner

## 2022-06-15 VITALS — BP 118/82 | HR 98 | Temp 98.1°F | Ht 62.0 in | Wt 161.6 lb

## 2022-06-15 DIAGNOSIS — E1122 Type 2 diabetes mellitus with diabetic chronic kidney disease: Secondary | ICD-10-CM

## 2022-06-15 DIAGNOSIS — J441 Chronic obstructive pulmonary disease with (acute) exacerbation: Secondary | ICD-10-CM | POA: Diagnosis not present

## 2022-06-15 DIAGNOSIS — N183 Chronic kidney disease, stage 3 unspecified: Secondary | ICD-10-CM

## 2022-06-15 DIAGNOSIS — Z6829 Body mass index (BMI) 29.0-29.9, adult: Secondary | ICD-10-CM | POA: Diagnosis not present

## 2022-06-15 NOTE — Progress Notes (Signed)
I,Victoria T Hamilton,acting as a Education administrator for [name], FNP.,have documented all relevant documentation on the behalf of [name], FNP,as directed by  [name], FNP while in the presence of [name], [place].    Subjective:     Patient ID: [name] , female    DOB: 1940/11/26 , 81 y.o.   MRN: [id number]   Chief Complaint  Patient presents with   Diabetes   Hypertension    HPI  Patient is here for HTN and DM follow up. She reports taking Jardiance $RemoveBeforeDE'10mg'FFFdhfSTvLcBihP$  for 2 weeks. Denies any side effects.  She has a concern with getting the shingrix vaccine, while on Valtrex. [facility] would not administer shot since she is being treated for a strain.   Diabetes She presents for her follow-up diabetic visit. She has type 2 diabetes mellitus. Her disease course has been stable. Pertinent negatives for hypoglycemia include no dizziness or headaches. Pertinent negatives for diabetes include no fatigue, no polydipsia, no polyphagia and no polyuria. There are no hypoglycemic complications. There are no diabetic complications. Risk factors for coronary artery disease include sedentary lifestyle. Current diabetic treatment includes diet. She is following a generally healthy diet. When asked about meal planning, she reported none. She has not had a previous visit with a dietitian. She rarely participates in exercise. (It is difficult to get blood from her fingers. She is checking once a day. Sometimes her hands are blue, she gets up about 10a or 11a. She will check before taking her trilogy. ) She does not see a podiatrist.Eye exam current: scheduled to see eye doctor in June.  Hyperlipidemia This is a chronic problem. The current episode started more than 1 year ago. The problem is controlled. Recent lipid tests were reviewed and are variable. There are no known factors aggravating her hyperlipidemia. Current antihyperlipidemic treatment includes ezetimibe. There are no compliance problems.  Risk factors  for coronary artery disease include diabetes mellitus, dyslipidemia and a sedentary lifestyle.     Past Medical History:  Diagnosis Date   Arthritis    Asthma    COPD (chronic obstructive pulmonary disease) ([place])    Depression    Diabetes mellitus without complication ([place])    Diverticulitis    GERD (gastroesophageal reflux disease)    Hyperlipidemia    Hypertension 06/15/2016   left lung ca dx'd 2016   Thyroid nodule      Family History  Problem Relation Age of Onset   Renal cancer Brother    Diabetes Mother    Hypertension Mother    Renal Disease Mother    Healthy Father      Current Outpatient Medications:    albuterol (PROVENTIL) (2.5 MG/3ML) 0.083% nebulizer solution, Take 3 mLs (2.5 mg total) by nebulization every 6 (six) hours as needed for wheezing or shortness of breath., Disp: 75 mL, Rfl: 12   albuterol (VENTOLIN HFA) 108 (90 Base) MCG/ACT inhaler, TAKE 2 PUFFS BY MOUTH EVERY 6 HOURS AS NEEDED FOR WHEEZE OR SHORTNESS OF BREATH, Disp: 6.7 each, Rfl: 2   aspirin 81 MG tablet, Take 81 mg by mouth every evening. , Disp: , Rfl:    Blood Glucose Monitoring Suppl (TRUE METRIX AIR GLUCOSE METER) w/Device KIT, Use to check blood sugar 4 times a day. Dx code e11.65, Disp: 1 kit, Rfl: 2   Calcium Carbonate-Vitamin D 600-400 MG-UNIT tablet, Take 1 tablet by mouth daily. (Patient taking differently: Take 1 tablet by mouth daily. 600 mg of calcium and 500 units of  vitd), Disp:  , Rfl:    cycloSPORINE (RESTASIS) 0.05 % ophthalmic emulsion, 1 drop 2 (two) times daily., Disp: , Rfl:    donepezil (ARICEPT) 10 MG tablet, TAKE 1 TABLET BY MOUTH EVERY DAY IN THE EVENING, Disp: 90 tablet, Rfl: 1   empagliflozin (JARDIANCE) 10 MG TABS tablet, Take 1 tablet (10 mg total) by mouth daily before breakfast., Disp: 30 tablet, Rfl: 2   ezetimibe (ZETIA) 10 MG tablet, TAKE 1 TABLET BY MOUTH EVERYDAY AT BEDTIME, Disp: 90 tablet, Rfl: 1   flunisolide (NASALIDE) 25 MCG/ACT (0.025%) SOLN, PLACE 2 SPRAYS  INTO THE NOSE 2 (TWO) TIMES DAILY., Disp: 25 mL, Rfl: 1   Fluticasone-Umeclidin-Vilant (TRELEGY ELLIPTA) 100-62.5-25 MCG/ACT AEPB, INHALE 1 PUFF ONE TIME DAILY, Disp: 180 each, Rfl: 3   glucose blood (TRUE METRIX BLOOD GLUCOSE TEST) test strip, USE AS INSTRUCTED TO CHECK BLOOD SUGAR TWICE DAILY, Disp: 200 strip, Rfl: 2   KLOR-CON M20 20 MEQ tablet, TAKE 1 TABLET BY MOUTH EVERY DAY WITH FOOD, Disp: 90 tablet, Rfl: 1   magnesium oxide (MAG-OX) 400 MG tablet, Take 400 mg by mouth daily., Disp: , Rfl:    Melatonin 2.5 MG CAPS, Take 2.5 mg by mouth at bedtime as needed (sleep). , Disp: , Rfl:    mirtazapine (REMERON) 15 MG tablet, TAKE 1 TABLET BY MOUTH EVERYDAY AT BEDTIME, Disp: 90 tablet, Rfl: 1   Multiple Vitamin (MULTIVITAMIN) tablet, Take 1 tablet by mouth daily., Disp: , Rfl:    niacin (NIASPAN) 500 MG CR tablet, TAKE 1 TABLET BY MOUTH EVERY DAY IN THE EVENING, Disp: 90 tablet, Rfl: 3   OVER THE COUNTER MEDICATION, Take 1 tablet by mouth daily., Disp: , Rfl:    pantoprazole (PROTONIX) 40 MG tablet, TAKE 1 TABLET BY MOUTH TWICE A DAY, Disp: 180 tablet, Rfl: 1   Probiotic Product (PROBIOTIC PO), Take by mouth., Disp: , Rfl:    TRUEplus Lancets 33G MISC, TEST BLOOD SUGAR AS DIRECTED, Disp: 400 each, Rfl: 3   valACYclovir (VALTREX) 500 MG tablet, TAKE 1 TABLET (500 MG TOTAL) BY MOUTH DAILY., Disp: 90 tablet, Rfl: 0   White Petrolatum-Mineral Oil (SYSTANE NIGHTTIME OP), Place 1 drop into both eyes at bedtime as needed (for dry eyes). , Disp: , Rfl:    No Known Allergies   Review of Systems  Constitutional: Negative.  Negative for fatigue.  Respiratory: Negative.    Cardiovascular: Negative.   Endocrine: Negative for polydipsia, polyphagia and polyuria.  Neurological: Negative.  Negative for dizziness and headaches.  Psychiatric/Behavioral: Negative.       Today's Vitals   06/15/22 1422  BP: 118/82  Pulse: 98  Temp: 98.1 F (36.7 C)  SpO2: 93%  Weight: 161 lb 9.6 oz (73.3 kg)  Height:  $Remove'5\' 2"'bSAqMzJ$  (1.575 m)  PainSc: 0-No pain   Body mass index is 29.56 kg/m. . Wt Readings from Last 3 Encounters:  06/15/22 161 lb 9.6 oz (73.3 kg)  06/07/22 162 lb (73.5 kg)  06/01/22 161 lb 1.6 oz (73.1 kg)    Objective:  Physical Exam Vitals reviewed.  Constitutional:      General: She is not in acute distress.    Appearance: Normal appearance.  Cardiovascular:     Pulses: Normal pulses.     Heart sounds: Normal heart sounds. No murmur heard. Pulmonary:     Effort: Pulmonary effort is normal. No respiratory distress.     Breath sounds: No wheezing.     Comments: Continues to have some shortness of  breath however recovers well.  Skin:    General: Skin is warm and dry.  Neurological:     General: No focal deficit present.     Mental Status: She is alert and oriented to person, place, and time.     Cranial Nerves: No cranial nerve deficit.     Motor: No weakness.  Psychiatric:        Mood and Affect: Mood normal.        Behavior: Behavior normal.        Thought Content: Thought content normal.        Judgment: Judgment normal.         Assessment And Plan:     1. Diabetes mellitus with stage 3 chronic kidney disease ([place]) Comments: She is tolerating Jardiance well, encouraged to hold medication if vomiting or diarrhea.  - AMB Referral to [place]  2. Chronic obstructive pulmonary disease with acute exacerbation [facility]) Comments: Referral placed for Community care to help with disease managment and any other resources she may need.  - AMB Referral to [place]  3. Body mass index (BMI) 29.0-29.9, adult Goal to have BMI less than 25.   Patient was given opportunity to ask questions. Patient verbalized understanding of the plan and was able to repeat key elements of the plan. All questions were answered to their satisfaction.  [name], FNP   I, [name], FNP, have reviewed all documentation for this visit. The documentation on  06/15/22 for the exam, diagnosis, procedures, and orders are all accurate and complete.   IF YOU HAVE BEEN REFERRED TO A SPECIALIST, IT MAY TAKE 1-2 WEEKS TO SCHEDULE/PROCESS THE REFERRAL. IF YOU HAVE NOT HEARD FROM US/SPECIALIST IN TWO WEEKS, PLEASE GIVE [name] A CALL AT [phone].   THE PATIENT IS ENCOURAGED TO PRACTICE SOCIAL DISTANCING DUE TO THE COVID-19 PANDEMIC.

## 2022-06-15 NOTE — Patient Instructions (Signed)
Diabetes Mellitus Action Plan Following a diabetes action plan is a way for you to manage your diabetes (diabetes mellitus) symptoms. The plan is color-coded to help you understand what actions you need to take based on any symptoms you are having. If you have symptoms in the red zone, you need medical care right away. If you have symptoms in the yellow zone, you are having problems. If you have symptoms in the green zone, you are doing well. Learning about and understanding diabetes can take time. Follow the plan that you develop with your health care provider. Know the target range for your blood sugar (glucose) level, and review your treatment plan with your health care provider at each visit. The target range for my blood sugar level is __________________________ mg/dL. Red zone Get medical help right away if you have any of the following symptoms: A blood sugar test result that is below 54 mg/dL (3 mmol/L). A blood sugar test result that is at or above 240 mg/dL (13.3 mmol/L) for 2 days in a row. Confusion or trouble thinking clearly. Difficulty breathing. Sickness or a fever for 2 or more days that is not getting better. Moderate or large ketone levels in your urine. Feeling tired or having no energy. If you have any red zone symptoms, do not wait to see if the symptoms will go away. Get medical help right away. Call your local emergency services (911 in the [place] Do not drive yourself to the hospital. If you have severely low blood sugar (severe hypoglycemia) and you cannot eat or drink, you may need glucagon. Make sure a family member or close friend knows how to check your blood sugar and how to give you glucagon. You may need to be treated in a hospital for this condition. Yellow zone If you have any of the following symptoms, your diabetes is not under control and you may need to make some changes: A blood sugar test result that is at or above 240 mg/dL (13.3 mmol/L) for 2 days in a  row. Blood sugar test results that are below 70 mg/dL (3.9 mmol/L). Other symptoms of hypoglycemia, such as: Shaking or feeling light-headed. Confusion or irritability. Feeling hungry. Having a fast heartbeat. If you have any yellow zone symptoms: Treat your hypoglycemia by eating or drinking 15 grams of a rapid-acting carbohydrate. Follow the 15:15 rule: Take 15 grams of a rapid-acting carbohydrate, such as: 1 tube of glucose gel. 4 glucose pills. 4 oz (120 mL) of fruit juice. 4 oz (120 mL) of regular (not diet) soda. Check your blood sugar 15 minutes after you take the carbohydrate. If the repeat blood sugar test is still at or below 70 mg/dL (3.9 mmol/L), take 15 grams of a carbohydrate again. If your blood sugar does not increase above 70 mg/dL (3.9 mmol/L) after 3 tries, get medical help right away. After your blood sugar returns to normal, eat a meal or a snack within 1 hour. Keep taking your daily medicines as told by your health care provider. Check your blood sugar more often than you normally would. Write down your results. Call your health care provider if you have trouble keeping your blood sugar in your target range.  Green zone These signs mean you are doing well and you can continue what you are doing to manage your diabetes: Your blood sugar is within your personal target range. For most people, a blood sugar level before a meal (preprandial) should be 80-130 mg/dL (4.4-7.2 mmol/L). You feel   well, and you are able to do daily activities. If you are in the green zone, continue to manage your diabetes as told by your health care provider. To do this: Eat a healthy diet. Exercise regularly. Check your blood sugar as told by your health care provider. Take your medicines as told by your health care provider.  Where to find more information American Diabetes Association (ADA): diabetes.org Association of Diabetes Care & Education Specialists (ADCES):  diabeteseducator.org Summary Following a diabetes action plan is a way for you to manage your diabetes symptoms. The plan is color-coded to help you understand what actions you need to take based on any symptoms you are having. Follow the plan that you develop with your health care provider. Make sure you know your personal target blood sugar level. Review your treatment plan with your health care provider at each visit. This information is not intended to replace advice given to you by your health care provider. Make sure you discuss any questions you have with your health care provider. Document Revised: 02/28/2020 Document Reviewed: 02/28/2020 Elsevier Patient Education  2023 Elsevier Inc.  

## 2022-06-16 ENCOUNTER — Telehealth: Payer: Self-pay | Admitting: *Deleted

## 2022-06-16 NOTE — Chronic Care Management (AMB) (Signed)
  Care Coordination   Note   06/16/2022 Name: [name] MRN: [id number] DOB: 12-10-1940  [name] is a 81 y.o. year old female who sees [name], [place] for primary care. I reached out to [name] by phone today to offer care coordination services.  [name] was given information about Care Coordination services today including:   The Care Coordination services include support from the care team which includes your Nurse Coordinator, Clinical Social Worker, or Pharmacist.  The Care Coordination team is here to help remove barriers to the health concerns and goals most important to you. Care Coordination services are voluntary, and the patient may decline or stop services at any time by request to their care team member.   Care Coordination Consent Status: Patient agreed to services and verbal consent obtained.   Follow up plan:  Telephone appointment with care coordination team member scheduled for:  06/25/2022  Encounter Outcome:  Pt. Scheduled from referral

## 2022-06-25 ENCOUNTER — Ambulatory Visit: Payer: Self-pay

## 2022-06-25 NOTE — Patient Outreach (Signed)
  Care Coordination   Initial Visit Note   06/25/2022 Name: TEPHANIE ESCORCIA MRN: [id number] DOB: 20-Sep-1940  Gae Gallop is a 81 y.o. year old female who sees [name], [place] for primary care. I spoke with  Gae Gallop by phone today.  What matters to the patients health and wellness today?  Patient would like to work on lowering her A1c.     Goals Addressed               This Visit's Progress     Patient Stated     I would like to work on lower my A1c (pt-stated)        Care Coordination Interventions: Provided education to patient about basic DM disease process Reviewed medications with patient and discussed importance of medication adherence Counseled on importance of regular laboratory monitoring as prescribed Advised patient, providing education and rationale, to check cbg daily before meals and at bedtime and record, calling PCP for findings outside established parameters Review of patient status, including review of consultants reports, relevant laboratory and other test results, and medications completed          SDOH assessments and interventions completed:  Yes  SDOH Interventions Today    Flowsheet Row Most Recent Value  SDOH Interventions   Transportation Interventions Intervention Not Indicated        Care Coordination Interventions Activated:  Yes  Care Coordination Interventions:  Yes, provided   Follow up plan: Follow up call scheduled for 08/25/22 @1 :30 PM    Encounter Outcome:  Pt. Visit Completed

## 2022-06-25 NOTE — Patient Instructions (Signed)
Visit Information  Thank you for taking time to visit with me today. Please don't hesitate to contact me if I can be of assistance to you.   Following are the goals we discussed today:   Goals Addressed               This Visit's Progress     Patient Stated     I would like to work on lower my A1c (pt-stated)        Care Coordination Interventions: Provided education to patient about basic DM disease process Reviewed medications with patient and discussed importance of medication adherence Counseled on importance of regular laboratory monitoring as prescribed Advised patient, providing education and rationale, to check cbg daily before meals and at bedtime and record, calling PCP for findings outside established parameters Review of patient status, including review of consultants reports, relevant laboratory and other test results, and medications completed          Our next appointment is by telephone on 08/25/22 at 1:30 PM  Please call the care guide team at [phone] if you need to cancel or reschedule your appointment.   If you are experiencing a Mental Health or [place] or need someone to talk to, please call 1-800-273-TALK (toll free, 24 hour hotline) go to [facility] [place] [phone])  The patient verbalized understanding of instructions, educational materials, and care plan provided today and agreed to receive a mailed copy of patient instructions, educational materials, and care plan.   Barb Merino, RN, BSN, CCM Care Management Coordinator [facility] Care Management Direct Phone: [phone]

## 2022-07-05 ENCOUNTER — Ambulatory Visit: Payer: Self-pay

## 2022-07-24 ENCOUNTER — Other Ambulatory Visit: Payer: Self-pay | Admitting: Nurse Practitioner

## 2022-07-25 ENCOUNTER — Other Ambulatory Visit: Payer: Self-pay | Admitting: Nurse Practitioner

## 2022-07-26 ENCOUNTER — Other Ambulatory Visit: Payer: Self-pay | Admitting: Nurse Practitioner

## 2022-07-26 DIAGNOSIS — E1122 Type 2 diabetes mellitus with diabetic chronic kidney disease: Secondary | ICD-10-CM

## 2022-08-04 DIAGNOSIS — Z1231 Encounter for screening mammogram for malignant neoplasm of breast: Secondary | ICD-10-CM | POA: Diagnosis not present

## 2022-08-04 LAB — HM MAMMOGRAPHY: HM Mammogram: NORMAL (ref 0–4)

## 2022-08-05 NOTE — Chronic Care Management (AMB) (Signed)
Error Please Disregard

## 2022-08-07 ENCOUNTER — Other Ambulatory Visit: Payer: Self-pay | Admitting: Nurse Practitioner

## 2022-08-15 ENCOUNTER — Other Ambulatory Visit: Payer: Self-pay | Admitting: Pulmonary Disease

## 2022-08-16 ENCOUNTER — Other Ambulatory Visit: Payer: Self-pay | Admitting: Pulmonary Disease

## 2022-08-16 DIAGNOSIS — J441 Chronic obstructive pulmonary disease with (acute) exacerbation: Secondary | ICD-10-CM

## 2022-08-25 ENCOUNTER — Other Ambulatory Visit: Payer: Self-pay | Admitting: Nurse Practitioner

## 2022-09-02 ENCOUNTER — Encounter: Payer: Medicare HMO | Admitting: Nurse Practitioner

## 2022-09-14 ENCOUNTER — Ambulatory Visit: Payer: Self-pay

## 2022-09-14 NOTE — Patient Instructions (Signed)
Visit Information  Thank you for taking time to visit with me today. Please don't hesitate to contact me if I can be of assistance to you.   Following are the goals we discussed today:   Goals Addressed               This Visit's Progress     Patient Stated     I am worried about my appetite (pt-stated)        Care Coordination Interventions: Evaluation of current treatment plan related to nutritional imbalance and patient's adherence to plan as established by provider Determined patient is concerned about her appetite, she is only eating 1 meal a day but states she is force feeding  Assessed for weight loss, patient denies  Educated patient regarding the importance to eat small frequent meals  Educated patient regarding potential complications related to nutritional imbalance Encouraged patient to discuss her change in appetite with her PCP at next scheduled visit Reviewed upcoming scheduled follow up visit with PCP [name] FNP set for 10/18/22 2:20 PM  Mailed printed educational materials related to Elderly Nutrition          I would like to work on lower my A1c (pt-stated)        Care Coordination Interventions: Provided education to patient about basic DM disease process Reviewed medications with patient and discussed importance of medication adherence Advised patient, providing education and rationale, to check cbg daily before meals and at bedtime and record, calling PCP for findings outside established parameters Educated patient regarding target A1c <7.0%; educated patient regarding daily glycemic control  Educated patient regarding the importance of doing daily foot inspections and to notify PCP promptly for changes or concerns noted to feet  Confirmed patient is up to date with her eye exam  Educated patient regarding the dangers in skipping meals            Our next appointment is by telephone on 10/20/22 at 1:30 PM  Please call the care guide team at  [phone] if you need to cancel or reschedule your appointment.   If you are experiencing a Mental Health or [place] or need someone to talk to, please go to [facility] [place] [phone])  The patient verbalized understanding of instructions, educational materials, and care plan provided today and agreed to receive a mailed copy of patient instructions, educational materials, and care plan.   Barb Merino, RN, BSN, CCM Care Management Coordinator [facility] Care Management Direct Phone: [phone]

## 2022-09-14 NOTE — Patient Outreach (Signed)
  Care Coordination   Follow Up Visit Note   09/14/2022 Name: [name] MRN: [id number] DOB: December 19, 1940  [name] is a 82 y.o. year old female who sees [name], [place] for primary care. I spoke with  [name] by phone today.  What matters to the patients health and wellness today?  Patient is concerned about her lack of appetite. She will continue to adhere to her diabetes regimen.     Goals Addressed               This Visit's Progress     Patient Stated     I am worried about my appetite (pt-stated)        Care Coordination Interventions: Evaluation of current treatment plan related to nutritional imbalance and patient's adherence to plan as established by provider Determined patient is concerned about her appetite, she is only eating 1 meal a day but states she is force feeding  Assessed for weight loss, patient denies  Educated patient regarding the importance to eat small frequent meals  Educated patient regarding potential complications related to nutritional imbalance Encouraged patient to discuss her change in appetite with her PCP at next scheduled visit Reviewed upcoming scheduled follow up visit with PCP [name] FNP set for 10/18/22 2:20 PM  Mailed printed educational materials related to Elderly Nutrition          I would like to work on lower my A1c (pt-stated)        Care Coordination Interventions: Provided education to patient about basic DM disease process Reviewed medications with patient and discussed importance of medication adherence Advised patient, providing education and rationale, to check cbg daily before meals and at bedtime and record, calling PCP for findings outside established parameters Educated patient regarding target A1c <7.0%; educated patient regarding daily glycemic control  Educated patient regarding the importance of doing daily foot inspections and to notify PCP promptly for changes or concerns noted to feet   Confirmed patient is up to date with her eye exam  Educated patient regarding the dangers in skipping meals            SDOH assessments and interventions completed:  No     Care Coordination Interventions:  Yes, provided   Follow up plan: Follow up call scheduled for 10/20/22 1:30 PM    Encounter Outcome:  Pt. Visit Completed

## 2022-09-30 ENCOUNTER — Ambulatory Visit (INDEPENDENT_AMBULATORY_CARE_PROVIDER_SITE_OTHER): Payer: Medicare HMO

## 2022-09-30 VITALS — Ht 62.0 in | Wt 161.8 lb

## 2022-09-30 DIAGNOSIS — Z0000 Encounter for general adult medical examination without abnormal findings: Secondary | ICD-10-CM | POA: Diagnosis not present

## 2022-09-30 NOTE — Patient Instructions (Signed)
[name] , Thank you for taking time to come for your Medicare Wellness Visit. I appreciate your ongoing commitment to your health goals. Please review the following plan we discussed and let me know if I can assist you in the future.   These are the goals we discussed:  Goals       Have 2 meals a day (pt-stated)      Wants to eat more healthy. Has been nibbling through out the day. Has only been eating 1 meal a day twice or three times a week.      I am worried about my appetite (pt-stated)      Care Coordination Interventions: Evaluation of current treatment plan related to nutritional imbalance and patient's adherence to plan as established by provider Determined patient is concerned about her appetite, she is only eating 1 meal a day but states she is force feeding  Assessed for weight loss, patient denies  Educated patient regarding the importance to eat small frequent meals  Educated patient regarding potential complications related to nutritional imbalance Encouraged patient to discuss her change in appetite with her PCP at next scheduled visit Reviewed upcoming scheduled follow up visit with PCP [name] FNP set for 10/18/22 2:20 PM  Mailed printed educational materials related to Elderly Nutrition          I would like to work on lower my A1c (pt-stated)      Care Coordination Interventions: Provided education to patient about basic DM disease process Reviewed medications with patient and discussed importance of medication adherence Advised patient, providing education and rationale, to check cbg daily before meals and at bedtime and record, calling PCP for findings outside established parameters Educated patient regarding target A1c <7.0%; educated patient regarding daily glycemic control  Educated patient regarding the importance of doing daily foot inspections and to notify PCP promptly for changes or concerns noted to feet  Confirmed patient is up to date with her eye  exam  Educated patient regarding the dangers in skipping meals          Patient Stated      05/03/2019, not to have any new health issues      Patient Stated      02/05/2021, manage A1C      Patient Stated      09/23/2021, wants to eat healthier and regularly      Patient Stated      09/30/2022, wants to get blood sugar under control and lose weight      Weight (lb) < 200 lb (90.7 kg)      12/27/2019, wants to weigh 140 pounds        This is a list of the screening recommended for you and due dates:  Health Maintenance  Topic Date Due   DTaP/Tdap/Td vaccine (1 - Tdap) Never done   Zoster (Shingles) Vaccine (1 of 2) Never done   COVID-19 Vaccine (3 - Moderna risk series) 10/08/2020   DEXA scan (bone density measurement)  07/23/2022   Yearly kidney health urinalysis for diabetes  08/24/2022   Hemoglobin A1C  11/03/2022   Eye exam for diabetics  02/26/2023   Yearly kidney function blood test for diabetes  05/29/2023   Complete foot exam   06/16/2023   Mammogram  08/05/2023   Medicare Annual Wellness Visit  10/01/2023   Pneumonia Vaccine  Completed   Flu Shot  Completed   HPV Vaccine  Aged Out    Advanced directives: Advance directive  discussed with you today.   Conditions/risks identified: none  Next appointment: Follow up in one year for your annual wellness visit    Preventive Care 65 Years and Older, Female Preventive care refers to lifestyle choices and visits with your health care provider that can promote health and wellness. What does preventive care include? A yearly physical exam. This is also called an annual well check. Dental exams once or twice a year. Routine eye exams. Ask your health care provider how often you should have your eyes checked. Personal lifestyle choices, including: Daily care of your teeth and gums. Regular physical activity. Eating a healthy diet. Avoiding tobacco and drug use. Limiting alcohol use. Practicing safe sex. Taking  low-dose aspirin every day. Taking vitamin and mineral supplements as recommended by your health care provider. What happens during an annual well check? The services and screenings done by your health care provider during your annual well check will depend on your age, overall health, lifestyle risk factors, and family history of disease. Counseling  Your health care provider may ask you questions about your: Alcohol use. Tobacco use. Drug use. Emotional well-being. Home and relationship well-being. Sexual activity. Eating habits. History of falls. Memory and ability to understand (cognition). Work and work Statistician. Reproductive health. Screening  You may have the following tests or measurements: Height, weight, and BMI. Blood pressure. Lipid and cholesterol levels. These may be checked every 5 years, or more frequently if you are over 42 years old. Skin check. Lung cancer screening. You may have this screening every year starting at age 78 if you have a 30-pack-year history of smoking and currently smoke or have quit within the past 15 years. Fecal occult blood test (FOBT) of the stool. You may have this test every year starting at age 29. Flexible sigmoidoscopy or colonoscopy. You may have a sigmoidoscopy every 5 years or a colonoscopy every 10 years starting at age 21. Hepatitis C blood test. Hepatitis B blood test. Sexually transmitted disease (STD) testing. Diabetes screening. This is done by checking your blood sugar (glucose) after you have not eaten for a while (fasting). You may have this done every 1-3 years. Bone density scan. This is done to screen for osteoporosis. You may have this done starting at age 45. Mammogram. This may be done every 1-2 years. Talk to your health care provider about how often you should have regular mammograms. Talk with your health care provider about your test results, treatment options, and if necessary, the need for more tests. Vaccines   Your health care provider may recommend certain vaccines, such as: Influenza vaccine. This is recommended every year. Tetanus, diphtheria, and acellular pertussis (Tdap, Td) vaccine. You may need a Td booster every 10 years. Zoster vaccine. You may need this after age 67. Pneumococcal 13-valent conjugate (PCV13) vaccine. One dose is recommended after age 1. Pneumococcal polysaccharide (PPSV23) vaccine. One dose is recommended after age 25. Talk to your health care provider about which screenings and vaccines you need and how often you need them. This information is not intended to replace advice given to you by your health care provider. Make sure you discuss any questions you have with your health care provider. Document Released: 09/19/2015 Document Revised: 05/12/2016 Document Reviewed: 06/24/2015 Elsevier Interactive Patient Education  2017 [place] [employer] Prevention in the Home Falls can cause injuries. They can happen to people of all ages. There are many things you can do to make your home safe and to help  prevent falls. What can I do on the outside of my home? Regularly fix the edges of walkways and driveways and fix any cracks. Remove anything that might make you trip as you walk through a door, such as a raised step or threshold. Trim any bushes or trees on the path to your home. Use bright outdoor lighting. Clear any walking paths of anything that might make someone trip, such as rocks or tools. Regularly check to see if handrails are loose or broken. Make sure that both sides of any steps have handrails. Any raised decks and porches should have guardrails on the edges. Have any leaves, snow, or ice cleared regularly. Use sand or salt on walking paths during winter. Clean up any spills in your garage right away. This includes oil or grease spills. What can I do in the bathroom? Use night lights. Install grab bars by the toilet and in the tub and shower. Do not use towel  bars as grab bars. Use non-skid mats or decals in the tub or shower. If you need to sit down in the shower, use a plastic, non-slip stool. Keep the floor dry. Clean up any water that spills on the floor as soon as it happens. Remove soap buildup in the tub or shower regularly. Attach bath mats securely with double-sided non-slip rug tape. Do not have throw rugs and other things on the floor that can make you trip. What can I do in the bedroom? Use night lights. Make sure that you have a light by your bed that is easy to reach. Do not use any sheets or blankets that are too big for your bed. They should not hang down onto the floor. Have a firm chair that has side arms. You can use this for support while you get dressed. Do not have throw rugs and other things on the floor that can make you trip. What can I do in the kitchen? Clean up any spills right away. Avoid walking on wet floors. Keep items that you use a lot in easy-to-reach places. If you need to reach something above you, use a strong step stool that has a grab bar. Keep electrical cords out of the way. Do not use floor polish or wax that makes floors slippery. If you must use wax, use non-skid floor wax. Do not have throw rugs and other things on the floor that can make you trip. What can I do with my stairs? Do not leave any items on the stairs. Make sure that there are handrails on both sides of the stairs and use them. Fix handrails that are broken or loose. Make sure that handrails are as long as the stairways. Check any carpeting to make sure that it is firmly attached to the stairs. Fix any carpet that is loose or worn. Avoid having throw rugs at the top or bottom of the stairs. If you do have throw rugs, attach them to the floor with carpet tape. Make sure that you have a light switch at the top of the stairs and the bottom of the stairs. If you do not have them, ask someone to add them for you. What else can I do to help  prevent falls? Wear shoes that: Do not have high heels. Have rubber bottoms. Are comfortable and fit you well. Are closed at the toe. Do not wear sandals. If you use a stepladder: Make sure that it is fully opened. Do not climb a closed stepladder. Make sure that  both sides of the stepladder are locked into place. Ask someone to hold it for you, if possible. Clearly mark and make sure that you can see: Any grab bars or handrails. First and last steps. Where the edge of each step is. Use tools that help you move around (mobility aids) if they are needed. These include: Canes. Walkers. Scooters. Crutches. Turn on the lights when you go into a dark area. Replace any light bulbs as soon as they burn out. Set up your furniture so you have a clear path. Avoid moving your furniture around. If any of your floors are uneven, fix them. If there are any pets around you, be aware of where they are. Review your medicines with your doctor. Some medicines can make you feel dizzy. This can increase your chance of falling. Ask your doctor what other things that you can do to help prevent falls. This information is not intended to replace advice given to you by your health care provider. Make sure you discuss any questions you have with your health care provider. Document Released: 06/19/2009 Document Revised: 01/29/2016 Document Reviewed: 09/27/2014 Elsevier Interactive Patient Education  2017 [employer].

## 2022-09-30 NOTE — Progress Notes (Signed)
I connected with [name] today by telephone and verified that I am speaking with the correct person using two identifiers. Location patient: home Location provider: work Persons participating in the virtual visit: [name], [place] LPN.   I discussed the limitations, risks, security and privacy concerns of performing an evaluation and management service by telephone and the availability of in person appointments. I also discussed with the patient that there may be a patient responsible charge related to this service. The patient expressed understanding and verbally consented to this telephonic visit.    Interactive audio and video telecommunications were attempted between this provider and patient, however failed, due to patient having technical difficulties OR patient did not have access to video capability.  We continued and completed visit with audio only.     Vital signs may be patient reported or missing.  Subjective:   [name] is a 82 y.o. female who presents for Medicare Annual (Subsequent) preventive examination.  Review of Systems     Cardiac Risk Factors include: advanced age (>61men, >21 women);diabetes mellitus;dyslipidemia;hypertension     Objective:    Today's Vitals   09/30/22 1036  Weight: 161 lb 12.8 oz (73.4 kg)  Height: 5\' 2"  (1.575 m)   Body mass index is 29.59 kg/m.     09/30/2022   10:41 AM 09/23/2021   11:19 AM 03/04/2021    2:07 PM 02/05/2021    2:59 PM 05/28/2020   10:40 AM 12/27/2019   11:09 AM 05/03/2019    3:54 PM  Advanced Directives  Does Patient Have a Medical Advance Directive? No No No No No No No  Would patient like information on creating a medical advance directive?  No - Patient declined No - Patient declined No - Patient declined No - Patient declined No - Guardian declined     Current Medications (verified) Outpatient Encounter Medications as of 09/30/2022  Medication Sig   albuterol (PROVENTIL) (2.5 MG/3ML)  0.083% nebulizer solution Take 3 mLs (2.5 mg total) by nebulization every 6 (six) hours as needed for wheezing or shortness of breath.   albuterol (VENTOLIN HFA) 108 (90 Base) MCG/ACT inhaler TAKE 2 PUFFS BY MOUTH EVERY 6 HOURS AS NEEDED FOR WHEEZE OR SHORTNESS OF BREATH   aspirin 81 MG tablet Take 81 mg by mouth every evening.    Blood Glucose Monitoring Suppl (TRUE METRIX AIR GLUCOSE METER) w/Device KIT Use to check blood sugar 4 times a day. Dx code e11.65   Calcium Carbonate-Vitamin D 600-400 MG-UNIT tablet Take 1 tablet by mouth daily. (Patient taking differently: Take 1 tablet by mouth daily. 600 mg of calcium and 500 units of vitd)   cycloSPORINE (RESTASIS) 0.05 % ophthalmic emulsion 1 drop 2 (two) times daily.   donepezil (ARICEPT) 10 MG tablet TAKE 1 TABLET BY MOUTH EVERY DAY IN THE EVENING   ezetimibe (ZETIA) 10 MG tablet TAKE 1 TABLET BY MOUTH EVERYDAY AT BEDTIME   glucose blood (TRUE METRIX BLOOD GLUCOSE TEST) test strip USE AS INSTRUCTED TO CHECK BLOOD SUGAR TWICE DAILY   JARDIANCE 10 MG TABS tablet TAKE 1 TABLET BY MOUTH DAILY BEFORE BREAKFAST.   KLOR-CON M20 20 MEQ tablet TAKE 1 TABLET BY MOUTH EVERY DAY WITH FOOD   magnesium oxide (MAG-OX) 400 MG tablet Take 400 mg by mouth daily.   Melatonin 2.5 MG CAPS Take 2.5 mg by mouth at bedtime as needed (sleep).    mirtazapine (REMERON) 15 MG tablet TAKE 1 TABLET BY MOUTH EVERYDAY AT BEDTIME  Multiple Vitamin (MULTIVITAMIN) tablet Take 1 tablet by mouth daily.   niacin (NIASPAN) 500 MG CR tablet TAKE 1 TABLET BY MOUTH EVERY DAY IN THE EVENING   OVER THE COUNTER MEDICATION Take 1 tablet by mouth daily.   pantoprazole (PROTONIX) 40 MG tablet TAKE 1 TABLET BY MOUTH TWICE A DAY   Probiotic Product (PROBIOTIC PO) Take by mouth.   TRELEGY ELLIPTA 100-62.5-25 MCG/ACT AEPB INHALE 1 PUFF ONE TIME DAILY   TRUEplus Lancets 33G MISC TEST BLOOD SUGAR AS DIRECTED   valACYclovir (VALTREX) 500 MG tablet TAKE 1 TABLET (500 MG TOTAL) BY MOUTH DAILY.    White Petrolatum-Mineral Oil (SYSTANE NIGHTTIME OP) Place 1 drop into both eyes at bedtime as needed (for dry eyes).    flunisolide (NASALIDE) 25 MCG/ACT (0.025%) SOLN PLACE 2 SPRAYS INTO THE NOSE 2 (TWO) TIMES DAILY. (Patient not taking: Reported on 09/30/2022)   No facility-administered encounter medications on file as of 09/30/2022.    Allergies (verified) Patient has no known allergies.   History: Past Medical History:  Diagnosis Date   Arthritis    Asthma    COPD (chronic obstructive pulmonary disease) ([place])    Depression    Diabetes mellitus without complication ([place])    Diverticulitis    GERD (gastroesophageal reflux disease)    Hyperlipidemia    Hypertension 06/15/2016   left lung ca dx'd 2016   Thyroid nodule    Past Surgical History:  Procedure Laterality Date   BIOPSY THYROID     CATARACT EXTRACTION     left eye   CATARACT EXTRACTION W/ INTRAOCULAR LENS IMPLANT     RIGHT   CESAREAN SECTION     EYE SURGERY     MASS EXCISION N/A 12/13/2014   Procedure: EXCISION OF VOCAL CORD POLYPS;  Surgeon: Jerrell Belfast, MD;  Location: [facility];  Service: ENT;  Laterality: N/A;   MICROLARYNGOSCOPY N/A 12/13/2014   Procedure: MICROLARYNGOSCOPY;  Surgeon: Jerrell Belfast, MD;  Location: [facility];  Service: ENT;  Laterality: N/A;   TUBAL LIGATION     VIDEO ASSISTED THORACOSCOPY (VATS)/ LOBECTOMY Left 07/04/2015   Procedure: VIDEO ASSISTED THORACOSCOPY (VATS) LEFT UPPER LOBECTOMY;  Surgeon: Melrose Nakayama, MD;  Location: [facility];  Service: Thoracic;  Laterality: Left;   VIDEO BRONCHOSCOPY WITH INSERTION OF INTERBRONCHIAL VALVE (IBV) N/A 07/11/2015   Procedure: VIDEO BRONCHOSCOPY WITH INSERTION OF INTERBRONCHIAL VALVE (IBV);  Surgeon: Melrose Nakayama, MD;  Location: [facility];  Service: Thoracic;  Laterality: N/A;   VIDEO BRONCHOSCOPY WITH INSERTION OF INTERBRONCHIAL VALVE (IBV) N/A 09/10/2015   Procedure: VIDEO BRONCHOSCOPY WITH REMOVAL OF INTERBRONCHIAL VALVES;  Surgeon: Melrose Nakayama, MD;  Location: [facility];  Service: Thoracic;  Laterality: N/A;   Family History  Problem Relation Age of Onset   Renal cancer Brother    Diabetes Mother    Hypertension Mother    Renal Disease Mother    Healthy Father    Social History   Socioeconomic History   Marital status: Widowed    Spouse name: Not on file   Number of children: 5   Years of education: Not on file   Highest education level: Not on file  Occupational History   Occupation: retired  Tobacco Use   Smoking status: Former    Packs/day: 1.00    Years: 56.00    Total pack years: 56.00    Types: Cigarettes    Quit date: 06/22/2015    Years since quitting: 7.2   Smokeless tobacco: Never  Vaping  Use   Vaping Use: Never used  Substance and Sexual Activity   Alcohol use: No    Alcohol/week: 0.0 standard drinks of alcohol   Drug use: No   Sexual activity: Not Currently  Other Topics Concern   Not on file  Social History Narrative   Not on file   Social Determinants of Health   Financial Resource Strain: Low Risk  (09/30/2022)   Overall Financial Resource Strain (CARDIA)    Difficulty of Paying Living Expenses: Not hard at all  Food Insecurity: No Food Insecurity (09/30/2022)   Hunger Vital Sign    Worried About Running Out of Food in the Last Year: Never true    Ran Out of Food in the Last Year: Never true  Transportation Needs: No Transportation Needs (09/30/2022)   PRAPARE - Hydrologist (Medical): No    Lack of Transportation (Non-Medical): No  Physical Activity: Inactive (09/30/2022)   Exercise Vital Sign    Days of Exercise per Week: 0 days    Minutes of Exercise per Session: 0 min  Stress: No Stress Concern Present (09/30/2022)   [place]    Feeling of Stress : Not at all  Social Connections: Not on file    Tobacco Counseling Counseling given: Not Answered   Clinical  Intake:  Pre-visit preparation completed: Yes  Pain : No/denies pain     Nutritional Status: BMI 25 -29 Overweight Nutritional Risks: None Diabetes: No  How often do you need to have someone help you when you read instructions, pamphlets, or other written materials from your doctor or pharmacy?: 1 - Never  Diabetic? Yes Nutrition Risk Assessment:  Has the patient had any N/V/D within the last 2 months?  No  Does the patient have any non-healing wounds?  No  Has the patient had any unintentional weight loss or weight gain?  No   Diabetes:  Is the patient diabetic?  Yes  If diabetic, was a CBG obtained today?  No  Did the patient bring in their glucometer from home?  No  How often do you monitor your CBG's? 3-4 week.   Financial Strains and Diabetes Management:  Are you having any financial strains with the device, your supplies or your medication? No .  Does the patient want to be seen by Chronic Care Management for management of their diabetes?  No  Would the patient like to be referred to a Nutritionist or for Diabetic Management?  No   Diabetic Exams:  Diabetic Eye Exam: Completed 02/25/2022 Diabetic Foot Exam: Completed 06/15/2022   Interpreter Needed?: No  Information entered by :: NAllen LPN   Activities of Daily Living    09/30/2022   10:41 AM  In your present state of health, do you have any difficulty performing the following activities:  Hearing? 0  Vision? 0  Difficulty concentrating or making decisions? 0  Walking or climbing stairs? 0  Dressing or bathing? 0  Doing errands, shopping? 0  Preparing Food and eating ? N  Using the Toilet? N  In the past six months, have you accidently leaked urine? N  Do you have problems with loss of bowel control? N  Managing your Medications? N  Managing your Finances? N  Housekeeping or managing your Housekeeping? N    Patient Care Team: [name], FNP as PCP - General (General Practice) Monna Fam, MD as Consulting Physician (Ophthalmology) Rigoberto Noel, MD as  Consulting Physician (Pulmonary Disease) Melrose Nakayama, MD as Consulting Physician (Cardiothoracic Surgery) Curt Bears, MD as Consulting Physician (Oncology) Rex Kras, Claudette Stapler, RN as [place] any recent [place] you may have received from other than [facility] providers in the past year (date may be approximate).     Assessment:   This is a routine wellness examination for [facility].  Hearing/Vision screen Vision Screening - Comments:: Regular eye exams, [facility]  Dietary issues and exercise activities discussed: Current Exercise Habits: The patient does not participate in regular exercise at present   Goals Addressed             This Visit's Progress    Patient Stated       09/30/2022, wants to get blood sugar under control and lose weight       Depression Screen    09/30/2022   10:41 AM 05/03/2022   11:34 AM 09/23/2021   11:20 AM 03/04/2021    2:07 PM 02/05/2021    2:59 PM 01/01/2021   10:31 AM 12/27/2019   11:10 AM  PHQ 2/9 Scores  PHQ - 2 Score 0 0 0 0 0 0 0  PHQ- 9 Score       2    Fall Risk    09/30/2022   10:41 AM 06/15/2022    2:18 PM 05/03/2022   11:34 AM 09/23/2021   11:19 AM 03/04/2021    2:07 PM  [place] in the past year? 0 0 0 1 0  Comment    misstepped   Number falls in past yr: 0 0 0 1   Injury with Fall? 0 0 0 0   Risk for fall due to : Medication side effect No Fall Risks No Fall Risks Medication side effect   Follow up Falls prevention discussed;Education provided;Falls evaluation completed Falls evaluation completed Falls evaluation completed Falls evaluation completed;Education provided;Falls prevention discussed     FALL RISK PREVENTION PERTAINING TO THE HOME:  Any stairs in or around the home? Yes  If so, are there any without handrails? Yes  Home free of loose throw rugs in walkways, pet beds,  electrical cords, etc? Yes  Adequate lighting in your home to reduce risk of falls? Yes   ASSISTIVE DEVICES UTILIZED TO PREVENT FALLS:  Life alert? No  Use of a cane, walker or w/c? No  Grab bars in the bathroom? Yes  Shower chair or bench in shower? Yes  Elevated toilet seat or a handicapped toilet? Yes   TIMED UP AND GO:  Was the test performed? No .      Cognitive Function:        09/30/2022   10:43 AM 09/23/2021   11:23 AM 02/05/2021    3:02 PM 12/27/2019   11:13 AM 05/03/2019    3:59 PM  6CIT Screen  What Year? 0 points 0 points 0 points 0 points 0 points  What month? 0 points 0 points 0 points 0 points 0 points  What time? 0 points 0 points 3 points 3 points 0 points  Count back from 20 0 points 0 points 0 points 0 points 0 points  Months in reverse 0 points 0 points 0 points 0 points 0 points  Repeat phrase 4 points 2 points 6 points 0 points 0 points  Total Score 4 points 2 points 9 points 3 points 0 points    Immunizations Immunization History  Administered Date(s) Administered  Fluad Quad(high Dose 65+) 07/01/2020, 08/17/2021, 05/20/2022   Influenza, High Dose Seasonal PF 06/06/2016, 05/01/2017, 05/31/2018, 06/28/2019   Influenza, Quadrivalent, Recombinant, Inj, Pf 06/03/2019   Influenza-Unspecified 05/01/2017   Moderna SARS-COV2 Booster Vaccination 09/10/2020   Moderna Sars-Covid-2 Vaccination 10/19/2019, 11/16/2019   Pneumococcal Conjugate-13 07/17/2014, 06/15/2018   Pneumococcal Polysaccharide-23 09/07/2011    TDAP status: Due, Education has been provided regarding the importance of this vaccine. Advised may receive this vaccine at local pharmacy or Health Dept. Aware to provide a copy of the vaccination record if obtained from local pharmacy or Health Dept. Verbalized acceptance and understanding.  Flu Vaccine status: Up to date  Pneumococcal vaccine status: Up to date  Covid-19 vaccine status: Completed vaccines  Qualifies for Shingles Vaccine? Yes    Zostavax completed No   Shingrix Completed?: No.    Education has been provided regarding the importance of this vaccine. Patient has been advised to call insurance company to determine out of pocket expense if they have not yet received this vaccine. Advised may also receive vaccine at local pharmacy or Health Dept. Verbalized acceptance and understanding.  Screening Tests Health Maintenance  Topic Date Due   DTaP/Tdap/Td (1 - Tdap) Never done   Zoster Vaccines- Shingrix (1 of 2) Never done   COVID-19 Vaccine (3 - Moderna risk series) 10/08/2020   DEXA SCAN  07/23/2022   Diabetic kidney evaluation - Urine ACR  08/24/2022   Medicare Annual Wellness (AWV)  09/23/2022   HEMOGLOBIN A1C  11/03/2022   OPHTHALMOLOGY EXAM  02/26/2023   Diabetic kidney evaluation - eGFR measurement  05/29/2023   FOOT EXAM  06/16/2023   MAMMOGRAM  08/05/2023   Pneumonia Vaccine 11+ Years old  Completed   INFLUENZA VACCINE  Completed   HPV VACCINES  Aged Out    Health Maintenance  Health Maintenance Due  Topic Date Due   DTaP/Tdap/Td (1 - Tdap) Never done   Zoster Vaccines- Shingrix (1 of 2) Never done   COVID-19 Vaccine (3 - Moderna risk series) 10/08/2020   DEXA SCAN  07/23/2022   Diabetic kidney evaluation - Urine ACR  08/24/2022   Medicare Annual Wellness (AWV)  09/23/2022    Colorectal cancer screening: No longer required.   Mammogram status: Completed 08/04/2022. Repeat every year  Bone Density status: Completed 07/23/2020.   Lung Cancer Screening: (Low Dose CT Chest recommended if Age 44-80 years, 30 pack-year currently smoking OR have quit w/in 15years.) does not qualify.   Lung Cancer Screening Referral: no  Additional Screening:  Hepatitis C Screening: does not qualify;   Vision Screening: Recommended annual ophthalmology exams for early detection of glaucoma and other disorders of the eye. Is the patient up to date with their annual eye exam?  Yes  Who is the provider or what is  the name of the office in which the patient attends annual eye exams? [facility] If pt is not established with a provider, would they like to be referred to a provider to establish care? No .   Dental Screening: Recommended annual dental exams for proper oral hygiene  Community Resource Referral / Chronic Care Management: CRR required this visit?  No   CCM required this visit?  No      Plan:     I have personally reviewed and noted the following in the patient's chart:   Medical and social history Use of alcohol, tobacco or illicit drugs  Current medications and supplements including opioid prescriptions. Patient is not currently taking opioid  prescriptions. Functional ability and status Nutritional status Physical activity Advanced directives List of other physicians Hospitalizations, surgeries, and ER visits in previous 12 months Vitals Screenings to include cognitive, depression, and falls Referrals and appointments  In addition, I have reviewed and discussed with patient certain preventive protocols, quality metrics, and best practice recommendations. A written personalized care plan for preventive services as well as general preventive health recommendations were provided to patient.     Kellie Simmering, LPN   [id number]   Nurse Notes: none  Due to this being a virtual visit, the after visit summary with patients personalized plan was offered to patient via mail or my-chart. to pick up at office at next visit

## 2022-10-07 ENCOUNTER — Other Ambulatory Visit: Payer: Self-pay | Admitting: Nurse Practitioner

## 2022-10-07 DIAGNOSIS — G4700 Insomnia, unspecified: Secondary | ICD-10-CM

## 2022-10-08 ENCOUNTER — Ambulatory Visit (INDEPENDENT_AMBULATORY_CARE_PROVIDER_SITE_OTHER): Payer: Medicare HMO | Admitting: Adult Health

## 2022-10-08 ENCOUNTER — Encounter: Payer: Self-pay | Admitting: Adult Health

## 2022-10-08 ENCOUNTER — Ambulatory Visit (INDEPENDENT_AMBULATORY_CARE_PROVIDER_SITE_OTHER)
Admission: RE | Admit: 2022-10-08 | Discharge: 2022-10-08 | Disposition: A | Discharge status: Home / Self Care | Payer: Medicare HMO | Source: Ambulatory Visit | Attending: Adult Health | Admitting: Adult Health

## 2022-10-08 VITALS — BP 122/70 | HR 103 | Temp 98.4°F | Ht 62.0 in | Wt 164.4 lb

## 2022-10-08 DIAGNOSIS — C3492 Malignant neoplasm of unspecified part of left bronchus or lung: Secondary | ICD-10-CM

## 2022-10-08 DIAGNOSIS — R059 Cough, unspecified: Secondary | ICD-10-CM

## 2022-10-08 DIAGNOSIS — R0609 Other forms of dyspnea: Secondary | ICD-10-CM | POA: Diagnosis not present

## 2022-10-08 DIAGNOSIS — R0602 Shortness of breath: Secondary | ICD-10-CM | POA: Diagnosis not present

## 2022-10-08 DIAGNOSIS — J441 Chronic obstructive pulmonary disease with (acute) exacerbation: Secondary | ICD-10-CM

## 2022-10-08 MED ORDER — AZITHROMYCIN 250 MG PO TABS: ORAL_TABLET | ORAL | 0 refills | Status: AC

## 2022-10-08 MED ORDER — PREDNISONE 20 MG PO TABS: 20.0000 mg | ORAL_TABLET | Freq: Every day | ORAL | 0 refills | Status: DC

## 2022-10-08 NOTE — Assessment & Plan Note (Signed)
History of lung cancer status post resection in 2016.  Serial follow-up CTs have been stable.

## 2022-10-08 NOTE — Assessment & Plan Note (Signed)
Acute COPD exacerbation-we will treat with empiric antibiotics and short course of steroids.  Check chest x-ray today.  Check PFTs on return  Plan Patient Instructions  Zpack take as directed  Prednisone 20mg  daily for 5 days  Continue on TRELEGY daily . Rinse after use.  Albuterol inhaler or Neb As needed   Continue on Zyrtec 10mg  At bedtime As needed    Saline nasal rinses As needed   Continue on Flonase daily As needed   Chest xray today  Activity as tolerated.  RSV vaccine and Covid booster vaccine as discussed.  Follow up with Dr. Elsworth Soho  In 3 months with PFT and  and As needed   Please contact office for sooner follow up if symptoms do not improve or worsen or seek emergency care

## 2022-10-08 NOTE — Progress Notes (Signed)
@Patient  ID: [name], female    DOB: 1941/05/03, 82 y.o.   MRN: [id number]  Chief Complaint  Patient presents with   Follow-up    Referring provider: [name], FNP  HPI: 82 year old female former smoker followed for COPD and previous lung cancer Left VATS with left upper lobectomy and mediastinal lymph node dissection in October 2016 with pathology showing invasive adenocarcinoma, adenocarcinoma involving the visceral pleura and the addition to the lymphovascular invasion.  Resection margins as well as dissected lymph nodes were negative for malignancy.  Postop patient had a persistent air leak and required endobronchial valves for resolution  TEST/EVENTS :  2020 CT chest >> stable areas of scarring left upper lobe postsurgical, right lower lobe nodular scarring 12/2017 CT chest >> stable, nodular area of left lung scarring 2016 Pre-op PFT - FEV1 73 %, DLCO 48%   03/2016 FEV1 66%   Spirometry 10/2017 shows an FEV1 at 63% ratio 57 and FVC of 86%.    CT chest September 2020 showed stable nodular areas of architectural distortion in the lungs bilaterally most compatible with areas of chronic postinfectious scarring.  No evidence of recurrent or metastatic disease in the thorax.  Moderate emphysema.  CT chest May 29, 2023 showed no evidence of local tumor recurrence status post left upper lobectomy, no evidence of metastatic disease in the chest.,  Emphysema, nodular focus in the left lower lobe dating back 05/27/2019 without change.  10/08/2022 Follow up ; COPD , Hx of Lung cancer  Patient returns for 4 month follow up for COPD. Feels breathing has not been doing as good.  Gets more short of breath with activities.  Is also has some intermittent cough and congestion and intermittent wheezing over the last month.  She denies any hemoptysis, fever, chest pain, orthopnea, edema.  Walk test in the office shows no significant desaturations on room air with ambulation. Patient is  accompanied by her daughter today.  Patient lives at home.  She does have a nephew that is a flight attendant and lives there but he is in and out.  She still able to do all of her shopping and cooking.  She says she is able to do all of her housework but it takes her a lot longer to get it completed because she gets short of breath.    No Known Allergies  Immunization History  Administered Date(s) Administered   Fluad Quad(high Dose 65+) 07/01/2020, 08/17/2021, 05/20/2022   Influenza, High Dose Seasonal PF 06/06/2016, 05/01/2017, 05/31/2018, 06/28/2019   Influenza, Quadrivalent, Recombinant, Inj, Pf 06/03/2019   Influenza-Unspecified 05/01/2017   Moderna SARS-COV2 Booster Vaccination 09/10/2020   Moderna Sars-Covid-2 Vaccination 10/19/2019, 11/16/2019   Pneumococcal Conjugate-13 07/17/2014, 06/15/2018   Pneumococcal Polysaccharide-23 09/07/2011    Past Medical History:  Diagnosis Date   Arthritis    Asthma    COPD (chronic obstructive pulmonary disease) ([place])    Depression    Diabetes mellitus without complication ([place])    Diverticulitis    GERD (gastroesophageal reflux disease)    Hyperlipidemia    Hypertension 06/15/2016   left lung ca dx'd 2016   Thyroid nodule     Tobacco History: Social History   Tobacco Use  Smoking Status Former   Packs/day: 1.00   Years: 56.00   Total pack years: 56.00   Types: Cigarettes   Quit date: 06/22/2015   Years since quitting: 7.3  Smokeless Tobacco Never   Counseling given: Not Answered   Outpatient Medications Prior to  Visit  Medication Sig Dispense Refill   albuterol (PROVENTIL) (2.5 MG/3ML) 0.083% nebulizer solution Take 3 mLs (2.5 mg total) by nebulization every 6 (six) hours as needed for wheezing or shortness of breath. 75 mL 12   albuterol (VENTOLIN HFA) 108 (90 Base) MCG/ACT inhaler TAKE 2 PUFFS BY MOUTH EVERY 6 HOURS AS NEEDED FOR WHEEZE OR SHORTNESS OF BREATH 18 each 2   aspirin 81 MG tablet Take 81 mg by mouth every  evening.      Blood Glucose Monitoring Suppl (TRUE METRIX AIR GLUCOSE METER) w/Device KIT Use to check blood sugar 4 times a day. Dx code e11.65 1 kit 2   Calcium Carbonate-Vitamin D 600-400 MG-UNIT tablet Take 1 tablet by mouth daily. (Patient taking differently: Take 1 tablet by mouth daily. 600 mg of calcium and 500 units of vitd)     cycloSPORINE (RESTASIS) 0.05 % ophthalmic emulsion 1 drop 2 (two) times daily.     donepezil (ARICEPT) 10 MG tablet TAKE 1 TABLET BY MOUTH EVERY DAY IN THE EVENING 90 tablet 1   ezetimibe (ZETIA) 10 MG tablet TAKE 1 TABLET BY MOUTH EVERYDAY AT BEDTIME 90 tablet 1   flunisolide (NASALIDE) 25 MCG/ACT (0.025%) SOLN PLACE 2 SPRAYS INTO THE NOSE 2 (TWO) TIMES DAILY. 25 mL 1   glucose blood (TRUE METRIX BLOOD GLUCOSE TEST) test strip USE AS INSTRUCTED TO CHECK BLOOD SUGAR TWICE DAILY 200 strip 2   JARDIANCE 10 MG TABS tablet TAKE 1 TABLET BY MOUTH DAILY BEFORE BREAKFAST. 30 tablet 2   KLOR-CON M20 20 MEQ tablet TAKE 1 TABLET BY MOUTH EVERY DAY WITH FOOD 90 tablet 1   magnesium oxide (MAG-OX) 400 MG tablet Take 400 mg by mouth daily.     Melatonin 2.5 MG CAPS Take 2.5 mg by mouth at bedtime as needed (sleep).      mirtazapine (REMERON) 15 MG tablet TAKE 1 TABLET BY MOUTH EVERYDAY AT BEDTIME 90 tablet 1   Multiple Vitamin (MULTIVITAMIN) tablet Take 1 tablet by mouth daily.     niacin (NIASPAN) 500 MG CR tablet TAKE 1 TABLET BY MOUTH EVERY DAY IN THE EVENING 90 tablet 3   OVER THE COUNTER MEDICATION Take 1 tablet by mouth daily.     pantoprazole (PROTONIX) 40 MG tablet TAKE 1 TABLET BY MOUTH TWICE A DAY 180 tablet 1   Probiotic Product (PROBIOTIC PO) Take by mouth.     TRELEGY ELLIPTA 100-62.5-25 MCG/ACT AEPB INHALE 1 PUFF ONE TIME DAILY 180 each 3   TRUEplus Lancets 33G MISC TEST BLOOD SUGAR AS DIRECTED 400 each 3   valACYclovir (VALTREX) 500 MG tablet TAKE 1 TABLET (500 MG TOTAL) BY MOUTH DAILY. 90 tablet 0   White Petrolatum-Mineral Oil (SYSTANE NIGHTTIME OP) Place 1  drop into both eyes at bedtime as needed (for dry eyes).      No facility-administered medications prior to visit.     Review of Systems:   Constitutional:   No  weight loss, night sweats,  Fevers, chills, + fatigue, or  lassitude.  HEENT:   No headaches,  Difficulty swallowing,  Tooth/dental problems, or  Sore throat,                No sneezing, itching, ear ache, nasal congestion, post nasal drip,   CV:  No chest pain,  Orthopnea, PND, swelling in lower extremities, anasarca, dizziness, palpitations, syncope.   GI  No heartburn, indigestion, abdominal pain, nausea, vomiting, diarrhea, change in bowel habits, loss of appetite, bloody stools.  Resp:   No chest wall deformity  Skin: no rash or lesions.  GU: no dysuria, change in color of urine, no urgency or frequency.  No flank pain, no hematuria   MS:  No joint pain or swelling.  No decreased range of motion.  No back pain.    Physical Exam  BP 122/70 (BP Location: Left Arm, Patient Position: Sitting, Cuff Size: Large)   Pulse (!) 103   Temp 98.4 F (36.9 C) (Oral)   Ht 5\' 2"  (1.575 m)   Wt 164 lb 6.4 oz (74.6 kg)   SpO2 96%   BMI 30.07 kg/m   GEN: A/Ox3; pleasant , NAD, well nourished    HEENT:  [place]/AT,   NOSE-clear, THROAT-clear, no lesions, no postnasal drip or exudate noted.   NECK:  Supple w/ fair ROM; no JVD; normal carotid impulses w/o bruits; no thyromegaly or nodules palpated; no lymphadenopathy.    RESP few trace rhonchi with mild expiratory wheezes on forced expiration,  no accessory muscle use, no dullness to percussion  CARD:  RRR, no m/r/g, no peripheral edema, pulses intact, no cyanosis or clubbing.  GI:   Soft & nt; nml bowel sounds; no organomegaly or masses detected.   Musco: Warm bil, no deformities or joint swelling noted.   Neuro: alert, no focal deficits noted.    Skin: Warm, no lesions or rashes    Lab Results:      BNP No results found for: "BNP"  ProBNP No results found  for: "PROBNP"  Imaging: No results found.       Latest Ref Rng & Units 06/09/2015    3:44 PM  PFT Results  FVC-Pre L 2.04   FVC-Predicted Pre % 93   FVC-Post L 2.02   FVC-Predicted Post % 92   Pre FEV1/FVC % % 60   Post FEV1/FCV % % 62   FEV1-Pre L 1.22   FEV1-Predicted Pre % 72   FEV1-Post L 1.24   DLCO uncorrected ml/min/mmHg 11.34   DLCO UNC% % 48   DLVA Predicted % 49   TLC L 4.77   TLC % Predicted % 95   RV % Predicted % 111     No results found for: "NITRICOXIDE"      Assessment & Plan:   COPD (chronic obstructive pulmonary disease) (HCC) Acute COPD exacerbation-we will treat with empiric antibiotics and short course of steroids.  Check chest x-ray today.  Check PFTs on return  Plan Patient Instructions  Zpack take as directed  Prednisone 20mg  daily for 5 days  Continue on TRELEGY daily . Rinse after use.  Albuterol inhaler or Neb As needed   Continue on Zyrtec 10mg  At bedtime As needed    Saline nasal rinses As needed   Continue on Flonase daily As needed   Chest xray today  Activity as tolerated.  RSV vaccine and Covid booster vaccine as discussed.  Follow up with Dr. Elsworth Soho  In 3 months with PFT and  and As needed   Please contact office for sooner follow up if symptoms do not improve or worsen or seek emergency care     Adenocarcinoma of left lung [facility]) History of lung cancer status post resection in 2016.  Serial follow-up CTs have been stable.     Rexene Edison, NP 10/08/2022

## 2022-10-08 NOTE — Patient Instructions (Addendum)
Zpack take as directed  Prednisone 20mg  daily for 5 days  Continue on TRELEGY daily . Rinse after use.  Albuterol inhaler or Neb As needed   Continue on Zyrtec 10mg  At bedtime As needed    Saline nasal rinses As needed   Continue on Flonase daily As needed   Chest xray today  Activity as tolerated.  RSV vaccine and Covid booster vaccine as discussed.  Follow up with Dr. Elsworth Soho  In 3 months with PFT and  and As needed   Please contact office for sooner follow up if symptoms do not improve or worsen or seek emergency care

## 2022-10-12 NOTE — Progress Notes (Signed)
Called and spoke with patient, advised of results/recommendations per Tammy Parrett NP.  She verbalized understanding.  Nothing further needed.

## 2022-10-17 ENCOUNTER — Other Ambulatory Visit: Payer: Self-pay | Admitting: Nurse Practitioner

## 2022-10-17 DIAGNOSIS — E1122 Type 2 diabetes mellitus with diabetic chronic kidney disease: Secondary | ICD-10-CM

## 2022-10-18 ENCOUNTER — Ambulatory Visit (INDEPENDENT_AMBULATORY_CARE_PROVIDER_SITE_OTHER): Payer: Medicare HMO | Admitting: Nurse Practitioner

## 2022-10-18 ENCOUNTER — Encounter: Payer: Self-pay | Admitting: Nurse Practitioner

## 2022-10-18 VITALS — BP 112/78 | HR 68 | Temp 98.3°F | Ht 62.0 in | Wt 164.0 lb

## 2022-10-18 DIAGNOSIS — E2839 Other primary ovarian failure: Secondary | ICD-10-CM | POA: Diagnosis not present

## 2022-10-18 DIAGNOSIS — E1122 Type 2 diabetes mellitus with diabetic chronic kidney disease: Secondary | ICD-10-CM | POA: Diagnosis not present

## 2022-10-18 DIAGNOSIS — E782 Mixed hyperlipidemia: Secondary | ICD-10-CM | POA: Diagnosis not present

## 2022-10-18 DIAGNOSIS — N183 Chronic kidney disease, stage 3 unspecified: Secondary | ICD-10-CM | POA: Diagnosis not present

## 2022-10-18 DIAGNOSIS — C3492 Malignant neoplasm of unspecified part of left bronchus or lung: Secondary | ICD-10-CM

## 2022-10-18 DIAGNOSIS — I700 Atherosclerosis of aorta: Secondary | ICD-10-CM

## 2022-10-18 DIAGNOSIS — J410 Simple chronic bronchitis: Secondary | ICD-10-CM | POA: Diagnosis not present

## 2022-10-18 DIAGNOSIS — R6881 Early satiety: Secondary | ICD-10-CM | POA: Diagnosis not present

## 2022-10-18 MED ORDER — LUBIPROSTONE 8 MCG PO CAPS: 8.0000 ug | ORAL_CAPSULE | Freq: Every day | ORAL | 1 refills | Status: DC

## 2022-10-18 NOTE — Patient Instructions (Signed)
Go to [place] for xray of your abdomen to see if you are constipated. You will start Amitiza daily for the not emptying of bowel.

## 2022-10-18 NOTE — Progress Notes (Signed)
I,Sheena H Holbrook,acting as a Education administrator for [name], FNP.,have documented all relevant documentation on the behalf of [name], FNP,as directed by  [name], FNP while in the presence of [name], [place].    Subjective:     Patient ID: [name] , female    DOB: 1941-02-24 , 82 y.o.   MRN: [id number]   Chief Complaint  Patient presents with   Diabetes   Hypertension    HPI  Patient presents today for DM and htn follow up. Patient reports she just finished atbx and oral prednisone taper for URI. Patient reports she has improved.  She does not recall having a tdap done recently at the pharmacy  Wt Readings from Last 3 Encounters: 10/18/22 : 164 lb (74.4 kg) 10/08/22 : 164 lb 6.4 oz (74.6 kg) 09/30/22 : 161 lb 12.8 oz (73.4 kg)  She feels full all the time. She does not get hungry. She does not feel like she is having a complete emptying of her bowels.    Diabetes She presents for her follow-up diabetic visit. She has type 2 diabetes mellitus. Her disease course has been stable. Pertinent negatives for hypoglycemia include no dizziness or headaches. Pertinent negatives for diabetes include no fatigue, no polydipsia, no polyphagia and no polyuria. There are no hypoglycemic complications. There are no diabetic complications. Risk factors for coronary artery disease include sedentary lifestyle. Current diabetic treatment includes diet. She is following a generally healthy diet. When asked about meal planning, she reported none. She has not had a previous visit with a dietitian. She rarely participates in exercise. (It is difficult to get blood from her fingers. She is checking once a day. Sometimes her hands are blue, she gets up about 10a or 11a. She will check before taking her trilogy. ) She does not see a podiatrist.Eye exam current: scheduled to see eye doctor in June.  Hypertension Associated symptoms include shortness of breath. Pertinent negatives include no  headaches.  Hyperlipidemia This is a chronic problem. The current episode started more than 1 year ago. The problem is controlled. Recent lipid tests were reviewed and are variable. There are no known factors aggravating her hyperlipidemia. Associated symptoms include shortness of breath. Current antihyperlipidemic treatment includes ezetimibe. There are no compliance problems.  Risk factors for coronary artery disease include diabetes mellitus, dyslipidemia and a sedentary lifestyle.     Past Medical History:  Diagnosis Date   Arthritis    Asthma    COPD (chronic obstructive pulmonary disease) ([place])    Depression    Diabetes mellitus without complication (HCC)    Diverticulitis    GERD (gastroesophageal reflux disease)    Hyperlipidemia    Hypertension 06/15/2016   left lung ca dx'd 2016   Thyroid nodule      Family History  Problem Relation Age of Onset   Renal cancer Brother    Diabetes Mother    Hypertension Mother    Renal Disease Mother    Healthy Father      Current Outpatient Medications:    albuterol (PROVENTIL) (2.5 MG/3ML) 0.083% nebulizer solution, Take 3 mLs (2.5 mg total) by nebulization every 6 (six) hours as needed for wheezing or shortness of breath., Disp: 75 mL, Rfl: 12   aspirin 81 MG tablet, Take 81 mg by mouth every evening. , Disp: , Rfl:    Blood Glucose Monitoring Suppl (TRUE METRIX AIR GLUCOSE METER) w/Device KIT, Use to check blood sugar 4 times a day. Dx code e11.65,  Disp: 1 kit, Rfl: 2   Calcium Carbonate-Vitamin D 600-400 MG-UNIT tablet, Take 1 tablet by mouth daily. (Patient taking differently: Take 1 tablet by mouth daily. 600 mg of calcium and 500 units of vitd), Disp:  , Rfl:    cycloSPORINE (RESTASIS) 0.05 % ophthalmic emulsion, 1 drop 2 (two) times daily., Disp: , Rfl:    ezetimibe (ZETIA) 10 MG tablet, TAKE 1 TABLET BY MOUTH EVERYDAY AT BEDTIME, Disp: 90 tablet, Rfl: 1   flunisolide (NASALIDE) 25 MCG/ACT (0.025%) SOLN, PLACE 2 SPRAYS INTO THE  NOSE 2 (TWO) TIMES DAILY., Disp: 25 mL, Rfl: 1   glucose blood (TRUE METRIX BLOOD GLUCOSE TEST) test strip, USE AS INSTRUCTED TO CHECK BLOOD SUGAR TWICE DAILY, Disp: 200 strip, Rfl: 2   JARDIANCE 10 MG TABS tablet, TAKE 1 TABLET BY MOUTH EVERY DAY BEFORE BREAKFAST, Disp: 30 tablet, Rfl: 2   KLOR-CON M20 20 MEQ tablet, TAKE 1 TABLET BY MOUTH EVERY DAY WITH FOOD, Disp: 90 tablet, Rfl: 1   lubiprostone (AMITIZA) 8 MCG capsule, Take 1 capsule (8 mcg total) by mouth daily with breakfast., Disp: 90 capsule, Rfl: 1   magnesium oxide (MAG-OX) 400 MG tablet, Take 400 mg by mouth daily., Disp: , Rfl:    Melatonin 2.5 MG CAPS, Take 2.5 mg by mouth at bedtime as needed (sleep). , Disp: , Rfl:    mirtazapine (REMERON) 15 MG tablet, TAKE 1 TABLET BY MOUTH EVERYDAY AT BEDTIME, Disp: 90 tablet, Rfl: 1   Multiple Vitamin (MULTIVITAMIN) tablet, Take 1 tablet by mouth daily., Disp: , Rfl:    niacin (NIASPAN) 500 MG CR tablet, TAKE 1 TABLET BY MOUTH EVERY DAY IN THE EVENING, Disp: 90 tablet, Rfl: 3   OVER THE COUNTER MEDICATION, Take 1 tablet by mouth daily., Disp: , Rfl:    pantoprazole (PROTONIX) 40 MG tablet, TAKE 1 TABLET BY MOUTH TWICE A DAY, Disp: 180 tablet, Rfl: 1   Probiotic Product (PROBIOTIC PO), Take by mouth., Disp: , Rfl:    rosuvastatin (CRESTOR) 10 MG tablet, Take 1 tablet (10 mg total) by mouth daily., Disp: 90 tablet, Rfl: 1   TRELEGY ELLIPTA 100-62.5-25 MCG/ACT AEPB, INHALE 1 PUFF ONE TIME DAILY, Disp: 180 each, Rfl: 3   TRUEplus Lancets 33G MISC, TEST BLOOD SUGAR AS DIRECTED, Disp: 400 each, Rfl: 3   valACYclovir (VALTREX) 500 MG tablet, TAKE 1 TABLET (500 MG TOTAL) BY MOUTH DAILY., Disp: 90 tablet, Rfl: 0   White Petrolatum-Mineral Oil (SYSTANE NIGHTTIME OP), Place 1 drop into both eyes at bedtime as needed (for dry eyes). , Disp: , Rfl:    albuterol (VENTOLIN HFA) 108 (90 Base) MCG/ACT inhaler, INHALE 2 PUFFS BY MOUTH EVERY 6 HOURS AS NEEDED FOR WHEEZE OR SHORTNESS OF BREATH, Disp: 18 each, Rfl:  2   donepezil (ARICEPT) 10 MG tablet, TAKE 1 TABLET BY MOUTH EVERY DAY IN THE EVENING, Disp: 90 tablet, Rfl: 1   No Known Allergies   Review of Systems  Constitutional:  Negative for fatigue.  Respiratory:  Positive for shortness of breath.   Endocrine: Negative for polydipsia, polyphagia and polyuria.  Neurological:  Negative for dizziness and headaches.  All other systems reviewed and are negative.    Today's Vitals   10/18/22 1433  BP: 112/78  Pulse: 68  Temp: 98.3 F (36.8 C)  TempSrc: Oral  SpO2: 97%  Weight: 164 lb (74.4 kg)  Height: '5\' 2"'$  (1.575 m)   Body mass index is 30 kg/m.   Objective:  Physical Exam Vitals  reviewed.  Constitutional:      General: She is not in acute distress.    Appearance: Normal appearance.  Cardiovascular:     Pulses: Normal pulses.     Heart sounds: Normal heart sounds. No murmur heard. Pulmonary:     Effort: Pulmonary effort is normal. No respiratory distress.     Breath sounds: No wheezing.  Skin:    General: Skin is warm and dry.  Neurological:     General: No focal deficit present.     Mental Status: She is alert and oriented to person, place, and time.     Cranial Nerves: No cranial nerve deficit.     Motor: No weakness.  Psychiatric:        Mood and Affect: Mood normal.        Behavior: Behavior normal.        Thought Content: Thought content normal.        Judgment: Judgment normal.         Assessment And Plan:     1. Diabetes mellitus with stage 3 chronic kidney disease (HCC) Chronic, slightly elevated Continue with current medications Encouraged to increase physical activity to 150 minutes per week as tolerated Suggestion is made for her to avoid simple carbohydrates from her diet including Cakes, Sweet Desserts, Ice Cream, Soda (diet and regular), Sweet Tea, Candies, Chips, Cookies, Store Bought Juices, Alcohol in Excess of 1-2 drinks a day, Artificial Sweeteners, Coffee Creamer, and "Sugar-free" Products. This  will help patient to have more stable blood glucose  - Hemoglobin A1c - BMP8+eGFR  2. Atherosclerosis of aorta (HCC) Comments: Continue statin, tolerating well.  3. Mixed hyperlipidemia Chronic, controlled Continue with current medications, tolerating statin well - BMP8+eGFR - Lipid panel  4. Early satiety Comments: Will check KUB this may be related to constipation. - DG Abd 1 View; Future - lubiprostone (AMITIZA) 8 MCG capsule; Take 1 capsule (8 mcg total) by mouth daily with breakfast.  Dispense: 90 capsule; Refill: 1  5. Adenocarcinoma of left lung ([place]) Comments: Continue follow up with Oncology  6. Simple chronic bronchitis (HCC) - CBC  7. Decreased estrogen level Will order bone density - DG Bone Density; Future   Patient was given opportunity to ask questions. Patient verbalized understanding of the plan and was able to repeat key elements of the plan. All questions were answered to their satisfaction.  [name], FNP   I, [name], FNP, have reviewed all documentation for this visit. The documentation on 10/18/22 for the exam, diagnosis, procedures, and orders are all accurate and complete.   IF YOU HAVE BEEN REFERRED TO A SPECIALIST, IT MAY TAKE 1-2 WEEKS TO SCHEDULE/PROCESS THE REFERRAL. IF YOU HAVE NOT HEARD FROM US/SPECIALIST IN TWO WEEKS, PLEASE GIVE [name] A CALL AT [phone].   THE PATIENT IS ENCOURAGED TO PRACTICE SOCIAL DISTANCING DUE TO THE COVID-19 PANDEMIC.

## 2022-10-19 ENCOUNTER — Other Ambulatory Visit: Payer: Self-pay | Admitting: Nurse Practitioner

## 2022-10-19 LAB — CBC
Hematocrit: 48.5 % — ABNORMAL HIGH (ref 34.0–46.6)
Hemoglobin: 16.2 g/dL — ABNORMAL HIGH (ref 11.1–15.9)
MCH: 29.9 pg (ref 26.6–33.0)
MCHC: 33.4 g/dL (ref 31.5–35.7)
MCV: 90 fL (ref 79–97)
Platelets: 257 10*3/uL (ref 150–450)
RBC: 5.41 x10E6/uL — ABNORMAL HIGH (ref 3.77–5.28)
RDW: 13.8 % (ref 11.7–15.4)
WBC: 9.5 10*3/uL (ref 3.4–10.8)

## 2022-10-19 LAB — BMP8+EGFR
BUN/Creatinine Ratio: 17 (ref 12–28)
BUN: 19 mg/dL (ref 8–27)
CO2: 23 mmol/L (ref 20–29)
Calcium: 10.3 mg/dL (ref 8.7–10.3)
Chloride: 100 mmol/L (ref 96–106)
Creatinine, Ser: 1.1 mg/dL — ABNORMAL HIGH (ref 0.57–1.00)
Glucose: 157 mg/dL — ABNORMAL HIGH (ref 70–99)
Potassium: 4.1 mmol/L (ref 3.5–5.2)
Sodium: 140 mmol/L (ref 134–144)
eGFR: 50 mL/min/{1.73_m2} — ABNORMAL LOW (ref >59)

## 2022-10-19 LAB — HEMOGLOBIN A1C
Est. average glucose Bld gHb Est-mCnc: 151 mg/dL
Hgb A1c MFr Bld: 6.9 % — ABNORMAL HIGH (ref 4.8–5.6)

## 2022-10-19 LAB — LIPID PANEL
Chol/HDL Ratio: 4.6 ratio — ABNORMAL HIGH (ref 0.0–4.4)
Cholesterol, Total: 221 mg/dL — ABNORMAL HIGH (ref 100–199)
HDL: 48 mg/dL (ref >39)
LDL Chol Calc (NIH): 106 mg/dL — ABNORMAL HIGH (ref 0–99)
Triglycerides: 396 mg/dL — ABNORMAL HIGH (ref 0–149)
VLDL Cholesterol Cal: 67 mg/dL — ABNORMAL HIGH (ref 5–40)

## 2022-10-20 ENCOUNTER — Ambulatory Visit: Payer: Self-pay

## 2022-10-20 NOTE — Patient Outreach (Signed)
  Care Coordination   Follow Up Visit Note   10/20/2022 Name: [name] MRN: [id number] DOB: 10-06-1940  [name] is a 82 y.o. year old female who sees [name], [place] for primary care. I spoke with  [name] by phone today.  What matters to the patients health and wellness today?  Patient would like to work on lowering her A1c and Cholesterol levels. Patient would like to participate in PREP.     Goals Addressed               This Visit's Progress     Patient Stated     I would like to work on lower my A1c (pt-stated)        Care Coordination Interventions: Evaluation of current treatment plan related to diabetes mellitus and patient's adherence to plan as established by provider Counseled on Diabetic diet, my plate method, 992 minutes of moderate intensity exercise/week Educated patient regarding the PREP program, patient would like to move forward and participate in this program Sent in basket message to PCP [name] FNP requesting PREP referral  Lab Results  Component Value Date   HGBA1C 6.9 (H) 10/18/2022              Other     To lower Cholesterol and Triglycerides        Care Coordination Interventions: Provider established cholesterol goals reviewed Counseled on importance of regular laboratory monitoring as prescribed Reviewed importance of limiting foods high in cholesterol Reviewed exercise goals and target of 150 minutes per week Reviewed medications with patient and discussed importance of medication adherence Mailed printed educational materials related to Cholesterol management; Chair Exercises           SDOH assessments and interventions completed:  No     Care Coordination Interventions:  Yes, provided   Follow up plan: Follow up call scheduled for 11/03/22 @1 :30 PM     Encounter Outcome:  Pt. Visit Completed

## 2022-10-20 NOTE — Patient Instructions (Signed)
Visit Information  Thank you for taking time to visit with me today. Please don't hesitate to contact me if I can be of assistance to you.   Following are the goals we discussed today:   Goals Addressed               This Visit's Progress     Patient Stated     I would like to work on lower my A1c (pt-stated)        Care Coordination Interventions: Evaluation of current treatment plan related to diabetes mellitus and patient's adherence to plan as established by provider Counseled on Diabetic diet, my plate method, 440 minutes of moderate intensity exercise/week Educated patient regarding the PREP program, patient would like to move forward and participate in this program Sent in basket message to PCP [name] FNP requesting PREP referral  Lab Results  Component Value Date   HGBA1C 6.9 (H) 10/18/2022              Other     To lower Cholesterol and Triglycerides        Care Coordination Interventions: Provider established cholesterol goals reviewed Counseled on importance of regular laboratory monitoring as prescribed Reviewed importance of limiting foods high in cholesterol Reviewed exercise goals and target of 150 minutes per week Reviewed medications with patient and discussed importance of medication adherence Mailed printed educational materials related to Cholesterol management; Chair Exercises           Our next appointment is by telephone on 11/03/22 at 1:30 PM   Please call the care guide team at [phone] if you need to cancel or reschedule your appointment.   If you are experiencing a Mental Health or [place] or need someone to talk to, please call 1-800-273-TALK (toll free, 24 hour hotline) go to [facility] [place] [phone])  The patient verbalized understanding of instructions, educational materials, and care plan provided today and DECLINED offer to receive copy of  patient instructions, educational materials, and care plan.   Barb Merino, RN, BSN, CCM Care Management Coordinator [facility]  Direct Phone: [phone]

## 2022-10-21 ENCOUNTER — Ambulatory Visit
Admission: RE | Admit: 2022-10-21 | Discharge: 2022-10-21 | Disposition: A | Discharge status: Home / Self Care | Payer: Medicare HMO | Source: Ambulatory Visit | Attending: Nurse Practitioner | Admitting: Nurse Practitioner

## 2022-10-21 ENCOUNTER — Other Ambulatory Visit: Payer: Self-pay | Admitting: Nurse Practitioner

## 2022-10-21 DIAGNOSIS — C3492 Malignant neoplasm of unspecified part of left bronchus or lung: Secondary | ICD-10-CM

## 2022-10-21 DIAGNOSIS — R6881 Early satiety: Secondary | ICD-10-CM | POA: Diagnosis not present

## 2022-10-21 DIAGNOSIS — R140 Abdominal distension (gaseous): Secondary | ICD-10-CM | POA: Diagnosis not present

## 2022-10-21 DIAGNOSIS — M419 Scoliosis, unspecified: Secondary | ICD-10-CM | POA: Diagnosis not present

## 2022-10-21 DIAGNOSIS — N183 Type 2 diabetes mellitus with diabetic chronic kidney disease: Secondary | ICD-10-CM

## 2022-10-21 DIAGNOSIS — E782 Mixed hyperlipidemia: Secondary | ICD-10-CM

## 2022-10-21 DIAGNOSIS — I878 Other specified disorders of veins: Secondary | ICD-10-CM | POA: Diagnosis not present

## 2022-10-22 ENCOUNTER — Other Ambulatory Visit: Payer: Self-pay | Admitting: Pulmonary Disease

## 2022-10-29 ENCOUNTER — Telehealth: Payer: Self-pay

## 2022-10-29 ENCOUNTER — Other Ambulatory Visit: Payer: Self-pay | Admitting: Nurse Practitioner

## 2022-10-29 MED ORDER — ROSUVASTATIN CALCIUM 10 MG PO TABS: 10.0000 mg | ORAL_TABLET | Freq: Every day | ORAL | 1 refills | Status: DC

## 2022-10-29 NOTE — Telephone Encounter (Signed)
Call to pt reference PREP referral.  Would prefer to use [facility] as a location.  Explained do not have that available currently but could call her when we do. Patient agreeable to that plan.  Confirmed she has my number for call back.

## 2022-10-29 NOTE — Progress Notes (Signed)
I have added Rosuvastatin 10 mg and sent to the local pharmacy, she will need a f/u in 8 weeks

## 2022-11-03 ENCOUNTER — Ambulatory Visit: Payer: Self-pay

## 2022-11-03 NOTE — Patient Instructions (Signed)
Visit Information  Thank you for taking time to visit with me today. Please don't hesitate to contact me if I can be of assistance to you.   Following are the goals we discussed today:   Goals Addressed             This Visit's Progress    To lower Cholesterol and Triglycerides       Care Coordination Interventions: Provider established cholesterol goals reviewed Answered patient questions regarding how to take newly prescribed Rosuvastatin; educated patient on indication, dosage and frequency  Confirmed patient received the printed educational materials related to Cholesterol management; Chair Exercises Determined patient plans to participate in the PREP program once a spot is available at the [facility]     Component Value Date/Time   CHOL 221 (H) 10/18/2022 1527   TRIG 396 (H) 10/18/2022 1527   HDL 48 10/18/2022 1527   CHOLHDL 4.6 (H) 10/18/2022 1527   LDLCALC 106 (H) 10/18/2022 1527   LABVLDL 67 (H) 10/18/2022 1527               Our next appointment is by telephone on 01/06/23 at 1:00 PM  Please call the care guide team at [phone] if you need to cancel or reschedule your appointment.   If you are experiencing a Mental Health or [place] or need someone to talk to, please call 1-800-273-TALK (toll free, 24 hour hotline) go to [facility] [phone])  The patient verbalized understanding of instructions, educational materials, and care plan provided today and agreed to receive a mailed copy of patient instructions, educational materials, and care plan.   Barb Merino, RN, BSN, CCM Care Management Coordinator [facility] Care Management Direct Phone: [phone]

## 2022-11-03 NOTE — Patient Outreach (Signed)
  Care Coordination   Follow Up Visit Note   11/03/2022 Name: [name] MRN: [id number] DOB: 02-11-1941  [name] is a 82 y.o. year old female who sees [name], [place] for primary care. I spoke with  [name] by phone today.  What matters to the patients health and wellness today?  Patient will start Rosuvastatin and take it as directed to help lower her Cholesterol levels.     Goals Addressed             This Visit's Progress    To lower Cholesterol and Triglycerides       Care Coordination Interventions: Provider established cholesterol goals reviewed Answered patient questions regarding how to take newly prescribed Rosuvastatin; educated patient on indication, dosage and frequency  Confirmed patient received the printed educational materials related to Cholesterol management; Chair Exercises Determined patient plans to participate in the PREP program once a spot is available at the [facility]     Component Value Date/Time   CHOL 221 (H) 10/18/2022 1527   TRIG 396 (H) 10/18/2022 1527   HDL 48 10/18/2022 1527   CHOLHDL 4.6 (H) 10/18/2022 1527   LDLCALC 106 (H) 10/18/2022 1527   LABVLDL 67 (H) 10/18/2022 1527           Interventions Today    Flowsheet Row Most Recent Value  Chronic Disease   Chronic disease during today's visit Other  [Hyperlipidemia]  General Interventions   General Interventions Discussed/Reviewed General Interventions Reviewed, General Interventions Discussed  Exercise Interventions   Exercise Discussed/Reviewed Physical Activity  Physical Activity Discussed/Reviewed PREP  Nutrition Interventions   Nutrition Discussed/Reviewed Decreasing fats  Pharmacy Interventions   Pharmacy Dicussed/Reviewed Medications and their functions          SDOH assessments and interventions completed:  No     Care Coordination Interventions:  Yes, provided   Follow up plan: Follow up call scheduled for 01/06/23 @1 :00  PM    Encounter Outcome:  Pt. Visit Completed

## 2022-11-11 DIAGNOSIS — Z13820 Encounter for screening for osteoporosis: Secondary | ICD-10-CM | POA: Diagnosis not present

## 2022-11-12 ENCOUNTER — Other Ambulatory Visit: Payer: Self-pay

## 2022-11-15 ENCOUNTER — Other Ambulatory Visit: Payer: Self-pay | Admitting: Nurse Practitioner

## 2022-12-06 ENCOUNTER — Telehealth: Payer: Self-pay | Admitting: *Deleted

## 2022-12-06 NOTE — Progress Notes (Unsigned)
  Care Coordination Note  12/06/2022 Name: [name] MRN: [id number] DOB: October 23, 1940  [name] is a 82 y.o. year old female who is a primary care patient of [name], [place] and is actively engaged with the care management team. I reached out to [name] by phone today to assist with re-scheduling a follow up visit with the RN Case Manager  Follow up plan: Unsuccessful telephone outreach attempt made. A HIPAA compliant phone message was left for the patient providing contact information and requesting a return call.   [place]  Direct Dial: [phone]

## 2022-12-07 NOTE — Progress Notes (Signed)
  Care Coordination Note  12/07/2022 Name: [name] MRN: [id number] DOB: [birth year]  [name] is a 82 y.o. year old female who is a primary care patient of [name], [place] and is actively engaged with the care management team. I reached out to [name] by phone today to assist with re-scheduling a follow up visit with the RN Case Manager  Follow up plan: Telephone appointment with care management team member scheduled for:01/17/23 [place]: [phone]

## 2022-12-13 ENCOUNTER — Other Ambulatory Visit: Payer: Self-pay | Admitting: Nurse Practitioner

## 2023-01-04 ENCOUNTER — Ambulatory Visit (INDEPENDENT_AMBULATORY_CARE_PROVIDER_SITE_OTHER): Payer: Medicare HMO | Admitting: Nurse Practitioner

## 2023-01-04 ENCOUNTER — Encounter: Payer: Self-pay | Admitting: Nurse Practitioner

## 2023-01-04 VITALS — BP 118/64 | HR 89 | Temp 98.3°F | Ht 62.2 in | Wt 160.8 lb

## 2023-01-04 DIAGNOSIS — Z6829 Body mass index (BMI) 29.0-29.9, adult: Secondary | ICD-10-CM | POA: Diagnosis not present

## 2023-01-04 DIAGNOSIS — I700 Atherosclerosis of aorta: Secondary | ICD-10-CM

## 2023-01-04 DIAGNOSIS — I129 Hypertensive chronic kidney disease with stage 1 through stage 4 chronic kidney disease, or unspecified chronic kidney disease: Secondary | ICD-10-CM | POA: Diagnosis not present

## 2023-01-04 DIAGNOSIS — Z0001 Encounter for general adult medical examination with abnormal findings: Secondary | ICD-10-CM | POA: Diagnosis not present

## 2023-01-04 DIAGNOSIS — J439 Emphysema, unspecified: Secondary | ICD-10-CM | POA: Diagnosis not present

## 2023-01-04 DIAGNOSIS — N183 Chronic kidney disease, stage 3 unspecified: Secondary | ICD-10-CM

## 2023-01-04 DIAGNOSIS — Z0000 Encounter for general adult medical examination without abnormal findings: Secondary | ICD-10-CM

## 2023-01-04 DIAGNOSIS — E1122 Type 2 diabetes mellitus with diabetic chronic kidney disease: Secondary | ICD-10-CM

## 2023-01-04 DIAGNOSIS — Z23 Encounter for immunization: Secondary | ICD-10-CM

## 2023-01-04 DIAGNOSIS — E782 Mixed hyperlipidemia: Secondary | ICD-10-CM

## 2023-01-04 LAB — POCT URINALYSIS DIPSTICK
Bilirubin, UA: NEGATIVE
Glucose, UA: POSITIVE — AB
Ketones, UA: NEGATIVE
Leukocytes, UA: NEGATIVE
Nitrite, UA: NEGATIVE
Protein, UA: NEGATIVE
Spec Grav, UA: <=1.005 — AB (ref 1.010–1.025)
Urobilinogen, UA: 0.2 E.U./dL
pH, UA: 6 (ref 5.0–8.0)

## 2023-01-04 MED ORDER — RYBELSUS 7 MG PO TABS: 1.0000 | ORAL_TABLET | Freq: Every day | ORAL | 1 refills | Status: DC

## 2023-01-04 NOTE — Progress Notes (Signed)
[name],acting as a Neurosurgeon for Shannon Felts, FNP.,have documented all relevant documentation on the behalf of Shannon Felts, FNP,as directed by  Shannon Felts, FNP while in the presence of Shannon Felts, FNP.   Subjective:     Patient ID: [name] , female    DOB: Sep 24, 1940 , 82 y.o.   MRN: [id number]   Chief Complaint  Patient presents with   Annual Exam    HPI  Patient is here for physical exam. Patient is no longer followed by her GYN.   She is to f/u with pulmonology next week and she thinks she may get on oxygen because she is getting exhausted walking short distances. Unable to do full exercise program.   Continues to have low back pain. She is using diclofenac with relief. Occasionally will take 2 aleve.   Wt Readings from Last 3 Encounters: 01/04/23 : 160 lb 12.8 oz (72.9 kg) 10/18/22 : 164 lb (74.4 kg) 10/08/22 : 164 lb 6.4 oz (74.6 kg)    Diabetes She presents for her follow-up diabetic visit. She has type 2 diabetes mellitus. Her disease course has been stable. There are no hypoglycemic complications. Risk factors for coronary artery disease include sedentary lifestyle. [name] diabetic treatment includes diet. She is following a generally healthy diet. When asked about meal planning, she reported none. She has not had a previous visit with a dietitian. She rarely participates in exercise. (She is not checking her blood sugars) She does not see a podiatrist.Eye exam [name]: scheduled to see eye doctor in June.  Hyperlipidemia This is a chronic problem. The [name] episode started more than 1 year ago. The problem is controlled. Recent lipid tests were reviewed and are variable. There are no known factors aggravating her hyperlipidemia. [name] antihyperlipidemic treatment includes ezetimibe. There are no compliance problems.  Risk factors for coronary artery disease include diabetes mellitus, dyslipidemia and a sedentary lifestyle.     Past Medical  History:  Diagnosis Date   Arthritis    Asthma    COPD (chronic obstructive pulmonary disease) (HCC)    Depression    Diabetes mellitus without complication (HCC)    Diverticulitis    GERD (gastroesophageal reflux disease)    Hyperlipidemia    Hypertension 06/15/2016   left lung ca dx'd 2016   Thyroid nodule      Family History  Problem Relation Age of Onset   Renal cancer Brother    Diabetes Mother    Hypertension Mother    Renal Disease Mother    Healthy Father      [name] Outpatient Medications:    albuterol (PROVENTIL) (2.5 MG/3ML) 0.083% nebulizer solution, Take 3 mLs (2.5 mg total) by nebulization every 6 (six) hours as needed for wheezing or shortness of breath., Disp: 75 mL, Rfl: 12   aspirin 81 MG tablet, Take 81 mg by mouth every evening. , Disp: , Rfl:    BIOTIN 5000 PO, Take 1 capsule by mouth daily at 2 am., Disp: , Rfl:    Blood Glucose Monitoring Suppl (TRUE METRIX AIR GLUCOSE METER) w/Device KIT, Use to check blood sugar 4 times a day. Dx code e11.65, Disp: 1 kit, Rfl: 2   Calcium Carbonate-Vitamin D 600-400 MG-UNIT tablet, Take 1 tablet by mouth daily. (Patient taking differently: Take 1 tablet by mouth daily. 600 mg of calcium and 500 units of vitd), Disp:  , Rfl:    cycloSPORINE (RESTASIS) 0.05 % ophthalmic emulsion, 1 drop 2 (two) times daily., Disp: , Rfl:  donepezil (ARICEPT) 10 MG tablet, TAKE 1 TABLET BY MOUTH EVERY DAY IN THE EVENING, Disp: 90 tablet, Rfl: 1   ezetimibe (ZETIA) 10 MG tablet, TAKE 1 TABLET BY MOUTH EVERYDAY AT BEDTIME, Disp: 90 tablet, Rfl: 1   flunisolide (NASALIDE) 25 MCG/ACT (0.025%) SOLN, PLACE 2 SPRAYS INTO THE NOSE 2 (TWO) TIMES DAILY., Disp: 25 mL, Rfl: 1   glucose blood (TRUE METRIX BLOOD GLUCOSE TEST) test strip, USE AS INSTRUCTED TO CHECK BLOOD SUGAR TWICE DAILY, Disp: 200 strip, Rfl: 2   KLOR-CON M20 20 MEQ tablet, TAKE 1 TABLET BY MOUTH EVERY DAY WITH FOOD, Disp: 90 tablet, Rfl: 1   lubiprostone (AMITIZA) 8 MCG capsule, Take  1 capsule (8 mcg total) by mouth daily with breakfast., Disp: 90 capsule, Rfl: 1   magnesium oxide (MAG-OX) 400 MG tablet, Take 400 mg by mouth daily., Disp: , Rfl:    Melatonin 2.5 MG CAPS, Take 2.5 mg by mouth at bedtime as needed (sleep). , Disp: , Rfl:    mirtazapine (REMERON) 15 MG tablet, TAKE 1 TABLET BY MOUTH EVERYDAY AT BEDTIME, Disp: 90 tablet, Rfl: 1   Multiple Vitamin (MULTIVITAMIN) tablet, Take 1 tablet by mouth daily., Disp: , Rfl:    niacin (NIASPAN) 500 MG CR tablet, TAKE 1 TABLET BY MOUTH EVERY DAY IN THE EVENING, Disp: 90 tablet, Rfl: 3   OVER THE COUNTER MEDICATION, Take 1 tablet by mouth daily., Disp: , Rfl:    pantoprazole (PROTONIX) 40 MG tablet, TAKE 1 TABLET BY MOUTH TWICE A DAY, Disp: 180 tablet, Rfl: 1   Probiotic Product (PROBIOTIC PO), Take by mouth., Disp: , Rfl:    rosuvastatin (CRESTOR) 10 MG tablet, Take 1 tablet (10 mg total) by mouth daily., Disp: 90 tablet, Rfl: 1   Semaglutide (RYBELSUS) 7 MG TABS, Take 1 tablet (7 mg total) by mouth daily., Disp: 90 tablet, Rfl: 1   TRELEGY ELLIPTA 100-62.5-25 MCG/ACT AEPB, INHALE 1 PUFF ONE TIME DAILY, Disp: 180 each, Rfl: 3   TRUEplus Lancets 33G MISC, TEST BLOOD SUGAR AS DIRECTED, Disp: 400 each, Rfl: 3   valACYclovir (VALTREX) 500 MG tablet, TAKE 1 TABLET (500 MG TOTAL) BY MOUTH DAILY., Disp: 90 tablet, Rfl: 0   White Petrolatum-Mineral Oil (SYSTANE NIGHTTIME OP), Place 1 drop into both eyes at bedtime as needed (for dry eyes). , Disp: , Rfl:    albuterol (VENTOLIN HFA) 108 (90 Base) MCG/ACT inhaler, INHALE 2 PUFFS BY MOUTH EVERY 6 HOURS AS NEEDED FOR WHEEZE OR SHORTNESS OF BREATH, Disp: 18 each, Rfl: 2   JARDIANCE 10 MG TABS tablet, TAKE 1 TABLET BY MOUTH EVERY DAY BEFORE BREAKFAST, Disp: 30 tablet, Rfl: 2   No Known Allergies    The patient states she uses post menopausal status.  No LMP recorded. Patient is postmenopausal.. Negative for Dysmenorrhea and Negative for Menorrhagia. Negative for: breast discharge, breast  lump(s), breast pain and breast self exam. Associated symptoms include abnormal vaginal bleeding. Pertinent negatives include abnormal bleeding (hematology), anxiety, decreased libido, depression, difficulty falling sleep, dyspareunia, history of infertility, nocturia, sexual dysfunction, sleep disturbances, urinary incontinence, urinary urgency, vaginal discharge and vaginal itching. Diet regular.  The patient states her exercise level is   . The patient's tobacco use is:  Social History   Tobacco Use  Smoking Status Former   Packs/day: 1.00   Years: 56.00   Additional pack years: 0.00   Total pack years: 56.00   Types: Cigarettes   Quit date: 06/22/2015   Years since quitting: 7.5  Smokeless Tobacco Never  . She has been exposed to passive smoke. The patient's alcohol use is:  Social History   Substance and Sexual Activity  Alcohol Use No   Alcohol/week: 0.0 standard drinks of alcohol    Review of Systems  Constitutional: Negative.   HENT: Negative.    Eyes: Negative.   Respiratory: Negative.    Cardiovascular: Negative.   Gastrointestinal: Negative.   Endocrine: Negative.   Genitourinary: Negative.   Musculoskeletal:  Positive for back pain (low back pain).  Skin: Negative.   Allergic/Immunologic: Negative.   Neurological: Negative.   Hematological: Negative.   Psychiatric/Behavioral: Negative.       Today's Vitals   01/04/23 1106  BP: 118/64  Pulse: 89  Temp: 98.3 F (36.8 C)  Weight: 160 lb 12.8 oz (72.9 kg)  Height: 5' 2.2" (1.58 m)  PainSc: 0-No pain   Body mass index is 29.22 kg/m.   Objective:  Physical Exam Vitals reviewed.  Constitutional:      General: She is not in acute distress.    Appearance: Normal appearance. She is well-developed.  HENT:     Head: Normocephalic and atraumatic.     Right Ear: Hearing, tympanic membrane, ear canal and external ear normal. There is no impacted cerumen.     Left Ear: Hearing, tympanic membrane, ear canal and  external ear normal. There is no impacted cerumen.     Nose:     Comments: Deferred - masked    Mouth/Throat:     Comments: Deferred - masked Eyes:     General: Lids are normal.     Extraocular Movements: Extraocular movements intact.     Conjunctiva/sclera: Conjunctivae normal.     Pupils: Pupils are equal, round, and reactive to light.     Funduscopic exam:    Right eye: No papilledema.        Left eye: No papilledema.  Neck:     Thyroid: No thyroid mass.     Vascular: No carotid bruit.  Cardiovascular:     Rate and Rhythm: Normal rate and regular rhythm.     Pulses: Normal pulses.     Heart sounds: Normal heart sounds. No murmur heard. Pulmonary:     Effort: Respiratory distress (with exertion, recovers well) present.     Breath sounds: Normal breath sounds. No wheezing or rhonchi.  Chest:     Chest wall: No mass.  Breasts:    Tanner Score is 5.     Right: Normal. No mass or tenderness.     Left: Normal. No mass or tenderness.  Abdominal:     General: Abdomen is flat. Bowel sounds are normal. There is no distension.     Palpations: Abdomen is soft.     Tenderness: There is no abdominal tenderness.  Musculoskeletal:        General: No swelling or tenderness. Normal range of motion.     Cervical back: Full passive range of motion without pain, normal range of motion and neck supple.     Right lower leg: No edema.     Left lower leg: No edema.  Lymphadenopathy:     Upper Body:     Right upper body: No supraclavicular, axillary or pectoral adenopathy.     Left upper body: No supraclavicular, axillary or pectoral adenopathy.  Skin:    General: Skin is warm and dry.     Capillary Refill: Capillary refill takes less than 2 seconds.  Neurological:     General: No  focal deficit present.     Mental Status: She is alert and oriented to person, place, and time.     Cranial Nerves: No cranial nerve deficit.     Sensory: No sensory deficit.     Motor: No weakness.   Psychiatric:        Mood and Affect: Mood normal.        Behavior: Behavior normal.        Thought Content: Thought content normal.        Judgment: Judgment normal.         Assessment And Plan:     1. Encounter for health maintenance examination  2. Diabetes mellitus with stage 3 chronic kidney disease (HCC) Comments: HgbA1c is slightly elevated at last visit, continue taking medications as directed. - POCT Urinalysis Dipstick (81002) - Microalbumin / Creatinine Urine Ratio - Hemoglobin A1c - Semaglutide (RYBELSUS) 7 MG TABS; Take 1 tablet (7 mg total) by mouth daily.  Dispense: 90 tablet; Refill: 1  3. Mixed hyperlipidemia Comments: Cholesterol levels are stable. Continue statin, tolerating well. - Lipid panel  4. Atherosclerosis of aorta (HCC) Comments: Continue statin, tolerating well.  5. Hypertensive nephropathy Comments: Blood pressure is well controlled, Continue [name] medications. EKG done with SR -occasional ectopic ventricular beat, Rt atrial enlargement. Old inferior-apical infarct -consider old anterior infarct.  - EKG 12-Lead - CMP14+EGFR - CBC  6. Pulmonary emphysema, unspecified emphysema type (HCC) Comments: Continue inhalers as directed and f/u with Pulmonology  7. Need for Tdap vaccination - Tdap vaccine greater than or equal to 7yo IM  8. BMI 29.0-29.9,adult   Patient was given opportunity to ask questions. Patient verbalized understanding of the plan and was able to repeat key elements of the plan. All questions were answered to their satisfaction.   Shannon Felts, FNP   I, Shannon Felts, FNP, have reviewed all documentation for this visit. The documentation on 01/04/23 for the exam, diagnosis, procedures, and orders are all accurate and complete.   THE PATIENT IS ENCOURAGED TO PRACTICE SOCIAL DISTANCING DUE TO THE COVID-19 PANDEMIC.

## 2023-01-04 NOTE — Patient Instructions (Signed)

## 2023-01-05 LAB — CMP14+EGFR
ALT: 29 IU/L (ref 0–32)
AST: 41 IU/L — ABNORMAL HIGH (ref 0–40)
Albumin/Globulin Ratio: 1.6 (ref 1.2–2.2)
Albumin: 4.4 g/dL (ref 3.7–4.7)
Alkaline Phosphatase: 79 IU/L (ref 44–121)
BUN/Creatinine Ratio: 14 (ref 12–28)
BUN: 17 mg/dL (ref 8–27)
Bilirubin Total: 1.1 mg/dL (ref 0.0–1.2)
CO2: 24 mmol/L (ref 20–29)
Calcium: 10 mg/dL (ref 8.7–10.3)
Chloride: 100 mmol/L (ref 96–106)
Creatinine, Ser: 1.2 mg/dL — ABNORMAL HIGH (ref 0.57–1.00)
Globulin, Total: 2.8 g/dL (ref 1.5–4.5)
Glucose: 107 mg/dL — ABNORMAL HIGH (ref 70–99)
Potassium: 3.9 mmol/L (ref 3.5–5.2)
Sodium: 143 mmol/L (ref 134–144)
Total Protein: 7.2 g/dL (ref 6.0–8.5)
eGFR: 45 mL/min/{1.73_m2} — ABNORMAL LOW (ref >59)

## 2023-01-05 LAB — LIPID PANEL
Chol/HDL Ratio: 2.2 ratio (ref 0.0–4.4)
Cholesterol, Total: 112 mg/dL (ref 100–199)
HDL: 50 mg/dL (ref >39)
LDL Chol Calc (NIH): 44 mg/dL (ref 0–99)
Triglycerides: 92 mg/dL (ref 0–149)
VLDL Cholesterol Cal: 18 mg/dL (ref 5–40)

## 2023-01-05 LAB — CBC
Hematocrit: 47.7 % — ABNORMAL HIGH (ref 34.0–46.6)
Hemoglobin: 15.7 g/dL (ref 11.1–15.9)
MCH: 29.7 pg (ref 26.6–33.0)
MCHC: 32.9 g/dL (ref 31.5–35.7)
MCV: 90 fL (ref 79–97)
Platelets: 206 10*3/uL (ref 150–450)
RBC: 5.29 x10E6/uL — ABNORMAL HIGH (ref 3.77–5.28)
RDW: 14.5 % (ref 11.7–15.4)
WBC: 6 10*3/uL (ref 3.4–10.8)

## 2023-01-05 LAB — MICROALBUMIN / CREATININE URINE RATIO
Creatinine, Urine: 24 mg/dL
Microalb/Creat Ratio: 18 mg/g creat (ref 0–29)
Microalbumin, Urine: 4.2 ug/mL

## 2023-01-05 LAB — HEMOGLOBIN A1C
Est. average glucose Bld gHb Est-mCnc: 154 mg/dL
Hgb A1c MFr Bld: 7 % — ABNORMAL HIGH (ref 4.8–5.6)

## 2023-01-09 ENCOUNTER — Other Ambulatory Visit: Payer: Self-pay | Admitting: Nurse Practitioner

## 2023-01-09 DIAGNOSIS — E1122 Type 2 diabetes mellitus with diabetic chronic kidney disease: Secondary | ICD-10-CM

## 2023-01-11 ENCOUNTER — Other Ambulatory Visit (HOSPITAL_BASED_OUTPATIENT_CLINIC_OR_DEPARTMENT_OTHER): Payer: Self-pay

## 2023-01-11 DIAGNOSIS — J432 Centrilobular emphysema: Secondary | ICD-10-CM

## 2023-01-11 DIAGNOSIS — R0609 Other forms of dyspnea: Secondary | ICD-10-CM

## 2023-01-11 DIAGNOSIS — R059 Cough, unspecified: Secondary | ICD-10-CM

## 2023-01-11 DIAGNOSIS — J441 Chronic obstructive pulmonary disease with (acute) exacerbation: Secondary | ICD-10-CM

## 2023-01-12 ENCOUNTER — Ambulatory Visit (INDEPENDENT_AMBULATORY_CARE_PROVIDER_SITE_OTHER): Payer: Medicare HMO | Admitting: Pulmonary Disease

## 2023-01-12 ENCOUNTER — Encounter (HOSPITAL_BASED_OUTPATIENT_CLINIC_OR_DEPARTMENT_OTHER): Payer: Self-pay | Admitting: Pulmonary Disease

## 2023-01-12 VITALS — BP 122/70 | HR 72 | Ht 63.0 in | Wt 162.4 lb

## 2023-01-12 DIAGNOSIS — J432 Centrilobular emphysema: Secondary | ICD-10-CM | POA: Diagnosis not present

## 2023-01-12 DIAGNOSIS — R059 Cough, unspecified: Secondary | ICD-10-CM

## 2023-01-12 DIAGNOSIS — R0609 Other forms of dyspnea: Secondary | ICD-10-CM

## 2023-01-12 DIAGNOSIS — J441 Chronic obstructive pulmonary disease with (acute) exacerbation: Secondary | ICD-10-CM

## 2023-01-12 LAB — PULMONARY FUNCTION TEST
DL/VA % pred: 80 %
DL/VA: 3.29 ml/min/mmHg/L
DLCO cor % pred: 42 %
DLCO cor: 7.65 ml/min/mmHg
DLCO unc % pred: 44 %
DLCO unc: 8.14 ml/min/mmHg
FEF 25-75 Post: 0.47 L/sec
FEF 25-75 Pre: 0.48 L/sec
FEF2575-%Change-Post: -1 %
FEF2575-%Pred-Post: 36 %
FEF2575-%Pred-Pre: 37 %
FEV1-%Change-Post: 0 %
FEV1-%Pred-Post: 54 %
FEV1-%Pred-Pre: 55 %
FEV1-Post: 0.99 L
FEV1-Pre: 1 L
FEV1FVC-%Change-Post: 2 %
FEV1FVC-%Pred-Pre: 81 %
FEV6-%Change-Post: -3 %
FEV6-%Pred-Post: 69 %
FEV6-%Pred-Pre: 72 %
FEV6-Post: 1.61 L
FEV6-Pre: 1.66 L
FEV6FVC-%Pred-Post: 106 %
FEV6FVC-%Pred-Pre: 106 %
FVC-%Change-Post: -3 %
FVC-%Pred-Post: 65 %
FVC-%Pred-Pre: 67 %
FVC-Post: 1.61 L
FVC-Pre: 1.66 L
Post FEV1/FVC ratio: 62 %
Post FEV6/FVC ratio: 100 %
Pre FEV1/FVC ratio: 60 %
Pre FEV6/FVC Ratio: 100 %
RV % pred: 241 %
RV: 5.69 L
TLC % pred: 153 %
TLC: 7.51 L

## 2023-01-12 NOTE — Patient Instructions (Signed)
  X refer to pulm rehab

## 2023-01-12 NOTE — Assessment & Plan Note (Signed)
Related to hyperinflation. Discussed pursed lip breathing and other breathing exercises

## 2023-01-12 NOTE — Assessment & Plan Note (Signed)
Lung function has dropped over the past few years about 10%.  She has also gained weight and has severe deconditioning. She will benefit from a pulmonary rehab program will refer. She will continue on Trelegy and use albuterol for rescue. We discussed signs and symptoms of COPD exacerbation and plan for the same

## 2023-01-12 NOTE — Progress Notes (Signed)
   Subjective:    Patient ID: [name] Current, female    DOB: 1941-01-06, 82 y.o.   MRN: [id number]  HPI 82  yo ex-smoker for follow-up of COPD and lung cancer She smoked about 60 pack years. She worked in a [employer] educational needs teacher for 20 years   06/2015 underwent left VATS with left upper lobectomy and mediastinal lymph node dissection. Path  showed invasive adenocarcinoma,  adenocarcinoma involving the visceral pleura in addition to lymphovascular invasion. The resection margins as well as the dissected lymph nodes were negative for malignancy. She had persistent air leak postop and required endobronchial valves for resolution  Chief Complaint  Patient presents with   Follow-up    PFT performed today. Pt states she has been doing okay since last visit and denies any complaints.   She was treated with Z-Pak and prednisone for exacerbation last office visit 10/2022. She still complains of shortness of breath when she tries to do housework.  Otherwise is at baseline.  Denies cough or wheezing. She uses albuterol MDI 3-4 times a week We reviewed PFTs She is on Jardiance for hyperglycemia and semaglutide has been added  Significant tests/ events reviewed  05/2022 CT chest >> nodular scarring left lower lobe   05/2021 CT chest >> stable areas of scarring left upper lobe postsurgical,leftlower lobe nodular scarring 12/2017 CT chest >> stable, nodular area of left lung scarring 2016 Pre-op PFT - FEV1 73 %, DLCO 48%   03/2016 FEV1 66%   Spirometry 10/2017 shows an FEV1 at 63% ratio 57 and FVC of 86%. PFTs 01/2023 moderate airway obstruction, ratio 60, FEV1 55%, TLC 153%/hyperinflation, DLCO 8.1/44%  Review of Systems neg for any significant sore throat, dysphagia, itching, sneezing, nasal congestion or excess/ purulent secretions, fever, chills, sweats, unintended wt loss, pleuritic or exertional cp, hempoptysis, orthopnea pnd or change in chronic leg swelling. Also denies presyncope, palpitations,  heartburn, abdominal pain, nausea, vomiting, diarrhea or change in bowel or urinary habits, dysuria,hematuria, rash, arthralgias, visual complaints, headache, numbness weakness or ataxia.     Objective:   Physical Exam  Gen. Pleasant, well-nourished, in no distress ENT - no thrush, no pallor/icterus,no post nasal drip Neck: No JVD, no thyromegaly, no carotid bruits Lungs: no use of accessory muscles, no dullness to percussion, clear without rales or rhonchi  Cardiovascular: Rhythm regular, heart sounds  normal, no murmurs or gallops, no peripheral edema Musculoskeletal: No deformities, no cyanosis or clubbing        Assessment & Plan:

## 2023-01-12 NOTE — Patient Instructions (Signed)
Full PFT Performed Today  

## 2023-01-12 NOTE — Progress Notes (Signed)
Full PFT Performed Today  

## 2023-01-16 ENCOUNTER — Other Ambulatory Visit: Payer: Self-pay | Admitting: Pulmonary Disease

## 2023-01-17 ENCOUNTER — Ambulatory Visit: Payer: Self-pay

## 2023-01-17 ENCOUNTER — Encounter: Payer: Self-pay | Admitting: Nurse Practitioner

## 2023-01-17 NOTE — Patient Outreach (Signed)
Care Coordination   Follow Up Visit Note   01/17/2023 Name: [name] MRN: [id number] DOB: [birth year]  [name] is a 82 y.o. year old female who sees Arnette Felts, FNP for primary care. I spoke with  [name] by phone today.  What matters to the patients health and wellness today?  Patient would like to resume the PREP program. She would like to speak with the pharmacist about taking Rybelsus and Jardiance together.     Goals Addressed               This Visit's Progress     Patient Stated     I would like to work on lower my A1c (pt-stated)        Care Coordination Interventions: Provided education to patient about basic DM disease process Reviewed medications with patient and discussed importance of medication adherence Provided patient with written educational materials related to hypo and hyperglycemia and importance of correct treatment Advised patient, providing education and rationale, to check cbg daily before meals and at bedtime and record, calling PCP for findings outside established parameters Referral made to pharmacy team for assistance with reviewing and advising patient about when to take her Rybelsus and Jardiance  Review of patient status, including review of consultants reports, relevant laboratory and other test results, and medications completed Mailed printed educational materials related to The Diabetes diet; Using the Plate Method; Diabetes Zone Tool Lab Results  Component Value Date   HGBA1C 7.0 (H) 01/04/2023       Other     COMPLETED: To lower Cholesterol and Triglycerides        Care Coordination Interventions: Review of patient status, including review of consultant's reports, relevant laboratory and other test results, and medications completed Reviewed importance of limiting foods high in cholesterol Reviewed exercise goals and target of 150 minutes per week Determined patient put the PREP program on hold after  experiencing chest pain and palpitations  Reviewed next upcoming scheduled follow up with Cardiology  Determined patient plans to ask for clearance to resume the PREP program  Lipid Panel     Component Value Date/Time   CHOL 112 01/04/2023 1227   TRIG 92 01/04/2023 1227   HDL 50 01/04/2023 1227   CHOLHDL 2.2 01/04/2023 1227   LDLCALC 44 01/04/2023 1227   LABVLDL 18 01/04/2023 1227         To start pulmonary rehab as directed for decreased lung function        Care Coordination Interventions: Determined patient completed recent PFT and in person visit with Pulmonology Review of patient status, including review of consultant's reports, relevant laboratory and other test results, and medications completed Discussed Pulmonary Rehab and offered to assist with referral placement Determined MD placed referral for Pulmonary Rehab Educated patient about the referral process and next steps      Interventions Today    Flowsheet Row Most Recent Value  Chronic Disease   Chronic disease during today's visit Diabetes, Chronic Obstructive Pulmonary Disease (COPD), Other  [heart palpitations]  General Interventions   General Interventions Discussed/Reviewed General Interventions Discussed, General Interventions Reviewed, Labs, Lipid Profile, Doctor Visits  Doctor Visits Discussed/Reviewed Doctor Visits Discussed, Doctor Visits Reviewed, PCP, Specialist  Exercise Interventions   Exercise Discussed/Reviewed Exercise Discussed, Exercise Reviewed, Physical Activity  Physical Activity Discussed/Reviewed Types of exercise, Physical Activity Reviewed, Physical Activity Discussed, PREP  [pulmonary rehab]  Education Interventions   Education Provided Provided Education, Provided Printed Education  Provided Verbal  Education On Nutrition, Labs, Blood Sugar Monitoring, Medication, Exercise, When to see the doctor  Labs Reviewed Hgb A1c, Kidney Function, Lipid Profile  Nutrition Interventions   Nutrition  Discussed/Reviewed Nutrition Discussed, Nutrition Reviewed, Carbohydrate meal planning, Fluid intake, Portion sizes  Pharmacy Interventions   Pharmacy Dicussed/Reviewed Pharmacy Topics Discussed, Pharmacy Topics Reviewed, Medications and their functions, Medication Adherence, Referral to Pharmacist  Referral to Pharmacist Drug interaction/side effects          SDOH assessments and interventions completed:  No     Care Coordination Interventions:  Yes, provided   Follow up plan: Referral made to Pharmacy team to review and advise patient about dosage and frequency of Rybelsus Follow up call scheduled for 03/01/23 @1 :30 PM    Encounter Outcome:  Pt. Visit Completed

## 2023-01-17 NOTE — Patient Instructions (Signed)
Visit Information  Thank you for taking time to visit with me today. Please don't hesitate to contact me if I can be of assistance to you.   Following are the goals we discussed today:   Goals Addressed               This Visit's Progress     Patient Stated     I would like to work on lower my A1c (pt-stated)        Care Coordination Interventions: Provided education to patient about basic DM disease process Reviewed medications with patient and discussed importance of medication adherence Provided patient with written educational materials related to hypo and hyperglycemia and importance of correct treatment Advised patient, providing education and rationale, to check cbg daily before meals and at bedtime and record, calling PCP for findings outside established parameters Referral made to pharmacy team for assistance with reviewing and advising patient about when to take her Rybelsus and Jardiance  Review of patient status, including review of consultants reports, relevant laboratory and other test results, and medications completed Mailed printed educational materials related to The Diabetes diet; Using the Plate Method; Diabetes Zone Tool Lab Results  Component Value Date   HGBA1C 7.0 (H) 01/04/2023           Other     COMPLETED: To lower Cholesterol and Triglycerides        Care Coordination Interventions: Review of patient status, including review of consultant's reports, relevant laboratory and other test results, and medications completed Reviewed importance of limiting foods high in cholesterol Reviewed exercise goals and target of 150 minutes per week Determined patient put the PREP program on hold after experiencing chest pain and palpitations  Reviewed next upcoming scheduled follow up with Cardiology  Determined patient plans to ask for clearance to resume the PREP program  Lipid Panel     Component Value Date/Time   CHOL 112 01/04/2023 1227   TRIG 92  01/04/2023 1227   HDL 50 01/04/2023 1227   CHOLHDL 2.2 01/04/2023 1227   LDLCALC 44 01/04/2023 1227   LABVLDL 18 01/04/2023 1227         To start pulmonary rehab as directed for decreased lung function        Care Coordination Interventions: Determined patient completed recent PFT and in person visit with Pulmonology Review of patient status, including review of consultant's reports, relevant laboratory and other test results, and medications completed Discussed Pulmonary Rehab and offered to assist with referral placement Determined MD placed referral for Pulmonary Rehab Educated patient about the referral process and next steps            Our next appointment is by telephone on 03/01/23 at 1:30 PM  Please call the care guide team at [phone] if you need to cancel or reschedule your appointment.   If you are experiencing a Mental Health or Behavioral Health Crisis or need someone to talk to, please call 1-800-273-TALK (toll free, 24 hour hotline) go to [facility] [address] [place] [phone])  The patient verbalized understanding of instructions, educational materials, and care plan provided today and DECLINED offer to receive copy of patient instructions, educational materials, and care plan.   Delsa Sale, RN, BSN, CCM Care Management Coordinator [facility] Care Management Direct Phone: [phone]

## 2023-01-18 ENCOUNTER — Other Ambulatory Visit: Payer: Self-pay | Admitting: Internal Medicine

## 2023-01-18 ENCOUNTER — Telehealth: Payer: Self-pay

## 2023-01-18 DIAGNOSIS — J439 Emphysema, unspecified: Secondary | ICD-10-CM

## 2023-01-18 DIAGNOSIS — I700 Atherosclerosis of aorta: Secondary | ICD-10-CM

## 2023-01-18 DIAGNOSIS — E1122 Type 2 diabetes mellitus with diabetic chronic kidney disease: Secondary | ICD-10-CM

## 2023-01-18 DIAGNOSIS — I129 Hypertensive chronic kidney disease with stage 1 through stage 4 chronic kidney disease, or unspecified chronic kidney disease: Secondary | ICD-10-CM

## 2023-01-18 NOTE — Progress Notes (Signed)
Care Management & Coordination Services Pharmacy Team  Reason for Encounter: Appointment Reminder  Contacted patient to confirm in office appointment with [name], PharmD on 01-19-2023 at 3:00. No further action needed  Chart review: Recent office visits:  01-17-2023 Little, Shannon Lew, RN [facility]).  01-04-2023 Shannon Felts, FNP (PCP). Visit for annual exam. Tdap completed. Start Rybelsus 7 mg daily.  11-03-2022 Little, Shannon Lew, RN [facility]).  10-20-2022 Little, Shannon Lew, RN [facility]).  10-18-2022 Shannon Felts, FNP. Visit for Early satiety. Completed prednisone. Start amitiza 8 mcg daily with breakfast. Bone density and DG Abd 1 view ordered.  09-30-2022 Shannon Merino, LPN. Medicare wellness visit.  09-14-2022 Little, Shannon Lew, RN [facility]).  Recent consult visits:  01-12-2023 Shannon Milch, MD (Pulmonary). Follow up visit for Centrilobular emphysema and dyspnea on exertion. Pulmonary function test completed. Referral placed to pulmonary rehab.   10-08-2022 Parrett, Shannon Bouquet, NP (Pulmonary). Follow up for dyspnea on exertion. Start Prednisone 20 mg daily and zithromax 250 mg  Take 2 tablets (500 mg) on  Day 1,  followed by 1 tablet (250 mg) once daily on Days 2 through 5. Pulmonary function test completed.  08-04-2022 Shannon Odor, MD. Mammogram completed.  Hospital visits:  None in previous 6 months  Star Rating Drugs:  Rosuvastatin 10 mg- Last filled 10-29-2022 90 [facility]. No previous fills Rybelsus 7 mg- Last filled 01-04-2023 90 DS [facility]. No previous fills Jardiance 10 mg- Last filled 01-10-2023 30 [facility]. Previous 12-13-2022 30 DS  Care Gaps: Annual wellness visit in last year? Yes Shingrix overdue Covid booster overdue  If Diabetic: Last eye exam / retinopathy screening: 02-25-2022 Last diabetic foot exam: None   Huey Romans [facility] Clinical Pharmacist Assistant [phone]

## 2023-01-19 ENCOUNTER — Other Ambulatory Visit: Payer: Self-pay | Admitting: Nurse Practitioner

## 2023-01-19 ENCOUNTER — Telehealth: Payer: Self-pay

## 2023-01-19 ENCOUNTER — Ambulatory Visit: Payer: Medicare HMO

## 2023-01-19 NOTE — Telephone Encounter (Signed)
Spoke with patient briefly, she would prefer to have a phone visit because she does not like to drive in the rain, and she will be able to save some gas.

## 2023-01-19 NOTE — Progress Notes (Unsigned)
Care Management & Coordination Services Pharmacy Note  01/19/2023 Name:  [name] MRN:  [id number] DOB:  [birth year]  Summary: Patient would like to lose weight but she is not sure how she will be able to lose weight right now.   Recommendations/Changes made from today's visit: Recommend patient start taking Rybelsus 7 mg tablet once per day for a month   Follow up plan: Patient to start taking Rybelsus 7 mg tablet daily and let us know how she responds to the medication.    Subjective: [name] is an 82 y.o. year old female who is a primary patient of Arnette Felts, FNP.  The care coordination team was consulted for assistance with disease management and care coordination needs.    Engaged with patient by telephone for initial visit. She is from [place], [place] and she moved here to [place] in 1997. 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 The youngest is 66-64. She has 11 grand children   Recent office visits: ***  Recent consult visits: ***  Hospital visits: {Hospital DC Yes/No:25215}   Objective:  Lab Results  Component Value Date   CREATININE 1.20 (H) 01/04/2023   BUN 17 01/04/2023   EGFR 45 (L) 01/04/2023   GFRNONAA 52 (L) 05/28/2022   GFRAA 67 07/01/2020   NA 143 01/04/2023   K 3.9 01/04/2023   CALCIUM 10.0 01/04/2023   CO2 24 01/04/2023   GLUCOSE 107 (H) 01/04/2023    Lab Results  Component Value Date/Time   HGBA1C 7.0 (H) 01/04/2023 12:03 PM   HGBA1C 6.9 (H) 10/18/2022 03:27 PM   HGBA1C 6.6 12/08/2017 12:00 AM   MICROALBUR 150 08/24/2021 12:00 PM   MICROALBUR 10 03/31/2020 02:16 PM    Last diabetic Eye exam:  Lab Results  Component Value Date/Time   HMDIABEYEEXA No Retinopathy 02/25/2022 12:00 AM    Last diabetic Foot exam: No results found for: "HMDIABFOOTEX"   Lab Results  Component Value Date   CHOL 112 01/04/2023   HDL 50 01/04/2023   LDLCALC 44 01/04/2023   TRIG 92 01/04/2023   CHOLHDL 2.2 01/04/2023       Latest  Ref Rng & Units 01/04/2023   12:03 PM 05/28/2022   11:48 AM 12/30/2021   12:40 PM  Hepatic Function  Total Protein 6.0 - 8.5 g/dL 7.2  7.5  6.9   Albumin 3.7 - 4.7 g/dL 4.4  4.3  4.3   AST 0 - 40 IU/L 41  27  28   ALT 0 - 32 IU/L 29  22  24    Alk Phosphatase 44 - 121 IU/L 79  73  89   Total Bilirubin 0.0 - 1.2 mg/dL 1.1  0.6  0.4     Lab Results  Component Value Date/Time   TSH 2.220 05/03/2022 12:25 PM   TSH 1.500 07/01/2020 12:47 PM   FREET4 1.04 05/25/2014 09:50 AM       Latest Ref Rng & Units 01/04/2023   12:03 PM 10/18/2022    3:27 PM 05/28/2022   11:48 AM  CBC  WBC 3.4 - 10.8 x10E3/uL 6.0  9.5  7.0   Hemoglobin 11.1 - 15.9 g/dL 78.2  95.6  21.3   Hematocrit 34.0 - 46.6 % 47.7  48.5  44.3   Platelets 150 - 450 x10E3/uL 206  257  212     Lab Results  Component Value Date/Time   VD25OH 63.7 05/03/2022 12:25 PM   VD25OH 32.7 07/01/2020 12:47 PM  UEAVWUJW11 859 05/03/2022 12:25 PM   VITAMINB12 989 10/18/2018 04:12 PM    Clinical ASCVD: {YES/NO:21197} The ASCVD Risk score (Arnett DK, et al., 2019) failed to calculate for the following reasons:   The 2019 ASCVD risk score is only valid for ages 22 to 92    ***Other: (CHADS2VASc if Afib, MMRC or CAT for COPD, ACT, DEXA)     01/04/2023   11:07 AM 10/18/2022    2:33 PM 09/30/2022   10:41 AM  Depression screen PHQ 2/9  Decreased Interest 0 0 0  Down, Depressed, Hopeless 0 0 0  PHQ - 2 Score 0 0 0     Social History   Tobacco Use  Smoking Status Former   Packs/day: 1.00   Years: 56.00   Additional pack years: 0.00   Total pack years: 56.00   Types: Cigarettes   Quit date: 06/22/2015   Years since quitting: 7.5  Smokeless Tobacco Never   BP Readings from Last 3 Encounters:  01/12/23 122/70  01/04/23 118/64  10/18/22 112/78   Pulse Readings from Last 3 Encounters:  01/12/23 72  01/12/23 67  01/04/23 89   Wt Readings from Last 3 Encounters:  01/12/23 162 lb 6.4 oz (73.7 kg)  01/12/23 162 lb 6.4 oz  (73.7 kg)  01/04/23 160 lb 12.8 oz (72.9 kg)   BMI Readings from Last 3 Encounters:  01/12/23 28.77 kg/m  01/12/23 28.77 kg/m  01/04/23 29.22 kg/m    No Known Allergies  Medications Reviewed Today     Reviewed by Arnette Felts, FNP (Family Nurse Practitioner) on 01/17/23 at 1606  Med List Status: <None>   Medication Order Taking? Sig Documenting Provider Last Dose Status Informant  albuterol (PROVENTIL) (2.5 MG/3ML) 0.083% nebulizer solution [id number] Yes Take 3 mLs (2.5 mg total) by nebulization every 6 (six) hours as needed for wheezing or shortness of breath. Parrett, Virgel Bouquet, NP Taking Active   albuterol (VENTOLIN HFA) 108 (90 Base) MCG/ACT inhaler [id number]  INHALE 2 PUFFS BY MOUTH EVERY 6 HOURS AS NEEDED FOR WHEEZE OR SHORTNESS OF Gordy Savers, MD  Active   aspirin 81 MG tablet [id number] Yes Take 81 mg by mouth every evening.  [provider] Taking Active Self  BIOTIN 5000 PO [id number] Yes Take 1 capsule by mouth daily at 2 am. [provider] Taking Active   Blood Glucose Monitoring Suppl (TRUE METRIX AIR GLUCOSE METER) w/Device KIT [id number] Yes Use to check blood sugar 4 times a day. Dx code e11.65 Arnette Felts, FNP Taking Active   Calcium Carbonate-Vitamin D 600-400 MG-UNIT tablet [id number] Yes Take 1 tablet by mouth daily.  Patient taking differently: Take 1 tablet by mouth daily. 600 mg of calcium and 500 units of vitd   Dorothyann Peng, MD Taking Active   cycloSPORINE (RESTASIS) 0.05 % ophthalmic emulsion [id number] Yes 1 drop 2 (two) times daily. [provider] Taking Active   donepezil (ARICEPT) 10 MG tablet [id number] Yes TAKE 1 TABLET BY MOUTH EVERY DAY IN THE Tamala Julian, Lolita Cram, FNP Taking Active   ezetimibe (ZETIA) 10 MG tablet [id number] Yes TAKE 1 TABLET BY MOUTH EVERYDAY AT BEDTIME Arnette Felts, FNP Taking Active   flunisolide (NASALIDE) 25 MCG/ACT (0.025%) SOLN [id number] Yes PLACE 2 SPRAYS INTO THE NOSE 2 (TWO) TIMES DAILY.  Arnette Felts, FNP Taking Active   glucose blood (TRUE METRIX BLOOD GLUCOSE TEST) test strip [id number] Yes USE AS INSTRUCTED TO CHECK BLOOD SUGAR TWICE DAILY Arnette Felts, FNP Taking Active  JARDIANCE 10 MG TABS tablet [id number]  TAKE 1 TABLET BY MOUTH EVERY DAY BEFORE Golden Hurter, FNP  Active   KLOR-CON M20 20 MEQ tablet [id number] Yes TAKE 1 TABLET BY MOUTH EVERY DAY WITH FOOD Arnette Felts, FNP Taking Active   lubiprostone (AMITIZA) 8 MCG capsule [id number] Yes Take 1 capsule (8 mcg total) by mouth daily with breakfast. Arnette Felts, FNP Taking Active   magnesium oxide (MAG-OX) 400 MG tablet [id number] Yes Take 400 mg by mouth daily. [provider] Taking Active   Melatonin 2.5 MG CAPS [id number] Yes Take 2.5 mg by mouth at bedtime as needed (sleep).  [provider] Taking Active Self  mirtazapine (REMERON) 15 MG tablet [id number] Yes TAKE 1 TABLET BY MOUTH EVERYDAY AT BEDTIME Arnette Felts, FNP Taking Active   Multiple Vitamin (MULTIVITAMIN) tablet [id number] Yes Take 1 tablet by mouth daily. [provider] Taking Active Self  niacin (NIASPAN) 500 MG CR tablet [id number] Yes TAKE 1 TABLET BY MOUTH EVERY DAY IN THE Juanell Fairly, FNP Taking Active   OVER THE COUNTER MEDICATION [id number] Yes Take 1 tablet by mouth daily. [provider] Taking Active Self           Med Note Charma Igo   Thu Jun 23, 2017 11:19 AM) "Mega red 4 in 1"  pantoprazole (PROTONIX) 40 MG tablet [id number] Yes TAKE 1 TABLET BY MOUTH TWICE A Vedia Pereyra, FNP Taking Active   Probiotic Product (PROBIOTIC PO) [id number] Yes Take by mouth. [provider] Taking Active   rosuvastatin (CRESTOR) 10 MG tablet [id number] Yes Take 1 tablet (10 mg total) by mouth daily. Arnette Felts, FNP Taking Active   Semaglutide [facility]) 7 MG TABS [id number] Yes Take 1 tablet (7 mg total) by mouth daily. Arnette Felts, FNP  Active   TRELEGY ELLIPTA 100-62.5-25 MCG/ACT AEPB  [id number] Yes INHALE 1 PUFF ONE TIME DAILY Oretha Milch, MD Taking Active   TRUEplus Lancets 33G MISC [id number] Yes TEST BLOOD SUGAR AS DIRECTED Arnette Felts, FNP Taking Active   valACYclovir (VALTREX) 500 MG tablet [id number] Yes TAKE 1 TABLET (500 MG TOTAL) BY MOUTH DAILY. Arnette Felts, FNP Taking Active   White Petrolatum-Mineral Oil (SYSTANE NIGHTTIME OP) [id number] Yes Place 1 drop into both eyes at bedtime as needed (for dry eyes).  [provider] Taking Active Self           Med Note Erick Alley Sep 25, 2018  4:03 PM)    Med List Note Mariam Dollar, [place] 02/22/19 1117): caltrate              SDOH:  (Social Determinants of Health) assessments and interventions performed: {yes/no:20286} SDOH Interventions    Flowsheet Row Clinical Support from 09/30/2022 in [facility] Care Coordination from 06/25/2022 in [facility] Coord[facility] Clinical Support from 12/27/2019 in [facility] Clinical Support from 05/03/2019 in [facility]  SDOH Interventions      Food Insecurity Interventions Intervention Not Indicated -- -- --  Transportation Interventions Intervention Not Indicated Intervention Not Indicated -- --  Depression Interventions/Treatment  -- -- POE4-2 Score <4 Follow-up Not Indicated PHQ2-9 Score <4 Follow-up Not Indicated  Financial Strain Interventions Intervention Not Indicated -- -- --  Physical Activity Interventions Patient Refused, Other (Comments) -- -- --  Stress Interventions Intervention Not Indicated -- -- --       Medication Assistance: {MEDASSISTANCEINFO:25044}  Medication Access: Within the past 30 days, how often has patient missed a dose of medication? *** Is a pillbox or other method used to improve adherence? {YES/NO:21197} Factors that may affect medication adherence? {CHL DESC; BARRIERS:21522} Are  meds synced by current pharmacy? {YES/NO:21197} Are meds delivered by current pharmacy? {YES/NO:21197} Does patient experience delays in picking up medications due to transportation concerns? {YES/NO:21197}  Name and location of Current pharmacy:  [facility] [phone] Ginette Otto, [place] - [address] [place] [address] Phone: [phone] Fax: [phone]  [facility] - [place], [place] - [address] [place] [address] Phone: [phone] Fax: [phone]   Compliance/Adherence/Medication fill history: Care Gaps: ***  Star-Rating Drugs: ***   Assessment/Plan   Diabetes (A1c goal <8%) -Controlled -Current medications:  Rybelsus 7 mg tablet once per day Appropriate, Query effective,  Jardiance 10 mg tablet once per day Appropriate, Effective, Safe, Accessible -Current home glucose readings fasting glucose: 101-130 -Denies hypoglycemic/hyperglycemic symptoms -Current meal patterns:  breakfast: ***  lunch: it depends sometimes a burger and fries just depends on how she is feeling at the moment   dinner: salad with chicken, sometimes pasta snacks: she is not eating any snacks  drinks: *** -Current exercise: will discuss further during next visit.  -Educated on Complications of diabetes including kidney damage, retinal damage, and cardiovascular disease; Exercise goal of 150 minutes per week; Benefits of routine self-monitoring of blood sugar; -She is going to start taking her medication at the same time each day for the next month to see how she feels about the medication.  -Counseled to check feet daily and get yearly eye exams -Recommended to continue current medication  Cherylin Mylar, CPP, PharmD Clinical Pharmacist Practitioner [facility] [phone]

## 2023-01-27 ENCOUNTER — Telehealth (HOSPITAL_COMMUNITY): Payer: Self-pay

## 2023-01-27 ENCOUNTER — Encounter (HOSPITAL_COMMUNITY): Payer: Self-pay

## 2023-01-27 NOTE — Telephone Encounter (Signed)
Attempted to call patient in regards to Pulmonary Rehab - LM on VM Mailed letter 

## 2023-01-28 ENCOUNTER — Telehealth (HOSPITAL_COMMUNITY): Payer: Self-pay

## 2023-01-28 NOTE — Telephone Encounter (Signed)
Pt returned PR phone call and stated she is interested. Will pass to RN.

## 2023-01-30 ENCOUNTER — Other Ambulatory Visit: Payer: Self-pay | Admitting: Nurse Practitioner

## 2023-02-11 ENCOUNTER — Other Ambulatory Visit: Payer: Self-pay | Admitting: Nurse Practitioner

## 2023-02-22 DIAGNOSIS — H35033 Hypertensive retinopathy, bilateral: Secondary | ICD-10-CM | POA: Diagnosis not present

## 2023-02-22 DIAGNOSIS — E119 Type 2 diabetes mellitus without complications: Secondary | ICD-10-CM | POA: Diagnosis not present

## 2023-02-22 DIAGNOSIS — H43811 Vitreous degeneration, right eye: Secondary | ICD-10-CM | POA: Diagnosis not present

## 2023-02-22 DIAGNOSIS — H35362 Drusen (degenerative) of macula, left eye: Secondary | ICD-10-CM | POA: Diagnosis not present

## 2023-02-23 ENCOUNTER — Encounter (HOSPITAL_COMMUNITY)
Admission: RE | Admit: 2023-02-23 | Discharge: 2023-02-23 | Disposition: A | Discharge status: Home / Self Care | Payer: Medicare HMO | Source: Ambulatory Visit | Attending: Pulmonary Disease | Admitting: Pulmonary Disease

## 2023-02-23 ENCOUNTER — Encounter (HOSPITAL_COMMUNITY): Payer: Self-pay

## 2023-02-23 VITALS — BP 130/62 | HR 79 | Ht 63.0 in | Wt 160.1 lb

## 2023-02-23 DIAGNOSIS — J449 Chronic obstructive pulmonary disease, unspecified: Secondary | ICD-10-CM | POA: Diagnosis not present

## 2023-02-23 LAB — GLUCOSE, CAPILLARY: Glucose-Capillary: 104 mg/dL — ABNORMAL HIGH (ref 70–99)

## 2023-02-23 NOTE — Progress Notes (Signed)
[name] 82 y.o. female  Initial Psychosocial Assessment  Pt psychosocial assessment reveals pt lives alone. Pt is currently retired. Pt hobbies include gardening. Pt reports her stress level is low. Pt states her stress is low,areas of stress/anxiety include health.  Pt does not exhibit signs of depression. Pt has history of depression, but states her mental health has been stable. PHQ-9 score was 0. Pt shows good  coping skills with positive outlook . Offered emotional support and reassurance. Monitor and evaluate progress toward psychosocial goal(s).  Goal(s): Improved management of stress Improved coping skills Help patient work toward returning to meaningful activities that improve patient's QOL and are attainable with patient's lung disease   02/23/2023 11:47 AM

## 2023-02-23 NOTE — Progress Notes (Signed)
Pulmonary Individual Treatment Plan  Patient Details  Name: [name] MRN: [id number] Date of Birth: [birth year] Referring Provider:   [name] Pulmonary Rehab Walk Test from 02/23/2023 in [facility] for Heart, Vascular, & Lung Health  Referring Provider [name]       Initial Encounter Date:  Flowsheet Row Pulmonary Rehab Walk Test from 02/23/2023 in [facility] for Heart, Vascular, & Lung Health  Date 02/23/23       Visit Diagnosis: Stage 2 moderate COPD by GOLD classification (HCC)  Patient's Home Medications on Admission:   Current Outpatient Medications:    albuterol (PROVENTIL) (2.5 MG/3ML) 0.083% nebulizer solution, Take 3 mLs (2.5 mg total) by nebulization every 6 (six) hours as needed for wheezing or shortness of breath., Disp: 75 mL, Rfl: 12   albuterol (VENTOLIN HFA) 108 (90 Base) MCG/ACT inhaler, INHALE 2 PUFFS BY MOUTH EVERY 6 HOURS AS NEEDED FOR WHEEZE OR SHORTNESS OF BREATH, Disp: 18 each, Rfl: 2   aspirin 81 MG tablet, Take 81 mg by mouth every evening. , Disp: , Rfl:    BIOTIN 5000 PO, Take 1 capsule by mouth daily at 2 am. Takes around 2PM, Disp: , Rfl:    Blood Glucose Monitoring Suppl (TRUE METRIX AIR GLUCOSE METER) w/Device KIT, Use to check blood sugar 4 times a day. Dx code e11.65, Disp: 1 kit, Rfl: 2   Calcium Carbonate-Vitamin D 600-400 MG-UNIT tablet, Take 1 tablet by mouth daily. (Patient taking differently: Take 1 tablet by mouth daily. 600 mg of calcium and 500 units of vitd), Disp:  , Rfl:    cycloSPORINE (RESTASIS) 0.05 % ophthalmic emulsion, 1 drop 2 (two) times daily., Disp: , Rfl:    donepezil (ARICEPT) 10 MG tablet, TAKE 1 TABLET BY MOUTH EVERY DAY IN THE EVENING, Disp: 90 tablet, Rfl: 1   ezetimibe (ZETIA) 10 MG tablet, TAKE 1 TABLET BY MOUTH EVERYDAY AT BEDTIME, Disp: 90 tablet, Rfl: 1   glucose blood (TRUE METRIX BLOOD GLUCOSE TEST) test strip, USE AS INSTRUCTED TO CHECK BLOOD SUGAR TWICE DAILY,  Disp: 200 strip, Rfl: 2   JARDIANCE 10 MG TABS tablet, TAKE 1 TABLET BY MOUTH EVERY DAY BEFORE BREAKFAST, Disp: 30 tablet, Rfl: 2   KLOR-CON M20 20 MEQ tablet, TAKE 1 TABLET BY MOUTH EVERY DAY WITH FOOD, Disp: 90 tablet, Rfl: 1   lubiprostone (AMITIZA) 8 MCG capsule, Take 1 capsule (8 mcg total) by mouth daily with breakfast., Disp: 90 capsule, Rfl: 1   magnesium oxide (MAG-OX) 400 MG tablet, Take 400 mg by mouth daily., Disp: , Rfl:    Melatonin 2.5 MG CAPS, Take 2.5 mg by mouth at bedtime as needed (sleep). , Disp: , Rfl:    mirtazapine (REMERON) 15 MG tablet, TAKE 1 TABLET BY MOUTH EVERYDAY AT BEDTIME, Disp: 90 tablet, Rfl: 1   Multiple Vitamin (MULTIVITAMIN) tablet, Take 1 tablet by mouth daily., Disp: , Rfl:    niacin (VITAMIN B3) 500 MG ER tablet, TAKE 1 TABLET BY MOUTH EVERY DAY IN THE EVENING, Disp: 90 tablet, Rfl: 3   OVER THE COUNTER MEDICATION, Take 1 tablet by mouth daily., Disp: , Rfl:    pantoprazole (PROTONIX) 40 MG tablet, TAKE 1 TABLET BY MOUTH TWICE A DAY, Disp: 180 tablet, Rfl: 1   Probiotic Product (PROBIOTIC PO), Take by mouth., Disp: , Rfl:    rosuvastatin (CRESTOR) 10 MG tablet, Take 1 tablet (10 mg total) by mouth daily., Disp: 90 tablet, Rfl: 1  TRELEGY ELLIPTA 100-62.5-25 MCG/ACT AEPB, INHALE 1 PUFF ONE TIME DAILY, Disp: 180 each, Rfl: 3   TRUEplus Lancets 33G MISC, TEST BLOOD SUGAR AS DIRECTED, Disp: 400 each, Rfl: 3   valACYclovir (VALTREX) 500 MG tablet, TAKE 1 TABLET (500 MG TOTAL) BY MOUTH DAILY., Disp: 90 tablet, Rfl: 0   White Petrolatum-Mineral Oil (SYSTANE NIGHTTIME OP), Place 1 drop into both eyes at bedtime as needed (for dry eyes). , Disp: , Rfl:    flunisolide (NASALIDE) 25 MCG/ACT (0.025%) SOLN, PLACE 2 SPRAYS INTO THE NOSE 2 (TWO) TIMES DAILY. (Patient not taking: Reported on 02/23/2023), Disp: 25 mL, Rfl: 1   Semaglutide (RYBELSUS) 7 MG TABS, Take 1 tablet (7 mg total) by mouth daily. (Patient not taking: Reported on 02/23/2023), Disp: 90 tablet, Rfl:  1  Past Medical History: Past Medical History:  Diagnosis Date   Arthritis    Asthma    COPD (chronic obstructive pulmonary disease) (HCC)    Depression    Diabetes mellitus without complication (HCC)    Diverticulitis    GERD (gastroesophageal reflux disease)    Hyperlipidemia    Hypertension 06/15/2016   left lung ca dx'd 2016   Thyroid nodule     Tobacco Use: Social History   Tobacco Use  Smoking Status Former   Packs/day: 1.00   Years: 56.00   Additional pack years: 0.00   Total pack years: 56.00   Types: Cigarettes   Quit date: 06/22/2015   Years since quitting: 7.6  Smokeless Tobacco Never    Labs: Review Flowsheet  More data exists      Latest Ref Rng & Units 08/24/2021 12/30/2021 05/03/2022 10/18/2022 01/04/2023  Labs for ITP Cardiac and Pulmonary Rehab  Cholestrol 100 - 199 mg/dL 161  096  - 045  409   LDL (calc) 0 - 99 mg/dL 811  914  - 782  44   HDL-C >39 mg/dL 47  45  - 48  50   Trlycerides 0 - 149 mg/dL 956  213  - 086  92   Hemoglobin A1c 4.8 - 5.6 % 6.6  6.9  6.9  6.9  7.0     Capillary Blood Glucose: Lab Results  Component Value Date   GLUCAP 104 (H) 02/23/2023   GLUCAP 108 (H) 08/21/2017   GLUCAP 90 07/12/2015   GLUCAP 158 (H) 07/06/2015   GLUCAP 106 (H) 07/06/2015    POCT Glucose     Row Name 02/23/23 1120 02/23/23 1230           POCT Blood Glucose   Pre-Exercise 125 mg/dL --      Post-Exercise -- 103 mg/dL               Pulmonary Assessment Scores:  Pulmonary Assessment Scores     Row Name 02/23/23 1142         ADL UCSD   SOB Score total 28       CAT Score   CAT Score 18       mMRC Score   mMRC Score 3             UCSD: Self-administered rating of dyspnea associated with activities of daily living (ADLs) 6-point scale (0 = "not at all" to 5 = "maximal or unable to do because of breathlessness")  Scoring Scores range from 0 to 120.  Minimally important difference is 5 units  CAT: CAT can identify the  health impairment of COPD patients and is better correlated with disease progression.  CAT has a scoring range of zero to 40. The CAT score is classified into four groups of low (less than 10), medium (10 - 20), high (21-30) and very high (31-40) based on the impact level of disease on health status. A CAT score over 10 suggests significant symptoms.  A worsening CAT score could be explained by an exacerbation, poor medication adherence, poor inhaler technique, or progression of COPD or comorbid conditions.  CAT MCID is 2 points  mMRC: mMRC (Modified Medical Research Council) Dyspnea Scale is used to assess the degree of baseline functional disability in patients of respiratory disease due to dyspnea. No minimal important difference is established. A decrease in score of 1 point or greater is considered a positive change.   Pulmonary Function Assessment:  Pulmonary Function Assessment - 02/23/23 1142       Breath   Bilateral Breath Sounds Wheezes    Shortness of Breath Yes;Limiting activity             Exercise Target Goals: Exercise Program Goal: Individual exercise prescription set using results from initial 6 min walk test and THRR while considering  patient's activity barriers and safety.   Exercise Prescription Goal: Initial exercise prescription builds to 30-45 minutes a day of aerobic activity, 2-3 days per week.  Home exercise guidelines will be given to patient during program as part of exercise prescription that the participant will acknowledge.  Activity Barriers & Risk Stratification:  Activity Barriers & Cardiac Risk Stratification - 02/23/23 1124       Activity Barriers & Cardiac Risk Stratification   Activity Barriers Back Problems;Other (comment);Deconditioning;Muscular Weakness;Shortness of Breath    Comments left shoulder pain             6 Minute Walk:  6 Minute Walk     Row Name 02/23/23 1411         6 Minute Walk   Phase Initial     Distance  885 feet     Walk Time 6 minutes     # of Rest Breaks 0     MPH 1.68     METS 1.6     RPE 10     Perceived Dyspnea  1     VO2 Peak 5.6     Resting HR 76 bpm     Resting BP 130/62     Resting Oxygen Saturation  97 %     Exercise Oxygen Saturation  during 6 min walk 91 %     Max Ex. HR 103 bpm     Max Ex. BP 146/68     2 Minute Post BP 124/60       Interval HR   1 Minute HR 80     2 Minute HR 93     3 Minute HR 96     4 Minute HR 98     5 Minute HR 100     6 Minute HR 103     2 Minute Post HR 81     Interval Heart Rate? Yes       Interval Oxygen   Interval Oxygen? Yes     Baseline Oxygen Saturation % 97 %     1 Minute Oxygen Saturation % 96 %     1 Minute Liters of Oxygen 0 L     2 Minute Oxygen Saturation % 92 %     2 Minute Liters of Oxygen 0 L     3 Minute Oxygen Saturation % 92 %  3 Minute Liters of Oxygen 0 L     4 Minute Oxygen Saturation % 91 %     4 Minute Liters of Oxygen 0 L     5 Minute Oxygen Saturation % 92 %     5 Minute Liters of Oxygen 0 L     6 Minute Oxygen Saturation % 92 %     6 Minute Liters of Oxygen 0 L     2 Minute Post Oxygen Saturation % 95 %     2 Minute Post Liters of Oxygen 0 L              Oxygen Initial Assessment:   Oxygen Re-Evaluation:   Oxygen Discharge (Final Oxygen Re-Evaluation):   Initial Exercise Prescription:  Initial Exercise Prescription - 02/23/23 1200       Date of Initial Exercise RX and Referring Provider   Date 02/23/23    Referring Provider [name]    Expected Discharge Date 05/19/23      NuStep   Level 1    SPM 60    Minutes 15    METs 2.5      Track   Minutes 15    METs 2.5      Prescription Details   Frequency (times per week) 2    Duration Progress to 30 minutes of continuous aerobic without signs/symptoms of physical distress      Intensity   THRR 40-80% of Max Heartrate 56-111    Ratings of Perceived Exertion 11-13    Perceived Dyspnea 0-4      Progression   Progression  Continue progressive overload as per policy without signs/symptoms or physical distress.      Resistance Training   Training Prescription Yes    Weight red bands    Reps 10-15             Perform Capillary Blood Glucose checks as needed.  Exercise Prescription Changes:   Exercise Comments:   Exercise Goals and Review:   Exercise Goals     Row Name 02/23/23 1125             Exercise Goals   Increase Physical Activity Yes       Intervention Provide advice, education, support and counseling about physical activity/exercise needs.;Develop an individualized exercise prescription for aerobic and resistive training based on initial evaluation findings, risk stratification, comorbidities and participant's personal goals.       Expected Outcomes Short Term: Attend rehab on a regular basis to increase amount of physical activity.;Long Term: Exercising regularly at least 3-5 days a week.;Long Term: Add in home exercise to make exercise part of routine and to increase amount of physical activity.       Increase Strength and Stamina Yes       Intervention Provide advice, education, support and counseling about physical activity/exercise needs.;Develop an individualized exercise prescription for aerobic and resistive training based on initial evaluation findings, risk stratification, comorbidities and participant's personal goals.       Expected Outcomes Short Term: Increase workloads from initial exercise prescription for resistance, speed, and METs.;Short Term: Perform resistance training exercises routinely during rehab and add in resistance training at home;Long Term: Improve cardiorespiratory fitness, muscular endurance and strength as measured by increased METs and functional capacity ( )       Able to understand and use rate of perceived exertion (RPE) scale Yes       Intervention Provide education and explanation on how to use RPE scale  Expected Outcomes Short Term: Able to  use RPE daily in rehab to express subjective intensity level;Long Term:  Able to use RPE to guide intensity level when exercising independently       Able to understand and use Dyspnea scale Yes       Intervention Provide education and explanation on how to use Dyspnea scale       Expected Outcomes Short Term: Able to use Dyspnea scale daily in rehab to express subjective sense of shortness of breath during exertion;Long Term: Able to use Dyspnea scale to guide intensity level when exercising independently       Knowledge and understanding of Target Heart Rate Range (THRR) Yes       Intervention Provide education and explanation of THRR including how the numbers were predicted and where they are located for reference       Expected Outcomes Short Term: Able to state/look up THRR;Short Term: Able to use daily as guideline for intensity in rehab;Long Term: Able to use THRR to govern intensity when exercising independently       Understanding of Exercise Prescription Yes       Expected Outcomes Short Term: Able to explain program exercise prescription;Long Term: Able to explain home exercise prescription to exercise independently                Exercise Goals Re-Evaluation :   Discharge Exercise Prescription (Final Exercise Prescription Changes):   Nutrition:  Target Goals: Understanding of nutrition guidelines, daily intake of sodium 1500mg , cholesterol 200mg , calories 30% from fat and 7% or less from saturated fats, daily to have 5 or more servings of fruits and vegetables.  Biometrics: Grip Strength 23    Nutrition Therapy Plan and Nutrition Goals:   Nutrition Assessments:  MEDIFICTS Score Key: ?70 Need to make dietary changes  40-70 Heart Healthy Diet ? 40 Therapeutic Level Cholesterol Diet   Picture Your Plate Scores: <29 Unhealthy dietary pattern with much room for improvement. 41-50 Dietary pattern unlikely to meet recommendations for good health and room for  improvement. 51-60 More healthful dietary pattern, with some room for improvement.  >60 Healthy dietary pattern, although there may be some specific behaviors that could be improved.    Nutrition Goals Re-Evaluation:   Nutrition Goals Discharge (Final Nutrition Goals Re-Evaluation):   Psychosocial: Target Goals: Acknowledge presence or absence of significant depression and/or stress, maximize coping skills, provide positive support system. Participant is able to verbalize types and ability to use techniques and skills needed for reducing stress and depression.  Initial Review & Psychosocial Screening:  Initial Psych Review & Screening - 02/23/23 1128       Initial Review   Current issues with History of Depression      Family Dynamics   Good Support System? Yes    Comments Nephew looks out after her, kids leave far away      Barriers   Psychosocial barriers to participate in program There are no identifiable barriers or psychosocial needs.      Screening Interventions   Interventions Encouraged to exercise             Quality of Life Scores:  Scores of 19 and below usually indicate a poorer quality of life in these areas.  A difference of  2-3 points is a clinically meaningful difference.  A difference of 2-3 points in the total score of the Quality of Life Index has been associated with significant improvement in overall quality of life, self-image, physical  symptoms, and general health in studies assessing change in quality of life.  PHQ-9: Review Flowsheet  More data exists      02/23/2023 01/25/2023 01/04/2023 10/18/2022 09/30/2022  Depression screen PHQ 2/9  Decreased Interest 0 0 0 0 0  Down, Depressed, Hopeless 0 0 0 0 0  PHQ - 2 Score 0 0 0 0 0  Altered sleeping 0 - - - -  Tired, decreased energy 0 - - - -  Change in appetite 0 - - - -  Feeling bad or failure about yourself  0 - - - -  Trouble concentrating 0 - - - -  Moving slowly or fidgety/restless 0 - - -  -  Suicidal thoughts 0 - - - -  PHQ-9 Score 0 - - - -   Interpretation of Total Score  Total Score Depression Severity:  1-4 = Minimal depression, 5-9 = Mild depression, 10-14 = Moderate depression, 15-19 = Moderately severe depression, 20-27 = Severe depression   Psychosocial Evaluation and Intervention:  Psychosocial Evaluation - 02/23/23 1129       Psychosocial Evaluation & Interventions   Interventions Encouraged to exercise with the program and follow exercise prescription    Continue Psychosocial Services  No Follow up required             Psychosocial Re-Evaluation:   Psychosocial Discharge (Final Psychosocial Re-Evaluation):   Education: Education Goals: Education classes will be provided on a weekly basis, covering required topics. Participant will state understanding/return demonstration of topics presented.  Learning Barriers/Preferences:   Education Topics: Introduction to Pulmonary Rehab Group instruction provided by PowerPoint, verbal discussion, and written material to support subject matter. Instructor reviews what Pulmonary Rehab is, the purpose of the program, and how patients are referred.     Know Your Numbers Group instruction that is supported by a PowerPoint presentation. Instructor discusses importance of knowing and understanding resting, exercise, and post-exercise oxygen saturation, heart rate, and blood pressure. Oxygen saturation, heart rate, blood pressure, rating of perceived exertion, and dyspnea are reviewed along with a normal range for these values.    Exercise for the Pulmonary Patient Group instruction that is supported by a PowerPoint presentation. Instructor discusses benefits of exercise, core components of exercise, frequency, duration, and intensity of an exercise routine, importance of utilizing pulse oximetry during exercise, safety while exercising, and options of places to exercise outside of rehab.  Flowsheet Row PULMONARY  REHAB OTHER RESPIRATORY from 08/12/2016 in [facility] for Heart, Vascular, & Lung Health  Date 08/05/16  Educator EP  Instruction Review Code (Retired) 2- meets goals/outcomes       MET Level  Group instruction provided by PowerPoint, verbal discussion, and written material to support subject matter. Instructor reviews what METs are and how to increase METs.    Pulmonary Medications Verbally interactive group education provided by instructor with focus on inhaled medications and proper administration.   Anatomy and Physiology of the Respiratory System Group instruction provided by PowerPoint, verbal discussion, and written material to support subject matter. Instructor reviews respiratory cycle and anatomical components of the respiratory system and their functions. Instructor also reviews differences in obstructive and restrictive respiratory diseases with examples of each.    Oxygen Safety Group instruction provided by PowerPoint, verbal discussion, and written material to support subject matter. There is an overview of "What is Oxygen" and "Why do we need it".  Instructor also reviews how to create a safe environment for oxygen use, the importance of  using oxygen as prescribed, and the risks of noncompliance. There is a brief discussion on traveling with oxygen and resources the patient may utilize. Flowsheet Row PULMONARY REHAB OTHER RESPIRATORY from 08/12/2016 in [facility] for Heart, Vascular, & Lung Health  Date 07/08/16  Educator rn  Instruction Review Code (Retired) 2- meets goals/outcomes       Oxygen Use Group instruction provided by PowerPoint, verbal discussion, and written material to discuss how supplemental oxygen is prescribed and different types of oxygen supply systems. Resources for more information are provided.    Breathing Techniques Group instruction that is supported by demonstration and informational  handouts. Instructor discusses the benefits of pursed lip and diaphragmatic breathing and detailed demonstration on how to perform both.     Risk Factor Reduction Group instruction that is supported by a PowerPoint presentation. Instructor discusses the definition of a risk factor, different risk factors for pulmonary disease, and how the heart and lungs work together.   MD Day A group question and answer session with a medical doctor that allows participants to ask questions that relate to their pulmonary disease state.   Nutrition for the Pulmonary Patient Group instruction provided by PowerPoint slides, verbal discussion, and written materials to support subject matter. The instructor gives an explanation and review of healthy diet recommendations, which includes a discussion on weight management, recommendations for fruit and vegetable consumption, as well as protein, fluid, caffeine, fiber, sodium, sugar, and alcohol. Tips for eating when patients are short of breath are discussed.    Other Education Group or individual verbal, written, or video instructions that support the educational goals of the pulmonary rehab program.    Knowledge Questionnaire Score:  Knowledge Questionnaire Score - 02/23/23 1140       Knowledge Questionnaire Score   Pre Score 16/18             Core Components/Risk Factors/Patient Goals at Admission:  Personal Goals and Risk Factors at Admission - 02/23/23 1129       Core Components/Risk Factors/Patient Goals on Admission    Weight Management Weight Loss    Improve shortness of breath with ADL's Yes    Intervention Provide education, individualized exercise plan and daily activity instruction to help decrease symptoms of SOB with activities of daily living.    Expected Outcomes Short Term: Improve cardiorespiratory fitness to achieve a reduction of symptoms when performing ADLs;Long Term: Be able to perform more ADLs without symptoms or delay the  onset of symptoms             Core Components/Risk Factors/Patient Goals Review:    Core Components/Risk Factors/Patient Goals at Discharge (Final Review):    ITP Comments:   Comments: Dr. Mechele Collin is Medical Director for Pulmonary Rehab at [facility].

## 2023-02-23 NOTE — Progress Notes (Signed)
[name] 82 y.o. female  Pulmonary Rehab Orientation Note  This patient who was referred to Pulmonary Rehab by Dr. [name] with the diagnosis of COPD 2 arrived today in Cardiac and Pulmonary Rehab. She  arrived ambulatory with normal gait. She  does not carry portable oxygen. Color good, skin warm and dry. Patient is oriented to time and place. Patient's medical history, psychosocial health, and medications reviewed.   Psychosocial assessment reveals patient lives with alone. Crystalina is currently retired. Patient hobbies include gardening. Patient reports her stress level is low. Areas of stress/anxiety include health. Patient does not exhibit signs of depression, though she does have a history. Signs of depression includ PHQ2/9 score 0/0. [name] shows good  coping skills with positive outlook on life. Offered emotional support and reassurance. Will continue to monitor and evaluate progress toward psychosocial goal(s) of decreased stress.   Physical assessment reveals patient is alert and oriented x 4, but forgetful.  Heart rate is normal, breath sounds diminished to auscultation with expiratory wheezes. No rales or rhonchi. Reports chronic non-productive cough. Bowel sounds present x4 quads.  Pt denies abdominal discomfort, nausea, vomiting, diarrhea or constipation. Grip strength equal, strong. Distal pulses +2; no swelling to lower extremities. [name] reports she does take medications as prescribed. Patient states she follows a regular  diet. The patient has been trying to lose weight through a healthy diet and exercise program. Pt's weight will be monitored closely.   Demonstration and practice of PLB using pulse oximeter. [name] able to return demonstration satisfactorily. Safety and hand hygiene in the exercise area reviewed with patient. [name] voices understanding of the information reviewed. Department expectations discussed with patient and achievable goals were set. The patient shows  enthusiasm about attending the program and we look forward to working with [facility]. [name] completed a 6 min walk test today and is scheduled to begin exercise on 6/27 at 1:30pm.   [id number] Essie Hart, RN, BSN

## 2023-02-26 ENCOUNTER — Other Ambulatory Visit: Payer: Self-pay | Admitting: Nurse Practitioner

## 2023-03-01 ENCOUNTER — Ambulatory Visit: Payer: Self-pay

## 2023-03-01 NOTE — Patient Instructions (Signed)
Visit Information  Thank you for taking time to visit with me today. Please don't hesitate to contact me if I can be of assistance to you.   Following are the goals we discussed today:   Goals Addressed               This Visit's Progress     Patient Stated     I would like to work on lower my A1c (pt-stated)        Care Coordination Interventions: Provided education to patient about basic DM disease process Reviewed medications with patient and discussed importance of medication adherence Determined patient has decided not to take Rybelsus at this time due to fear of side effects Counseled on Diabetic diet, my plate method, 161 minutes of moderate intensity exercise/week Lab Results  Component Value Date   HGBA1C 7.0 (H) 01/04/2023          Other     To start pulmonary rehab as directed for decreased lung function        Care Coordination Interventions: Advised patient to engage in light exercise as tolerated 3-5 days a week to aid in the the management of COPD Discussed the importance of adequate rest and management of fatigue with COPD Discussed Pulmonary Rehab and offered to assist with referral placement Reviewed scheduled/upcoming provider appointments including: upcoming scheduled pulmonary rehab visits Educated patient about the [facility] Care Guide for transportation resources, discussed patient will contact this RN if help is needed            Our next appointment is by telephone on 03/31/23 at 10:30 AM  Please call the care guide team at [phone] if you need to cancel or reschedule your appointment.   If you are experiencing a Mental Health or Behavioral Health Crisis or need someone to talk to, please call 1-800-273-TALK (toll free, 24 hour hotline)  The patient verbalized understanding of instructions, educational materials, and care plan provided today and DECLINED offer to receive copy of patient instructions, educational materials, and care plan.    Delsa Sale, RN, BSN, CCM Care Management Coordinator [facility] Care Management Direct Phone: [phone]

## 2023-03-01 NOTE — Patient Outreach (Signed)
  Care Coordination   Follow Up Visit Note   03/01/2023 Name: [name] MRN: [id number] DOB: 10/11/1940  [name] is a 82 y.o. year old female who sees Arnette Felts, FNP for primary care. I spoke with  [name] by phone today.  What matters to the patients health and wellness today?  Patient would like to start Pulmonary Rehab as directed to improve her COPD.     Goals Addressed               This Visit's Progress     Patient Stated     I would like to work on lower my A1c (pt-stated)        Care Coordination Interventions: Provided education to patient about basic DM disease process Reviewed medications with patient and discussed importance of medication adherence Determined patient has decided not to take Rybelsus at this time due to fear of side effects Counseled on Diabetic diet, my plate method, 272 minutes of moderate intensity exercise/week Lab Results  Component Value Date   HGBA1C 7.0 (H) 01/04/2023        Other     To start pulmonary rehab as directed for decreased lung function        Care Coordination Interventions: Advised patient to engage in light exercise as tolerated 3-5 days a week to aid in the the management of COPD Discussed the importance of adequate rest and management of fatigue with COPD Discussed Pulmonary Rehab and offered to assist with referral placement Reviewed scheduled/upcoming provider appointments including: upcoming scheduled pulmonary rehab visits Educated patient about the [facility] Care Guide for transportation resources, discussed patient will contact this RN if help is needed      Interventions Today    Flowsheet Row Most Recent Value  Chronic Disease   Chronic disease during today's visit Diabetes, Chronic Obstructive Pulmonary Disease (COPD)  General Interventions   General Interventions Discussed/Reviewed General Interventions Discussed, General Interventions Reviewed, Doctor Visits  Doctor Visits  Discussed/Reviewed Doctor Visits Discussed, Doctor Visits Reviewed, PCP, Specialist  Education Interventions   Education Provided Provided Education  Provided Verbal Education On When to see the doctor, Exercise, Medication  Pharmacy Interventions   Pharmacy Dicussed/Reviewed Pharmacy Topics Discussed, Pharmacy Topics Reviewed, Medications and their functions          SDOH assessments and interventions completed:  No     Care Coordination Interventions:  Yes, provided   Follow up plan: Follow up call scheduled for 03/31/23 @10 :30 AM    Encounter Outcome:  Pt. Visit Completed

## 2023-03-02 ENCOUNTER — Telehealth (HOSPITAL_COMMUNITY): Payer: Self-pay

## 2023-03-02 NOTE — Telephone Encounter (Signed)
Returned call to patient. OK for patient to start Pulm Rehab on 7/2. Will see patient then.

## 2023-03-03 ENCOUNTER — Encounter (HOSPITAL_COMMUNITY): Payer: Medicare HMO

## 2023-03-08 ENCOUNTER — Encounter (HOSPITAL_COMMUNITY)
Admission: RE | Admit: 2023-03-08 | Discharge: 2023-03-08 | Disposition: A | Discharge status: Home / Self Care | Payer: Medicare HMO | Source: Ambulatory Visit | Attending: Pulmonary Disease | Admitting: Pulmonary Disease

## 2023-03-08 DIAGNOSIS — J449 Chronic obstructive pulmonary disease, unspecified: Secondary | ICD-10-CM | POA: Insufficient documentation

## 2023-03-08 LAB — GLUCOSE, CAPILLARY
Glucose-Capillary: 184 mg/dL — ABNORMAL HIGH (ref 70–99)
Glucose-Capillary: 160 mg/dL — ABNORMAL HIGH (ref 70–99)

## 2023-03-08 NOTE — Progress Notes (Signed)
Daily Session Note  Patient Details  Name: BRIELLAH GORLEY MRN: [id number] Date of Birth: March 18, 1941 Referring Provider:   [name] Pulmonary Rehab Walk Test from 02/23/2023 in [facility]  Referring Provider [name]       Encounter Date: 03/08/2023  Check In:  Session Check In - 03/08/23 1448       Check-In   Supervising physician immediately available to respond to emergencies [facility] MD immediately available    Physician(s) Robin Searing, NP    Location [facility]    Staff Present Essie Hart, RN, Doris Cheadle, MS, ACSM-CEP, Exercise Physiologist;Olinty Peggye Pitt, MS, ACSM-CEP, Exercise Physiologist;Randi Idelle Crouch BS, ACSM-CEP, Exercise Physiologist;Samantha Belarus, RD, LDN    Virtual Visit No    Medication changes reported     No    Fall or balance concerns reported    No    Tobacco Cessation No Change    Warm-up and Cool-down Performed as group-led instruction    Resistance Training Performed Yes    VAD Patient? No    PAD/SET Patient? No      Pain Assessment   Currently in Pain? No/denies    Multiple Pain Sites No             Capillary Blood Glucose: Results for orders placed or performed during the hospital encounter of 03/08/23 (from the past 24 hour(s))  Glucose, capillary     Status: Abnormal   Collection Time: 03/08/23  1:36 PM  Result Value Ref Range   Glucose-Capillary 184 (H) 70 - 99 mg/dL  Glucose, capillary     Status: Abnormal   Collection Time: 03/08/23  2:58 PM  Result Value Ref Range   Glucose-Capillary 160 (H) 70 - 99 mg/dL      Social History   Tobacco Use  Smoking Status Former   Packs/day: 1.00   Years: 56.00   Additional pack years: 0.00   Total pack years: 56.00   Types: Cigarettes   Quit date: 06/22/2015   Years since quitting: 7.7  Smokeless Tobacco Never    Goals Met:  Exercise tolerated well No report of concerns or symptoms today Strength training  completed today  Goals Unmet:  Not Applicable  Comments: Service time is from 1330 to 1500    Dr. Mechele Collin is Medical Director for Pulmonary Rehab at [facility].

## 2023-03-15 ENCOUNTER — Encounter (HOSPITAL_COMMUNITY)
Admission: RE | Admit: 2023-03-15 | Discharge: 2023-03-15 | Disposition: A | Discharge status: Home / Self Care | Payer: Medicare HMO | Source: Ambulatory Visit | Attending: Pulmonary Disease | Admitting: Pulmonary Disease

## 2023-03-15 VITALS — Wt 162.3 lb

## 2023-03-15 DIAGNOSIS — J449 Chronic obstructive pulmonary disease, unspecified: Secondary | ICD-10-CM

## 2023-03-15 LAB — GLUCOSE, CAPILLARY
Glucose-Capillary: 160 mg/dL — ABNORMAL HIGH (ref 70–99)
Glucose-Capillary: 144 mg/dL — ABNORMAL HIGH (ref 70–99)

## 2023-03-15 NOTE — Progress Notes (Signed)
Daily Session Note  Patient Details  Name: [name] MRN: [id number] Date of Birth: February 08, 1941 Referring Provider:   [name] Pulmonary Rehab Walk Test from 02/23/2023 in [facility]  Referring Provider [name]       Encounter Date: 03/15/2023  Check In:  Session Check In - 03/15/23 1423       Check-In   Supervising physician immediately available to respond to emergencies [facility] MD immediately available    Physician(s) [name], NP    Location [facility]    Staff Present Velora Mediate, RN, MSN;Samantha Belarus, RD, Dutch Gray, RN, BSN;Randi Reeve BS, ACSM-CEP, Exercise Physiologist;Kaylee Earlene Plater, MS, ACSM-CEP, Exercise Physiologist;Marianita Botkin Katrinka Blazing, RT    Virtual Visit No    Medication changes reported     No    Fall or balance concerns reported    No    Tobacco Cessation No Change    Warm-up and Cool-down Performed as group-led instruction    Resistance Training Performed Yes    VAD Patient? No    PAD/SET Patient? No      Pain Assessment   Currently in Pain? No/denies    Multiple Pain Sites No             Capillary Blood Glucose: Results for orders placed or performed during the hospital encounter of 03/08/23 (from the past 24 hour(s))  Glucose, capillary     Status: Abnormal   Collection Time: 03/15/23  1:35 PM  Result Value Ref Range   Glucose-Capillary 160 (H) 70 - 99 mg/dL  Glucose, capillary     Status: Abnormal   Collection Time: 03/15/23  2:45 PM  Result Value Ref Range   Glucose-Capillary 144 (H) 70 - 99 mg/dL     Exercise Prescription Changes - 03/15/23 1500       Response to Exercise   Blood Pressure (Admit) 138/70    Blood Pressure (Exercise) 142/72    Blood Pressure (Exit) 132/54    Heart Rate (Admit) 113 bpm    Heart Rate (Exercise) 116 bpm    Heart Rate (Exit) 109 bpm    Oxygen Saturation (Admit) 91 %    Oxygen Saturation (Exercise) 92 %    Oxygen  Saturation (Exit) 93 %    Rating of Perceived Exertion (Exercise) 11    Perceived Dyspnea (Exercise) 1    Duration Continue with 30 min of aerobic exercise without signs/symptoms of physical distress.    Intensity THRR unchanged      Progression   Progression Continue to progress workloads to maintain intensity without signs/symptoms of physical distress.      Resistance Training   Training Prescription Yes    Weight red bands    Reps 10-15    Time 10 Minutes      NuStep   Level 1    Minutes 15    METs 1.8      Track   Laps 10    Minutes 15    METs 2.54             Social History   Tobacco Use  Smoking Status Former   Packs/day: 1.00   Years: 56.00   Additional pack years: 0.00   Total pack years: 56.00   Types: Cigarettes   Quit date: 06/22/2015   Years since quitting: 7.7  Smokeless Tobacco Never    Goals Met:  Proper associated with RPD/PD & O2 Sat Independence with exercise equipment  Exercise tolerated well No report of concerns or symptoms today Strength training completed today  Goals Unmet:  Not Applicable  Comments: Service time is from 1328 to 1450.    Dr. Mechele Collin is Medical Director for Pulmonary Rehab at [facility].

## 2023-03-16 NOTE — Progress Notes (Signed)
Pulmonary Individual Treatment Plan  Patient Details  Name: [name] MRN: [id number] Date of Birth: 15-Feb-1941 Referring Provider:   [name] Pulmonary Rehab Walk Test from 02/23/2023 in [facility]  Referring Provider [name]       Initial Encounter Date:  Flowsheet Row Pulmonary Rehab Walk Test from 02/23/2023 in [facility]  Date 02/23/23       Visit Diagnosis: Stage 2 moderate COPD by GOLD classification (HCC)  Patient's Home Medications on Admission:   Current Outpatient Medications:    albuterol (PROVENTIL) (2.5 MG/3ML) 0.083% nebulizer solution, Take 3 mLs (2.5 mg total) by nebulization every 6 (six) hours as needed for wheezing or shortness of breath., Disp: 75 mL, Rfl: 12   albuterol (VENTOLIN HFA) 108 (90 Base) MCG/ACT inhaler, INHALE 2 PUFFS BY MOUTH EVERY 6 HOURS AS NEEDED FOR WHEEZE OR SHORTNESS OF BREATH, Disp: 18 each, Rfl: 2   aspirin 81 MG tablet, Take 81 mg by mouth every evening. , Disp: , Rfl:    BIOTIN 5000 PO, Take 1 capsule by mouth daily at 2 am. Takes around 2PM, Disp: , Rfl:    Blood Glucose Monitoring Suppl (TRUE METRIX AIR GLUCOSE METER) w/Device KIT, Use to check blood sugar 4 times a day. Dx code e11.65, Disp: 1 kit, Rfl: 2   Calcium Carbonate-Vitamin D 600-400 MG-UNIT tablet, Take 1 tablet by mouth daily. (Patient taking differently: Take 1 tablet by mouth daily. 600 mg of calcium and 500 units of vitd), Disp:  , Rfl:    cycloSPORINE (RESTASIS) 0.05 % ophthalmic emulsion, 1 drop 2 (two) times daily., Disp: , Rfl:    donepezil (ARICEPT) 10 MG tablet, TAKE 1 TABLET BY MOUTH EVERY DAY IN THE EVENING, Disp: 90 tablet, Rfl: 1   ezetimibe (ZETIA) 10 MG tablet, TAKE 1 TABLET BY MOUTH EVERYDAY AT BEDTIME, Disp: 90 tablet, Rfl: 1   flunisolide (NASALIDE) 25 MCG/ACT (0.025%) SOLN, PLACE 2 SPRAYS INTO THE NOSE 2 (TWO) TIMES DAILY. (Patient  not taking: Reported on 02/23/2023), Disp: 25 mL, Rfl: 1   glucose blood (TRUE METRIX BLOOD GLUCOSE TEST) test strip, USE AS INSTRUCTED TO CHECK BLOOD SUGAR TWICE DAILY, Disp: 200 strip, Rfl: 2   JARDIANCE 10 MG TABS tablet, TAKE 1 TABLET BY MOUTH EVERY DAY BEFORE BREAKFAST, Disp: 30 tablet, Rfl: 2   KLOR-CON M20 20 MEQ tablet, TAKE 1 TABLET BY MOUTH EVERY DAY WITH FOOD, Disp: 90 tablet, Rfl: 1   lubiprostone (AMITIZA) 8 MCG capsule, Take 1 capsule (8 mcg total) by mouth daily with breakfast., Disp: 90 capsule, Rfl: 1   magnesium oxide (MAG-OX) 400 MG tablet, Take 400 mg by mouth daily., Disp: , Rfl:    Melatonin 2.5 MG CAPS, Take 2.5 mg by mouth at bedtime as needed (sleep). , Disp: , Rfl:    mirtazapine (REMERON) 15 MG tablet, TAKE 1 TABLET BY MOUTH EVERYDAY AT BEDTIME, Disp: 90 tablet, Rfl: 1   Multiple Vitamin (MULTIVITAMIN) tablet, Take 1 tablet by mouth daily., Disp: , Rfl:    niacin (VITAMIN B3) 500 MG ER tablet, TAKE 1 TABLET BY MOUTH EVERY DAY IN THE EVENING, Disp: 90 tablet, Rfl: 3   OVER THE COUNTER MEDICATION, Take 1 tablet by mouth daily., Disp: , Rfl:    pantoprazole (PROTONIX) 40 MG tablet, TAKE 1 TABLET BY MOUTH TWICE A DAY, Disp: 180 tablet, Rfl: 1   Probiotic Product (PROBIOTIC PO), Take  by mouth., Disp: , Rfl:    rosuvastatin (CRESTOR) 10 MG tablet, Take 1 tablet (10 mg total) by mouth daily., Disp: 90 tablet, Rfl: 1   Semaglutide (RYBELSUS) 7 MG TABS, Take 1 tablet (7 mg total) by mouth daily. (Patient not taking: Reported on 02/23/2023), Disp: 90 tablet, Rfl: 1   TRELEGY ELLIPTA 100-62.5-25 MCG/ACT AEPB, INHALE 1 PUFF ONE TIME DAILY, Disp: 180 each, Rfl: 3   TRUEplus Lancets 33G MISC, TEST BLOOD SUGAR AS DIRECTED, Disp: 400 each, Rfl: 3   valACYclovir (VALTREX) 500 MG tablet, TAKE 1 TABLET (500 MG TOTAL) BY MOUTH DAILY., Disp: 90 tablet, Rfl: 0   White Petrolatum-Mineral Oil (SYSTANE NIGHTTIME OP), Place 1 drop into both eyes at bedtime as needed (for dry eyes). , Disp: , Rfl:    Past Medical History: Past Medical History:  Diagnosis Date   Arthritis    Asthma    COPD (chronic obstructive pulmonary disease) (HCC)    Depression    Diabetes mellitus without complication (HCC)    Diverticulitis    GERD (gastroesophageal reflux disease)    Hyperlipidemia    Hypertension 06/15/2016   left lung ca dx'd 2016   Thyroid nodule     Tobacco Use: Social History   Tobacco Use  Smoking Status Former   Packs/day: 1.00   Years: 56.00   Additional pack years: 0.00   Total pack years: 56.00   Types: Cigarettes   Quit date: 06/22/2015   Years since quitting: 7.7  Smokeless Tobacco Never    Labs: Review Flowsheet  More data exists      Latest Ref Rng & Units 08/24/2021 12/30/2021 05/03/2022 10/18/2022 01/04/2023  Labs for ITP Cardiac and Pulmonary Rehab  Cholestrol 100 - 199 mg/dL 295  621  - 308  657   LDL (calc) 0 - 99 mg/dL 846  962  - 952  44   HDL-C >39 mg/dL 47  45  - 48  50   Trlycerides 0 - 149 mg/dL 841  324  - 401  92   Hemoglobin A1c 4.8 - 5.6 % 6.6  6.9  6.9  6.9  7.0     Capillary Blood Glucose: Lab Results  Component Value Date   GLUCAP 144 (H) 03/15/2023   GLUCAP 160 (H) 03/15/2023   GLUCAP 160 (H) 03/08/2023   GLUCAP 184 (H) 03/08/2023   GLUCAP 104 (H) 02/23/2023    POCT Glucose     Row Name 02/23/23 1120 02/23/23 1230           POCT Blood Glucose   Pre-Exercise 125 mg/dL --      Post-Exercise -- 103 mg/dL               Pulmonary Assessment Scores:  Pulmonary Assessment Scores     Row Name 02/23/23 1142         ADL UCSD   SOB Score total 28       CAT Score   CAT Score 18       mMRC Score   mMRC Score 3             UCSD: Self-administered rating of dyspnea associated with activities of daily living (ADLs) 6-point scale (0 = "not at all" to 5 = "maximal or unable to do because of breathlessness")  Scoring Scores range from 0 to 120.  Minimally important difference is 5 units  CAT: CAT can identify the  health impairment of COPD patients and is better correlated with disease  progression.  CAT has a scoring range of zero to 40. The CAT score is classified into four groups of low (less than 10), medium (10 - 20), high (21-30) and very high (31-40) based on the impact level of disease on health status. A CAT score over 10 suggests significant symptoms.  A worsening CAT score could be explained by an exacerbation, poor medication adherence, poor inhaler technique, or progression of COPD or comorbid conditions.  CAT MCID is 2 points  mMRC: mMRC (Modified Medical Research Council) Dyspnea Scale is used to assess the degree of baseline functional disability in patients of respiratory disease due to dyspnea. No minimal important difference is established. A decrease in score of 1 point or greater is considered a positive change.   Pulmonary Function Assessment:  Pulmonary Function Assessment - 02/23/23 1142       Breath   Bilateral Breath Sounds Wheezes    Shortness of Breath Yes;Limiting activity             Exercise Target Goals: Exercise Program Goal: Individual exercise prescription set using results from initial 6 min walk test and THRR while considering  patient's activity barriers and safety.   Exercise Prescription Goal: Initial exercise prescription builds to 30-45 minutes a day of aerobic activity, 2-3 days per week.  Home exercise guidelines will be given to patient during program as part of exercise prescription that the participant will acknowledge.  Activity Barriers & Risk Stratification:  Activity Barriers & Cardiac Risk Stratification - 02/23/23 1124       Activity Barriers & Cardiac Risk Stratification   Activity Barriers Back Problems;Other (comment);Deconditioning;Muscular Weakness;Shortness of Breath    Comments left shoulder pain             6 Minute Walk:  6 Minute Walk     Row Name 02/23/23 1411         6 Minute Walk   Phase Initial     Distance  885 feet     Walk Time 6 minutes     # of Rest Breaks 0     MPH 1.68     METS 1.6     RPE 10     Perceived Dyspnea  1     VO2 Peak 5.6     Resting HR 76 bpm     Resting BP 130/62     Resting Oxygen Saturation  97 %     Exercise Oxygen Saturation  during 6 min walk 91 %     Max Ex. HR 103 bpm     Max Ex. BP 146/68     2 Minute Post BP 124/60       Interval HR   1 Minute HR 80     2 Minute HR 93     3 Minute HR 96     4 Minute HR 98     5 Minute HR 100     6 Minute HR 103     2 Minute Post HR 81     Interval Heart Rate? Yes       Interval Oxygen   Interval Oxygen? Yes     Baseline Oxygen Saturation % 97 %     1 Minute Oxygen Saturation % 96 %     1 Minute Liters of Oxygen 0 L     2 Minute Oxygen Saturation % 92 %     2 Minute Liters of Oxygen 0 L     3 Minute Oxygen Saturation %  92 %     3 Minute Liters of Oxygen 0 L     4 Minute Oxygen Saturation % 91 %     4 Minute Liters of Oxygen 0 L     5 Minute Oxygen Saturation % 92 %     5 Minute Liters of Oxygen 0 L     6 Minute Oxygen Saturation % 92 %     6 Minute Liters of Oxygen 0 L     2 Minute Post Oxygen Saturation % 95 %     2 Minute Post Liters of Oxygen 0 L              Oxygen Initial Assessment:  Oxygen Initial Assessment - 02/23/23 1535       Home Oxygen   Home Oxygen Device None    Sleep Oxygen Prescription None    Home Exercise Oxygen Prescription None    Home Resting Oxygen Prescription None      Initial 6 min Walk   Oxygen Used None      Program Oxygen Prescription   Program Oxygen Prescription None      Intervention   Short Term Goals To learn and exhibit compliance with exercise, home and travel O2 prescription;To learn and understand importance of maintaining oxygen saturations>88%;To learn and understand importance of monitoring SPO2 with pulse oximeter and demonstrate accurate use of the pulse oximeter.;To learn and demonstrate proper pursed lip breathing techniques or other breathing  techniques. ;To learn and demonstrate proper use of respiratory medications    Long  Term Goals Maintenance of O2 saturations>88%;Verbalizes importance of monitoring SPO2 with pulse oximeter and return demonstration;Exhibits proper breathing techniques, such as pursed lip breathing or other method taught during program session;Demonstrates proper use of MDI's;Exhibits compliance with exercise, home  and travel O2 prescription;Compliance with respiratory medication             Oxygen Re-Evaluation:  Oxygen Re-Evaluation     Row Name 03/09/23 1533             Program Oxygen Prescription   Program Oxygen Prescription None         Home Oxygen   Home Oxygen Device None       Sleep Oxygen Prescription None       Home Exercise Oxygen Prescription None       Home Resting Oxygen Prescription None       Compliance with Home Oxygen Use Yes         Goals/Expected Outcomes   Short Term Goals To learn and exhibit compliance with exercise, home and travel O2 prescription;To learn and understand importance of maintaining oxygen saturations>88%;To learn and demonstrate proper use of respiratory medications;To learn and understand importance of monitoring SPO2 with pulse oximeter and demonstrate accurate use of the pulse oximeter.;To learn and demonstrate proper pursed lip breathing techniques or other breathing techniques.        Long  Term Goals Exhibits compliance with exercise, home  and travel O2 prescription;Verbalizes importance of monitoring SPO2 with pulse oximeter and return demonstration;Maintenance of O2 saturations>88%;Exhibits proper breathing techniques, such as pursed lip breathing or other method taught during program session;Compliance with respiratory medication;Demonstrates proper use of MDI's       Comments no O2 needs       Goals/Expected Outcomes Compliance and understanding of oxygen saturations monitoring and breathing techniques to decrease shortness of breath.                 Oxygen Discharge (  Final Oxygen Re-Evaluation):  Oxygen Re-Evaluation - 03/09/23 1533       Program Oxygen Prescription   Program Oxygen Prescription None      Home Oxygen   Home Oxygen Device None    Sleep Oxygen Prescription None    Home Exercise Oxygen Prescription None    Home Resting Oxygen Prescription None    Compliance with Home Oxygen Use Yes      Goals/Expected Outcomes   Short Term Goals To learn and exhibit compliance with exercise, home and travel O2 prescription;To learn and understand importance of maintaining oxygen saturations>88%;To learn and demonstrate proper use of respiratory medications;To learn and understand importance of monitoring SPO2 with pulse oximeter and demonstrate accurate use of the pulse oximeter.;To learn and demonstrate proper pursed lip breathing techniques or other breathing techniques.     Long  Term Goals Exhibits compliance with exercise, home  and travel O2 prescription;Verbalizes importance of monitoring SPO2 with pulse oximeter and return demonstration;Maintenance of O2 saturations>88%;Exhibits proper breathing techniques, such as pursed lip breathing or other method taught during program session;Compliance with respiratory medication;Demonstrates proper use of MDI's    Comments no O2 needs    Goals/Expected Outcomes Compliance and understanding of oxygen saturations monitoring and breathing techniques to decrease shortness of breath.             Initial Exercise Prescription:  Initial Exercise Prescription - 02/23/23 1200       Date of Initial Exercise RX and Referring Provider   Date 02/23/23    Referring Provider [name]    Expected Discharge Date 05/19/23      NuStep   Level 1    SPM 60    Minutes 15    METs 2.5      Track   Minutes 15    METs 2.5      Prescription Details   Frequency (times per week) 2    Duration Progress to 30 minutes of continuous aerobic without signs/symptoms of physical distress       Intensity   THRR 40-80% of Max Heartrate 56-111    Ratings of Perceived Exertion 11-13    Perceived Dyspnea 0-4      Progression   Progression Continue progressive overload as per policy without signs/symptoms or physical distress.      Resistance Training   Training Prescription Yes    Weight red bands    Reps 10-15             Perform Capillary Blood Glucose checks as needed.  Exercise Prescription Changes:   Exercise Prescription Changes     Row Name 03/15/23 1500             Response to Exercise   Blood Pressure (Admit) 138/70       Blood Pressure (Exercise) 142/72       Blood Pressure (Exit) 132/54       Heart Rate (Admit) 113 bpm       Heart Rate (Exercise) 116 bpm       Heart Rate (Exit) 109 bpm       Oxygen Saturation (Admit) 91 %       Oxygen Saturation (Exercise) 92 %       Oxygen Saturation (Exit) 93 %       Rating of Perceived Exertion (Exercise) 11       Perceived Dyspnea (Exercise) 1       Duration Continue with 30 min of aerobic exercise without signs/symptoms of physical distress.  Intensity THRR unchanged         Progression   Progression Continue to progress workloads to maintain intensity without signs/symptoms of physical distress.         Resistance Training   Training Prescription Yes       Weight red bands       Reps 10-15       Time 10 Minutes         NuStep   Level 1       Minutes 15       METs 1.8         Track   Laps 10       Minutes 15       METs 2.54                Exercise Comments:   Exercise Comments     Row Name 03/08/23 1606           Exercise Comments Pt completed first day of group exercise. She walked track 15 min with walker, METs 2.23. Pt then exercised on the recumbent stepper for 15 min, level 1, METs 1.7. Tolerated fair. Performed warm up and cool down with verbal and demonstrative cues. discussed METs                Exercise Goals and Review:   Exercise Goals     Row Name  02/23/23 1125 03/09/23 1530           Exercise Goals   Increase Physical Activity Yes Yes      Intervention Provide advice, education, support and counseling about physical activity/exercise needs.;Develop an individualized exercise prescription for aerobic and resistive training based on initial evaluation findings, risk stratification, comorbidities and participant's personal goals. Provide advice, education, support and counseling about physical activity/exercise needs.;Develop an individualized exercise prescription for aerobic and resistive training based on initial evaluation findings, risk stratification, comorbidities and participant's personal goals.      Expected Outcomes Short Term: Attend rehab on a regular basis to increase amount of physical activity.;Long Term: Exercising regularly at least 3-5 days a week.;Long Term: Add in home exercise to make exercise part of routine and to increase amount of physical activity. Short Term: Attend rehab on a regular basis to increase amount of physical activity.;Long Term: Exercising regularly at least 3-5 days a week.;Long Term: Add in home exercise to make exercise part of routine and to increase amount of physical activity.      Increase Strength and Stamina Yes Yes      Intervention Provide advice, education, support and counseling about physical activity/exercise needs.;Develop an individualized exercise prescription for aerobic and resistive training based on initial evaluation findings, risk stratification, comorbidities and participant's personal goals. Provide advice, education, support and counseling about physical activity/exercise needs.;Develop an individualized exercise prescription for aerobic and resistive training based on initial evaluation findings, risk stratification, comorbidities and participant's personal goals.      Expected Outcomes Short Term: Increase workloads from initial exercise prescription for resistance, speed, and  METs.;Short Term: Perform resistance training exercises routinely during rehab and add in resistance training at home;Long Term: Improve cardiorespiratory fitness, muscular endurance and strength as measured by increased METs and functional capacity ( ) Short Term: Increase workloads from initial exercise prescription for resistance, speed, and METs.;Short Term: Perform resistance training exercises routinely during rehab and add in resistance training at home;Long Term: Improve cardiorespiratory fitness, muscular endurance and strength as measured by increased METs and functional capacity ( )  Able to understand and use rate of perceived exertion (RPE) scale Yes Yes      Intervention Provide education and explanation on how to use RPE scale Provide education and explanation on how to use RPE scale      Expected Outcomes Short Term: Able to use RPE daily in rehab to express subjective intensity level;Long Term:  Able to use RPE to guide intensity level when exercising independently Short Term: Able to use RPE daily in rehab to express subjective intensity level;Long Term:  Able to use RPE to guide intensity level when exercising independently      Able to understand and use Dyspnea scale Yes Yes      Intervention Provide education and explanation on how to use Dyspnea scale Provide education and explanation on how to use Dyspnea scale      Expected Outcomes Short Term: Able to use Dyspnea scale daily in rehab to express subjective sense of shortness of breath during exertion;Long Term: Able to use Dyspnea scale to guide intensity level when exercising independently Short Term: Able to use Dyspnea scale daily in rehab to express subjective sense of shortness of breath during exertion;Long Term: Able to use Dyspnea scale to guide intensity level when exercising independently      Knowledge and understanding of Target Heart Rate Range (THRR) Yes Yes      Intervention Provide education and explanation  of THRR including how the numbers were predicted and where they are located for reference Provide education and explanation of THRR including how the numbers were predicted and where they are located for reference      Expected Outcomes Short Term: Able to state/look up THRR;Short Term: Able to use daily as guideline for intensity in rehab;Long Term: Able to use THRR to govern intensity when exercising independently Short Term: Able to state/look up THRR;Short Term: Able to use daily as guideline for intensity in rehab;Long Term: Able to use THRR to govern intensity when exercising independently      Understanding of Exercise Prescription Yes Yes      Expected Outcomes Short Term: Able to explain program exercise prescription;Long Term: Able to explain home exercise prescription to exercise independently Short Term: Able to explain program exercise prescription;Long Term: Able to explain home exercise prescription to exercise independently               Exercise Goals Re-Evaluation :  Exercise Goals Re-Evaluation     Row Name 03/09/23 1530             Exercise Goal Re-Evaluation   Exercise Goals Review Increase Physical Activity;Able to understand and use Dyspnea scale;Understanding of Exercise Prescription;Increase Strength and Stamina;Knowledge and understanding of Target Heart Rate Range (THRR);Able to understand and use rate of perceived exertion (RPE) scale       Comments Pt has completed one day of group exercise. She walked track 15 min with walker, METs 2.23. Pt then exercised on the recumbent stepper for 15 min, level 1, METs 1.7. Tolerated fair. Performed warm up and cool down with verbal and demonstrative cues. Will progress as tolerated.       Expected Outcomes Through exercise at rehab and at home, the patient will decrease shortness of breath with daily activities and feel confident in carrying out an exercise regime at home.                Discharge Exercise Prescription  (Final Exercise Prescription Changes):  Exercise Prescription Changes - 03/15/23 1500  Response to Exercise   Blood Pressure (Admit) 138/70    Blood Pressure (Exercise) 142/72    Blood Pressure (Exit) 132/54    Heart Rate (Admit) 113 bpm    Heart Rate (Exercise) 116 bpm    Heart Rate (Exit) 109 bpm    Oxygen Saturation (Admit) 91 %    Oxygen Saturation (Exercise) 92 %    Oxygen Saturation (Exit) 93 %    Rating of Perceived Exertion (Exercise) 11    Perceived Dyspnea (Exercise) 1    Duration Continue with 30 min of aerobic exercise without signs/symptoms of physical distress.    Intensity THRR unchanged      Progression   Progression Continue to progress workloads to maintain intensity without signs/symptoms of physical distress.      Resistance Training   Training Prescription Yes    Weight red bands    Reps 10-15    Time 10 Minutes      NuStep   Level 1    Minutes 15    METs 1.8      Track   Laps 10    Minutes 15    METs 2.54             Nutrition:  Target Goals: Understanding of nutrition guidelines, daily intake of sodium 1500mg , cholesterol 200mg , calories 30% from fat and 7% or less from saturated fats, daily to have 5 or more servings of fruits and vegetables.  Biometrics:  Pre Biometrics - 02/23/23 1423       Pre Biometrics   Grip Strength 23 kg              Nutrition Therapy Plan and Nutrition Goals:  Nutrition Therapy & Goals - 03/08/23 1601       Nutrition Therapy   Diet Heart Healthy Diet    Drug/Food Interactions Statins/Certain Fruits      Personal Nutrition Goals   Nutrition Goal Patient to improve diet quality by using the plate method as a guide for meal planning to include lean protein/plant protein, fruits, vegetables, whole grains, nonfat dairy as part of a well-balanced diet.    Comments [name] is taking rybelsus for blood sugar support; she does not check her blood sugars. Her nephew is a good support. Patient will  benefit from participation in pulmonary rehab rehab for nutrition, exercise, and lifestyle modification.      Intervention Plan   Intervention Prescribe, educate and counsel regarding individualized specific dietary modifications aiming towards targeted core components such as weight, hypertension, lipid management, diabetes, heart failure and other comorbidities.;Nutrition handout(s) given to patient.    Expected Outcomes Short Term Goal: Understand basic principles of dietary content, such as calories, fat, sodium, cholesterol and nutrients.;Long Term Goal: Adherence to prescribed nutrition plan.             Nutrition Assessments:  Nutrition Assessments - 03/15/23 1553       Rate Your Plate Scores   Pre Score 67            MEDIFICTS Score Key: ?70 Need to make dietary changes  40-70 Heart Healthy Diet ? 40 Therapeutic Level Cholesterol Diet  Flowsheet Row PULMONARY REHAB CHRONIC OBSTRUCTIVE PULMONARY DISEASE from 03/15/2023 in [facility]  Picture Your Plate Total Score on Admission 67      Picture Your Plate Scores: <82 Unhealthy dietary pattern with much room for improvement. 41-50 Dietary pattern unlikely to meet recommendations for good health and  room for improvement. 51-60 More healthful dietary pattern, with some room for improvement.  >60 Healthy dietary pattern, although there may be some specific behaviors that could be improved.    Nutrition Goals Re-Evaluation:  Nutrition Goals Re-Evaluation     Row Name 03/08/23 1601             Goals   Current Weight 162 lb 7.7 oz (73.7 kg)       Comment AST 41, A1c 7.0, GFR 45, Cr 1.20, lipids improved WNL       Expected Outcome [name] is taking rybelsus for blood sugar support; she does not check her blood sugars. Her nephew is a good support. Patient will benefit from participation in pulmonary rehab rehab for nutrition, exercise, and lifestyle  modification.                Nutrition Goals Discharge (Final Nutrition Goals Re-Evaluation):  Nutrition Goals Re-Evaluation - 03/08/23 1601       Goals   Current Weight 162 lb 7.7 oz (73.7 kg)    Comment AST 41, A1c 7.0, GFR 45, Cr 1.20, lipids improved WNL    Expected Outcome [name] is taking rybelsus for blood sugar support; she does not check her blood sugars. Her nephew is a good support. Patient will benefit from participation in pulmonary rehab rehab for nutrition, exercise, and lifestyle modification.             Psychosocial: Target Goals: Acknowledge presence or absence of significant depression and/or stress, maximize coping skills, provide positive support system. Participant is able to verbalize types and ability to use techniques and skills needed for reducing stress and depression.  Initial Review & Psychosocial Screening:  Initial Psych Review & Screening - 02/23/23 1128       Initial Review   Current issues with History of Depression      Family Dynamics   Good Support System? Yes    Comments Nephew looks out after her, kids leave far away      Barriers   Psychosocial barriers to participate in program There are no identifiable barriers or psychosocial needs.      Screening Interventions   Interventions Encouraged to exercise             Quality of Life Scores:  Scores of 19 and below usually indicate a poorer quality of life in these areas.  A difference of  2-3 points is a clinically meaningful difference.  A difference of 2-3 points in the total score of the Quality of Life Index has been associated with significant improvement in overall quality of life, self-image, physical symptoms, and general health in studies assessing change in quality of life.  PHQ-9: Review Flowsheet  More data exists      02/23/2023 01/25/2023 01/04/2023 10/18/2022 09/30/2022  Depression screen PHQ 2/9  Decreased Interest 0 0 0 0 0  Down, Depressed, Hopeless 0 0 0 0  0  PHQ - 2 Score 0 0 0 0 0  Altered sleeping 0 - - - -  Tired, decreased energy 0 - - - -  Change in appetite 0 - - - -  Feeling bad or failure about yourself  0 - - - -  Trouble concentrating 0 - - - -  Moving slowly or fidgety/restless 0 - - - -  Suicidal thoughts 0 - - - -  PHQ-9 Score 0 - - - -   Interpretation of Total Score  Total Score Depression Severity:  1-4 =  Minimal depression, 5-9 = Mild depression, 10-14 = Moderate depression, 15-19 = Moderately severe depression, 20-27 = Severe depression   Psychosocial Evaluation and Intervention:  Psychosocial Evaluation - 02/23/23 1129       Psychosocial Evaluation & Interventions   Interventions Encouraged to exercise with the program and follow exercise prescription    Continue Psychosocial Services  No Follow up required             Psychosocial Re-Evaluation:  Psychosocial Re-Evaluation     Row Name 03/11/23 1053             Psychosocial Re-Evaluation   Current issues with History of Depression       Comments No changes since starting the program. Has attended 1 class so far.       Expected Outcomes For [place] to attended PR free of any psychosocial barriers or concerns.       Interventions Encouraged to attend Pulmonary Rehabilitation for the exercise       Continue Psychosocial Services  Follow up required by counselor                Psychosocial Discharge (Final Psychosocial Re-Evaluation):  Psychosocial Re-Evaluation - 03/11/23 1053       Psychosocial Re-Evaluation   Current issues with History of Depression    Comments No changes since starting the program. Has attended 1 class so far.    Expected Outcomes For [name] to attended PR free of any psychosocial barriers or concerns.    Interventions Encouraged to attend Pulmonary Rehabilitation for the exercise    Continue Psychosocial Services  Follow up required by counselor             Education: Education Goals: Education classes will be  provided on a weekly basis, covering required topics. Participant will state understanding/return demonstration of topics presented.  Learning Barriers/Preferences:   Education Topics: Introduction to Pulmonary Rehab Group instruction provided by PowerPoint, verbal discussion, and written material to support subject matter. Instructor reviews what Pulmonary Rehab is, the purpose of the program, and how patients are referred.     Know Your Numbers Group instruction that is supported by a PowerPoint presentation. Instructor discusses importance of knowing and understanding resting, exercise, and post-exercise oxygen saturation, heart rate, and blood pressure. Oxygen saturation, heart rate, blood pressure, rating of perceived exertion, and dyspnea are reviewed along with a normal range for these values.    Exercise for the Pulmonary Patient Group instruction that is supported by a PowerPoint presentation. Instructor discusses benefits of exercise, core components of exercise, frequency, duration, and intensity of an exercise routine, importance of utilizing pulse oximetry during exercise, safety while exercising, and options of places to exercise outside of rehab.  Flowsheet Row PULMONARY REHAB OTHER RESPIRATORY from 08/12/2016 in [facility] V[facility], & Lung [facility]  Date 08/05/16  Educator EP  Instruction Review Code (Retired) 2- meets goals/outcomes          MET Level  Group instruction provided by PowerPoint, verbal discussion, and written material to support subject matter. Instructor reviews what METs are and how to increase METs.    Pulmonary Medications Verbally interactive group education provided by instructor with focus on inhaled medications and proper administration.   Anatomy and Physiology of the Respiratory System Group instruction provided by PowerPoint, verbal discussion, and written material to support subject matter. Instructor  reviews respiratory cycle and anatomical components of the respiratory system and their functions. Instructor also reviews differences in  obstructive and restrictive respiratory diseases with examples of each.    Oxygen Safety Group instruction provided by PowerPoint, verbal discussion, and written material to support subject matter. There is an overview of "What is Oxygen" and "Why do we need it".  Instructor also reviews how to create a safe environment for oxygen use, the importance of using oxygen as prescribed, and the risks of noncompliance. There is a brief discussion on traveling with oxygen and resources the patient may utilize. Flowsheet Row PULMONARY REHAB OTHER RESPIRATORY from 08/12/2016 in [facility], Vascular, & Lung Health  Date 07/08/16  Educator rn  Instruction Review Code (Retired) 2- meets goals/outcomes       Oxygen Use Group instruction provided by PowerPoint, verbal discussion, and written material to discuss how supplemental oxygen is prescribed and different types of oxygen supply systems. Resources for more information are provided.    Breathing Techniques Group instruction that is supported by demonstration and informational handouts. Instructor discusses the benefits of pursed lip and diaphragmatic breathing and detailed demonstration on how to perform both.     Risk Factor Reduction Group instruction that is supported by a PowerPoint presentation. Instructor discusses the definition of a risk factor, different risk factors for pulmonary disease, and how the heart and lungs work together.   MD Day A group question and answer session with a medical doctor that allows participants to ask questions that relate to their pulmonary disease state.   Nutrition for the Pulmonary Patient Group instruction provided by PowerPoint slides, verbal discussion, and written materials to support subject matter. The instructor gives an  explanation and review of healthy diet recommendations, which includes a discussion on weight management, recommendations for fruit and vegetable consumption, as well as protein, fluid, caffeine, fiber, sodium, sugar, and alcohol. Tips for eating when patients are short of breath are discussed.    Other Education Group or individual verbal, written, or video instructions that support the educational goals of the pulmonary rehab program.    Knowledge Questionnaire Score:  Knowledge Questionnaire Score - 02/23/23 1140       Knowledge Questionnaire Score   Pre Score 16/18             Core Components/Risk Factors/Patient Goals at Admission:  Personal Goals and Risk Factors at Admission - 02/23/23 1129       Core Components/Risk Factors/Patient Goals on Admission    Weight Management Weight Loss    Improve shortness of breath with ADL's Yes    Intervention Provide education, individualized exercise plan and daily activity instruction to help decrease symptoms of SOB with activities of daily living.    Expected Outcomes Short Term: Improve cardiorespiratory fitness to achieve a reduction of symptoms when performing ADLs;Long Term: Be able to perform more ADLs without symptoms or delay the onset of symptoms             Core Components/Risk Factors/Patient Goals Review:   Goals and Risk Factor Review     Row Name 03/11/23 1055             Core Components/Risk Factors/Patient Goals Review   Personal Goals Review Weight Management/Obesity;Improve shortness of breath with ADL's;Develop more efficient breathing techniques such as purse lipped breathing and diaphragmatic breathing and practicing self-pacing with activity.       Review Goals not met. [name] has attended 1 class.       Expected Outcomes See admission goals  Core Components/Risk Factors/Patient Goals at Discharge (Final Review):   Goals and Risk Factor Review - 03/11/23 1055       Core  Components/Risk Factors/Patient Goals Review   Personal Goals Review Weight Management/Obesity;Improve shortness of breath with ADL's;Develop more efficient breathing techniques such as purse lipped breathing and diaphragmatic breathing and practicing self-pacing with activity.    Review Goals not met. [name] has attended 1 class.    Expected Outcomes See admission goals             ITP Comments: Pt is making expected progress toward Pulmonary Rehab goals after completing 2 sessions. Recommend continued exercise, life style modification, education, and utilization of breathing techniques to increase stamina and strength, while also decreasing shortness of breath with exertion.  Dr. Mechele Collin is Medical Director for Pulmonary Rehab at [facility].

## 2023-03-17 ENCOUNTER — Encounter (HOSPITAL_COMMUNITY)
Admission: RE | Admit: 2023-03-17 | Discharge: 2023-03-17 | Disposition: A | Discharge status: Home / Self Care | Payer: Medicare HMO | Source: Ambulatory Visit | Attending: Pulmonary Disease | Admitting: Pulmonary Disease

## 2023-03-17 ENCOUNTER — Telehealth (HOSPITAL_COMMUNITY): Payer: Self-pay

## 2023-03-17 DIAGNOSIS — J449 Chronic obstructive pulmonary disease, unspecified: Secondary | ICD-10-CM

## 2023-03-17 NOTE — Telephone Encounter (Signed)
Verbal order from Dr. [name] that in [facility] we can increase [name]'s target heart rate to 130bpm.

## 2023-03-17 NOTE — Progress Notes (Signed)
Daily Session Note  Patient Details  Name: ARCADIA GORGAS MRN: [id number] Date of Birth: [birth year] Referring Provider:   [name] Pulmonary Rehab Walk Test from 02/23/2023 in [facility] for Heart, Vascular, & Lung Health  Referring Provider [name]       Encounter Date: 03/17/2023  Check In:  Session Check In - 03/17/23 1410       Check-In   Supervising physician immediately available to respond to emergencies [facility] MD immediately available    Physician(s) Alvira Monday, NP    Location [facility]    Staff Present Samantha Belarus, RD, Dutch Gray, RN, BSN;Randi Reeve BS, ACSM-CEP, Exercise Physiologist;Kaylee Earlene Plater, MS, ACSM-CEP, Exercise Physiologist;Casey Katrinka Blazing, RT;Jetta Walker BS, ACSM-CEP, Exercise Physiologist    Virtual Visit No    Medication changes reported     No    Fall or balance concerns reported    No    Tobacco Cessation No Change    Warm-up and Cool-down Performed as group-led instruction    Resistance Training Performed Yes    VAD Patient? No    PAD/SET Patient? No      Pain Assessment   Currently in Pain? No/denies    Multiple Pain Sites No             Capillary Blood Glucose: No results found for this or any previous visit (from the past 24 hour(s)).    Social History   Tobacco Use  Smoking Status Former   Current packs/day: 0.00   Average packs/day: 1 pack/day for 56.0 years (56.0 ttl pk-yrs)   Types: Cigarettes   Start date: 06/22/1959   Quit date: 06/22/2015   Years since quitting: 7.7  Smokeless Tobacco Never    Goals Met:  Exercise tolerated well No report of concerns or symptoms today Strength training completed today  Goals Unmet:  Not Applicable  Comments: Service time is from 1321 to 1455    Dr. Mechele Collin is Medical Director for Pulmonary Rehab at [facility].

## 2023-03-22 ENCOUNTER — Encounter (HOSPITAL_COMMUNITY)
Admission: RE | Admit: 2023-03-22 | Discharge: 2023-03-22 | Disposition: A | Discharge status: Home / Self Care | Payer: Medicare HMO | Source: Ambulatory Visit | Attending: Pulmonary Disease | Admitting: Pulmonary Disease

## 2023-03-22 DIAGNOSIS — J449 Chronic obstructive pulmonary disease, unspecified: Secondary | ICD-10-CM | POA: Diagnosis not present

## 2023-03-22 NOTE — Progress Notes (Signed)
Daily Session Note  Patient Details  Name: [name] MRN: [id number] Date of Birth: [birth year] Referring Provider:   [name] Pulmonary Rehab Walk Test from 02/23/2023 in [facility] for Heart, Vascular, & Lung Health  Referring Provider [name]       Encounter Date: 03/22/2023  Check In:  Session Check In - 03/22/23 1431       Check-In   Supervising physician immediately available to respond to emergencies [facility] MD immediately available    Physician(s) Edd Fabian, NP    [facility]    Staff Present Samantha Belarus, RD, Dutch Gray, RN, BSN;Randi Reeve BS, ACSM-CEP, Exercise Physiologist;Kaylee Earlene Plater, MS, ACSM-CEP, Exercise Physiologist;Loghan Kurtzman Chester Holstein, MS, Exercise Physiologist    Virtual Visit No    Medication changes reported     No    Fall or balance concerns reported    No    Tobacco Cessation No Change    Warm-up and Cool-down Performed as group-led instruction    Resistance Training Performed Yes    VAD Patient? No    PAD/SET Patient? No      Pain Assessment   Currently in Pain? No/denies    Multiple Pain Sites No             Capillary Blood Glucose: No results found for this or any previous visit (from the past 24 hour(s)).    Social History   Tobacco Use  Smoking Status Former   Current packs/day: 0.00   Average packs/day: 1 pack/day for 56.0 years (56.0 ttl pk-yrs)   Types: Cigarettes   Start date: 06/22/1959   Quit date: 06/22/2015   Years since quitting: 7.7  Smokeless Tobacco Never    Goals Met:  Proper associated with RPD/PD & O2 Sat Independence with exercise equipment Exercise tolerated well No report of concerns or symptoms today Strength training completed today  Goals Unmet:  Not Applicable  Comments: Service time is from 1320 to 1440.    Dr. Mechele Collin is Medical Director for Pulmonary Rehab at [facility].

## 2023-03-24 ENCOUNTER — Encounter (HOSPITAL_COMMUNITY)
Admission: RE | Admit: 2023-03-24 | Discharge: 2023-03-24 | Disposition: A | Discharge status: Home / Self Care | Payer: Medicare HMO | Source: Ambulatory Visit | Attending: Pulmonary Disease | Admitting: Pulmonary Disease

## 2023-03-24 DIAGNOSIS — J449 Chronic obstructive pulmonary disease, unspecified: Secondary | ICD-10-CM

## 2023-03-29 ENCOUNTER — Encounter (HOSPITAL_COMMUNITY)
Admission: RE | Admit: 2023-03-29 | Discharge: 2023-03-29 | Disposition: A | Discharge status: Home / Self Care | Payer: Medicare HMO | Source: Ambulatory Visit | Attending: Pulmonary Disease | Admitting: Pulmonary Disease

## 2023-03-29 VITALS — Wt 163.8 lb

## 2023-03-29 DIAGNOSIS — J449 Chronic obstructive pulmonary disease, unspecified: Secondary | ICD-10-CM | POA: Diagnosis not present

## 2023-03-29 NOTE — Progress Notes (Signed)
Daily Session Note  Patient Details  Name: [name] MRN: [id number] Date of Birth: 1941/03/02 Referring Provider:   [name] Pulmonary Rehab Walk Test from 02/23/2023 in [facility] for Heart, Vascular, & Lung Health  Referring Provider [name]       Encounter Date: 03/29/2023  Check In:  Session Check In - 03/29/23 1524       Check-In   Supervising physician immediately available to respond to emergencies [facility] MD immediately available    Physician(s) Bernadene Person, NP    [facility]    Staff Present [name], MS, ACSM-CEP, Exercise Physiologist;Samantha Belarus, RD, Dutch Gray, RN, Zachery Conch, MS, ACSM-CEP, Exercise Physiologist;Acelyn Basham Katrinka Blazing, RT    Virtual Visit No    Medication changes reported     No    Fall or balance concerns reported    No    Tobacco Cessation No Change    Warm-up and Cool-down Performed as group-led instruction    Resistance Training Performed Yes    VAD Patient? No    PAD/SET Patient? No      Pain Assessment   Currently in Pain? No/denies    Multiple Pain Sites No             Capillary Blood Glucose: No results found for this or any previous visit (from the past 24 hour(s)).   Exercise Prescription Changes - 03/29/23 1500       Response to Exercise   Blood Pressure (Admit) 142/80    Blood Pressure (Exercise) 140/72    Blood Pressure (Exit) 124/76    Heart Rate (Admit) 109 bpm    Heart Rate (Exercise) 127 bpm    Heart Rate (Exit) 109 bpm    Oxygen Saturation (Admit) 94 %    Oxygen Saturation (Exercise) 93 %    Oxygen Saturation (Exit) 95 %    Rating of Perceived Exertion (Exercise) 11    Perceived Dyspnea (Exercise) 1    Duration Continue with 30 min of aerobic exercise without signs/symptoms of physical distress.    Intensity THRR unchanged      Progression   Progression Continue to progress workloads to maintain intensity without signs/symptoms of physical  distress.      Resistance Training   Training Prescription Yes    Weight red bands    Reps 10-15    Time 10 Minutes      NuStep   Level 2    Minutes 15    METs 1.9      Track   Laps 9    Minutes 15    METs 2.38             Social History   Tobacco Use  Smoking Status Former   Current packs/day: 0.00   Average packs/day: 1 pack/day for 56.0 years (56.0 ttl pk-yrs)   Types: Cigarettes   Start date: 06/22/1959   Quit date: 06/22/2015   Years since quitting: 7.7  Smokeless Tobacco Never    Goals Met:  Proper associated with RPD/PD & O2 Sat Independence with exercise equipment Exercise tolerated well No report of concerns or symptoms today Strength training completed today  Goals Unmet:  Not Applicable  Comments: Service time is from 1322 to 1445.    Dr. Mechele Collin is Medical Director for Pulmonary Rehab at [facility].

## 2023-03-31 ENCOUNTER — Other Ambulatory Visit: Payer: Self-pay | Admitting: Nurse Practitioner

## 2023-03-31 ENCOUNTER — Ambulatory Visit: Payer: Self-pay

## 2023-03-31 ENCOUNTER — Encounter (HOSPITAL_COMMUNITY): Payer: Medicare HMO

## 2023-03-31 NOTE — Patient Outreach (Signed)
  Care Coordination   03/31/2023 Name: [name] MRN: [id number] DOB: [birth year]   Care Coordination Outreach Attempts:  An unsuccessful telephone outreach was attempted for a scheduled appointment today.  Follow Up Plan:  Additional outreach attempts will be made to offer the patient care coordination information and services.   Encounter Outcome:  No Answer   Care Coordination Interventions:  No, not indicated    Delsa Sale, RN, BSN, CCM Care Management Coordinator [facility] Care Management  Direct Phone: [phone]

## 2023-04-02 ENCOUNTER — Other Ambulatory Visit: Payer: Self-pay | Admitting: Nurse Practitioner

## 2023-04-02 DIAGNOSIS — G4700 Insomnia, unspecified: Secondary | ICD-10-CM

## 2023-04-04 ENCOUNTER — Other Ambulatory Visit: Payer: Self-pay

## 2023-04-04 DIAGNOSIS — E1122 Type 2 diabetes mellitus with diabetic chronic kidney disease: Secondary | ICD-10-CM

## 2023-04-04 DIAGNOSIS — G4700 Insomnia, unspecified: Secondary | ICD-10-CM

## 2023-04-04 MED ORDER — VALACYCLOVIR HCL 500 MG PO TABS: 500.0000 mg | ORAL_TABLET | Freq: Every day | ORAL | 0 refills | Status: DC

## 2023-04-04 MED ORDER — EMPAGLIFLOZIN 10 MG PO TABS: 10.0000 mg | ORAL_TABLET | Freq: Every day | ORAL | 2 refills | Status: DC

## 2023-04-04 MED ORDER — MIRTAZAPINE 15 MG PO TABS: 15.0000 mg | ORAL_TABLET | Freq: Every day | ORAL | 1 refills | Status: DC

## 2023-04-04 MED ORDER — EZETIMIBE 10 MG PO TABS: 10.0000 mg | ORAL_TABLET | Freq: Every day | ORAL | 2 refills | Status: DC

## 2023-04-05 ENCOUNTER — Encounter (HOSPITAL_COMMUNITY)
Admission: RE | Admit: 2023-04-05 | Discharge: 2023-04-05 | Disposition: A | Discharge status: Home / Self Care | Payer: Medicare HMO | Source: Ambulatory Visit | Attending: Pulmonary Disease | Admitting: Pulmonary Disease

## 2023-04-05 DIAGNOSIS — J449 Chronic obstructive pulmonary disease, unspecified: Secondary | ICD-10-CM

## 2023-04-05 NOTE — Progress Notes (Signed)
Daily Session Note  Patient Details  Name: [name] MRN: [id number] Date of Birth: [birth year] Referring Provider:   [name] Pulmonary Rehab Walk Test from 02/23/2023 in [facility] for Heart, Vascular, & Lung Health  Referring Provider [name]       Encounter Date: 04/05/2023  Check In:  Session Check In - 04/05/23 1353       Check-In   Supervising physician immediately available to respond to emergencies [facility] MD immediately available    Physician(s) Robin Searing, NP    [facility]    Staff Present Essie Hart, RN, BSN;Casey Katrinka Blazing, RT;Kaylan Yates [facility] BS, ACSM-CEP, Exercise Physiologist;David Geraldine, MS, ACSM-CEP, CCRP, Exercise Physiologist    Virtual Visit No    Medication changes reported     No    Fall or balance concerns reported    No    Tobacco Cessation No Change    Warm-up and Cool-down Performed as group-led instruction    Resistance Training Performed Yes    VAD Patient? No      Pain Assessment   Currently in Pain? No/denies    Pain Score 0-No pain    Multiple Pain Sites No             Capillary Blood Glucose: No results found for this or any previous visit (from the past 24 hour(s)).    Social History   Tobacco Use  Smoking Status Former   Current packs/day: 0.00   Average packs/day: 1 pack/day for 56.0 years (56.0 ttl pk-yrs)   Types: Cigarettes   Start date: 06/22/1959   Quit date: 06/22/2015   Years since quitting: 7.7  Smokeless Tobacco Never    Goals Met:  Exercise tolerated well No report of concerns or symptoms today Strength training completed today  Goals Unmet:  Not Applicable  Comments: Service time is from 1322 to 1440.    Dr. Mechele Collin is Medical Director for Pulmonary Rehab at [facility].

## 2023-04-07 ENCOUNTER — Encounter (HOSPITAL_COMMUNITY)
Admission: RE | Admit: 2023-04-07 | Discharge: 2023-04-07 | Disposition: A | Discharge status: Home / Self Care | Payer: Medicare HMO | Source: Ambulatory Visit | Attending: Pulmonary Disease | Admitting: Pulmonary Disease

## 2023-04-07 DIAGNOSIS — J449 Chronic obstructive pulmonary disease, unspecified: Secondary | ICD-10-CM | POA: Diagnosis not present

## 2023-04-07 NOTE — Progress Notes (Signed)
Daily Session Note  Patient Details  Name: [name] MRN: [id number] Date of Birth: Nov 14, 1940 Referring Provider:   [name] Pulmonary Rehab Walk Test from 02/23/2023 in [facility] for Heart, Vascular, & Lung Health  Referring Provider [name]       Encounter Date: 04/07/2023  Check In:  Session Check In - 04/07/23 1417       Check-In   Supervising physician immediately available to respond to emergencies [facility] MD immediately available    Physician(s) [name], NP    [facility]    Staff Present Elissa Lovett BS, ACSM-CEP, Exercise Physiologist;Samantha Belarus, RD, Dutch Gray, RN, Marton Redwood, MS, ACSM-CEP, CCRP, Exercise Physiologist;Casey Katrinka Blazing, RT    Virtual Visit No    Medication changes reported     No    Fall or balance concerns reported    No    Tobacco Cessation No Change    Warm-up and Cool-down Performed as group-led instruction    Resistance Training Performed Yes    VAD Patient? No    PAD/SET Patient? No      Pain Assessment   Currently in Pain? No/denies    Multiple Pain Sites No             Capillary Blood Glucose: No results found for this or any previous visit (from the past 24 hour(s)).    Social History   Tobacco Use  Smoking Status Former   Current packs/day: 0.00   Average packs/day: 1 pack/day for 56.0 years (56.0 ttl pk-yrs)   Types: Cigarettes   Start date: 06/22/1959   Quit date: 06/22/2015   Years since quitting: 7.7  Smokeless Tobacco Never    Goals Met:  Independence with exercise equipment Exercise tolerated well No report of concerns or symptoms today Strength training completed today  Goals Unmet:  Not Applicable  Comments: Service time is from 1309 to 1452    Dr. Mechele Collin is Medical Director for Pulmonary Rehab at [facility].

## 2023-04-10 ENCOUNTER — Other Ambulatory Visit: Payer: Self-pay | Admitting: Pulmonary Disease

## 2023-04-11 NOTE — Addendum Note (Signed)
Encounter addended by: Essie Hart, RN on: 04/11/2023 3:57 PM  Actions taken: Flowsheet accepted

## 2023-04-11 NOTE — Addendum Note (Signed)
Encounter addended by: Essie Hart, RN on: 04/11/2023 3:54 PM  Actions taken: Flowsheet accepted

## 2023-04-12 ENCOUNTER — Encounter (HOSPITAL_COMMUNITY): Admission: RE | Admit: 2023-04-12 | Payer: Medicare HMO | Source: Ambulatory Visit

## 2023-04-12 VITALS — Wt 161.8 lb

## 2023-04-12 DIAGNOSIS — J449 Chronic obstructive pulmonary disease, unspecified: Secondary | ICD-10-CM

## 2023-04-12 NOTE — Progress Notes (Signed)
Daily Session Note  Patient Details  Name: [name] MRN: [id number] Date of Birth: [birth year] Referring Provider:   [name] Pulmonary Rehab Walk Test from 02/23/2023 in [facility]  Referring Provider [name]       Encounter Date: 04/12/2023  Check In:  Session Check In - 04/12/23 1351       Check-In   Supervising physician immediately available to respond to emergencies [facility] MD immediately available    Physician(s) [name], NP    Location [facility]    Staff Present [name], MS, ACSM-CEP, Exercise Physiologist;Randi Dionisio Paschal, ACSM-CEP, Exercise Physiologist;Samantha Belarus, RD, Dutch Gray, RN, BSN; , RT;Johnny Hale Bogus, MS, Exercise Physiologist    Virtual Visit No    Medication changes reported     No    Fall or balance concerns reported    No    Tobacco Cessation No Change    Warm-up and Cool-down Performed as group-led instruction    Resistance Training Performed Yes    VAD Patient? No    PAD/SET Patient? No      Pain Assessment   Currently in Pain? No/denies    Multiple Pain Sites No             Capillary Blood Glucose: No results found for this or any previous visit (from the past 24 hour(s)).   Exercise Prescription Changes - 04/12/23 1500       Response to Exercise   Blood Pressure (Admit) 118/64    Blood Pressure (Exercise) 160/70    Blood Pressure (Exit) 124/66    Heart Rate (Admit) 100 bpm    Heart Rate (Exercise) 112 bpm    Heart Rate (Exit) 106 bpm    Oxygen Saturation (Admit) 94 %    Oxygen Saturation (Exercise) 92 %    Oxygen Saturation (Exit) 93 %    Rating of Perceived Exertion (Exercise) 11    Perceived Dyspnea (Exercise) 1    Duration Continue with 30 min of aerobic exercise without signs/symptoms of physical distress.    Intensity THRR unchanged      Progression   Progression Continue to progress workloads to maintain  intensity without signs/symptoms of physical distress.      Resistance Training   Training Prescription Yes    Weight red bands    Reps 10-15    Time 10 Minutes      NuStep   Level 3    Minutes 15    METs 2      Track   Laps 9    Minutes 15    METs 2.38             Social History   Tobacco Use  Smoking Status Former   Current packs/day: 0.00   Average packs/day: 1 pack/day for 56.0 years (56.0 ttl pk-yrs)   Types: Cigarettes   Start date: 06/22/1959   Quit date: 06/22/2015   Years since quitting: 7.8  Smokeless Tobacco Never    Goals Met:  Proper associated with RPD/PD & O2 Sat Independence with exercise equipment Exercise tolerated well No report of concerns or symptoms today Strength training completed today  Goals Unmet:  Not Applicable  Comments: Service time is from 1316 to 1437.    Dr. Mechele Collin is Medical Director for Pulmonary Rehab at [facility].

## 2023-04-13 ENCOUNTER — Other Ambulatory Visit: Payer: Self-pay | Admitting: Nurse Practitioner

## 2023-04-13 DIAGNOSIS — R6881 Early satiety: Secondary | ICD-10-CM

## 2023-04-13 NOTE — Progress Notes (Signed)
Pulmonary Individual Treatment Plan  Patient Details  Name: [name] MRN: [id number] Date of Birth: [birth year] Referring Provider:   [name] Pulmonary Rehab Walk Test from 02/23/2023 in [facility]  Referring Provider [name]       Initial Encounter Date:  Flowsheet Row Pulmonary Rehab Walk Test from 02/23/2023 in [facility]  Date 02/23/23       Visit Diagnosis: Stage 2 moderate COPD by GOLD classification (HCC)  Patient's Home Medications on Admission:   Current Outpatient Medications:    albuterol (PROVENTIL) (2.5 MG/3ML) 0.083% nebulizer solution, Take 3 mLs (2.5 mg total) by nebulization every 6 (six) hours as needed for wheezing or shortness of breath., Disp: 75 mL, Rfl: 12   albuterol (VENTOLIN HFA) 108 (90 Base) MCG/ACT inhaler, INHALE 2 PUFFS BY MOUTH EVERY 6 HOURS AS NEEDED FOR WHEEZE OR SHORTNESS OF BREATH, Disp: 18 each, Rfl: 0   aspirin 81 MG tablet, Take 81 mg by mouth every evening. , Disp: , Rfl:    BIOTIN 5000 PO, Take 1 capsule by mouth daily at 2 am. Takes around 2PM, Disp: , Rfl:    Blood Glucose Monitoring Suppl (TRUE METRIX AIR GLUCOSE METER) w/Device KIT, Use to check blood sugar 4 times a day. Dx code e11.65, Disp: 1 kit, Rfl: 2   Calcium Carbonate-Vitamin D 600-400 MG-UNIT tablet, Take 1 tablet by mouth daily. (Patient taking differently: Take 1 tablet by mouth daily. 600 mg of calcium and 500 units of vitd), Disp:  , Rfl:    cycloSPORINE (RESTASIS) 0.05 % ophthalmic emulsion, 1 drop 2 (two) times daily., Disp: , Rfl:    donepezil (ARICEPT) 10 MG tablet, TAKE 1 TABLET BY MOUTH EVERY DAY IN THE EVENING, Disp: 90 tablet, Rfl: 1   empagliflozin (JARDIANCE) 10 MG TABS tablet, Take 1 tablet (10 mg total) by mouth daily., Disp: 30 tablet, Rfl: 2   ezetimibe (ZETIA) 10 MG tablet, Take 1 tablet (10 mg total) by mouth daily., Disp: 90 tablet,  Rfl: 2   flunisolide (NASALIDE) 25 MCG/ACT (0.025%) SOLN, PLACE 2 SPRAYS INTO THE NOSE 2 (TWO) TIMES DAILY. (Patient not taking: Reported on 02/23/2023), Disp: 25 mL, Rfl: 1   KLOR-CON M20 20 MEQ tablet, TAKE 1 TABLET BY MOUTH EVERY DAY WITH FOOD, Disp: 90 tablet, Rfl: 1   lubiprostone (AMITIZA) 8 MCG capsule, Take 1 capsule (8 mcg total) by mouth daily with breakfast., Disp: 90 capsule, Rfl: 1   magnesium oxide (MAG-OX) 400 MG tablet, Take 400 mg by mouth daily., Disp: , Rfl:    Melatonin 2.5 MG CAPS, Take 2.5 mg by mouth at bedtime as needed (sleep). , Disp: , Rfl:    mirtazapine (REMERON) 15 MG tablet, Take 1 tablet (15 mg total) by mouth at bedtime., Disp: 90 tablet, Rfl: 1   Multiple Vitamin (MULTIVITAMIN) tablet, Take 1 tablet by mouth daily., Disp: , Rfl:    niacin (VITAMIN B3) 500 MG ER tablet, TAKE 1 TABLET BY MOUTH EVERY DAY IN THE EVENING, Disp: 90 tablet, Rfl: 3   OVER THE COUNTER MEDICATION, Take 1 tablet by mouth daily., Disp: , Rfl:    pantoprazole (PROTONIX) 40 MG tablet, TAKE 1 TABLET BY MOUTH TWICE A DAY, Disp: 180 tablet, Rfl: 1   Probiotic Product (PROBIOTIC PO), Take by mouth., Disp: , Rfl:    rosuvastatin (CRESTOR) 10 MG tablet, TAKE 1 TABLET BY MOUTH EVERY DAY, Disp:  90 tablet, Rfl: 1   Semaglutide (RYBELSUS) 7 MG TABS, Take 1 tablet (7 mg total) by mouth daily. (Patient not taking: Reported on 02/23/2023), Disp: 90 tablet, Rfl: 1   TRELEGY ELLIPTA 100-62.5-25 MCG/ACT AEPB, INHALE 1 PUFF ONE TIME DAILY, Disp: 180 each, Rfl: 3   TRUE METRIX BLOOD GLUCOSE TEST test strip, USE AS INSTRUCTED TO CHECK BLOOD SUGAR TWICE DAILY, Disp: 200 strip, Rfl: 3   TRUEplus Lancets 33G MISC, TEST BLOOD SUGAR AS DIRECTED, Disp: 400 each, Rfl: 3   valACYclovir (VALTREX) 500 MG tablet, Take 1 tablet (500 mg total) by mouth daily., Disp: 90 tablet, Rfl: 0   White Petrolatum-Mineral Oil (SYSTANE NIGHTTIME OP), Place 1 drop into both eyes at bedtime as needed (for dry eyes). , Disp: , Rfl:   Past  Medical History: Past Medical History:  Diagnosis Date   Arthritis    Asthma    COPD (chronic obstructive pulmonary disease) (HCC)    Depression    Diabetes mellitus without complication (HCC)    Diverticulitis    GERD (gastroesophageal reflux disease)    Hyperlipidemia    Hypertension 06/15/2016   left lung ca dx'd 2016   Thyroid nodule     Tobacco Use: Social History   Tobacco Use  Smoking Status Former   Current packs/day: 0.00   Average packs/day: 1 pack/day for 56.0 years (56.0 ttl pk-yrs)   Types: Cigarettes   Start date: 06/22/1959   Quit date: 06/22/2015   Years since quitting: 7.8  Smokeless Tobacco Never    Labs: Review Flowsheet  More data exists      Latest Ref Rng & Units 08/24/2021 12/30/2021 05/03/2022 10/18/2022 01/04/2023  Labs for ITP Cardiac and Pulmonary Rehab  Cholestrol 100 - 199 mg/dL 045  409  - 811  914   LDL (calc) 0 - 99 mg/dL 782  956  - 213  44   HDL-C >39 mg/dL 47  45  - 48  50   Trlycerides 0 - 149 mg/dL 086  578  - 469  92   Hemoglobin A1c 4.8 - 5.6 % 6.6  6.9  6.9  6.9  7.0     Details            Capillary Blood Glucose: Lab Results  Component Value Date   GLUCAP 144 (H) 03/15/2023   GLUCAP 160 (H) 03/15/2023   GLUCAP 160 (H) 03/08/2023   GLUCAP 184 (H) 03/08/2023   GLUCAP 104 (H) 02/23/2023    POCT Glucose     Row Name 02/23/23 1120 02/23/23 1230           POCT Blood Glucose   Pre-Exercise 125 mg/dL --      Post-Exercise -- 103 mg/dL               Pulmonary Assessment Scores:  Pulmonary Assessment Scores     Row Name 02/23/23 1142         ADL UCSD   SOB Score total 28       CAT Score   CAT Score 18       mMRC Score   mMRC Score 3             UCSD: Self-administered rating of dyspnea associated with activities of daily living (ADLs) 6-point scale (0 = "not at all" to 5 = "maximal or unable to do because of breathlessness")  Scoring Scores range from 0 to 120.  Minimally important difference  is 5 units  CAT: CAT  can identify the health impairment of COPD patients and is better correlated with disease progression.  CAT has a scoring range of zero to 40. The CAT score is classified into four groups of low (less than 10), medium (10 - 20), high (21-30) and very high (31-40) based on the impact level of disease on health status. A CAT score over 10 suggests significant symptoms.  A worsening CAT score could be explained by an exacerbation, poor medication adherence, poor inhaler technique, or progression of COPD or comorbid conditions.  CAT MCID is 2 points  mMRC: mMRC (Modified Medical Research Council) Dyspnea Scale is used to assess the degree of baseline functional disability in patients of respiratory disease due to dyspnea. No minimal important difference is established. A decrease in score of 1 point or greater is considered a positive change.   Pulmonary Function Assessment:  Pulmonary Function Assessment - 02/23/23 1142       Breath   Bilateral Breath Sounds Wheezes    Shortness of Breath Yes;Limiting activity             Exercise Target Goals: Exercise Program Goal: Individual exercise prescription set using results from initial 6 min walk test and THRR while considering  patient's activity barriers and safety.   Exercise Prescription Goal: Initial exercise prescription builds to 30-45 minutes a day of aerobic activity, 2-3 days per week.  Home exercise guidelines will be given to patient during program as part of exercise prescription that the participant will acknowledge.  Activity Barriers & Risk Stratification:  Activity Barriers & Cardiac Risk Stratification - 02/23/23 1124       Activity Barriers & Cardiac Risk Stratification   Activity Barriers Back Problems;Other (comment);Deconditioning;Muscular Weakness;Shortness of Breath    Comments left shoulder pain             6 Minute Walk:  6 Minute Walk     Row Name 02/23/23 1411         6  Minute Walk   Phase Initial     Distance 885 feet     Walk Time 6 minutes     # of Rest Breaks 0     MPH 1.68     METS 1.6     RPE 10     Perceived Dyspnea  1     VO2 Peak 5.6     Resting HR 76 bpm     Resting BP 130/62     Resting Oxygen Saturation  97 %     Exercise Oxygen Saturation  during 6 min walk 91 %     Max Ex. HR 103 bpm     Max Ex. BP 146/68     2 Minute Post BP 124/60       Interval HR   1 Minute HR 80     2 Minute HR 93     3 Minute HR 96     4 Minute HR 98     5 Minute HR 100     6 Minute HR 103     2 Minute Post HR 81     Interval Heart Rate? Yes       Interval Oxygen   Interval Oxygen? Yes     Baseline Oxygen Saturation % 97 %     1 Minute Oxygen Saturation % 96 %     1 Minute Liters of Oxygen 0 L     2 Minute Oxygen Saturation % 92 %     2  Minute Liters of Oxygen 0 L     3 Minute Oxygen Saturation % 92 %     3 Minute Liters of Oxygen 0 L     4 Minute Oxygen Saturation % 91 %     4 Minute Liters of Oxygen 0 L     5 Minute Oxygen Saturation % 92 %     5 Minute Liters of Oxygen 0 L     6 Minute Oxygen Saturation % 92 %     6 Minute Liters of Oxygen 0 L     2 Minute Post Oxygen Saturation % 95 %     2 Minute Post Liters of Oxygen 0 L              Oxygen Initial Assessment:  Oxygen Initial Assessment - 02/23/23 1535       Home Oxygen   Home Oxygen Device None    Sleep Oxygen Prescription None    Home Exercise Oxygen Prescription None    Home Resting Oxygen Prescription None      Initial 6 min Walk   Oxygen Used None      Program Oxygen Prescription   Program Oxygen Prescription None      Intervention   Short Term Goals To learn and exhibit compliance with exercise, home and travel O2 prescription;To learn and understand importance of maintaining oxygen saturations>88%;To learn and understand importance of monitoring SPO2 with pulse oximeter and demonstrate accurate use of the pulse oximeter.;To learn and demonstrate proper pursed lip  breathing techniques or other breathing techniques. ;To learn and demonstrate proper use of respiratory medications    Long  Term Goals Maintenance of O2 saturations>88%;Verbalizes importance of monitoring SPO2 with pulse oximeter and return demonstration;Exhibits proper breathing techniques, such as pursed lip breathing or other method taught during program session;Demonstrates proper use of MDI's;Exhibits compliance with exercise, home  and travel O2 prescription;Compliance with respiratory medication             Oxygen Re-Evaluation:  Oxygen Re-Evaluation     Row Name 03/09/23 1533 04/08/23 0956           Program Oxygen Prescription   Program Oxygen Prescription None None        Home Oxygen   Home Oxygen Device None None      Sleep Oxygen Prescription None None      Home Exercise Oxygen Prescription None None      Home Resting Oxygen Prescription None None      Compliance with Home Oxygen Use Yes Yes        Goals/Expected Outcomes   Short Term Goals To learn and exhibit compliance with exercise, home and travel O2 prescription;To learn and understand importance of maintaining oxygen saturations>88%;To learn and demonstrate proper use of respiratory medications;To learn and understand importance of monitoring SPO2 with pulse oximeter and demonstrate accurate use of the pulse oximeter.;To learn and demonstrate proper pursed lip breathing techniques or other breathing techniques.  To learn and exhibit compliance with exercise, home and travel O2 prescription;To learn and understand importance of maintaining oxygen saturations>88%;To learn and demonstrate proper use of respiratory medications;To learn and understand importance of monitoring SPO2 with pulse oximeter and demonstrate accurate use of the pulse oximeter.;To learn and demonstrate proper pursed lip breathing techniques or other breathing techniques.       Long  Term Goals Exhibits compliance with exercise, home  and travel O2  prescription;Verbalizes importance of monitoring SPO2 with pulse oximeter and return demonstration;Maintenance of O2  saturations>88%;Exhibits proper breathing techniques, such as pursed lip breathing or other method taught during program session;Compliance with respiratory medication;Demonstrates proper use of MDI's Exhibits compliance with exercise, home  and travel O2 prescription;Verbalizes importance of monitoring SPO2 with pulse oximeter and return demonstration;Maintenance of O2 saturations>88%;Exhibits proper breathing techniques, such as pursed lip breathing or other method taught during program session;Compliance with respiratory medication;Demonstrates proper use of MDI's      Comments no O2 needs no O2 needs      Goals/Expected Outcomes Compliance and understanding of oxygen saturations monitoring and breathing techniques to decrease shortness of breath. Compliance and understanding of oxygen saturations monitoring and breathing techniques to decrease shortness of breath.               Oxygen Discharge (Final Oxygen Re-Evaluation):  Oxygen Re-Evaluation - 04/08/23 0956       Program Oxygen Prescription   Program Oxygen Prescription None      Home Oxygen   Home Oxygen Device None    Sleep Oxygen Prescription None    Home Exercise Oxygen Prescription None    Home Resting Oxygen Prescription None    Compliance with Home Oxygen Use Yes      Goals/Expected Outcomes   Short Term Goals To learn and exhibit compliance with exercise, home and travel O2 prescription;To learn and understand importance of maintaining oxygen saturations>88%;To learn and demonstrate proper use of respiratory medications;To learn and understand importance of monitoring SPO2 with pulse oximeter and demonstrate accurate use of the pulse oximeter.;To learn and demonstrate proper pursed lip breathing techniques or other breathing techniques.     Long  Term Goals Exhibits compliance with exercise, home  and travel  O2 prescription;Verbalizes importance of monitoring SPO2 with pulse oximeter and return demonstration;Maintenance of O2 saturations>88%;Exhibits proper breathing techniques, such as pursed lip breathing or other method taught during program session;Compliance with respiratory medication;Demonstrates proper use of MDI's    Comments no O2 needs    Goals/Expected Outcomes Compliance and understanding of oxygen saturations monitoring and breathing techniques to decrease shortness of breath.             Initial Exercise Prescription:  Initial Exercise Prescription - 02/23/23 1200       Date of Initial Exercise RX and Referring Provider   Date 02/23/23    Referring Provider [name]    Expected Discharge Date 05/19/23      NuStep   Level 1    SPM 60    Minutes 15    METs 2.5      Track   Minutes 15    METs 2.5      Prescription Details   Frequency (times per week) 2    Duration Progress to 30 minutes of continuous aerobic without signs/symptoms of physical distress      Intensity   THRR 40-80% of Max Heartrate 56-111    Ratings of Perceived Exertion 11-13    Perceived Dyspnea 0-4      Progression   Progression Continue progressive overload as per policy without signs/symptoms or physical distress.      Resistance Training   Training Prescription Yes    Weight red bands    Reps 10-15             Perform Capillary Blood Glucose checks as needed.  Exercise Prescription Changes:   Exercise Prescription Changes     Row Name 03/15/23 1500 03/29/23 1500 04/12/23 1500         Response to Exercise  Blood Pressure (Admit) 138/70 142/80 118/64     Blood Pressure (Exercise) 142/72 140/72 160/70     Blood Pressure (Exit) 132/54 124/76 124/66     Heart Rate (Admit) 113 bpm 109 bpm 100 bpm     Heart Rate (Exercise) 116 bpm 127 bpm 112 bpm     Heart Rate (Exit) 109 bpm 109 bpm 106 bpm     Oxygen Saturation (Admit) 91 % 94 % 94 %     Oxygen Saturation (Exercise) 92 % 93 %  92 %     Oxygen Saturation (Exit) 93 % 95 % 93 %     Rating of Perceived Exertion (Exercise) 11 11 11      Perceived Dyspnea (Exercise) 1 1 1      Duration Continue with 30 min of aerobic exercise without signs/symptoms of physical distress. Continue with 30 min of aerobic exercise without signs/symptoms of physical distress. Continue with 30 min of aerobic exercise without signs/symptoms of physical distress.     Intensity THRR unchanged THRR unchanged THRR unchanged       Progression   Progression Continue to progress workloads to maintain intensity without signs/symptoms of physical distress. Continue to progress workloads to maintain intensity without signs/symptoms of physical distress. Continue to progress workloads to maintain intensity without signs/symptoms of physical distress.       Resistance Training   Training Prescription Yes Yes Yes     Weight red bands red bands red bands     Reps 10-15 10-15 10-15     Time 10 Minutes 10 Minutes 10 Minutes       NuStep   Level 1 2 3      Minutes 15 15 15      METs 1.8 1.9 2       Track   Laps 10 9 9      Minutes 15 15 15      METs 2.54 2.38 2.38              Exercise Comments:   Exercise Comments     Row Name 03/08/23 1606           Exercise Comments Pt completed first day of group exercise. She walked track 15 min with walker, METs 2.23. Pt then exercised on the recumbent stepper for 15 min, level 1, METs 1.7. Tolerated fair. Performed warm up and cool down with verbal and demonstrative cues. discussed METs                Exercise Goals and Review:   Exercise Goals     Row Name 02/23/23 1125 03/09/23 1530 04/08/23 0950         Exercise Goals   Increase Physical Activity Yes Yes Yes     Intervention Provide advice, education, support and counseling about physical activity/exercise needs.;Develop an individualized exercise prescription for aerobic and resistive training based on initial evaluation findings, risk  stratification, comorbidities and participant's personal goals. Provide advice, education, support and counseling about physical activity/exercise needs.;Develop an individualized exercise prescription for aerobic and resistive training based on initial evaluation findings, risk stratification, comorbidities and participant's personal goals. Provide advice, education, support and counseling about physical activity/exercise needs.;Develop an individualized exercise prescription for aerobic and resistive training based on initial evaluation findings, risk stratification, comorbidities and participant's personal goals.     Expected Outcomes Short Term: Attend rehab on a regular basis to increase amount of physical activity.;Long Term: Exercising regularly at least 3-5 days a week.;Long Term: Add in home exercise to make  exercise part of routine and to increase amount of physical activity. Short Term: Attend rehab on a regular basis to increase amount of physical activity.;Long Term: Exercising regularly at least 3-5 days a week.;Long Term: Add in home exercise to make exercise part of routine and to increase amount of physical activity. Short Term: Attend rehab on a regular basis to increase amount of physical activity.;Long Term: Exercising regularly at least 3-5 days a week.;Long Term: Add in home exercise to make exercise part of routine and to increase amount of physical activity.     Increase Strength and Stamina Yes Yes Yes     Intervention Provide advice, education, support and counseling about physical activity/exercise needs.;Develop an individualized exercise prescription for aerobic and resistive training based on initial evaluation findings, risk stratification, comorbidities and participant's personal goals. Provide advice, education, support and counseling about physical activity/exercise needs.;Develop an individualized exercise prescription for aerobic and resistive training based on initial  evaluation findings, risk stratification, comorbidities and participant's personal goals. Provide advice, education, support and counseling about physical activity/exercise needs.;Develop an individualized exercise prescription for aerobic and resistive training based on initial evaluation findings, risk stratification, comorbidities and participant's personal goals.     Expected Outcomes Short Term: Increase workloads from initial exercise prescription for resistance, speed, and METs.;Short Term: Perform resistance training exercises routinely during rehab and add in resistance training at home;Long Term: Improve cardiorespiratory fitness, muscular endurance and strength as measured by increased METs and functional capacity ( ) Short Term: Increase workloads from initial exercise prescription for resistance, speed, and METs.;Short Term: Perform resistance training exercises routinely during rehab and add in resistance training at home;Long Term: Improve cardiorespiratory fitness, muscular endurance and strength as measured by increased METs and functional capacity ( ) Short Term: Increase workloads from initial exercise prescription for resistance, speed, and METs.;Short Term: Perform resistance training exercises routinely during rehab and add in resistance training at home;Long Term: Improve cardiorespiratory fitness, muscular endurance and strength as measured by increased METs and functional capacity ( )     Able to understand and use rate of perceived exertion (RPE) scale Yes Yes Yes     Intervention Provide education and explanation on how to use RPE scale Provide education and explanation on how to use RPE scale Provide education and explanation on how to use RPE scale     Expected Outcomes Short Term: Able to use RPE daily in rehab to express subjective intensity level;Long Term:  Able to use RPE to guide intensity level when exercising independently Short Term: Able to use RPE daily in rehab  to express subjective intensity level;Long Term:  Able to use RPE to guide intensity level when exercising independently Short Term: Able to use RPE daily in rehab to express subjective intensity level;Long Term:  Able to use RPE to guide intensity level when exercising independently     Able to understand and use Dyspnea scale Yes Yes Yes     Intervention Provide education and explanation on how to use Dyspnea scale Provide education and explanation on how to use Dyspnea scale Provide education and explanation on how to use Dyspnea scale     Expected Outcomes Short Term: Able to use Dyspnea scale daily in rehab to express subjective sense of shortness of breath during exertion;Long Term: Able to use Dyspnea scale to guide intensity level when exercising independently Short Term: Able to use Dyspnea scale daily in rehab to express subjective sense of shortness of breath during exertion;Long Term: Able to use Dyspnea scale to  guide intensity level when exercising independently Short Term: Able to use Dyspnea scale daily in rehab to express subjective sense of shortness of breath during exertion;Long Term: Able to use Dyspnea scale to guide intensity level when exercising independently     Knowledge and understanding of Target Heart Rate Range (THRR) Yes Yes Yes     Intervention Provide education and explanation of THRR including how the numbers were predicted and where they are located for reference Provide education and explanation of THRR including how the numbers were predicted and where they are located for reference Provide education and explanation of THRR including how the numbers were predicted and where they are located for reference     Expected Outcomes Short Term: Able to state/look up THRR;Short Term: Able to use daily as guideline for intensity in rehab;Long Term: Able to use THRR to govern intensity when exercising independently Short Term: Able to state/look up THRR;Short Term: Able to use daily  as guideline for intensity in rehab;Long Term: Able to use THRR to govern intensity when exercising independently Short Term: Able to state/look up THRR;Short Term: Able to use daily as guideline for intensity in rehab;Long Term: Able to use THRR to govern intensity when exercising independently     Understanding of Exercise Prescription Yes Yes Yes     Expected Outcomes Short Term: Able to explain program exercise prescription;Long Term: Able to explain home exercise prescription to exercise independently Short Term: Able to explain program exercise prescription;Long Term: Able to explain home exercise prescription to exercise independently Short Term: Able to explain program exercise prescription;Long Term: Able to explain home exercise prescription to exercise independently              Exercise Goals Re-Evaluation :  Exercise Goals Re-Evaluation     Row Name 03/09/23 1530 04/08/23 0950           Exercise Goal Re-Evaluation   Exercise Goals Review Increase Physical Activity;Able to understand and use Dyspnea scale;Understanding of Exercise Prescription;Increase Strength and Stamina;Knowledge and understanding of Target Heart Rate Range (THRR);Able to understand and use rate of perceived exertion (RPE) scale Increase Physical Activity;Able to understand and use Dyspnea scale;Understanding of Exercise Prescription;Increase Strength and Stamina;Knowledge and understanding of Target Heart Rate Range (THRR);Able to understand and use rate of perceived exertion (RPE) scale      Comments Pt has completed one day of group exercise. She walked track 15 min with walker, METs 2.23. Pt then exercised on the recumbent stepper for 15 min, level 1, METs 1.7. Tolerated fair. Performed warm up and cool down with verbal and demonstrative cues. Will progress as tolerated. Pt has completed 8 day of group exercise, missing 1 session. She walked track 15 min with walker, METs 2.23. Pt then exercised on the  recumbent stepper for 15 min, level 2, METs 1.9. Tolerating fair, has not began to progress. Performs warm up and cool down with support in front of her. Will progress as tolerated.      Expected Outcomes Through exercise at rehab and at home, the patient will decrease shortness of breath with daily activities and feel confident in carrying out an exercise regime at home. Through exercise at rehab and at home, the patient will decrease shortness of breath with daily activities and feel confident in carrying out an exercise regime at home.               Discharge Exercise Prescription (Final Exercise Prescription Changes):  Exercise Prescription Changes - 04/12/23 1500  Response to Exercise   Blood Pressure (Admit) 118/64    Blood Pressure (Exercise) 160/70    Blood Pressure (Exit) 124/66    Heart Rate (Admit) 100 bpm    Heart Rate (Exercise) 112 bpm    Heart Rate (Exit) 106 bpm    Oxygen Saturation (Admit) 94 %    Oxygen Saturation (Exercise) 92 %    Oxygen Saturation (Exit) 93 %    Rating of Perceived Exertion (Exercise) 11    Perceived Dyspnea (Exercise) 1    Duration Continue with 30 min of aerobic exercise without signs/symptoms of physical distress.    Intensity THRR unchanged      Progression   Progression Continue to progress workloads to maintain intensity without signs/symptoms of physical distress.      Resistance Training   Training Prescription Yes    Weight red bands    Reps 10-15    Time 10 Minutes      NuStep   Level 3    Minutes 15    METs 2      Track   Laps 9    Minutes 15    METs 2.38             Nutrition:  Target Goals: Understanding of nutrition guidelines, daily intake of sodium 1500mg , cholesterol 200mg , calories 30% from fat and 7% or less from saturated fats, daily to have 5 or more servings of fruits and vegetables.  Biometrics:  Pre Biometrics - 02/23/23 1423       Pre Biometrics   Grip Strength 23 kg               Nutrition Therapy Plan and Nutrition Goals:  Nutrition Therapy & Goals - 04/08/23 1008       Nutrition Therapy   Diet Heart Healthy Diet    Drug/Food Interactions Statins/Certain Fruits      Personal Nutrition Goals   Nutrition Goal Patient to improve diet quality by using the plate method as a guide for meal planning to include lean protein/plant protein, fruits, vegetables, whole grains, nonfat dairy as part of a well-balanced diet.   goal in progress.   Comments Goals in progress. Melana is taking rybelsus for blood sugar support; she does not check her blood sugars. She reports decreased appetite; however, she is up 1.3# since starting with our program. She reports that she has made many dietary changes including reduced simple sugars/refined carbohydrate snacking during the day and at night. She continues to eat two meal daily. She is drinking one Boost glucose control daily with breakfast (190kcals, 16g protein). Her nephew is a good support. Patient will benefit from participation in pulmonary rehab rehab for nutrition, exercise, and lifestyle modification.      Intervention Plan   Intervention Prescribe, educate and counsel regarding individualized specific dietary modifications aiming towards targeted core components such as weight, hypertension, lipid management, diabetes, heart failure and other comorbidities.;Nutrition handout(s) given to patient.    Expected Outcomes Short Term Goal: Understand basic principles of dietary content, such as calories, fat, sodium, cholesterol and nutrients.;Long Term Goal: Adherence to prescribed nutrition plan.             Nutrition Assessments:  Nutrition Assessments - 03/15/23 1553       Rate Your Plate Scores   Pre Score 67            MEDIFICTS Score Key: ?70 Need to make dietary changes  40-70 Heart Healthy Diet ? 40 Therapeutic Level Cholesterol  Diet  Flowsheet Row PULMONARY REHAB CHRONIC OBSTRUCTIVE PULMONARY DISEASE  from [facility]/15/2023 in [facility]  Picture Your Plate Total Score on Admission 67      Picture Your Plate Scores: <16 Unhealthy dietary pattern with much room for improvement. 41-50 Dietary pattern unlikely to meet recommendations for good health and room for improvement. 51-60 More healthful dietary pattern, with some room for improvement.  >60 Healthy dietary pattern, although there may be some specific behaviors that could be improved.    Nutrition Goals Re-Evaluation:  Nutrition Goals Re-Evaluation     Row Name 03/08/23 1601 04/08/23 1008           Goals   Current Weight 162 lb 7.7 oz (73.7 kg) 161 lb 6 oz (73.2 kg)      Comment AST 41, A1c 7.0, GFR 45, Cr 1.20, lipids improved WNL no new labs; most recent labs AST 41, A1c 7.0, GFR 45, Cr 1.20, lipids improved WNL      Expected Outcome Sonika is taking rybelsus for blood sugar support; she does not check her blood sugars. Her nephew is a good support. Patient will benefit from participation in pulmonary rehab rehab for nutrition, exercise, and lifestyle modification. Goals in progress. Alaire is taking rybelsus for blood sugar support; she does not check her blood sugars. She reports decreased appetite; however, she is up 1.3# since starting with our program. She reports that she has made many dietary changes including reduced simple sugars/refined carbohydrate snacking during the day and at night. She continues to eat two meal daily. She is drinking one Boost glucose control daily with breakfast (190kcals, 16g protein). Her nephew is a good support. Patient will benefit from participation in pulmonary rehab rehab for nutrition, exercise, and lifestyle modification.               Nutrition Goals Discharge (Final Nutrition Goals Re-Evaluation):  Nutrition Goals Re-Evaluation - 04/08/23 1008       Goals   Current Weight 161 lb 6 oz (73.2 kg)    Comment no new labs; most  recent labs AST 41, A1c 7.0, GFR 45, Cr 1.20, lipids improved WNL    Expected Outcome Goals in progress. Rayette is taking rybelsus for blood sugar support; she does not check her blood sugars. She reports decreased appetite; however, she is up 1.3# since starting with our program. She reports that she has made many dietary changes including reduced simple sugars/refined carbohydrate snacking during the day and at night. She continues to eat two meal daily. She is drinking one Boost glucose control daily with breakfast (190kcals, 16g protein). Her nephew is a good support. Patient will benefit from participation in pulmonary rehab rehab for nutrition, exercise, and lifestyle modification.             Psychosocial: Target Goals: Acknowledge presence or absence of significant depression and/or stress, maximize coping skills, provide positive support system. Participant is able to verbalize types and ability to use techniques and skills needed for reducing stress and depression.  Initial Review & Psychosocial Screening:  Initial Psych Review & Screening - 02/23/23 1128       Initial Review   Current issues with History of Depression      Family Dynamics   Good Support System? Yes    Comments Nephew looks out after her, kids leave far away      Barriers   Psychosocial barriers to participate in program There are no  identifiable barriers or psychosocial needs.      Screening Interventions   Interventions Encouraged to exercise             Quality of Life Scores:  Scores of 19 and below usually indicate a poorer quality of life in these areas.  A difference of  2-3 points is a clinically meaningful difference.  A difference of 2-3 points in the total score of the Quality of Life Index has been associated with significant improvement in overall quality of life, self-image, physical symptoms, and general health in studies assessing change in quality of life.  PHQ-9: Review Flowsheet   More data exists      02/23/2023 01/25/2023 01/04/2023 10/18/2022 09/30/2022  Depression screen PHQ 2/9  Decreased Interest 0 0 0 0 0  Down, Depressed, Hopeless 0 0 0 0 0  PHQ - 2 Score 0 0 0 0 0  Altered sleeping 0 - - - -  Tired, decreased energy 0 - - - -  Change in appetite 0 - - - -  Feeling bad or failure about yourself  0 - - - -  Trouble concentrating 0 - - - -  Moving slowly or fidgety/restless 0 - - - -  Suicidal thoughts 0 - - - -  PHQ-9 Score 0 - - - -    Details           Interpretation of Total Score  Total Score Depression Severity:  1-4 = Minimal depression, 5-9 = Mild depression, 10-14 = Moderate depression, 15-19 = Moderately severe depression, 20-27 = Severe depression   Psychosocial Evaluation and Intervention:  Psychosocial Evaluation - 02/23/23 1129       Psychosocial Evaluation & Interventions   Interventions Encouraged to exercise with the program and follow exercise prescription    Comments Etana denies any psychosocial barriers or concerns    Expected Outcomes For Shaterrica to participate in RP without any psychosocial barriers or concerns    Continue Psychosocial Services  No Follow up required             Psychosocial Re-Evaluation:  Psychosocial Re-Evaluation     Row Name 03/11/23 1053 04/11/23 1555           Psychosocial Re-Evaluation   Current issues with History of Depression History of Depression      Comments No changes since starting the program. Has attended 1 class so far. Kemper states her mental health is stable. She denies any psychosocial barriers or concerns.      Expected Outcomes For Britain to attended PR free of any psychosocial barriers or concerns. For Kimberlynn to attended PR free of any psychosocial barriers or concerns.      Interventions Encouraged to attend Pulmonary Rehabilitation for the exercise Encouraged to attend Pulmonary Rehabilitation for the exercise      Continue Psychosocial Services  No Follow up required  No Follow up required               Psychosocial Discharge (Final Psychosocial Re-Evaluation):  Psychosocial Re-Evaluation - 04/11/23 1555       Psychosocial Re-Evaluation   Current issues with History of Depression    Comments Kejuana states her mental health is stable. She denies any psychosocial barriers or concerns.    Expected Outcomes For Ammi to attended PR free of any psychosocial barriers or concerns.    Interventions Encouraged to attend Pulmonary Rehabilitation for the exercise    Continue Psychosocial Services  No Follow up required  Education: Education Goals: Education classes will be provided on a weekly basis, covering required topics. Participant will state understanding/return demonstration of topics presented.  Learning Barriers/Preferences:   Education Topics: Introduction to Pulmonary Rehab Group instruction provided by PowerPoint, verbal discussion, and written material to support subject matter. Instructor reviews what Pulmonary Rehab is, the purpose of the program, and how patients are referred.     Know Your Numbers Group instruction that is supported by a PowerPoint presentation. Instructor discusses importance of knowing and understanding resting, exercise, and post-exercise oxygen saturation, heart rate, and blood pressure. Oxygen saturation, heart rate, blood pressure, rating of perceived exertion, and dyspnea are reviewed along with a normal range for these values.  Flowsheet Row PULMONARY REHAB CHRONIC OBSTRUCTIVE PULMONARY DISEASE from 03/17/2023 in [facility]  Date 03/17/23  Educator EP  Instruction Review Code 1- Verbalizes Understanding       Exercise for the Pulmonary Patient Group instruction that is supported by a PowerPoint presentation. Instructor discusses benefits of exercise, core components of exercise, frequency, duration, and intensity of an exercise  routine, importance of utilizing pulse oximetry during exercise, safety while exercising, and options of places to exercise outside of rehab.  Flowsheet Row PULMONARY REHAB OTHER RESPIRATORY from 08/12/2016 in [facility]  Date 08/05/16  Educator EP  Instruction Review Code (Retired) 2- meets goals/outcomes          MET Level  Group instruction provided by PowerPoint, verbal discussion, and written material to support subject matter. Instructor reviews what METs are and how to increase METs.    Pulmonary Medications Verbally interactive group education provided by instructor with focus on inhaled medications and proper administration.   Anatomy and Physiology of the Respiratory System Group instruction provided by PowerPoint, verbal discussion, and written material to support subject matter. Instructor reviews respiratory cycle and anatomical components of the respiratory system and their functions. Instructor also reviews differences in obstructive and restrictive respiratory diseases with examples of each.    Oxygen Safety Group instruction provided by PowerPoint, verbal discussion, and written material to support subject matter. There is an overview of "What is Oxygen" and "Why do we need it".  Instructor also reviews how to create a safe environment for oxygen use, the importance of using oxygen as prescribed, and the risks of noncompliance. There is a brief discussion on traveling with oxygen and resources the patient may utilize. Flowsheet Row PULMONARY REHAB OTHER RESPIRATORY from 08/12/2016 in [facility]  Date 07/08/16  Educator rn  Instruction Review Code (Retired) 2- meets goals/outcomes       Oxygen Use Group instruction provided by PowerPoint, verbal discussion, and written material to discuss how supplemental oxygen is prescribed and different types of  oxygen supply systems. Resources for more information are provided.    Breathing Techniques Group instruction that is supported by demonstration and informational handouts. Instructor discusses the benefits of pursed lip and diaphragmatic breathing and detailed demonstration on how to perform both.  Flowsheet Row PULMONARY REHAB CHRONIC OBSTRUCTIVE PULMONARY DISEASE from 04/07/2023 in [facility]  Date 04/07/23  Educator RN  Instruction Review Code 1- Verbalizes Understanding        Risk Factor Reduction Group instruction that is supported by a PowerPoint presentation. Instructor discusses the definition of a risk factor, different risk factors  for pulmonary disease, and how the heart and lungs work together.   MD Day A group question and answer session with a medical doctor that allows participants to ask questions that relate to their pulmonary disease state.   Nutrition for the Pulmonary Patient Group instruction provided by PowerPoint slides, verbal discussion, and written materials to support subject matter. The instructor gives an explanation and review of healthy diet recommendations, which includes a discussion on weight management, recommendations for fruit and vegetable consumption, as well as protein, fluid, caffeine, fiber, sodium, sugar, and alcohol. Tips for eating when patients are short of breath are discussed.    Other Education Group or individual verbal, written, or video instructions that support the educational goals of the pulmonary rehab program.    Knowledge Questionnaire Score:  Knowledge Questionnaire Score - 02/23/23 1140       Knowledge Questionnaire Score   Pre Score 16/18             Core Components/Risk Factors/Patient Goals at Admission:  Personal Goals and Risk Factors at Admission - 02/23/23 1129       Core Components/Risk Factors/Patient Goals on Admission    Weight Management  Weight Loss    Improve shortness of breath with ADL's Yes    Intervention Provide education, individualized exercise plan and daily activity instruction to help decrease symptoms of SOB with activities of daily living.    Expected Outcomes Short Term: Improve cardiorespiratory fitness to achieve a reduction of symptoms when performing ADLs;Long Term: Be able to perform more ADLs without symptoms or delay the onset of symptoms             Core Components/Risk Factors/Patient Goals Review:   Goals and Risk Factor Review     Row Name 03/11/23 1055 04/11/23 1558           Core Components/Risk Factors/Patient Goals Review   Personal Goals Review Weight Management/Obesity;Improve shortness of breath with ADL's;Develop more efficient breathing techniques such as purse lipped breathing and diaphragmatic breathing and practicing self-pacing with activity. Weight Management/Obesity;Improve shortness of breath with ADL's;Develop more efficient breathing techniques such as purse lipped breathing and diaphragmatic breathing and practicing self-pacing with activity.      Review Goals not met. Mahreen has attended 1 class. Goal not met for weight loss. Avaeh is working with our dietician, but her weight is up from starting the program. Goal in progress on developing more efficient breathing techniques such as purse lipped breathing and diaphragmatic breathing; and practicing self-pacing with activity. Goal in progress on improving her shortness of breath with ADLs. Virgia is maintaining her oxygen saturation of 88% or higher on room air with exertion.      Expected Outcomes See admission goals See admission goals               Core Components/Risk Factors/Patient Goals at Discharge (Final Review):   Goals and Risk Factor Review - 04/11/23 1558       Core Components/Risk Factors/Patient Goals Review   Personal Goals Review Weight Management/Obesity;Improve shortness of breath with ADL's;Develop  more efficient breathing techniques such as purse lipped breathing and diaphragmatic breathing and practicing self-pacing with activity.    Review Goal not met for weight loss. Taide is working with our dietician, but her weight is up from starting the program. Goal in progress on developing more efficient breathing techniques such as purse lipped breathing and diaphragmatic breathing; and practicing self-pacing with activity. Goal in progress on improving her shortness of breath  with ADLs. Eldon is maintaining her oxygen saturation of 88% or higher on room air with exertion.    Expected Outcomes See admission goals             ITP Comments:Pt is making expected progress toward Pulmonary Rehab goals after completing 9 sessions. Recommend continued exercise, life style modification, education, and utilization of breathing techniques to increase stamina and strength, while also decreasing shortness of breath with exertion.  Dr. Mechele Collin is Medical Director for Pulmonary Rehab at [facility].     Comments: Dr. Mechele Collin is Medical Director for Pulmonary Rehab at [facility].

## 2023-04-14 ENCOUNTER — Encounter (HOSPITAL_COMMUNITY): Payer: Medicare HMO

## 2023-04-19 ENCOUNTER — Encounter (HOSPITAL_COMMUNITY)
Admission: RE | Admit: 2023-04-19 | Discharge: 2023-04-19 | Disposition: A | Discharge status: Home / Self Care | Payer: Medicare HMO | Source: Ambulatory Visit | Attending: Pulmonary Disease | Admitting: Pulmonary Disease

## 2023-04-19 DIAGNOSIS — J449 Chronic obstructive pulmonary disease, unspecified: Secondary | ICD-10-CM

## 2023-04-19 NOTE — Progress Notes (Signed)
Daily Session Note  Patient Details  Name: [name] MRN: [id number] Date of Birth: 1940/12/25 Referring Provider:   [name] Pulmonary Rehab Walk Test from 02/23/2023 in [facility]  Referring Provider [name]       Encounter Date: 04/19/2023  Check In:  Session Check In - 04/19/23 1421       Check-In   Supervising physician immediately available to respond to emergencies [facility] MD immediately available    Physician(s) Jari Favre, PA    Location [facility]    Staff Present [name], MS, ACSM-CEP, Exercise Physiologist;Randi Dionisio Paschal, ACSM-CEP, Exercise Physiologist;Samantha Belarus, RD, Dutch Gray, RN, Fuller Plan, RT    Virtual Visit No    Medication changes reported     No    Fall or balance concerns reported    No    Tobacco Cessation No Change    Warm-up and Cool-down Performed as group-led instruction    Resistance Training Performed Yes    VAD Patient? No    PAD/SET Patient? No      Pain Assessment   Currently in Pain? No/denies    Pain Score 0-No pain    Multiple Pain Sites No             Capillary Blood Glucose: No results found for this or any previous visit (from the past 24 hour(s)).    Social History   Tobacco Use  Smoking Status Former   Current packs/day: 0.00   Average packs/day: 1 pack/day for 56.0 years (56.0 ttl pk-yrs)   Types: Cigarettes   Start date: 06/22/1959   Quit date: 06/22/2015   Years since quitting: 7.8  Smokeless Tobacco Never    Goals Met:  Proper associated with RPD/PD & O2 Sat Independence with exercise equipment Exercise tolerated well No report of concerns or symptoms today Strength training completed today  Goals Unmet:  Not Applicable  Comments: Service time is from 1330 to 1446.    Dr. Mechele Collin is Medical Director for Pulmonary Rehab at [facility].

## 2023-04-21 ENCOUNTER — Encounter (HOSPITAL_COMMUNITY)
Admission: RE | Admit: 2023-04-21 | Discharge: 2023-04-21 | Disposition: A | Discharge status: Home / Self Care | Payer: Medicare HMO | Source: Ambulatory Visit | Attending: Pulmonary Disease | Admitting: Pulmonary Disease

## 2023-04-21 VITALS — Wt 160.7 lb

## 2023-04-21 DIAGNOSIS — J449 Chronic obstructive pulmonary disease, unspecified: Secondary | ICD-10-CM

## 2023-04-21 NOTE — Progress Notes (Signed)
Daily Session Note  Patient Details  Name: DENEE LUNDVALL MRN: [id number] Date of Birth: April 13, 1941 Referring Provider:   [name] Pulmonary Rehab Walk Test from 02/23/2023 in [facility]  Referring Provider [name]       Encounter Date: 04/21/2023  Check In:  Session Check In - 04/21/23 1350       Check-In   Supervising physician immediately available to respond to emergencies [facility] MD immediately available    Physician(s) Edd Fabian, NP    Location [facility]    Staff Present [name], MS, ACSM-CEP, Exercise Physiologist;Randi Dionisio Paschal, ACSM-CEP, Exercise Physiologist;Samantha Belarus, RD, Dutch Gray, RN, Fuller Plan, RT    Virtual Visit No    Medication changes reported     No    Fall or balance concerns reported    No    Tobacco Cessation No Change    Warm-up and Cool-down Performed as group-led instruction    Resistance Training Performed Yes    VAD Patient? No    PAD/SET Patient? No      Pain Assessment   Currently in Pain? No/denies    Multiple Pain Sites No             Capillary Blood Glucose: No results found for this or any previous visit (from the past 24 hour(s)).    Social History   Tobacco Use  Smoking Status Former   Current packs/day: 0.00   Average packs/day: 1 pack/day for 56.0 years (56.0 ttl pk-yrs)   Types: Cigarettes   Start date: 06/22/1959   Quit date: 06/22/2015   Years since quitting: 7.8  Smokeless Tobacco Never    Goals Met:  Proper associated with RPD/PD & O2 Sat Independence with exercise equipment Exercise tolerated well No report of concerns or symptoms today Strength training completed today  Goals Unmet:  Not Applicable  Comments: Service time is from 1318 to 1445.     Dr. Mechele Collin is Medical Director for Pulmonary Rehab at [facility].

## 2023-04-22 ENCOUNTER — Encounter (HOSPITAL_COMMUNITY): Payer: Self-pay | Admitting: *Deleted

## 2023-04-22 ENCOUNTER — Other Ambulatory Visit: Payer: Self-pay

## 2023-04-22 ENCOUNTER — Ambulatory Visit (HOSPITAL_COMMUNITY)
Admission: EM | Admit: 2023-04-22 | Discharge: 2023-04-22 | Disposition: A | Discharge status: Home / Self Care | Payer: Medicare HMO | Attending: Emergency Medicine | Admitting: Emergency Medicine

## 2023-04-22 DIAGNOSIS — U071 COVID-19: Secondary | ICD-10-CM | POA: Diagnosis not present

## 2023-04-22 DIAGNOSIS — J441 Chronic obstructive pulmonary disease with (acute) exacerbation: Secondary | ICD-10-CM

## 2023-04-22 MED ORDER — IPRATROPIUM-ALBUTEROL 0.5-2.5 (3) MG/3ML IN SOLN
3.0000 mL | Freq: Once | RESPIRATORY_TRACT | Status: AC
  Administered 2023-04-22: 3 mL via RESPIRATORY_TRACT

## 2023-04-22 MED ORDER — IPRATROPIUM-ALBUTEROL 0.5-2.5 (3) MG/3ML IN SOLN
RESPIRATORY_TRACT | Status: AC
  Filled 2023-04-22: qty 3

## 2023-04-22 MED ORDER — MOLNUPIRAVIR EUA 200MG CAPSULE: 4.0000 | ORAL_CAPSULE | Freq: Two times a day (BID) | ORAL | 0 refills | Status: AC

## 2023-04-22 MED ORDER — PREDNISONE 20 MG PO TABS: 40.0000 mg | ORAL_TABLET | Freq: Every day | ORAL | 0 refills | Status: AC

## 2023-04-22 MED ORDER — METHYLPREDNISOLONE SODIUM SUCC 125 MG IJ SOLR
INTRAMUSCULAR | Status: AC
  Filled 2023-04-22: qty 2

## 2023-04-22 MED ORDER — METHYLPREDNISOLONE SODIUM SUCC 125 MG IJ SOLR
60.0000 mg | Freq: Once | INTRAMUSCULAR | Status: AC
  Administered 2023-04-22: 60 mg via INTRAMUSCULAR

## 2023-04-22 MED ORDER — DEXAMETHASONE SODIUM PHOSPHATE 10 MG/ML IJ SOLN: 10.0000 mg | Freq: Once | INTRAMUSCULAR | Status: DC

## 2023-04-22 NOTE — ED Provider Notes (Signed)
[facility]    CSN: [id number] Arrival date & time: 04/22/23  1855      History   Chief Complaint Chief Complaint  Patient presents with   Shortness of Breath   Nasal Congestion   Cough   Sore Throat    HPI [name] is a 82 y.o. female.   Patient presents to clinic for shortness of breath, nasal congestion, cough and sore throat that started today.  She did test positive for COVID-19 on a home test earlier this afternoon.  She arrives mainly for shortness of breath.  She does have a history of asthma and COPD.  She is tachycardic with a fever and oxygen of 91%, audible wheezing.  The history is provided by the patient and medical records.  Shortness of Breath Associated symptoms: cough, fever and sore throat   Associated symptoms: no chest pain   Cough Associated symptoms: fever, shortness of breath and sore throat   Associated symptoms: no chest pain   Sore Throat Associated symptoms include shortness of breath. Pertinent negatives include no chest pain.    Past Medical History:  Diagnosis Date   Arthritis    Asthma    COPD (chronic obstructive pulmonary disease) (HCC)    Depression    Diabetes mellitus without complication (HCC)    Diverticulitis    GERD (gastroesophageal reflux disease)    Hyperlipidemia    Hypertension 06/15/2016   left lung ca dx'd 2016   Thyroid nodule     Patient Active Problem List   Diagnosis Date Noted   Hypertensive nephropathy 05/14/2022   Aortic atherosclerosis (HCC) 05/14/2022   Diabetes mellitus with stage 3 chronic kidney disease (HCC) 05/14/2022   Constipation 02/16/2021   Diverticular disease of colon 02/16/2021   Imaging of gastrointestinal tract abnormal 02/16/2021   Left lower quadrant pain 02/16/2021   Epistaxis, recurrent 09/12/2020   Nasal septal perforation 09/12/2020   Insomnia 08/24/2018   Prediabetes 05/27/2018   Hypertension 06/15/2016   Adenocarcinoma of left lung (HCC) 07/04/2015    Multinodular goiter (nontoxic) 07/18/2014   Dyspnea 05/23/2014   Mixed hyperlipidemia 05/23/2014   Rhinitis, allergic 11/22/2013   COPD (chronic obstructive pulmonary disease) (HCC) 12/15/2012    Past Surgical History:  Procedure Laterality Date   BIOPSY THYROID     CATARACT EXTRACTION     left eye   CATARACT EXTRACTION W/ INTRAOCULAR LENS IMPLANT     RIGHT   CESAREAN SECTION     EYE SURGERY     MASS EXCISION N/A 12/13/2014   Procedure: EXCISION OF VOCAL CORD POLYPS;  Surgeon: Osborn Coho, MD;  Location: [facility] OR;  Service: ENT;  Laterality: N/A;   MICROLARYNGOSCOPY N/A 12/13/2014   Procedure: MICROLARYNGOSCOPY;  Surgeon: Osborn Coho, MD;  Location: [facility] OR;  Service: ENT;  Laterality: N/A;   TUBAL LIGATION     VIDEO ASSISTED THORACOSCOPY (VATS)/ LOBECTOMY Left 07/04/2015   Procedure: VIDEO ASSISTED THORACOSCOPY (VATS) LEFT UPPER LOBECTOMY;  Surgeon: Loreli Slot, MD;  Location: [facility] OR;  Service: Thoracic;  Laterality: Left;   VIDEO BRONCHOSCOPY WITH INSERTION OF INTERBRONCHIAL VALVE (IBV) N/A 07/11/2015   Procedure: VIDEO BRONCHOSCOPY WITH INSERTION OF INTERBRONCHIAL VALVE (IBV);  Surgeon: Loreli Slot, MD;  Location: [facility] OR;  Service: Thoracic;  Laterality: N/A;   VIDEO BRONCHOSCOPY WITH INSERTION OF INTERBRONCHIAL VALVE (IBV) N/A 09/10/2015   Procedure: VIDEO BRONCHOSCOPY WITH REMOVAL OF INTERBRONCHIAL VALVES;  Surgeon: Loreli Slot, MD;  Location: [facility] OR;  Service: Thoracic;  Laterality: N/A;  OB History   No obstetric history on file.      Home Medications    Prior to Admission medications   Medication Sig Start Date End Date Taking? Authorizing Provider  albuterol (PROVENTIL) (2.5 MG/3ML) 0.083% nebulizer solution Take 3 mLs (2.5 mg total) by nebulization every 6 (six) hours as needed for wheezing or shortness of breath. 03/08/22  Yes Parrett, Tammy S, NP  albuterol (VENTOLIN HFA) 108 (90 Base) MCG/ACT inhaler INHALE 2 PUFFS BY MOUTH EVERY 6 HOURS AS  NEEDED FOR WHEEZE OR SHORTNESS OF BREATH 04/11/23  Yes Oretha Milch, MD  aspirin 81 MG tablet Take 81 mg by mouth every evening.    Yes [provider]  BIOTIN 5000 PO Take 1 capsule by mouth daily at 2 am. Takes around [facility]   Yes [provider]  Blood Glucose Monitoring Suppl (TRUE METRIX AIR GLUCOSE METER) w/Device KIT Use to check blood sugar 4 times a day. Dx code e11.65 02/12/21  Yes Arnette Felts, FNP  Calcium Carbonate-Vitamin D 600-400 MG-UNIT tablet Take 1 tablet by mouth daily. Patient taking differently: Take 1 tablet by mouth daily. 600 mg of calcium and 500 units of vitd 02/22/19  Yes Dorothyann Peng, MD  cycloSPORINE (RESTASIS) 0.05 % ophthalmic emulsion 1 drop 2 (two) times daily.   Yes [provider]  donepezil (ARICEPT) 10 MG tablet TAKE 1 TABLET BY MOUTH EVERY DAY IN THE EVENING 04/13/23  Yes Arnette Felts, FNP  empagliflozin (JARDIANCE) 10 MG TABS tablet Take 1 tablet (10 mg total) by mouth daily. 04/04/23  Yes Arnette Felts, FNP  ezetimibe (ZETIA) 10 MG tablet Take 1 tablet (10 mg total) by mouth daily. 04/04/23  Yes Arnette Felts, FNP  flunisolide (NASALIDE) 25 MCG/ACT (0.025%) SOLN PLACE 2 SPRAYS INTO THE NOSE 2 (TWO) TIMES DAILY. 08/18/20  Yes Arnette Felts, FNP  KLOR-CON M20 20 MEQ tablet TAKE 1 TABLET BY MOUTH EVERY DAY WITH FOOD 11/15/22  Yes Arnette Felts, FNP  lubiprostone (AMITIZA) 8 MCG capsule TAKE 1 CAPSULE (8 MCG TOTAL) BY MOUTH DAILY WITH BREAKFAST. 04/13/23  Yes Arnette Felts, FNP  magnesium oxide (MAG-OX) 400 MG tablet Take 400 mg by mouth daily.   Yes [provider]  Melatonin 2.5 MG CAPS Take 2.5 mg by mouth at bedtime as needed (sleep).    Yes [provider]  mirtazapine (REMERON) 15 MG tablet Take 1 tablet (15 mg total) by mouth at bedtime. 04/04/23  Yes Arnette Felts, FNP  molnupiravir EUA (LAGEVRIO) 200 mg CAPS capsule Take 4 capsules (800 mg total) by mouth 2 (two) times daily for 5 days. 04/22/23 04/27/23 Yes Rinaldo Ratel,  [place] N, FNP  Multiple Vitamin (MULTIVITAMIN) tablet Take 1 tablet by mouth daily.   Yes [provider]  niacin (VITAMIN B3) 500 MG ER tablet TAKE 1 TABLET BY MOUTH EVERY DAY IN THE EVENING 02/01/23  Yes Arnette Felts, FNP  OVER THE COUNTER MEDICATION Take 1 tablet by mouth daily.   Yes [provider]  pantoprazole (PROTONIX) 40 MG tablet TAKE 1 TABLET BY MOUTH TWICE A DAY 02/28/23  Yes Arnette Felts, FNP  predniSONE (DELTASONE) 20 MG tablet Take 2 tablets (40 mg total) by mouth daily for 5 days. 04/22/23 04/27/23 Yes Rinaldo Ratel, [place] N, FNP  Probiotic Product (PROBIOTIC PO) Take by mouth.   Yes [provider]  rosuvastatin (CRESTOR) 10 MG tablet TAKE 1 TABLET BY MOUTH EVERY DAY 03/31/23  Yes Arnette Felts, FNP  Semaglutide (RYBELSUS) 7 MG TABS Take 1 tablet (  7 mg total) by mouth daily. 01/04/23  Yes Arnette Felts, FNP  TRELEGY ELLIPTA 100-62.5-25 MCG/ACT AEPB INHALE 1 PUFF ONE TIME DAILY 08/16/22  Yes Oretha Milch, MD  TRUE METRIX BLOOD GLUCOSE TEST test strip USE AS INSTRUCTED TO CHECK BLOOD SUGAR TWICE DAILY 03/31/23  Yes Arnette Felts, FNP  TRUEplus Lancets 33G MISC TEST BLOOD SUGAR AS DIRECTED 12/13/22  Yes Arnette Felts, FNP  valACYclovir (VALTREX) 500 MG tablet Take 1 tablet (500 mg total) by mouth daily. 04/04/23  Yes Arnette Felts, FNP  White Petrolatum-Mineral Oil (SYSTANE NIGHTTIME OP) Place 1 drop into both eyes at bedtime as needed (for dry eyes).    Yes [provider]    Family History Family History  Problem Relation Age of Onset   Renal cancer Brother    Diabetes Mother    Hypertension Mother    Renal Disease Mother    Healthy Father     Social History Social History   Tobacco Use   Smoking status: Former    Current packs/day: 0.00    Average packs/day: 1 pack/day for 56.0 years (56.0 ttl pk-yrs)    Types: Cigarettes    Start date: 06/22/1959    Quit date: 06/22/2015    Years since quitting: 7.8   Smokeless tobacco: Never   Vaping Use   Vaping status: Never Used  Substance Use Topics   Alcohol use: No    Alcohol/week: 0.0 standard drinks of alcohol   Drug use: No     Allergies   Patient has no known allergies.   Review of Systems Review of Systems  Constitutional:  Positive for fatigue and fever.  HENT:  Positive for congestion and sore throat.   Respiratory:  Positive for cough and shortness of breath.   Cardiovascular:  Negative for chest pain.     Physical Exam Triage Vital Signs ED Triage Vitals  Encounter Vitals Group     BP --      Systolic BP Percentile --      Diastolic BP Percentile --      Pulse Rate 04/22/23 1935 (!) 114     Resp 04/22/23 1935 (!) 22     Temp 04/22/23 1937 (!) 101.1 F (38.4 C)     Temp src --      SpO2 04/22/23 1935 91 %     Weight --      Height --      Head Circumference --      Peak Flow --      Pain Score 04/22/23 1938 0     Pain Loc --      Pain Education --      Exclude from Growth Chart --    No data found.  Updated Vital Signs BP 116/80   Pulse (!) 107   Temp (!) 101.1 F (38.4 C)   Resp 20   SpO2 94%   Visual Acuity Right Eye Distance:   Left Eye Distance:   Bilateral Distance:    Right Eye Near:   Left Eye Near:    Bilateral Near:     Physical Exam Vitals and nursing note reviewed.  Constitutional:      Appearance: She is well-developed. She is ill-appearing.  HENT:     Head: Normocephalic and atraumatic.     Mouth/Throat:     Mouth: Mucous membranes are moist.  Cardiovascular:     Rate and Rhythm: Regular rhythm. Tachycardia present.  Pulmonary:     Effort: Tachypnea and  accessory muscle usage present.     Breath sounds: Decreased breath sounds and wheezing present.  Musculoskeletal:        General: Normal range of motion.  Skin:    General: Skin is warm and dry.  Neurological:     General: No focal deficit present.     Mental Status: She is alert and oriented to person, place, and time.  Psychiatric:         Mood and Affect: Mood normal.        Behavior: Behavior normal.      UC Treatments / Results  Labs (all labs ordered are listed, but only abnormal results are displayed) Labs Reviewed - No data to display  EKG   Radiology No results found.  Procedures Procedures (including critical care time)  Medications Ordered in UC Medications  methylPREDNISolone sodium succinate (SOLU-MEDROL) 125 mg/2 mL injection 60 mg (has no administration in time range)  ipratropium-albuterol (DUONEB) 0.5-2.5 (3) MG/3ML nebulizer solution 3 mL (3 mLs Nebulization Given 04/22/23 1947)    Initial Impression / Assessment and Plan / UC Course  I have reviewed the triage vital signs and the nursing notes.  Pertinent labs & imaging results that were available during my care of the patient were reviewed by me and considered in my medical decision making (see chart for details).  Vitals and triage reviewed, patient is febrile, tachycardic and tachypneic and borderline hypoxic initially on presentation.  Wheezing has improved after a DuoNeb, as well as oxygenation.  Believe slight tachycardia is due to fever.  Will start on molnupiravir due to diminished renal function that was taken over 6 months ago, and no medication interactions on molnupiravir.  Steroid burst started as well as IM steroids in clinic.  Advised neb treatments.    Strict emergency precautions given, patient verbalized understanding.  No questions at this time.     Final Clinical Impressions(s) / UC Diagnoses   Final diagnoses:  COPD exacerbation (HCC)  COVID-19 virus infection     Discharge Instructions      I believe your COVID-19 infection has triggered a COPD exacerbation.  Please continue to use your nebulizer solutions every 6 hours as needed for wheezing or shortness of breath.  Keep a close eye on your oxygen and if it does not improve past 90 despite breathing treatments, please call 911 or go to the nearest emergency  department.  I am starting you on the antiviral molnupiravir.  You can take the prednisone daily with breakfast starting tomorrow.  If your breathing declines, or you have any changes in condition, do not hesitate to call 911 or go to the closest emergency department.      ED Prescriptions     Medication Sig Dispense Auth. Provider   predniSONE (DELTASONE) 20 MG tablet Take 2 tablets (40 mg total) by mouth daily for 5 days. 10 tablet Rinaldo Ratel, [place] N, FNP   molnupiravir EUA (LAGEVRIO) 200 mg CAPS capsule Take 4 capsules (800 mg total) by mouth 2 (two) times daily for 5 days. 40 capsule Shwanda Soltis, [place] N, [place]      PDMP not reviewed this encounter.   Countess Biebel, [place] N, [place] 04/22/23 2006

## 2023-04-22 NOTE — Discharge Instructions (Signed)
I believe your COVID-19 infection has triggered a COPD exacerbation.  Please continue to use your nebulizer solutions every 6 hours as needed for wheezing or shortness of breath.  Keep a close eye on your oxygen and if it does not improve past 90 despite breathing treatments, please call 911 or go to the nearest emergency department.  I am starting you on the antiviral molnupiravir.  You can take the prednisone daily with breakfast starting tomorrow.  If your breathing declines, or you have any changes in condition, do not hesitate to call 911 or go to the closest emergency department.

## 2023-04-22 NOTE — ED Triage Notes (Signed)
Pt reports she tested positive for COVID this  afternoon. Pt arrived to [facility] with [facility] that increased with ambulation. Pt has a runny nose,cough,sore  throat and fever.

## 2023-04-23 ENCOUNTER — Other Ambulatory Visit: Payer: Self-pay | Admitting: Pulmonary Disease

## 2023-04-26 ENCOUNTER — Encounter (HOSPITAL_COMMUNITY): Payer: Medicare HMO

## 2023-04-26 ENCOUNTER — Encounter (HOSPITAL_COMMUNITY): Payer: Self-pay

## 2023-04-28 ENCOUNTER — Encounter (HOSPITAL_COMMUNITY): Payer: Medicare HMO

## 2023-04-29 ENCOUNTER — Other Ambulatory Visit: Payer: Self-pay | Admitting: Nurse Practitioner

## 2023-04-29 ENCOUNTER — Ambulatory Visit: Payer: Self-pay

## 2023-04-29 NOTE — Patient Instructions (Signed)
Visit Information  Thank you for taking time to visit with me today. Please don't hesitate to contact me if I can be of assistance to you.   Following are the goals we discussed today:   Goals Addressed             This Visit's Progress    To start pulmonary rehab as directed for decreased lung function   On track    Care Coordination Interventions: Provided patient with basic written and verbal COPD education on self care/management/and exacerbation prevention Provided instruction about proper use of medications used for management of COPD including inhalers Mailed patient printed educational materials related to COPD action plan zone and make appointment with provider if in the yellow zone for 48 hours without improvement Provided education about and advised patient to utilize infection prevention strategies to reduce risk of respiratory infection Determined patient is post COPD, she has completed Paxlovid and Prednisone as directed, her oxygen levels at home are ranging from 93-98%, she will resume Pulmonary Rehab next week         Our next appointment is by telephone on 06/29/23 at 2:30 PM  Please call the care guide team at [phone] if you need to cancel or reschedule your appointment.   If you are experiencing a Mental Health or Behavioral Health Crisis or need someone to talk to, please call 1-800-273-TALK (toll free, 24 hour hotline)  The patient verbalized understanding of instructions, educational materials, and care plan provided today and DECLINED offer to receive copy of patient instructions, educational materials, and care plan.   Delsa Sale, RN, BSN, CCM Care Management Coordinator [facility] Care Management  Direct Phone: [phone]

## 2023-04-29 NOTE — Patient Outreach (Signed)
  Care Coordination   Follow Up Visit Note   04/29/2023 Name: [name] MRN: [id number] DOB: 28-Mar-1941  [name] is a 82 y.o. year old female who sees Arnette Felts, FNP for primary care. I spoke with  [name] by phone today.  What matters to the patients health and wellness today?  Patient would like to stay healthy and physically active.     Goals Addressed             This Visit's Progress    To start pulmonary rehab as directed for decreased lung function   On track    Care Coordination Interventions: Provided patient with basic written and verbal COPD education on self care/management/and exacerbation prevention Provided instruction about proper use of medications used for management of COPD including inhalers Mailed patient printed educational materials related to COPD action plan zone and make appointment with provider if in the yellow zone for 48 hours without improvement Provided education about and advised patient to utilize infection prevention strategies to reduce risk of respiratory infection Determined patient is post COPD, she has completed Paxlovid and Prednisone as directed, her oxygen levels at home are ranging from 93-98%, she will resume Pulmonary Rehab next week     Interventions Today    Flowsheet Row Most Recent Value  Chronic Disease   Chronic disease during today's visit Chronic Obstructive Pulmonary Disease (COPD), Other  [s/p COVID 19 virus]  General Interventions   General Interventions Discussed/Reviewed General Interventions Discussed, General Interventions Reviewed, Doctor Visits  Doctor Visits Discussed/Reviewed Doctor Visits Discussed, Doctor Visits Reviewed, Specialist  Exercise Interventions   Exercise Discussed/Reviewed Exercise Discussed, Exercise Reviewed, Physical Activity  Physical Activity Discussed/Reviewed Physical Activity Reviewed, Physical Activity Discussed  [Pulmonary Rehab]  Education Interventions    Education Provided Provided Education  Provided Verbal Education On When to see the doctor, Medication, Exercise  Pharmacy Interventions   Pharmacy Dicussed/Reviewed Pharmacy Topics Reviewed, Pharmacy Topics Discussed, Medications and their functions          SDOH assessments and interventions completed:  No     Care Coordination Interventions:  Yes, provided   Follow up plan: Follow up call scheduled for 06/29/23 @2 :30 PM    Encounter Outcome:  Pt. Visit Completed

## 2023-05-03 ENCOUNTER — Encounter (HOSPITAL_COMMUNITY): Payer: Medicare HMO

## 2023-05-04 ENCOUNTER — Telehealth (HOSPITAL_COMMUNITY): Payer: Self-pay

## 2023-05-04 NOTE — Telephone Encounter (Signed)
LVM for pt checking on her after being diagnosed with COVID and COPD exacerbation. Awaiting call back

## 2023-05-05 ENCOUNTER — Encounter (HOSPITAL_COMMUNITY)
Admission: RE | Admit: 2023-05-05 | Discharge: 2023-05-05 | Disposition: A | Discharge status: Home / Self Care | Payer: Medicare HMO | Source: Ambulatory Visit | Attending: Pulmonary Disease | Admitting: Pulmonary Disease

## 2023-05-05 DIAGNOSIS — J449 Chronic obstructive pulmonary disease, unspecified: Secondary | ICD-10-CM | POA: Diagnosis not present

## 2023-05-05 NOTE — Progress Notes (Signed)
Daily Session Note  Patient Details  Name: [name] MRN: [id number] Date of Birth: [birth year] Referring Provider:   [name] Pulmonary Rehab Walk Test from 02/23/2023 in [facility]  Referring Provider [name]       Encounter Date: 05/05/2023  Check In:  Session Check In - 05/05/23 1358       Check-In   Supervising physician immediately available to respond to emergencies [facility] MD immediately available    Physician(s) Bernadene Person, NP    Location [facility]    Staff Present [name], MS, ACSM-CEP, Exercise Physiologist;Randi Dionisio Paschal, ACSM-CEP, Exercise Physiologist;Casey Thedore Mins, RN, BSN    Virtual Visit No    Medication changes reported     No    Fall or balance concerns reported    No    Tobacco Cessation No Change    Warm-up and Cool-down Performed as group-led instruction    Resistance Training Performed Yes    VAD Patient? No    PAD/SET Patient? No      Pain Assessment   Currently in Pain? No/denies    Pain Score 0-No pain    Multiple Pain Sites No             Capillary Blood Glucose: No results found for this or any previous visit (from the past 24 hour(s)).    Social History   Tobacco Use  Smoking Status Former   Current packs/day: 0.00   Average packs/day: 1 pack/day for 56.0 years (56.0 ttl pk-yrs)   Types: Cigarettes   Start date: 06/22/1959   Quit date: 06/22/2015   Years since quitting: 7.8  Smokeless Tobacco Never    Goals Met:  Proper associated with RPD/PD & O2 Sat Independence with exercise equipment Exercise tolerated well No report of concerns or symptoms today Strength training completed today  Goals Unmet:  Not Applicable  Comments: Service time is from 1318 to 1444.    Dr. Mechele Collin is Medical Director for Pulmonary Rehab at [facility]

## 2023-05-10 ENCOUNTER — Encounter (HOSPITAL_COMMUNITY)
Admission: RE | Admit: 2023-05-10 | Discharge: 2023-05-10 | Disposition: A | Discharge status: Home / Self Care | Payer: Medicare HMO | Source: Ambulatory Visit | Attending: Pulmonary Disease | Admitting: Pulmonary Disease

## 2023-05-10 VITALS — Wt 161.4 lb

## 2023-05-10 DIAGNOSIS — J449 Chronic obstructive pulmonary disease, unspecified: Secondary | ICD-10-CM | POA: Diagnosis not present

## 2023-05-10 DIAGNOSIS — Z87891 Personal history of nicotine dependence: Secondary | ICD-10-CM | POA: Insufficient documentation

## 2023-05-10 NOTE — Progress Notes (Signed)
Daily Session Note  Patient Details  Name: [name] MRN: [id number] Date of Birth: [birth year] Referring Provider:   [name] Pulmonary Rehab Walk Test from 02/23/2023 in [facility]  Referring Provider [name]       Encounter Date: 05/10/2023  Check In:  Session Check In - 05/10/23 1414       Check-In   Supervising physician immediately available to respond to emergencies [facility] MD immediately available    Physician(s) Bernadene Person, NP    [facility]    Staff Present [name], MS, ACSM-CEP, Exercise Physiologist;Randi Dionisio Paschal, ACSM-CEP, Exercise Physiologist;Casey Hermine Messick [place], RD, [place], RN, BSN    Virtual Visit No    Medication changes reported     No    Fall or balance concerns reported    No    Tobacco Cessation No Change    Warm-up and Cool-down Performed as group-led instruction    Resistance Training Performed Yes    VAD Patient? No    PAD/SET Patient? No      Pain Assessment   Currently in Pain? No/denies    Multiple Pain Sites No             Capillary Blood Glucose: No results found for this or any previous visit (from the past 24 hour(s)).   Exercise Prescription Changes - 05/10/23 1500       Response to Exercise   Blood Pressure (Admit) 138/70    Blood Pressure (Exercise) 138/80    Blood Pressure (Exit) 136/70    Heart Rate (Admit) 106 bpm    Heart Rate (Exercise) 124 bpm    Heart Rate (Exit) 112 bpm    Oxygen Saturation (Admit) 93 %    Oxygen Saturation (Exercise) 91 %    Oxygen Saturation (Exit) 94 %    Rating of Perceived Exertion (Exercise) 11    Perceived Dyspnea (Exercise) 1    Duration Continue with 30 min of aerobic exercise without signs/symptoms of physical distress.    Intensity THRR unchanged      Progression   Progression Continue to progress workloads to maintain intensity without signs/symptoms of physical distress.       Resistance Training   Training Prescription Yes    Weight red bands    Reps 10-15    Time 10 Minutes      NuStep   Level 3    Minutes 15    METs 2.1      Track   Laps 10    Minutes 15    METs 2.54             Social History   Tobacco Use  Smoking Status Former   Current packs/day: 0.00   Average packs/day: 1 pack/day for 56.0 years (56.0 ttl pk-yrs)   Types: Cigarettes   Start date: 06/22/1959   Quit date: 06/22/2015   Years since quitting: 7.8  Smokeless Tobacco Never    Goals Met:  Proper associated with RPD/PD & O2 Sat Exercise tolerated well No report of concerns or symptoms today Strength training completed today  Goals Unmet:  Not Applicable  Comments: Service time is from 1325 to 1442.    Dr. Mechele Collin is Medical Director for Pulmonary Rehab at [facility].

## 2023-05-11 NOTE — Progress Notes (Signed)
Pulmonary Individual Treatment Plan  Patient Details  Name: [name] MRN: [id number] Date of Birth: 1941-04-21 Referring Provider:   [name] Pulmonary Rehab Walk Test from 02/23/2023 in [facility]  Referring Provider [name]       Initial Encounter Date:  Flowsheet Row Pulmonary Rehab Walk Test from 02/23/2023 in [facility]  Date 02/23/23       Visit Diagnosis: Stage 2 moderate COPD by GOLD classification (HCC)  Patient's Home Medications on Admission:   Current Outpatient Medications:    albuterol (PROVENTIL) (2.5 MG/3ML) 0.083% nebulizer solution, Take 3 mLs (2.5 mg total) by nebulization every 6 (six) hours as needed for wheezing or shortness of breath., Disp: 75 mL, Rfl: 12   albuterol (VENTOLIN HFA) 108 (90 Base) MCG/ACT inhaler, INHALE 2 PUFFS BY MOUTH EVERY 6 HOURS AS NEEDED FOR WHEEZE OR SHORTNESS OF BREATH NEED SPPT, Disp: 8.5 each, Rfl: 1   aspirin 81 MG tablet, Take 81 mg by mouth every evening. , Disp: , Rfl:    BIOTIN 5000 PO, Take 1 capsule by mouth daily at 2 am. Takes around 2PM, Disp: , Rfl:    Blood Glucose Monitoring Suppl (TRUE METRIX AIR GLUCOSE METER) w/Device KIT, Use to check blood sugar 4 times a day. Dx code e11.65, Disp: 1 kit, Rfl: 2   Calcium Carbonate-Vitamin D 600-400 MG-UNIT tablet, Take 1 tablet by mouth daily. (Patient taking differently: Take 1 tablet by mouth daily. 600 mg of calcium and 500 units of vitd), Disp:  , Rfl:    cycloSPORINE (RESTASIS) 0.05 % ophthalmic emulsion, 1 drop 2 (two) times daily., Disp: , Rfl:    donepezil (ARICEPT) 10 MG tablet, TAKE 1 TABLET BY MOUTH EVERY DAY IN THE EVENING, Disp: 90 tablet, Rfl: 1   empagliflozin (JARDIANCE) 10 MG TABS tablet, Take 1 tablet (10 mg total) by mouth daily., Disp: 30 tablet, Rfl: 2   ezetimibe (ZETIA) 10 MG tablet, Take 1 tablet (10 mg total) by mouth daily., Disp: 90  tablet, Rfl: 2   flunisolide (NASALIDE) 25 MCG/ACT (0.025%) SOLN, PLACE 2 SPRAYS INTO THE NOSE 2 (TWO) TIMES DAILY., Disp: 25 mL, Rfl: 1   KLOR-CON M20 20 MEQ tablet, TAKE 1 TABLET BY MOUTH EVERY DAY WITH FOOD, Disp: 90 tablet, Rfl: 1   lubiprostone (AMITIZA) 8 MCG capsule, TAKE 1 CAPSULE (8 MCG TOTAL) BY MOUTH DAILY WITH BREAKFAST., Disp: 90 capsule, Rfl: 1   magnesium oxide (MAG-OX) 400 MG tablet, Take 400 mg by mouth daily., Disp: , Rfl:    Melatonin 2.5 MG CAPS, Take 2.5 mg by mouth at bedtime as needed (sleep). , Disp: , Rfl:    mirtazapine (REMERON) 15 MG tablet, Take 1 tablet (15 mg total) by mouth at bedtime., Disp: 90 tablet, Rfl: 1   Multiple Vitamin (MULTIVITAMIN) tablet, Take 1 tablet by mouth daily., Disp: , Rfl:    niacin (VITAMIN B3) 500 MG ER tablet, TAKE 1 TABLET BY MOUTH EVERY DAY IN THE EVENING, Disp: 90 tablet, Rfl: 3   OVER THE COUNTER MEDICATION, Take 1 tablet by mouth daily., Disp: , Rfl:    pantoprazole (PROTONIX) 40 MG tablet, TAKE 1 TABLET BY MOUTH TWICE A DAY, Disp: 180 tablet, Rfl: 1   Probiotic Product (PROBIOTIC PO), Take by mouth., Disp: , Rfl:    rosuvastatin (CRESTOR) 10 MG tablet, TAKE 1 TABLET BY MOUTH EVERY DAY, Disp: 90 tablet, Rfl: 1  Semaglutide (RYBELSUS) 7 MG TABS, Take 1 tablet (7 mg total) by mouth daily., Disp: 90 tablet, Rfl: 1   TRELEGY ELLIPTA 100-62.5-25 MCG/ACT AEPB, INHALE 1 PUFF ONE TIME DAILY, Disp: 180 each, Rfl: 3   TRUE METRIX BLOOD GLUCOSE TEST test strip, USE AS INSTRUCTED TO CHECK BLOOD SUGAR TWICE DAILY, Disp: 200 strip, Rfl: 3   TRUEplus Lancets 33G MISC, TEST BLOOD SUGAR AS DIRECTED, Disp: 400 each, Rfl: 3   valACYclovir (VALTREX) 500 MG tablet, Take 1 tablet (500 mg total) by mouth daily., Disp: 90 tablet, Rfl: 0   White Petrolatum-Mineral Oil (SYSTANE NIGHTTIME OP), Place 1 drop into both eyes at bedtime as needed (for dry eyes). , Disp: , Rfl:   Past Medical History: Past Medical History:  Diagnosis Date   Arthritis    Asthma     COPD (chronic obstructive pulmonary disease) (HCC)    Depression    Diabetes mellitus without complication (HCC)    Diverticulitis    GERD (gastroesophageal reflux disease)    Hyperlipidemia    Hypertension 06/15/2016   left lung ca dx'd 2016   Thyroid nodule     Tobacco Use: Social History   Tobacco Use  Smoking Status Former   Current packs/day: 0.00   Average packs/day: 1 pack/day for 56.0 years (56.0 ttl pk-yrs)   Types: Cigarettes   Start date: 06/22/1959   Quit date: 06/22/2015   Years since quitting: 7.8  Smokeless Tobacco Never    Labs: Review Flowsheet  More data exists      Latest Ref Rng & Units 08/24/2021 12/30/2021 05/03/2022 10/18/2022 01/04/2023  Labs for ITP Cardiac and Pulmonary Rehab  Cholestrol 100 - 199 mg/dL 409  811  - 914  782   LDL (calc) 0 - 99 mg/dL 956  213  - 086  44   HDL-C >39 mg/dL 47  45  - 48  50   Trlycerides 0 - 149 mg/dL 578  469  - 629  92   Hemoglobin A1c 4.8 - 5.6 % 6.6  6.9  6.9  6.9  7.0     Details            Capillary Blood Glucose: Lab Results  Component Value Date   GLUCAP 144 (H) 03/15/2023   GLUCAP 160 (H) 03/15/2023   GLUCAP 160 (H) 03/08/2023   GLUCAP 184 (H) 03/08/2023   GLUCAP 104 (H) 02/23/2023    POCT Glucose     Row Name 02/23/23 1120 02/23/23 1230           POCT Blood Glucose   Pre-Exercise 125 mg/dL --      Post-Exercise -- 103 mg/dL               Pulmonary Assessment Scores:  Pulmonary Assessment Scores     Row Name 02/23/23 1142         ADL UCSD   SOB Score total 28       CAT Score   CAT Score 18       mMRC Score   mMRC Score 3             UCSD: Self-administered rating of dyspnea associated with activities of daily living (ADLs) 6-point scale (0 = "not at all" to 5 = "maximal or unable to do because of breathlessness")  Scoring Scores range from 0 to 120.  Minimally important difference is 5 units  CAT: CAT can identify the health impairment of COPD patients and  is better correlated  with disease progression.  CAT has a scoring range of zero to 40. The CAT score is classified into four groups of low (less than 10), medium (10 - 20), high (21-30) and very high (31-40) based on the impact level of disease on health status. A CAT score over 10 suggests significant symptoms.  A worsening CAT score could be explained by an exacerbation, poor medication adherence, poor inhaler technique, or progression of COPD or comorbid conditions.  CAT MCID is 2 points  mMRC: mMRC (Modified Medical Research Council) Dyspnea Scale is used to assess the degree of baseline functional disability in patients of respiratory disease due to dyspnea. No minimal important difference is established. A decrease in score of 1 point or greater is considered a positive change.   Pulmonary Function Assessment:  Pulmonary Function Assessment - 02/23/23 1142       Breath   Bilateral Breath Sounds Wheezes    Shortness of Breath Yes;Limiting activity             Exercise Target Goals: Exercise Program Goal: Individual exercise prescription set using results from initial 6 min walk test and THRR while considering  patient's activity barriers and safety.   Exercise Prescription Goal: Initial exercise prescription builds to 30-45 minutes a day of aerobic activity, 2-3 days per week.  Home exercise guidelines will be given to patient during program as part of exercise prescription that the participant will acknowledge.  Activity Barriers & Risk Stratification:  Activity Barriers & Cardiac Risk Stratification - 02/23/23 1124       Activity Barriers & Cardiac Risk Stratification   Activity Barriers Back Problems;Other (comment);Deconditioning;Muscular Weakness;Shortness of Breath    Comments left shoulder pain             6 Minute Walk:  6 Minute Walk     Row Name 02/23/23 1411         6 Minute Walk   Phase Initial     Distance 885 feet     Walk Time 6 minutes     #  of Rest Breaks 0     MPH 1.68     METS 1.6     RPE 10     Perceived Dyspnea  1     VO2 Peak 5.6     Resting HR 76 bpm     Resting BP 130/62     Resting Oxygen Saturation  97 %     Exercise Oxygen Saturation  during 6 min walk 91 %     Max Ex. HR 103 bpm     Max Ex. BP 146/68     2 Minute Post BP 124/60       Interval HR   1 Minute HR 80     2 Minute HR 93     3 Minute HR 96     4 Minute HR 98     5 Minute HR 100     6 Minute HR 103     2 Minute Post HR 81     Interval Heart Rate? Yes       Interval Oxygen   Interval Oxygen? Yes     Baseline Oxygen Saturation % 97 %     1 Minute Oxygen Saturation % 96 %     1 Minute Liters of Oxygen 0 L     2 Minute Oxygen Saturation % 92 %     2 Minute Liters of Oxygen 0 L     3 Minute  Oxygen Saturation % 92 %     3 Minute Liters of Oxygen 0 L     4 Minute Oxygen Saturation % 91 %     4 Minute Liters of Oxygen 0 L     5 Minute Oxygen Saturation % 92 %     5 Minute Liters of Oxygen 0 L     6 Minute Oxygen Saturation % 92 %     6 Minute Liters of Oxygen 0 L     2 Minute Post Oxygen Saturation % 95 %     2 Minute Post Liters of Oxygen 0 L              Oxygen Initial Assessment:  Oxygen Initial Assessment - 02/23/23 1535       Home Oxygen   Home Oxygen Device None    Sleep Oxygen Prescription None    Home Exercise Oxygen Prescription None    Home Resting Oxygen Prescription None      Initial 6 min Walk   Oxygen Used None      Program Oxygen Prescription   Program Oxygen Prescription None      Intervention   Short Term Goals To learn and exhibit compliance with exercise, home and travel O2 prescription;To learn and understand importance of maintaining oxygen saturations>88%;To learn and understand importance of monitoring SPO2 with pulse oximeter and demonstrate accurate use of the pulse oximeter.;To learn and demonstrate proper pursed lip breathing techniques or other breathing techniques. ;To learn and demonstrate proper  use of respiratory medications    Long  Term Goals Maintenance of O2 saturations>88%;Verbalizes importance of monitoring SPO2 with pulse oximeter and return demonstration;Exhibits proper breathing techniques, such as pursed lip breathing or other method taught during program session;Demonstrates proper use of MDI's;Exhibits compliance with exercise, home  and travel O2 prescription;Compliance with respiratory medication             Oxygen Re-Evaluation:  Oxygen Re-Evaluation     Row Name 03/09/23 1533 04/08/23 0956 05/02/23 1149         Program Oxygen Prescription   Program Oxygen Prescription None None None       Home Oxygen   Home Oxygen Device None None None     Sleep Oxygen Prescription None None None     Home Exercise Oxygen Prescription None None None     Home Resting Oxygen Prescription None None None     Compliance with Home Oxygen Use Yes Yes Yes       Goals/Expected Outcomes   Short Term Goals To learn and exhibit compliance with exercise, home and travel O2 prescription;To learn and understand importance of maintaining oxygen saturations>88%;To learn and demonstrate proper use of respiratory medications;To learn and understand importance of monitoring SPO2 with pulse oximeter and demonstrate accurate use of the pulse oximeter.;To learn and demonstrate proper pursed lip breathing techniques or other breathing techniques.  To learn and exhibit compliance with exercise, home and travel O2 prescription;To learn and understand importance of maintaining oxygen saturations>88%;To learn and demonstrate proper use of respiratory medications;To learn and understand importance of monitoring SPO2 with pulse oximeter and demonstrate accurate use of the pulse oximeter.;To learn and demonstrate proper pursed lip breathing techniques or other breathing techniques.  To learn and exhibit compliance with exercise, home and travel O2 prescription;To learn and understand importance of maintaining  oxygen saturations>88%;To learn and demonstrate proper use of respiratory medications;To learn and understand importance of monitoring SPO2 with pulse oximeter and demonstrate accurate use of  the pulse oximeter.;To learn and demonstrate proper pursed lip breathing techniques or other breathing techniques.      Long  Term Goals Exhibits compliance with exercise, home  and travel O2 prescription;Verbalizes importance of monitoring SPO2 with pulse oximeter and return demonstration;Maintenance of O2 saturations>88%;Exhibits proper breathing techniques, such as pursed lip breathing or other method taught during program session;Compliance with respiratory medication;Demonstrates proper use of MDI's Exhibits compliance with exercise, home  and travel O2 prescription;Verbalizes importance of monitoring SPO2 with pulse oximeter and return demonstration;Maintenance of O2 saturations>88%;Exhibits proper breathing techniques, such as pursed lip breathing or other method taught during program session;Compliance with respiratory medication;Demonstrates proper use of MDI's Exhibits compliance with exercise, home  and travel O2 prescription;Verbalizes importance of monitoring SPO2 with pulse oximeter and return demonstration;Maintenance of O2 saturations>88%;Exhibits proper breathing techniques, such as pursed lip breathing or other method taught during program session;Compliance with respiratory medication;Demonstrates proper use of MDI's     Comments no O2 needs no O2 needs no O2 needs     Goals/Expected Outcomes Compliance and understanding of oxygen saturations monitoring and breathing techniques to decrease shortness of breath. Compliance and understanding of oxygen saturations monitoring and breathing techniques to decrease shortness of breath. Compliance and understanding of oxygen saturations monitoring and breathing techniques to decrease shortness of breath.              Oxygen Discharge (Final Oxygen  Re-Evaluation):  Oxygen Re-Evaluation - 05/02/23 1149       Program Oxygen Prescription   Program Oxygen Prescription None      Home Oxygen   Home Oxygen Device None    Sleep Oxygen Prescription None    Home Exercise Oxygen Prescription None    Home Resting Oxygen Prescription None    Compliance with Home Oxygen Use Yes      Goals/Expected Outcomes   Short Term Goals To learn and exhibit compliance with exercise, home and travel O2 prescription;To learn and understand importance of maintaining oxygen saturations>88%;To learn and demonstrate proper use of respiratory medications;To learn and understand importance of monitoring SPO2 with pulse oximeter and demonstrate accurate use of the pulse oximeter.;To learn and demonstrate proper pursed lip breathing techniques or other breathing techniques.     Long  Term Goals Exhibits compliance with exercise, home  and travel O2 prescription;Verbalizes importance of monitoring SPO2 with pulse oximeter and return demonstration;Maintenance of O2 saturations>88%;Exhibits proper breathing techniques, such as pursed lip breathing or other method taught during program session;Compliance with respiratory medication;Demonstrates proper use of MDI's    Comments no O2 needs    Goals/Expected Outcomes Compliance and understanding of oxygen saturations monitoring and breathing techniques to decrease shortness of breath.             Initial Exercise Prescription:  Initial Exercise Prescription - 02/23/23 1200       Date of Initial Exercise RX and Referring Provider   Date 02/23/23    Referring Provider [name]    Expected Discharge Date 05/19/23      NuStep   Level 1    SPM 60    Minutes 15    METs 2.5      Track   Minutes 15    METs 2.5      Prescription Details   Frequency (times per week) 2    Duration Progress to 30 minutes of continuous aerobic without signs/symptoms of physical distress      Intensity   THRR 40-80% of Max Heartrate  56-111  Ratings of Perceived Exertion 11-13    Perceived Dyspnea 0-4      Progression   Progression Continue progressive overload as per policy without signs/symptoms or physical distress.      Resistance Training   Training Prescription Yes    Weight red bands    Reps 10-15             Perform Capillary Blood Glucose checks as needed.  Exercise Prescription Changes:   Exercise Prescription Changes     Row Name 03/15/23 1500 03/29/23 1500 04/12/23 1500 04/21/23 1505 05/10/23 1500     Response to Exercise   Blood Pressure (Admit) 138/70 142/80 118/64 128/80 138/70   Blood Pressure (Exercise) 142/72 140/72 160/70 -- 138/80   Blood Pressure (Exit) 132/54 124/76 124/66 128/62 136/70   Heart Rate (Admit) 113 bpm 109 bpm 100 bpm 111 bpm 106 bpm   Heart Rate (Exercise) 116 bpm 127 bpm 112 bpm 122 bpm 124 bpm   Heart Rate (Exit) 109 bpm 109 bpm 106 bpm 99 bpm 112 bpm   Oxygen Saturation (Admit) 91 % 94 % 94 % 94 % 93 %   Oxygen Saturation (Exercise) 92 % 93 % 92 % 90 % 91 %   Oxygen Saturation (Exit) 93 % 95 % 93 % 94 % 94 %   Rating of Perceived Exertion (Exercise) 11 11 11 11 11    Perceived Dyspnea (Exercise) 1 1 1 1 1    Duration Continue with 30 min of aerobic exercise without signs/symptoms of physical distress. Continue with 30 min of aerobic exercise without signs/symptoms of physical distress. Continue with 30 min of aerobic exercise without signs/symptoms of physical distress. Continue with 30 min of aerobic exercise without signs/symptoms of physical distress. Continue with 30 min of aerobic exercise without signs/symptoms of physical distress.   Intensity THRR unchanged THRR unchanged THRR unchanged THRR unchanged THRR unchanged     Progression   Progression Continue to progress workloads to maintain intensity without signs/symptoms of physical distress. Continue to progress workloads to maintain intensity without signs/symptoms of physical distress. Continue to progress  workloads to maintain intensity without signs/symptoms of physical distress. Continue to progress workloads to maintain intensity without signs/symptoms of physical distress. Continue to progress workloads to maintain intensity without signs/symptoms of physical distress.     Resistance Training   Training Prescription Yes Yes Yes Yes Yes   Weight red bands red bands red bands red bands red bands   Reps 10-15 10-15 10-15 10-15 10-15   Time 10 Minutes 10 Minutes 10 Minutes 10 Minutes 10 Minutes     NuStep   Level 1 2 3 3 3    Minutes 15 15 15 15 15    METs 1.8 1.9 2 2  2.1     Track   Laps 10 9 9 10 10    Minutes 15 15 15 15 15    METs 2.54 2.38 2.38 2.54 2.54            Exercise Comments:   Exercise Comments     Row Name 03/08/23 1606           Exercise Comments Pt completed first day of group exercise. She walked track 15 min with walker, METs 2.23. Pt then exercised on the recumbent stepper for 15 min, level 1, METs 1.7. Tolerated fair. Performed warm up and cool down with verbal and demonstrative cues. discussed METs                Exercise Goals and Review:  Exercise Goals     Row Name 02/23/23 1125 03/09/23 1530 04/08/23 0950 05/02/23 1142       Exercise Goals   Increase Physical Activity Yes Yes Yes Yes    Intervention Provide advice, education, support and counseling about physical activity/exercise needs.;Develop an individualized exercise prescription for aerobic and resistive training based on initial evaluation findings, risk stratification, comorbidities and participant's personal goals. Provide advice, education, support and counseling about physical activity/exercise needs.;Develop an individualized exercise prescription for aerobic and resistive training based on initial evaluation findings, risk stratification, comorbidities and participant's personal goals. Provide advice, education, support and counseling about physical activity/exercise needs.;Develop  an individualized exercise prescription for aerobic and resistive training based on initial evaluation findings, risk stratification, comorbidities and participant's personal goals. Provide advice, education, support and counseling about physical activity/exercise needs.;Develop an individualized exercise prescription for aerobic and resistive training based on initial evaluation findings, risk stratification, comorbidities and participant's personal goals.    Expected Outcomes Short Term: Attend rehab on a regular basis to increase amount of physical activity.;Long Term: Exercising regularly at least 3-5 days a week.;Long Term: Add in home exercise to make exercise part of routine and to increase amount of physical activity. Short Term: Attend rehab on a regular basis to increase amount of physical activity.;Long Term: Exercising regularly at least 3-5 days a week.;Long Term: Add in home exercise to make exercise part of routine and to increase amount of physical activity. Short Term: Attend rehab on a regular basis to increase amount of physical activity.;Long Term: Exercising regularly at least 3-5 days a week.;Long Term: Add in home exercise to make exercise part of routine and to increase amount of physical activity. Short Term: Attend rehab on a regular basis to increase amount of physical activity.;Long Term: Exercising regularly at least 3-5 days a week.;Long Term: Add in home exercise to make exercise part of routine and to increase amount of physical activity.    Increase Strength and Stamina Yes Yes Yes Yes    Intervention Provide advice, education, support and counseling about physical activity/exercise needs.;Develop an individualized exercise prescription for aerobic and resistive training based on initial evaluation findings, risk stratification, comorbidities and participant's personal goals. Provide advice, education, support and counseling about physical activity/exercise needs.;Develop an  individualized exercise prescription for aerobic and resistive training based on initial evaluation findings, risk stratification, comorbidities and participant's personal goals. Provide advice, education, support and counseling about physical activity/exercise needs.;Develop an individualized exercise prescription for aerobic and resistive training based on initial evaluation findings, risk stratification, comorbidities and participant's personal goals. Provide advice, education, support and counseling about physical activity/exercise needs.;Develop an individualized exercise prescription for aerobic and resistive training based on initial evaluation findings, risk stratification, comorbidities and participant's personal goals.    Expected Outcomes Short Term: Increase workloads from initial exercise prescription for resistance, speed, and METs.;Short Term: Perform resistance training exercises routinely during rehab and add in resistance training at home;Long Term: Improve cardiorespiratory fitness, muscular endurance and strength as measured by increased METs and functional capacity ( ) Short Term: Increase workloads from initial exercise prescription for resistance, speed, and METs.;Short Term: Perform resistance training exercises routinely during rehab and add in resistance training at home;Long Term: Improve cardiorespiratory fitness, muscular endurance and strength as measured by increased METs and functional capacity ( ) Short Term: Increase workloads from initial exercise prescription for resistance, speed, and METs.;Short Term: Perform resistance training exercises routinely during rehab and add in resistance training at home;Long Term: Improve cardiorespiratory fitness, muscular endurance  and strength as measured by increased METs and functional capacity ( ) Short Term: Increase workloads from initial exercise prescription for resistance, speed, and METs.;Short Term: Perform resistance training  exercises routinely during rehab and add in resistance training at home;Long Term: Improve cardiorespiratory fitness, muscular endurance and strength as measured by increased METs and functional capacity ( )    Able to understand and use rate of perceived exertion (RPE) scale Yes Yes Yes Yes    Intervention Provide education and explanation on how to use RPE scale Provide education and explanation on how to use RPE scale Provide education and explanation on how to use RPE scale Provide education and explanation on how to use RPE scale    Expected Outcomes Short Term: Able to use RPE daily in rehab to express subjective intensity level;Long Term:  Able to use RPE to guide intensity level when exercising independently Short Term: Able to use RPE daily in rehab to express subjective intensity level;Long Term:  Able to use RPE to guide intensity level when exercising independently Short Term: Able to use RPE daily in rehab to express subjective intensity level;Long Term:  Able to use RPE to guide intensity level when exercising independently Short Term: Able to use RPE daily in rehab to express subjective intensity level;Long Term:  Able to use RPE to guide intensity level when exercising independently    Able to understand and use Dyspnea scale Yes Yes Yes Yes    Intervention Provide education and explanation on how to use Dyspnea scale Provide education and explanation on how to use Dyspnea scale Provide education and explanation on how to use Dyspnea scale Provide education and explanation on how to use Dyspnea scale    Expected Outcomes Short Term: Able to use Dyspnea scale daily in rehab to express subjective sense of shortness of breath during exertion;Long Term: Able to use Dyspnea scale to guide intensity level when exercising independently Short Term: Able to use Dyspnea scale daily in rehab to express subjective sense of shortness of breath during exertion;Long Term: Able to use Dyspnea scale to guide  intensity level when exercising independently Short Term: Able to use Dyspnea scale daily in rehab to express subjective sense of shortness of breath during exertion;Long Term: Able to use Dyspnea scale to guide intensity level when exercising independently Short Term: Able to use Dyspnea scale daily in rehab to express subjective sense of shortness of breath during exertion;Long Term: Able to use Dyspnea scale to guide intensity level when exercising independently    Knowledge and understanding of Target Heart Rate Range (THRR) Yes Yes Yes Yes    Intervention Provide education and explanation of THRR including how the numbers were predicted and where they are located for reference Provide education and explanation of THRR including how the numbers were predicted and where they are located for reference Provide education and explanation of THRR including how the numbers were predicted and where they are located for reference Provide education and explanation of THRR including how the numbers were predicted and where they are located for reference    Expected Outcomes Short Term: Able to state/look up THRR;Short Term: Able to use daily as guideline for intensity in rehab;Long Term: Able to use THRR to govern intensity when exercising independently Short Term: Able to state/look up THRR;Short Term: Able to use daily as guideline for intensity in rehab;Long Term: Able to use THRR to govern intensity when exercising independently Short Term: Able to state/look up THRR;Short Term: Able to use daily as  guideline for intensity in rehab;Long Term: Able to use THRR to govern intensity when exercising independently Short Term: Able to state/look up THRR;Short Term: Able to use daily as guideline for intensity in rehab;Long Term: Able to use THRR to govern intensity when exercising independently    Understanding of Exercise Prescription Yes Yes Yes Yes    Expected Outcomes Short Term: Able to explain program exercise  prescription;Long Term: Able to explain home exercise prescription to exercise independently Short Term: Able to explain program exercise prescription;Long Term: Able to explain home exercise prescription to exercise independently Short Term: Able to explain program exercise prescription;Long Term: Able to explain home exercise prescription to exercise independently Short Term: Able to explain program exercise prescription;Long Term: Able to explain home exercise prescription to exercise independently             Exercise Goals Re-Evaluation :  Exercise Goals Re-Evaluation     Row Name 03/09/23 1530 04/08/23 0950 05/02/23 1142         Exercise Goal Re-Evaluation   Exercise Goals Review Increase Physical Activity;Able to understand and use Dyspnea scale;Understanding of Exercise Prescription;Increase Strength and Stamina;Knowledge and understanding of Target Heart Rate Range (THRR);Able to understand and use rate of perceived exertion (RPE) scale Increase Physical Activity;Able to understand and use Dyspnea scale;Understanding of Exercise Prescription;Increase Strength and Stamina;Knowledge and understanding of Target Heart Rate Range (THRR);Able to understand and use rate of perceived exertion (RPE) scale Increase Physical Activity;Able to understand and use Dyspnea scale;Understanding of Exercise Prescription;Increase Strength and Stamina;Knowledge and understanding of Target Heart Rate Range (THRR);Able to understand and use rate of perceived exertion (RPE) scale     Comments Pt has completed one day of group exercise. She walked track 15 min with walker, METs 2.23. Pt then exercised on the recumbent stepper for 15 min, level 1, METs 1.7. Tolerated fair. Performed warm up and cool down with verbal and demonstrative cues. Will progress as tolerated. Pt has completed 8 day of group exercise, missing 1 session. She walked track 15 min with walker, METs 2.23. Pt then exercised on the recumbent stepper  for 15 min, level 2, METs 1.9. Tolerating fair, has not began to progress. Performs warm up and cool down with support in front of her. Will progress as tolerated. Pt has completed 11 days of group exercise, missing 5 sessions for illness and various reasons. She is walking the track for 15 min with walker, now 2.54 METs. Pt then is exercising on the recumbent stepper for 15 min, level 3, METs 2.0. Tolerating fair, has progressed minimally. She performs warm up and cool down with support in front of her. Will progress as able.     Expected Outcomes Through exercise at rehab and at home, the patient will decrease shortness of breath with daily activities and feel confident in carrying out an exercise regime at home. Through exercise at rehab and at home, the patient will decrease shortness of breath with daily activities and feel confident in carrying out an exercise regime at home. Through exercise at rehab and at home, the patient will decrease shortness of breath with daily activities and feel confident in carrying out an exercise regime at home.              Discharge Exercise Prescription (Final Exercise Prescription Changes):  Exercise Prescription Changes - 05/10/23 1500       Response to Exercise   Blood Pressure (Admit) 138/70    Blood Pressure (Exercise) 138/80  Blood Pressure (Exit) 136/70    Heart Rate (Admit) 106 bpm    Heart Rate (Exercise) 124 bpm    Heart Rate (Exit) 112 bpm    Oxygen Saturation (Admit) 93 %    Oxygen Saturation (Exercise) 91 %    Oxygen Saturation (Exit) 94 %    Rating of Perceived Exertion (Exercise) 11    Perceived Dyspnea (Exercise) 1    Duration Continue with 30 min of aerobic exercise without signs/symptoms of physical distress.    Intensity THRR unchanged      Progression   Progression Continue to progress workloads to maintain intensity without signs/symptoms of physical distress.      Resistance Training   Training Prescription Yes    Weight  red bands    Reps 10-15    Time 10 Minutes      NuStep   Level 3    Minutes 15    METs 2.1      Track   Laps 10    Minutes 15    METs 2.54             Nutrition:  Target Goals: Understanding of nutrition guidelines, daily intake of sodium 1500mg , cholesterol 200mg , calories 30% from fat and 7% or less from saturated fats, daily to have 5 or more servings of fruits and vegetables.  Biometrics:  Pre Biometrics - 02/23/23 1423       Pre Biometrics   Grip Strength 23 kg              Nutrition Therapy Plan and Nutrition Goals:  Nutrition Therapy & Goals - 05/10/23 1434       Nutrition Therapy   Diet Heart Healthy Diet    Drug/Food Interactions Statins/Certain Fruits      Personal Nutrition Goals   Nutrition Goal Patient to improve diet quality by using the plate method as a guide for meal planning to include lean protein/plant protein, fruits, vegetables, whole grains, nonfat dairy as part of a well-balanced diet.   goal in progress.   Comments Goals in progress. Auri reports she is not taking rybelsus for blood sugar support and she does not check her blood sugars. She reports decreased appetite; however, she is up 1.3# since starting with our program. She reports that she has made many dietary changes including reduced simple sugars/refined carbohydrate snacking during the day and at night. She continues to eat two meal daily rich in protein and fiber. She is drinking one Boost glucose control daily with breakfast (190kcals, 16g protein). Her nephew is a good support. Patient will benefit from participation in pulmonary rehab rehab for nutrition, exercise, and lifestyle modification.      Intervention Plan   Intervention Prescribe, educate and counsel regarding individualized specific dietary modifications aiming towards targeted core components such as weight, hypertension, lipid management, diabetes, heart failure and other comorbidities.;Nutrition handout(s)  given to patient.    Expected Outcomes Short Term Goal: Understand basic principles of dietary content, such as calories, fat, sodium, cholesterol and nutrients.;Long Term Goal: Adherence to prescribed nutrition plan.             Nutrition Assessments:  Nutrition Assessments - 03/15/23 1553       Rate Your Plate Scores   Pre Score 67            MEDIFICTS Score Key: ?70 Need to make dietary changes  40-70 Heart Healthy Diet ? 40 Therapeutic Level Cholesterol Diet  Flowsheet Row PULMONARY REHAB CHRONIC OBSTRUCTIVE PULMONARY  DISEASE from [facility]15/[facility] in [facility]  Picture Your Plate Total Score on Admission 67      Picture Your Plate Scores: <42 Unhealthy dietary pattern with much room for improvement. 41-50 Dietary pattern unlikely to meet recommendations for good health and room for improvement. 51-60 More healthful dietary pattern, with some room for improvement.  >60 Healthy dietary pattern, although there may be some specific behaviors that could be improved.    Nutrition Goals Re-Evaluation:  Nutrition Goals Re-Evaluation     Row Name 03/08/23 1601 04/08/23 1008 05/10/23 1434         Goals   Current Weight 162 lb 7.7 oz (73.7 kg) 161 lb 6 oz (73.2 kg) 161 lb 6 oz (73.2 kg)     Comment AST 41, A1c 7.0, GFR 45, Cr 1.20, lipids improved WNL no new labs; most recent labs AST 41, A1c 7.0, GFR 45, Cr 1.20, lipids improved WNL no new labs; most recent labs AST 41, A1c 7.0, GFR 45, Cr 1.20, lipids improved WNL     Expected Outcome Geralyn is taking rybelsus for blood sugar support; she does not check her blood sugars. Her nephew is a good support. Patient will benefit from participation in pulmonary rehab rehab for nutrition, exercise, and lifestyle modification. Goals in progress. Narita is taking rybelsus for blood sugar support; she does not check her blood sugars. She reports decreased appetite; however, she is up  1.3# since starting with our program. She reports that she has made many dietary changes including reduced simple sugars/refined carbohydrate snacking during the day and at night. She continues to eat two meal daily. She is drinking one Boost glucose control daily with breakfast (190kcals, 16g protein). Her nephew is a good support. Patient will benefit from participation in pulmonary rehab rehab for nutrition, exercise, and lifestyle modification. Goals in progress. Shyne reports she is not taking rybelsus for blood sugar support and she does not check her blood sugars. She reports decreased appetite; however, she is up 1.3# since starting with our program. She has maintained weight over the last 30 days. She reports that she has made many dietary changes including reduced simple sugars/refined carbohydrate snacking during the day and at night. She continues to eat two meal daily rich in protein and fiber. She is drinking one Boost glucose control daily with breakfast (190kcals, 16g protein). Her nephew is a good support. Patient will benefit from participation in pulmonary rehab rehab for nutrition, exercise, and lifestyle modification.              Nutrition Goals Discharge (Final Nutrition Goals Re-Evaluation):  Nutrition Goals Re-Evaluation - 05/10/23 1434       Goals   Current Weight 161 lb 6 oz (73.2 kg)    Comment no new labs; most recent labs AST 41, A1c 7.0, GFR 45, Cr 1.20, lipids improved WNL    Expected Outcome Goals in progress. Dinna reports she is not taking rybelsus for blood sugar support and she does not check her blood sugars. She reports decreased appetite; however, she is up 1.3# since starting with our program. She has maintained weight over the last 30 days. She reports that she has made many dietary changes including reduced simple sugars/refined carbohydrate snacking during the day and at night. She continues to eat two meal daily rich in protein and fiber. She is drinking  one Boost glucose control daily with breakfast (190kcals, 16g protein). Her nephew is a  good support. Patient will benefit from participation in pulmonary rehab rehab for nutrition, exercise, and lifestyle modification.             Psychosocial: Target Goals: Acknowledge presence or absence of significant depression and/or stress, maximize coping skills, provide positive support system. Participant is able to verbalize types and ability to use techniques and skills needed for reducing stress and depression.  Initial Review & Psychosocial Screening:  Initial Psych Review & Screening - 02/23/23 1128       Initial Review   Current issues with History of Depression      Family Dynamics   Good Support System? Yes    Comments Nephew looks out after her, kids leave far away      Barriers   Psychosocial barriers to participate in program There are no identifiable barriers or psychosocial needs.      Screening Interventions   Interventions Encouraged to exercise             Quality of Life Scores:  Scores of 19 and below usually indicate a poorer quality of life in these areas.  A difference of  2-3 points is a clinically meaningful difference.  A difference of 2-3 points in the total score of the Quality of Life Index has been associated with significant improvement in overall quality of life, self-image, physical symptoms, and general health in studies assessing change in quality of life.  PHQ-9: Review Flowsheet  More data exists      02/23/2023 01/25/2023 01/04/2023 10/18/2022 09/30/2022  Depression screen PHQ 2/9  Decreased Interest 0 0 0 0 0  Down, Depressed, Hopeless 0 0 0 0 0  PHQ - 2 Score 0 0 0 0 0  Altered sleeping 0 - - - -  Tired, decreased energy 0 - - - -  Change in appetite 0 - - - -  Feeling bad or failure about yourself  0 - - - -  Trouble concentrating 0 - - - -  Moving slowly or fidgety/restless 0 - - - -  Suicidal thoughts 0 - - - -  PHQ-9 Score 0 - - - -     Details           Interpretation of Total Score  Total Score Depression Severity:  1-4 = Minimal depression, 5-9 = Mild depression, 10-14 = Moderate depression, 15-19 = Moderately severe depression, 20-27 = Severe depression   Psychosocial Evaluation and Intervention:  Psychosocial Evaluation - 02/23/23 1129       Psychosocial Evaluation & Interventions   Interventions Encouraged to exercise with the program and follow exercise prescription    Comments Lomie denies any psychosocial barriers or concerns    Expected Outcomes For Dejane to participate in RP without any psychosocial barriers or concerns    Continue Psychosocial Services  No Follow up required             Psychosocial Re-Evaluation:  Psychosocial Re-Evaluation     Row Name 03/11/23 1053 04/11/23 1555 05/04/23 1320         Psychosocial Re-Evaluation   Current issues with History of Depression History of Depression History of Depression     Comments No changes since starting the program. Has attended 1 class so far. Annmarie states her mental health is stable. She denies any psychosocial barriers or concerns. Melissia states her mental health is stable. She has recently been hospitalized for COPD exacerbation and COVID. She has missed 2 weeks or PR rehab. She stated she is  looking forward to returning to class. Marguriete denies any psychosocial needs currently.     Expected Outcomes For Shabreka to attended PR free of any psychosocial barriers or concerns. For Renny to attended PR free of any psychosocial barriers or concerns. For Rima to continue to attend PR free of any psychosocial barriers or concerns.     Interventions Encouraged to attend Pulmonary Rehabilitation for the exercise Encouraged to attend Pulmonary Rehabilitation for the exercise Encouraged to attend Pulmonary Rehabilitation for the exercise     Continue Psychosocial Services  No Follow up required No Follow up required No Follow up required               Psychosocial Discharge (Final Psychosocial Re-Evaluation):  Psychosocial Re-Evaluation - 05/04/23 1320       Psychosocial Re-Evaluation   Current issues with History of Depression    Comments Monserat states her mental health is stable. She has recently been hospitalized for COPD exacerbation and COVID. She has missed 2 weeks or PR rehab. She stated she is looking forward to returning to class. Kathlean denies any psychosocial needs currently.    Expected Outcomes For Paradise to continue to attend PR free of any psychosocial barriers or concerns.    Interventions Encouraged to attend Pulmonary Rehabilitation for the exercise    Continue Psychosocial Services  No Follow up required             Education: Education Goals: Education classes will be provided on a weekly basis, covering required topics. Participant will state understanding/return demonstration of topics presented.  Learning Barriers/Preferences:   Education Topics: Introduction to Pulmonary Rehab Group instruction provided by PowerPoint, verbal discussion, and written material to support subject matter. Instructor reviews what Pulmonary Rehab is, the purpose of the program, and how patients are referred.     Know Your Numbers Group instruction that is supported by a PowerPoint presentation. Instructor discusses importance of knowing and understanding resting, exercise, and post-exercise oxygen saturation, heart rate, and blood pressure. Oxygen saturation, heart rate, blood pressure, rating of perceived exertion, and dyspnea are reviewed along with a normal range for these values.  Flowsheet Row PULMONARY REHAB CHRONIC OBSTRUCTIVE PULMONARY DISEASE from 03/17/2023 in [facility]  Date 03/17/23  Educator EP  Instruction Review Code 1- Verbalizes Understanding       Exercise for the Pulmonary Patient Group instruction that is supported by a PowerPoint  presentation. Instructor discusses benefits of exercise, core components of exercise, frequency, duration, and intensity of an exercise routine, importance of utilizing pulse oximetry during exercise, safety while exercising, and options of places to exercise outside of rehab.  Flowsheet Row PULMONARY REHAB OTHER RESPIRATORY from 08/12/2016 in [facility], [facility], [facility]  Date 08/05/16  Educator EP  Instruction Review Code (Retired) 2- meets goals/outcomes          MET Level  Group instruction provided by PowerPoint, verbal discussion, and written material to support subject matter. Instructor reviews what METs are and how to increase METs.  Flowsheet Row PULMONARY REHAB CHRONIC OBSTRUCTIVE PULMONARY DISEASE from 05/05/2023 in [facility], [facility], [facility]  Date 05/05/23  Educator Ep  Instruction Review Code 1- Verbalizes Understanding       Pulmonary Medications Verbally interactive group education provided by instructor with focus on inhaled medications and proper administration.   Anatomy and Physiology of the Respiratory System Group instruction provided  by PowerPoint, verbal discussion, and written material to support subject matter. Instructor reviews respiratory cycle and anatomical components of the respiratory system and their functions. Instructor also reviews differences in obstructive and restrictive respiratory diseases with examples of each.    Oxygen Safety Group instruction provided by PowerPoint, verbal discussion, and written material to support subject matter. There is an overview of "What is Oxygen" and "Why do we need it".  Instructor also reviews how to create a safe environment for oxygen use, the importance of using oxygen as prescribed, and the risks of noncompliance. There is a brief discussion on traveling with oxygen and resources the patient may utilize. Flowsheet Row  PULMONARY REHAB OTHER RESPIRATORY from 08/12/2016 in [facility]  Date 07/08/16  Educator rn  Instruction Review Code (Retired) 2- meets goals/outcomes       Oxygen Use Group instruction provided by PowerPoint, verbal discussion, and written material to discuss how supplemental oxygen is prescribed and different types of oxygen supply systems. Resources for more information are provided.    Breathing Techniques Group instruction that is supported by demonstration and informational handouts. Instructor discusses the benefits of pursed lip and diaphragmatic breathing and detailed demonstration on how to perform both.  Flowsheet Row PULMONARY REHAB CHRONIC OBSTRUCTIVE PULMONARY DISEASE from 04/07/2023 in [facility] for Heart, Vascular, [facility] Lung Health  Date 04/07/23  Educator RN  Instruction Review Code 1- Verbalizes Understanding        Risk Factor Reduction Group instruction that is supported by a PowerPoint presentation. Instructor discusses the definition of a risk factor, different risk factors for pulmonary disease, and how the heart and lungs work together.   MD Day A group question and answer session with a medical doctor that allows participants to ask questions that relate to their pulmonary disease state.   Nutrition for the Pulmonary Patient Group instruction provided by PowerPoint slides, verbal discussion, and written materials to support subject matter. The instructor gives an explanation and review of healthy diet recommendations, which includes a discussion on weight management, recommendations for fruit and vegetable consumption, as well as protein, fluid, caffeine, fiber, sodium, sugar, and alcohol. Tips for eating when patients are short of breath are discussed.    Other Education Group or individual verbal, written, or video instructions that support the educational goals of the  pulmonary rehab program. Flowsheet Row PULMONARY REHAB CHRONIC OBSTRUCTIVE PULMONARY DISEASE from 04/21/2023 in [facility], Vascular, & Lung Health  Date 04/21/23  Educator RN  Instruction Review Code 1- Verbalizes Understanding        Knowledge Questionnaire Score:  Knowledge Questionnaire Score - 02/23/23 1140       Knowledge Questionnaire Score   Pre Score 16/18             Core Components/Risk Factors/Patient Goals at Admission:  Personal Goals and Risk Factors at Admission - 02/23/23 1129       Core Components/Risk Factors/Patient Goals on Admission    Weight Management Weight Loss    Improve shortness of breath with ADL's Yes    Intervention Provide education, individualized exercise plan and daily activity instruction to help decrease symptoms of SOB with activities of daily living.    Expected Outcomes Short Term: Improve cardiorespiratory fitness to achieve a reduction of symptoms when performing ADLs;Long Term: Be able to perform more ADLs without symptoms or delay the onset of symptoms  Core Components/Risk Factors/Patient Goals Review:   Goals and Risk Factor Review     Row Name 03/11/23 1055 04/11/23 1558 05/04/23 1345         Core Components/Risk Factors/Patient Goals Review   Personal Goals Review Weight Management/Obesity;Improve shortness of breath with ADL's;Develop more efficient breathing techniques such as purse lipped breathing and diaphragmatic breathing and practicing self-pacing with activity. Weight Management/Obesity;Improve shortness of breath with ADL's;Develop more efficient breathing techniques such as purse lipped breathing and diaphragmatic breathing and practicing self-pacing with activity. Weight Management/Obesity;Improve shortness of breath with ADL's;Develop more efficient breathing techniques such as purse lipped breathing and diaphragmatic breathing and practicing self-pacing with  activity.     Review Goals not met. Saeeda has attended 1 class. Goal not met for weight loss. Odyssey is working with our dietician, but her weight is up from starting the program. Goal in progress on developing more efficient breathing techniques such as purse lipped breathing and diaphragmatic breathing; and practicing self-pacing with activity. Goal in progress on improving her shortness of breath with ADLs. Idy is maintaining her oxygen saturation of 88% or higher on room air with exertion. Goal still in progress weight loss. Nyree is working with our dietician, but her weight is up from the time she started the program. She is still working with our dietician to make lifestyle changes and increase servings of fruits and vegetables. Goal in progress on developing more efficient breathing techniques such as purse lipped breathing and diaphragmatic breathing; and practicing self-pacing with activity. Mennie can self-pace but needs to be prompted to initiate PLB when short of breath. We are still working on this. Goal in progress on improving her shortness of breath with ADLs. Trilba is maintaining her oxygen saturation of 88% or higher on room air with exertion. She has recently been out for the last 2 weeks due to COPD exacerbation and COVID.     Expected Outcomes See admission goals See admission goals For Monda to lose weight, improve her shortness of breath with ADLs and develop more efficient breathing techniques              Core Components/Risk Factors/Patient Goals at Discharge (Final Review):   Goals and Risk Factor Review - 05/04/23 1345       Core Components/Risk Factors/Patient Goals Review   Personal Goals Review Weight Management/Obesity;Improve shortness of breath with ADL's;Develop more efficient breathing techniques such as purse lipped breathing and diaphragmatic breathing and practicing self-pacing with activity.    Review Goal still in progress weight loss. Annabellee is  working with our dietician, but her weight is up from the time she started the program. She is still working with our dietician to make lifestyle changes and increase servings of fruits and vegetables. Goal in progress on developing more efficient breathing techniques such as purse lipped breathing and diaphragmatic breathing; and practicing self-pacing with activity. Marta can self-pace but needs to be prompted to initiate PLB when short of breath. We are still working on this. Goal in progress on improving her shortness of breath with ADLs. Jaqueline is maintaining her oxygen saturation of 88% or higher on room air with exertion. She has recently been out for the last 2 weeks due to COPD exacerbation and COVID.    Expected Outcomes For Johnye to lose weight, improve her shortness of breath with ADLs and develop more efficient breathing techniques             ITP Comments:Pt is making expected progress toward Pulmonary  Rehab goals after completing 13 sessions. Recommend continued exercise, life style modification, education, and utilization of breathing techniques to increase stamina and strength, while also decreasing shortness of breath with exertion.  Dr. Mechele Collin is Medical Director for Pulmonary Rehab at [facility].     Comments: Dr. Mechele Collin is Medical Director for Pulmonary Rehab at [facility].

## 2023-05-12 ENCOUNTER — Encounter (HOSPITAL_COMMUNITY)
Admission: RE | Admit: 2023-05-12 | Discharge: 2023-05-12 | Disposition: A | Discharge status: Home / Self Care | Payer: Medicare HMO | Source: Ambulatory Visit | Attending: Pulmonary Disease | Admitting: Pulmonary Disease

## 2023-05-12 DIAGNOSIS — J449 Chronic obstructive pulmonary disease, unspecified: Secondary | ICD-10-CM | POA: Diagnosis not present

## 2023-05-12 DIAGNOSIS — Z87891 Personal history of nicotine dependence: Secondary | ICD-10-CM | POA: Diagnosis not present

## 2023-05-12 NOTE — Progress Notes (Signed)
Home Exercise Prescription I have reviewed a Home Exercise Prescription with [name]. Pt is currently walking in her house for 15 min most days as well as exercising on an ergometer for 15 min. Encouraged pt to continue to do so. The patient stated that their goals were to keep moving. We reviewed exercise guidelines, target heart rate during exercise, RPE Scale, weather conditions, endpoints for exercise, warmup and cool down. The patient is encouraged to come to me with any questions. I will continue to follow up with the patient to assist them with progression and safety.  Spent 15 min discussing home exercise plan and goals.  Elliott Quade Nickerson, [place], ACSM-CEP 05/12/2023 3:41 PM

## 2023-05-12 NOTE — Progress Notes (Signed)
Daily Session Note  Patient Details  Name: [name] MRN: [id number] Date of Birth: 02/06/1941 Referring Provider:   [name] Pulmonary Rehab Walk Test from 02/23/2023 in [facility] for [facility], Vascular, & Lung [facility]  Referring Provider [name]       Encounter Date: 05/12/2023  Check In:  Session Check In - 05/12/23 1429       Check-In   Supervising physician immediately available to respond to emergencies [facility] MD immediately available    Physician(s) Metta Clines, NP    Location [facility]    Staff Present [name], MS, ACSM-CEP, Exercise Physiologist;Randi Dionisio Paschal, ACSM-CEP, Exercise Physiologist;[name], RN, BSN    Virtual Visit No    Medication changes reported     No    Fall or balance concerns reported    No    Tobacco Cessation No Change    Warm-up and Cool-down Performed as group-led instruction    Resistance Training Performed Yes    VAD Patient? No    PAD/SET Patient? No      Pain Assessment   Currently in Pain? No/denies    Multiple Pain Sites No             Capillary Blood Glucose: No results found for this or any previous visit (from the past 24 hour(s)).    Social History   Tobacco Use  Smoking Status Former   Current packs/day: 0.00   Average packs/day: 1 pack/day for 56.0 years (56.0 ttl pk-yrs)   Types: Cigarettes   Start date: 06/22/1959   Quit date: 06/22/2015   Years since quitting: 7.8  Smokeless Tobacco Never    Goals Met:  Independence with exercise equipment Exercise tolerated well No report of concerns or symptoms today Strength training completed today  Goals Unmet:  Not Applicable  Comments: Service time is from 1324 to 1449    Dr. Mechele Collin is Medical Director for Pulmonary Rehab at [facility].

## 2023-05-17 ENCOUNTER — Ambulatory Visit (INDEPENDENT_AMBULATORY_CARE_PROVIDER_SITE_OTHER): Payer: Medicare HMO | Admitting: Adult Health

## 2023-05-17 ENCOUNTER — Encounter: Payer: Self-pay | Admitting: Adult Health

## 2023-05-17 VITALS — BP 126/68 | HR 102 | Temp 97.8°F | Ht 63.0 in | Wt 160.0 lb

## 2023-05-17 DIAGNOSIS — Z23 Encounter for immunization: Secondary | ICD-10-CM

## 2023-05-17 DIAGNOSIS — Z87891 Personal history of nicotine dependence: Secondary | ICD-10-CM

## 2023-05-17 DIAGNOSIS — R011 Cardiac murmur, unspecified: Secondary | ICD-10-CM | POA: Diagnosis not present

## 2023-05-17 DIAGNOSIS — J432 Centrilobular emphysema: Secondary | ICD-10-CM

## 2023-05-17 DIAGNOSIS — R0609 Other forms of dyspnea: Secondary | ICD-10-CM | POA: Diagnosis not present

## 2023-05-17 DIAGNOSIS — C3492 Malignant neoplasm of unspecified part of left bronchus or lung: Secondary | ICD-10-CM | POA: Diagnosis not present

## 2023-05-17 NOTE — Progress Notes (Signed)
@Patient  ID: [name], female    DOB: [birth year], 82 y.o.   MRN: [id number]  Chief Complaint  Patient presents with   Follow-up    Referring provider: Arnette Felts, FNP  HPI: 82 year old female former smoker followed for COPD and history of lung cancer October 2016 underwent left VATS with left upper lobectomy and mediastinal lymph node dissection, pathology positive for invasive adenocarcinoma, adenocarcinoma involving the visceral pleura in addition to lymphovascular invasion.  Resected margins were negative for malignancy.  Did require endobronchial valves postop for a persistent air leak.   TEST/EVENTS :  05/2022 CT chest >> nodular scarring left lower lobe   05/2021 CT chest >> stable areas of scarring left upper lobe postsurgical,leftlower lobe nodular scarring 12/2017 CT chest >> stable, nodular area of left lung scarring 2016 Pre-op PFT - FEV1 73 %, DLCO 48%   03/2016 FEV1 66%   Spirometry 10/2017 shows an FEV1 at 63% ratio 57 and FVC of 86%.  PFTs 01/2023 moderate airway obstruction, ratio 60, FEV1 55%, TLC 153%/hyperinflation, DLCO 8.1/44%  2 D echo 12/2017 EF 65%, Gr1 DD   05/17/2023 Follow up : COPD, Hx of lung cancer  Presents for a 4 month follow up. Patient is followed for moderate COPD with emphysema. Overall feels breathing is doing the same. Gets winded with heavy activity, prolonged walking and going up an incline.  Going to pulmonary rehab. Does feel it has helped with her activity level and tolerance. More stamina.  Remains on Trelegy daily.  Uses albuterol inhaler once daily .  Had covid 19 infection 3 weeks. Had mild URI symptoms with cough and nasal congestion. Symptoms resolved and she is back to baseline. Seen at urgent care was treated with Molnupiravir and steroids . She is feeling better. No chest pain, orthopnea or edema.     No Known Allergies  Immunization History  Administered Date(s) Administered   Fluad Quad(high Dose 65+) 07/01/2020,  08/17/2021, 05/20/2022   Fluad Trivalent(High Dose 65+) 05/17/2023   Influenza, High Dose Seasonal PF 06/06/2016, 05/01/2017, 05/31/2018, 06/28/2019   Influenza, Quadrivalent, Recombinant, Inj, Pf 06/03/2019   Influenza-Unspecified 05/01/2017   Moderna SARS-COV2 Booster Vaccination 09/10/2020   Moderna Sars-Covid-2 Vaccination 10/19/2019, 11/16/2019   Pneumococcal Conjugate-13 07/17/2014, 06/15/2018   Pneumococcal Polysaccharide-23 09/07/2011   Tdap 01/04/2023    Past Medical History:  Diagnosis Date   Arthritis    Asthma    COPD (chronic obstructive pulmonary disease) (HCC)    Depression    Diabetes mellitus without complication (HCC)    Diverticulitis    GERD (gastroesophageal reflux disease)    Hyperlipidemia    Hypertension 06/15/2016   left lung ca dx'd 2016   Thyroid nodule     Tobacco History: Social History   Tobacco Use  Smoking Status Former   [name] packs/day: 0.00   Average packs/day: 1 pack/day for 56.0 years (56.0 ttl pk-yrs)   Types: Cigarettes   Start date: 06/22/1959   Quit date: 06/22/2015   Years since quitting: 7.9  Smokeless Tobacco Never   Counseling given: Not Answered   Outpatient Medications Prior to Visit  Medication Sig Dispense Refill   albuterol (PROVENTIL) (2.5 MG/3ML) 0.083% nebulizer solution Take 3 mLs (2.5 mg total) by nebulization every 6 (six) hours as needed for wheezing or shortness of breath. 75 mL 12   albuterol (VENTOLIN HFA) 108 (90 Base) MCG/ACT inhaler INHALE 2 PUFFS BY MOUTH EVERY 6 HOURS AS NEEDED FOR WHEEZE OR SHORTNESS OF BREATH NEED SPPT 8.5 each  1   aspirin 81 MG tablet Take 81 mg by mouth every evening.      Blood Glucose Monitoring Suppl (TRUE METRIX AIR GLUCOSE METER) w/Device KIT Use to check blood sugar 4 times a day. Dx code e11.65 1 kit 2   Calcium Carbonate-Vitamin D 600-400 MG-UNIT tablet Take 1 tablet by mouth daily. (Patient taking differently: Take 1 tablet by mouth daily. 600 mg of calcium and 500 units  of vitd)     cycloSPORINE (RESTASIS) 0.05 % ophthalmic emulsion 1 drop 2 (two) times daily.     donepezil (ARICEPT) 10 MG tablet TAKE 1 TABLET BY MOUTH EVERY DAY IN THE EVENING 90 tablet 1   empagliflozin (JARDIANCE) 10 MG TABS tablet Take 1 tablet (10 mg total) by mouth daily. 30 tablet 2   ezetimibe (ZETIA) 10 MG tablet Take 1 tablet (10 mg total) by mouth daily. 90 tablet 2   KLOR-CON M20 20 MEQ tablet TAKE 1 TABLET BY MOUTH EVERY DAY WITH FOOD 90 tablet 1   lubiprostone (AMITIZA) 8 MCG capsule TAKE 1 CAPSULE (8 MCG TOTAL) BY MOUTH DAILY WITH BREAKFAST. 90 capsule 1   magnesium oxide (MAG-OX) 400 MG tablet Take 400 mg by mouth daily.     Melatonin 2.5 MG CAPS Take 2.5 mg by mouth at bedtime as needed (sleep).      mirtazapine (REMERON) 15 MG tablet Take 1 tablet (15 mg total) by mouth at bedtime. 90 tablet 1   Multiple Vitamin (MULTIVITAMIN) tablet Take 1 tablet by mouth daily.     niacin (VITAMIN B3) 500 MG ER tablet TAKE 1 TABLET BY MOUTH EVERY DAY IN THE EVENING 90 tablet 3   OVER THE COUNTER MEDICATION Take 1 tablet by mouth daily.     pantoprazole (PROTONIX) 40 MG tablet TAKE 1 TABLET BY MOUTH TWICE A DAY 180 tablet 1   Probiotic Product (PROBIOTIC PO) Take by mouth.     rosuvastatin (CRESTOR) 10 MG tablet TAKE 1 TABLET BY MOUTH EVERY DAY 90 tablet 1   TRELEGY ELLIPTA 100-62.5-25 MCG/ACT AEPB INHALE 1 PUFF ONE TIME DAILY 180 each 3   TRUE METRIX BLOOD GLUCOSE TEST test strip USE AS INSTRUCTED TO CHECK BLOOD SUGAR TWICE DAILY 200 strip 3   TRUEplus Lancets 33G MISC TEST BLOOD SUGAR AS DIRECTED 400 each 3   valACYclovir (VALTREX) 500 MG tablet Take 1 tablet (500 mg total) by mouth daily. 90 tablet 0   White Petrolatum-Mineral Oil (SYSTANE NIGHTTIME OP) Place 1 drop into both eyes at bedtime as needed (for dry eyes).      BIOTIN 5000 PO Take 1 capsule by mouth daily at 2 am. Takes around 2PM     flunisolide (NASALIDE) 25 MCG/ACT (0.025%) SOLN PLACE 2 SPRAYS INTO THE NOSE 2 (TWO) TIMES  DAILY. 25 mL 1   Semaglutide (RYBELSUS) 7 MG TABS Take 1 tablet (7 mg total) by mouth daily. 90 tablet 1   No facility-administered medications prior to visit.     Review of Systems:   Constitutional:   No  weight loss, night sweats,  Fevers, chills, fatigue, or  lassitude.  HEENT:   No headaches,  Difficulty swallowing,  Tooth/dental problems, or  Sore throat,                No sneezing, itching, ear ache, nasal congestion, post nasal drip,   CV:  No chest pain,  Orthopnea, PND, swelling in lower extremities, anasarca, dizziness, palpitations, syncope.   GI  No heartburn, indigestion, abdominal  pain, nausea, vomiting, diarrhea, change in bowel habits, loss of appetite, bloody stools.   Resp: No shortness of breath with exertion or at rest.  No excess mucus, no productive cough,  No non-productive cough,  No coughing up of blood.  No change in color of mucus.  No wheezing.  No chest wall deformity  Skin: no rash or lesions.  GU: no dysuria, change in color of urine, no urgency or frequency.  No flank pain, no hematuria   MS:  No joint pain or swelling.  No decreased range of motion.  No back pain.    Physical Exam  BP 126/68 (BP Location: Left Arm, Cuff Size: Normal)   Pulse (!) 102   Temp 97.8 F (36.6 C) (Oral)   Ht 5\' 3"  (1.6 m)   Wt 160 lb (72.6 kg)   SpO2 95% Comment: on RA  BMI 28.34 kg/m   GEN: A/Ox3; pleasant , NAD, well nourished    HEENT:  [place]/AT,  EACs-clear, TMs-wnl, NOSE-clear, THROAT-clear, no lesions, no postnasal drip or exudate noted.   NECK:  Supple w/ fair ROM; no JVD; normal carotid impulses w/o bruits; no thyromegaly or nodules palpated; no lymphadenopathy.    RESP  Clear  P & A; w/o, wheezes/ rales/ or rhonchi. no accessory muscle use, no dullness to percussion  CARD:  RRR,Gr 2/6 SM  no peripheral edema, pulses intact, no cyanosis or clubbing.  GI:   Soft & nt; nml bowel sounds; no organomegaly or masses detected.   Musco: Warm bil, no  deformities or joint swelling noted.   Neuro: alert, no focal deficits noted.    Skin: Warm, no lesions or rashes    Lab Results:  CBC    Component Value Date/Time   WBC 6.0 01/04/2023 1203   WBC 7.0 05/28/2022 1148   WBC 8.0 09/25/2018 1301   RBC 5.29 (H) 01/04/2023 1203   RBC 4.75 05/28/2022 1148   HGB 15.7 01/04/2023 1203   HGB 14.1 06/20/2017 1502   HCT 47.7 (H) 01/04/2023 1203   HCT 42.8 06/20/2017 1502   PLT 206 01/04/2023 1203   MCV 90 01/04/2023 1203   MCV 95.7 06/20/2017 1502   MCH 29.7 01/04/2023 1203   MCH 31.4 05/28/2022 1148   MCHC 32.9 01/04/2023 1203   MCHC 33.6 05/28/2022 1148   RDW 14.5 01/04/2023 1203   RDW 15.9 (H) 06/20/2017 1502   LYMPHSABS 3.2 05/28/2022 1148   LYMPHSABS 2.1 06/20/2017 1502   MONOABS 0.6 05/28/2022 1148   MONOABS 0.5 06/20/2017 1502   EOSABS 0.1 05/28/2022 1148   EOSABS 0.1 06/20/2017 1502   BASOSABS 0.0 05/28/2022 1148   BASOSABS 0.0 06/20/2017 1502    BMET    Component Value Date/Time   NA 143 01/04/2023 1203   NA 142 06/20/2017 1502   K 3.9 01/04/2023 1203   K 4.0 06/20/2017 1502   CL 100 01/04/2023 1203   CO2 24 01/04/2023 1203   CO2 29 06/20/2017 1502   GLUCOSE 107 (H) 01/04/2023 1203   GLUCOSE 183 (H) 05/28/2022 1148   GLUCOSE 123 06/20/2017 1502   BUN 17 01/04/2023 1203   BUN 16.0 06/20/2017 1502   CREATININE 1.20 (H) 01/04/2023 1203   CREATININE 1.07 (H) 05/28/2022 1148   CREATININE 1.2 (H) 06/20/2017 1502   CALCIUM 10.0 01/04/2023 1203   CALCIUM 9.8 06/20/2017 1502   GFRNONAA 52 (L) 05/28/2022 1148   GFRAA 67 07/01/2020 1247   GFRAA 44 (L) 05/26/2020 0947    BNP  No results found for: "BNP"  ProBNP No results found for: "PROBNP"  Imaging: No results found.  Administration History     None          Latest Ref Rng & Units 01/12/2023   10:25 AM 06/09/2015    3:44 PM  PFT Results  FVC-Pre L 1.66  2.04   FVC-Predicted Pre % 67  93   FVC-Post L 1.61  2.02   FVC-Predicted Post % 65  92    Pre FEV1/FVC % % 60  60   Post FEV1/FCV % % 62  62   FEV1-Pre L 1.00  1.22   FEV1-Predicted Pre % 55  72   FEV1-Post L 0.99  1.24   DLCO uncorrected ml/min/mmHg 8.14  11.34   DLCO UNC% % 44  48   DLCO corrected ml/min/mmHg 7.65    DLCO COR %Predicted % 42    DLVA Predicted % 80  49   TLC L 7.51  4.77   TLC % Predicted % 153  95   RV % Predicted % 241  111     No results found for: "NITRICOXIDE"      Assessment & Plan:   Heart murmur Heart murmur noted on exam, no associated acute symptoms. Does have chronic dyspnea. Appears euvolemic on exam. Previous echo in 2019 without valvular dz. Repeat echo to evaluate.   COPD (chronic obstructive pulmonary disease) (HCC) Moderate COPD with emphysema - appears stable  Perceived benefit from pulmonary rehab  Continue on triple therapy inhaler   Plan  Patient Instructions  Set up for 2 D Echo -heart murmur.  Continue on TRELEGY daily . Rinse after use.  Albuterol inhaler or Neb As needed   Continue on Zyrtec 10mg  At bedtime As needed    Saline nasal rinses As needed   Continue on Flonase daily As needed   Flu shot today  Activity as tolerated.  Refer to Lung cancer CT screening program.  Follow up with Dr. [name]  In 6 months and  and As needed   Please contact office for sooner follow up if symptoms do not improve or worsen or seek emergency care     Adenocarcinoma of left lung [facility]) Adenocarcinoma on Left s/p VATS with LUL lobectomy 2016 , serial CT chest negative . Last CT 2023 no evidence of recurrence.  Refer to LDCT chest screening program as she qualifies for yearly screening and is done with oncology serial follow up .      Rubye Oaks, NP 05/17/2023

## 2023-05-17 NOTE — Assessment & Plan Note (Signed)
Moderate COPD with emphysema - appears stable  Perceived benefit from pulmonary rehab  Continue on triple therapy inhaler   Plan  Patient Instructions  Set up for 2 D Echo -heart murmur.  Continue on TRELEGY daily . Rinse after use.  Albuterol inhaler or Neb As needed   Continue on Zyrtec 10mg  At bedtime As needed    Saline nasal rinses As needed   Continue on Flonase daily As needed   Flu shot today  Activity as tolerated.  Refer to Lung cancer CT screening program.  Follow up with Dr. [name]  In 6 months and  and As needed   Please contact office for sooner follow up if symptoms do not improve or worsen or seek emergency care

## 2023-05-17 NOTE — Patient Instructions (Addendum)
Set up for 2 D Echo -heart murmur.  Continue on TRELEGY daily . Rinse after use.  Albuterol inhaler or Neb As needed   Continue on Zyrtec 10mg  At bedtime As needed    Saline nasal rinses As needed   Continue on Flonase daily As needed   Flu shot today  Activity as tolerated.  Refer to Lung cancer CT screening program.  Follow up with Dr. [name]  In 6 months and  and As needed   Please contact office for sooner follow up if symptoms do not improve or worsen or seek emergency care

## 2023-05-17 NOTE — Assessment & Plan Note (Signed)
Heart murmur noted on exam, no associated acute symptoms. Does have chronic dyspnea. Appears euvolemic on exam. Previous echo in 2019 without valvular dz. Repeat echo to evaluate.

## 2023-05-17 NOTE — Assessment & Plan Note (Signed)
Adenocarcinoma on Left s/p VATS with LUL lobectomy 2016 , serial CT chest negative . Last CT 2023 no evidence of recurrence.  Refer to LDCT chest screening program as she qualifies for yearly screening and is done with oncology serial follow up .

## 2023-05-19 ENCOUNTER — Encounter (HOSPITAL_COMMUNITY)
Admission: RE | Admit: 2023-05-19 | Discharge: 2023-05-19 | Disposition: A | Discharge status: Home / Self Care | Payer: Medicare HMO | Source: Ambulatory Visit | Attending: Pulmonary Disease | Admitting: Pulmonary Disease

## 2023-05-19 DIAGNOSIS — Z87891 Personal history of nicotine dependence: Secondary | ICD-10-CM | POA: Diagnosis not present

## 2023-05-19 DIAGNOSIS — J449 Chronic obstructive pulmonary disease, unspecified: Secondary | ICD-10-CM

## 2023-05-19 NOTE — Progress Notes (Signed)
Daily Session Note  Patient Details  Name: [name] MRN: [id number] Date of Birth: 05-13-1941 Referring Provider:   [name] Pulmonary Rehab Walk Test from 02/23/2023 in [facility] for Heart, Vascular, & Lung Health  Referring Provider [name]       Encounter Date: 05/19/2023  Check In:  Session Check In - 05/19/23 1334       Check-In   Supervising physician immediately available to respond to emergencies [facility] MD immediately available    Physician(s) Bernadene Person, NP    Location [facility]    Staff Present [name], MS, ACSM-CEP, Exercise Physiologist;Randi Dionisio Paschal, ACSM-CEP, Exercise Physiologist;Cas Tracz Synthia Innocent, RN, BSN    Virtual Visit No    Medication changes reported     No    Fall or balance concerns reported    No    Tobacco Cessation No Change    Warm-up and Cool-down Performed as group-led instruction    Resistance Training Performed Yes    VAD Patient? No    PAD/SET Patient? No      Pain Assessment   Currently in Pain? No/denies    Pain Score 0-No pain    Multiple Pain Sites No             Capillary Blood Glucose: No results found for this or any previous visit (from the past 24 hour(s)).    Social History   Tobacco Use  Smoking Status Former   Current packs/day: 0.00   Average packs/day: 1 pack/day for 56.0 years (56.0 ttl pk-yrs)   Types: Cigarettes   Start date: 06/22/1959   Quit date: 06/22/2015   Years since quitting: 7.9  Smokeless Tobacco Never    Goals Met:  Proper associated with RPD/PD & O2 Sat Independence with exercise equipment Exercise tolerated well No report of concerns or symptoms today Strength training completed today  Goals Unmet:  Not Applicable  Comments: Service time is from 1323 to 1443.    Dr. Mechele Collin is Medical Director for Pulmonary Rehab at [facility].

## 2023-05-24 ENCOUNTER — Encounter (HOSPITAL_COMMUNITY)
Admission: RE | Admit: 2023-05-24 | Discharge: 2023-05-24 | Disposition: A | Discharge status: Home / Self Care | Payer: Medicare HMO | Source: Ambulatory Visit | Attending: Pulmonary Disease

## 2023-05-24 VITALS — Wt 162.7 lb

## 2023-05-24 DIAGNOSIS — J449 Chronic obstructive pulmonary disease, unspecified: Secondary | ICD-10-CM | POA: Diagnosis not present

## 2023-05-24 DIAGNOSIS — Z87891 Personal history of nicotine dependence: Secondary | ICD-10-CM | POA: Diagnosis not present

## 2023-05-24 NOTE — Progress Notes (Signed)
Daily Session Note  Patient Details  Name: [name] MRN: [id number] Date of Birth: September 21, 1940 Referring Provider:   [name] Pulmonary Rehab Walk Test from 02/23/2023 in [facility] for Heart, Vascular, & Lung Health  Referring Provider [name]       Encounter Date: 05/24/2023  Check In:  Session Check In - 05/24/23 1406       Check-In   Supervising physician immediately available to respond to emergencies [facility] MD immediately available    Physician(s) Jari Favre, PA    Location [facility]    Staff Present [name], MS, ACSM-CEP, Exercise Physiologist;Randi Dionisio Paschal, ACSM-CEP, Exercise Physiologist;Anes Rigel Synthia Innocent, RN, BSN    Virtual Visit No    Medication changes reported     No    Fall or balance concerns reported    No    Tobacco Cessation No Change    Warm-up and Cool-down Performed as group-led instruction    Resistance Training Performed Yes    VAD Patient? No      Pain Assessment   Currently in Pain? No/denies    Pain Score 0-No pain             Capillary Blood Glucose: No results found for this or any previous visit (from the past 24 hour(s)).   Exercise Prescription Changes - 05/24/23 1500       Response to Exercise   Blood Pressure (Admit) 142/72    Blood Pressure (Exercise) 150/70    Blood Pressure (Exit) 126/64    Heart Rate (Admit) 110 bpm    Heart Rate (Exercise) 124 bpm    Heart Rate (Exit) 119 bpm    Oxygen Saturation (Admit) 94 %    Oxygen Saturation (Exercise) 91 %    Oxygen Saturation (Exit) 94 %    Rating of Perceived Exertion (Exercise) 11    Perceived Dyspnea (Exercise) 1    Duration Continue with 30 min of aerobic exercise without signs/symptoms of physical distress.    Intensity THRR unchanged      Progression   Progression Continue to progress workloads to maintain intensity without signs/symptoms of physical distress.      Resistance Training   Training  Prescription Yes    Weight blue bands    Reps 10-15    Time 10 Minutes      NuStep   Level 4    Minutes 15    METs 2.2      Track   Laps 9    Minutes 15    METs 2.38             Social History   Tobacco Use  Smoking Status Former   Current packs/day: 0.00   Average packs/day: 1 pack/day for 56.0 years (56.0 ttl pk-yrs)   Types: Cigarettes   Start date: 06/22/1959   Quit date: 06/22/2015   Years since quitting: 7.9  Smokeless Tobacco Never    Goals Met:  Proper associated with RPD/PD & O2 Sat Independence with exercise equipment Exercise tolerated well No report of concerns or symptoms today Strength training completed today  Goals Unmet:  Not Applicable  Comments: Service time is from 1317 to 1438.    Dr. Mechele Collin is Medical Director for Pulmonary Rehab at [facility].

## 2023-05-26 ENCOUNTER — Encounter (HOSPITAL_COMMUNITY)
Admission: RE | Admit: 2023-05-26 | Discharge: 2023-05-26 | Disposition: A | Discharge status: Home / Self Care | Payer: Medicare HMO | Source: Ambulatory Visit | Attending: Pulmonary Disease | Admitting: Pulmonary Disease

## 2023-05-26 DIAGNOSIS — J449 Chronic obstructive pulmonary disease, unspecified: Secondary | ICD-10-CM | POA: Diagnosis not present

## 2023-05-26 DIAGNOSIS — Z87891 Personal history of nicotine dependence: Secondary | ICD-10-CM | POA: Diagnosis not present

## 2023-05-26 NOTE — Progress Notes (Signed)
Daily Session Note  Patient Details  Name: [name] MRN: [id number] Date of Birth: 01/21/1941 Referring Provider:   [name] Pulmonary Rehab Walk Test from 02/23/2023 in [facility]  Referring Provider [name]       Encounter Date: 05/26/2023  Check In:  Session Check In - 05/26/23 1514       Check-In   Supervising physician immediately available to respond to emergencies [facility] MD immediately available    Physician(s) Jari Favre, PA    Location [facility]    Staff Present [name], MS, ACSM-CEP, Exercise Physiologist;Randi Dionisio Paschal, ACSM-CEP, Exercise Physiologist;Briyah Wheelwright Synthia Innocent, RN, BSN    Virtual Visit No    Medication changes reported     No    Fall or balance concerns reported    No    Tobacco Cessation No Change    Warm-up and Cool-down Performed as group-led instruction    Resistance Training Performed Yes    VAD Patient? No    PAD/SET Patient? No      Pain Assessment   Currently in Pain? No/denies    Multiple Pain Sites No             Capillary Blood Glucose: No results found for this or any previous visit (from the past 24 hour(s)).    Social History   Tobacco Use  Smoking Status Former   Current packs/day: 0.00   Average packs/day: 1 pack/day for 56.0 years (56.0 ttl pk-yrs)   Types: Cigarettes   Start date: 06/22/1959   Quit date: 06/22/2015   Years since quitting: 7.9  Smokeless Tobacco Never    Goals Met:  Proper associated with RPD/PD & O2 Sat Independence with exercise equipment Exercise tolerated well No report of concerns or symptoms today Strength training completed today  Goals Unmet:  Not Applicable  Comments: Service time is from 1321 to 1445.    Dr. Mechele Collin is Medical Director for Pulmonary Rehab at [facility].

## 2023-05-31 ENCOUNTER — Encounter (HOSPITAL_COMMUNITY)
Admission: RE | Admit: 2023-05-31 | Discharge: 2023-05-31 | Disposition: A | Discharge status: Home / Self Care | Payer: Medicare HMO | Source: Ambulatory Visit | Attending: Pulmonary Disease | Admitting: Pulmonary Disease

## 2023-05-31 DIAGNOSIS — Z87891 Personal history of nicotine dependence: Secondary | ICD-10-CM | POA: Diagnosis not present

## 2023-05-31 DIAGNOSIS — J449 Chronic obstructive pulmonary disease, unspecified: Secondary | ICD-10-CM | POA: Diagnosis not present

## 2023-05-31 NOTE — Progress Notes (Signed)
Daily Session Note  Patient Details  Name: [name] MRN: [id number] Date of Birth: [birth year] Referring Provider:   [name] Pulmonary Rehab Walk Test from 02/23/2023 in [facility]  Referring Provider [name]       Encounter Date: 05/31/2023  Check In:  Session Check In - 05/31/23 1325       Check-In   Supervising physician immediately available to respond to emergencies [facility] MD immediately available    Physician(s) [name], NP    Location [facility]    Staff Present [name], MS, ACSM-CEP, Exercise Physiologist;[name], ACSM-CEP, Exercise Physiologist;[name], RN, BSN    Virtual Visit No    Medication changes reported     No    Fall or balance concerns reported    No    Tobacco Cessation No Change    Warm-up and Cool-down Performed as group-led instruction    Resistance Training Performed Yes    VAD Patient? No    PAD/SET Patient? No      Pain Assessment   Currently in Pain? No/denies    Multiple Pain Sites No             Capillary Blood Glucose: No results found for this or any previous visit (from the past 24 hour(s)).    Social History   Tobacco Use  Smoking Status Former   Current packs/day: 0.00   Average packs/day: 1 pack/day for 56.0 years (56.0 ttl pk-yrs)   Types: Cigarettes   Start date: 06/22/1959   Quit date: 06/22/2015   Years since quitting: 7.9  Smokeless Tobacco Never    Goals Met:  Independence with exercise equipment Improved SOB with ADL's Exercise tolerated well No report of concerns or symptoms today Strength training completed today  Goals Unmet:  Not Applicable  Comments: Pt completed six minute walk test today and graduated. Service time is from 1311 to 1440.    Dr. Mechele Collin is Medical Director for Pulmonary Rehab at [facility].

## 2023-05-31 NOTE — Progress Notes (Signed)
Discharge Progress Report  Patient Details  Name: [name] MRN: [id number] Date of Birth: Dec 03, 1940 Referring Provider:   [name] Pulmonary Rehab Walk Test from 02/23/2023 in [facility]  Referring Provider Alva        Number of Visits: 82  Reason for Discharge:  Patient reached a stable level of exercise. Patient independent in their exercise. Patient has met program and personal goals.  Smoking History:  Social History   Tobacco Use  Smoking Status Former   Current packs/day: 0.00   Average packs/day: 1 pack/day for 56.0 years (56.0 ttl pk-yrs)   Types: Cigarettes   Start date: 06/22/1959   Quit date: 06/22/2015   Years since quitting: 7.9  Smokeless Tobacco Never    Diagnosis:  Stage 2 moderate COPD by GOLD classification (HCC)  ADL [facility]:  Pulmonary Assessment Scores     Row Name 02/23/23 1142 05/19/23 1539 05/31/23 1532     ADL [facility]   ADL Phase -- Exit Exit   SOB Score total 28 37 --     CAT Score   CAT Score 18 14 --     mMRC Score   mMRC Score 3 -- 4            Initial Exercise Prescription:  Initial Exercise Prescription - 02/23/23 1200       Date of Initial Exercise RX and Referring Provider   Date 02/23/23    Referring Provider [name]    Expected Discharge Date 05/19/23      NuStep   Level 1    SPM 60    Minutes 15    METs 2.5      Track   Minutes 15    METs 2.5      Prescription Details   Frequency (times per week) 2    Duration Progress to 30 minutes of continuous aerobic without signs/symptoms of physical distress      Intensity   THRR 40-80% of Max Heartrate 56-111    Ratings of Perceived Exertion 11-13    Perceived Dyspnea 0-4      Progression   Progression Continue progressive overload as per policy without signs/symptoms or physical distress.      Resistance Training   Training Prescription Yes    Weight red bands    Reps 10-15              Discharge Exercise Prescription (Final Exercise Prescription Changes):  Exercise Prescription Changes - 05/24/23 1500       Response to Exercise   Blood Pressure (Admit) 142/72    Blood Pressure (Exercise) 150/70    Blood Pressure (Exit) 126/64    Heart Rate (Admit) 110 bpm    Heart Rate (Exercise) 124 bpm    Heart Rate (Exit) 119 bpm    Oxygen Saturation (Admit) 94 %    Oxygen Saturation (Exercise) 91 %    Oxygen Saturation (Exit) 94 %    Rating of Perceived Exertion (Exercise) 11    Perceived Dyspnea (Exercise) 1    Duration Continue with 30 min of aerobic exercise without signs/symptoms of physical distress.    Intensity THRR unchanged      Progression   Progression Continue to progress workloads to maintain intensity without signs/symptoms of physical distress.      Resistance Training   Training Prescription Yes    Weight blue bands    Reps 10-15    Time 10 Minutes  NuStep   Level 4    Minutes 15    METs 2.2      Track   Laps 9    Minutes 15    METs 2.38             Functional Capacity:  6 Minute Walk     Row Name 02/23/23 1411 05/31/23 1524       6 Minute Walk   Phase Initial Discharge    Distance 885 feet 1092 feet    Distance % Change -- 23.4 %    Distance Feet Change -- 207 ft    Walk Time 6 minutes 6 minutes    # of Rest Breaks 0 0    MPH 1.68 2.07    METS 1.6 2.27    RPE 10 11    Perceived Dyspnea  1 1    VO2 Peak 5.6 7.93    Resting HR 76 bpm 107 bpm    Resting BP 130/62 128/68    Resting Oxygen Saturation  97 % 92 %    Exercise Oxygen Saturation  during 6 min walk 91 % 91 %    Max Ex. HR 103 bpm 129 bpm    Max Ex. BP 146/68 150/70    2 Minute Post BP 124/60 110/60      Interval HR   1 Minute HR 80 108    2 Minute HR 93 120    3 Minute HR 96 125    4 Minute HR 98 126    5 Minute HR 100 129    6 Minute HR 103 129    2 Minute Post HR 81 112    Interval Heart Rate? Yes --      Interval Oxygen   Interval Oxygen? Yes --     Baseline Oxygen Saturation % 97 % 92 %    1 Minute Oxygen Saturation % 96 % 91 %    1 Minute Liters of Oxygen 0 L 0 L    2 Minute Oxygen Saturation % 92 % 92 %    2 Minute Liters of Oxygen 0 L 0 L    3 Minute Oxygen Saturation % 92 % 92 %    3 Minute Liters of Oxygen 0 L 0 L    4 Minute Oxygen Saturation % 91 % 91 %    4 Minute Liters of Oxygen 0 L 0 L    5 Minute Oxygen Saturation % 92 % 91 %    5 Minute Liters of Oxygen 0 L 0 L    6 Minute Oxygen Saturation % 92 % 91 %    6 Minute Liters of Oxygen 0 L 0 L    2 Minute Post Oxygen Saturation % 95 % 96 %    2 Minute Post Liters of Oxygen 0 L 0 L             Psychological, QOL, Others - Outcomes: PHQ 2/9:    05/19/2023    3:38 PM 02/23/2023   11:44 AM 01/25/2023    3:12 PM 01/04/2023   11:07 AM 10/18/2022    2:33 PM  Depression screen PHQ 2/9  Decreased Interest 0 0 0 0 0  Down, Depressed, Hopeless 0 0 0 0 0  PHQ - 2 Score 0 0 0 0 0  Altered sleeping 0 0     Tired, decreased energy 0 0     Change in appetite 0 0     Feeling bad  or failure about yourself  0 0     Trouble concentrating 0 0     Moving slowly or fidgety/restless 0 0     Suicidal thoughts 0 0     PHQ-9 Score 0 0     Difficult doing work/chores Not difficult at all        Quality of Life:   Personal Goals: Goals established at orientation with interventions provided to work toward goal.  Personal Goals and Risk Factors at Admission - 02/23/23 1129       Core Components/Risk Factors/Patient Goals on Admission    Weight Management Weight Loss    Improve shortness of breath with ADL's Yes    Intervention Provide education, individualized exercise plan and daily activity instruction to help decrease symptoms of SOB with activities of daily living.    Expected Outcomes Short Term: Improve cardiorespiratory fitness to achieve a reduction of symptoms when performing ADLs;Long Term: Be able to perform more ADLs without symptoms or delay the onset of symptoms               Personal Goals Discharge:  Goals and Risk Factor Review     Row Name 03/11/23 1055 04/11/23 1558 05/04/23 1345 05/31/23 1619       Core Components/Risk Factors/Patient Goals Review   Personal Goals Review Weight Management/Obesity;Improve shortness of breath with ADL's;Develop more efficient breathing techniques such as purse lipped breathing and diaphragmatic breathing and practicing self-pacing with activity. Weight Management/Obesity;Improve shortness of breath with ADL's;Develop more efficient breathing techniques such as purse lipped breathing and diaphragmatic breathing and practicing self-pacing with activity. Weight Management/Obesity;Improve shortness of breath with ADL's;Develop more efficient breathing techniques such as purse lipped breathing and diaphragmatic breathing and practicing self-pacing with activity. Weight Management/Obesity;Improve shortness of breath with ADL's;Develop more efficient breathing techniques such as purse lipped breathing and diaphragmatic breathing and practicing self-pacing with activity.    Review Goals not met. [name] has attended 1 class. Goal not met for weight loss. [name] is working with our dietician, but her weight is up from starting the program. Goal in progress on developing more efficient breathing techniques such as purse lipped breathing and diaphragmatic breathing; and practicing self-pacing with activity. Goal in progress on improving her shortness of breath with ADLs. [name] is maintaining her oxygen saturation of 88% or higher on room air with exertion. Goal still in progress weight loss. [name] is working with our dietician, but her weight is up from the time she started the program. She is still working with our dietician to make lifestyle changes and increase servings of fruits and vegetables. Goal in progress on developing more efficient breathing techniques such as purse lipped breathing and diaphragmatic breathing; and  practicing self-pacing with activity. [name] can self-pace but needs to be prompted to initiate PLB when short of breath. We are still working on this. Goal in progress on improving her shortness of breath with ADLs. [name] is maintaining her oxygen saturation of 88% or higher on room air with exertion. She has recently been out for the last 2 weeks due to COPD exacerbation and COVID. [name] graduated the [employer] on 05/31/23. Core Compents/Risk Factors/Patient Goals Review are as follows: Goal not met at the time of graduation for weight loss. [name] is working with our dietician, but her weight is up from the time she started the program. She is still working on implementing lifestyle changes and increase servings of fruits and vegetables. Goal met for developing more efficient breathing techniques  such as purse lipped breathing and diaphragmatic breathing; and practicing self-pacing with activity. [name] can self-pace and can initiate PLB when short of breath. She knows how to stop and perform PLB while walking the track when she gets SOB. Goal met on improving her shortness of breath with ADLs. [name] is maintaining her oxygen saturation of 88% or higher on room air with exertion. [name] was able to increase her METs, workload, and increased her distance by 23.4% on her post . We are proud of [name]'s progress in the program!    Expected Outcomes See admission goals See admission goals For [name] to lose weight, improve her shortness of breath with ADLs and develop more efficient breathing techniques For [name] to continue exercising, lose weight, and decrease her shortness of breath post-graduation from [employer] program.             Exercise Goals and Review:  Exercise Goals     Row Name 02/23/23 1125 03/09/23 1530 04/08/23 0950 05/02/23 1142       Exercise Goals   Increase Physical Activity Yes Yes Yes Yes    Intervention Provide advice, education, support and counseling about physical  activity/exercise needs.;Develop an individualized exercise prescription for aerobic and resistive training based on initial evaluation findings, risk stratification, comorbidities and participant's personal goals. Provide advice, education, support and counseling about physical activity/exercise needs.;Develop an individualized exercise prescription for aerobic and resistive training based on initial evaluation findings, risk stratification, comorbidities and participant's personal goals. Provide advice, education, support and counseling about physical activity/exercise needs.;Develop an individualized exercise prescription for aerobic and resistive training based on initial evaluation findings, risk stratification, comorbidities and participant's personal goals. Provide advice, education, support and counseling about physical activity/exercise needs.;Develop an individualized exercise prescription for aerobic and resistive training based on initial evaluation findings, risk stratification, comorbidities and participant's personal goals.    Expected Outcomes Short Term: Attend rehab on a regular basis to increase amount of physical activity.;Long Term: Exercising regularly at least 3-5 days a week.;Long Term: Add in home exercise to make exercise part of routine and to increase amount of physical activity. Short Term: Attend rehab on a regular basis to increase amount of physical activity.;Long Term: Exercising regularly at least 3-5 days a week.;Long Term: Add in home exercise to make exercise part of routine and to increase amount of physical activity. Short Term: Attend rehab on a regular basis to increase amount of physical activity.;Long Term: Exercising regularly at least 3-5 days a week.;Long Term: Add in home exercise to make exercise part of routine and to increase amount of physical activity. Short Term: Attend rehab on a regular basis to increase amount of physical activity.;Long Term: Exercising  regularly at least 3-5 days a week.;Long Term: Add in home exercise to make exercise part of routine and to increase amount of physical activity.    Increase Strength and Stamina Yes Yes Yes Yes    Intervention Provide advice, education, support and counseling about physical activity/exercise needs.;Develop an individualized exercise prescription for aerobic and resistive training based on initial evaluation findings, risk stratification, comorbidities and participant's personal goals. Provide advice, education, support and counseling about physical activity/exercise needs.;Develop an individualized exercise prescription for aerobic and resistive training based on initial evaluation findings, risk stratification, comorbidities and participant's personal goals. Provide advice, education, support and counseling about physical activity/exercise needs.;Develop an individualized exercise prescription for aerobic and resistive training based on initial evaluation findings, risk stratification, comorbidities and participant's personal goals. Provide advice, education, support and  counseling about physical activity/exercise needs.;Develop an individualized exercise prescription for aerobic and resistive training based on initial evaluation findings, risk stratification, comorbidities and participant's personal goals.    Expected Outcomes Short Term: Increase workloads from initial exercise prescription for resistance, speed, and METs.;Short Term: Perform resistance training exercises routinely during rehab and add in resistance training at home;Long Term: Improve cardiorespiratory fitness, muscular endurance and strength as measured by increased METs and functional capacity ( ) Short Term: Increase workloads from initial exercise prescription for resistance, speed, and METs.;Short Term: Perform resistance training exercises routinely during rehab and add in resistance training at home;Long Term: Improve  cardiorespiratory fitness, muscular endurance and strength as measured by increased METs and functional capacity ( ) Short Term: Increase workloads from initial exercise prescription for resistance, speed, and METs.;Short Term: Perform resistance training exercises routinely during rehab and add in resistance training at home;Long Term: Improve cardiorespiratory fitness, muscular endurance and strength as measured by increased METs and functional capacity ( ) Short Term: Increase workloads from initial exercise prescription for resistance, speed, and METs.;Short Term: Perform resistance training exercises routinely during rehab and add in resistance training at home;Long Term: Improve cardiorespiratory fitness, muscular endurance and strength as measured by increased METs and functional capacity ( )    Able to understand and use rate of perceived exertion (RPE) scale Yes Yes Yes Yes    Intervention Provide education and explanation on how to use RPE scale Provide education and explanation on how to use RPE scale Provide education and explanation on how to use RPE scale Provide education and explanation on how to use RPE scale    Expected Outcomes Short Term: Able to use RPE daily in rehab to express subjective intensity level;Long Term:  Able to use RPE to guide intensity level when exercising independently Short Term: Able to use RPE daily in rehab to express subjective intensity level;Long Term:  Able to use RPE to guide intensity level when exercising independently Short Term: Able to use RPE daily in rehab to express subjective intensity level;Long Term:  Able to use RPE to guide intensity level when exercising independently Short Term: Able to use RPE daily in rehab to express subjective intensity level;Long Term:  Able to use RPE to guide intensity level when exercising independently    Able to understand and use Dyspnea scale Yes Yes Yes Yes    Intervention Provide education and explanation on  how to use Dyspnea scale Provide education and explanation on how to use Dyspnea scale Provide education and explanation on how to use Dyspnea scale Provide education and explanation on how to use Dyspnea scale    Expected Outcomes Short Term: Able to use Dyspnea scale daily in rehab to express subjective sense of shortness of breath during exertion;Long Term: Able to use Dyspnea scale to guide intensity level when exercising independently Short Term: Able to use Dyspnea scale daily in rehab to express subjective sense of shortness of breath during exertion;Long Term: Able to use Dyspnea scale to guide intensity level when exercising independently Short Term: Able to use Dyspnea scale daily in rehab to express subjective sense of shortness of breath during exertion;Long Term: Able to use Dyspnea scale to guide intensity level when exercising independently Short Term: Able to use Dyspnea scale daily in rehab to express subjective sense of shortness of breath during exertion;Long Term: Able to use Dyspnea scale to guide intensity level when exercising independently    Knowledge and understanding of Target Heart Rate Range (THRR) Yes Yes Yes Yes  Intervention Provide education and explanation of THRR including how the numbers were predicted and where they are located for reference Provide education and explanation of THRR including how the numbers were predicted and where they are located for reference Provide education and explanation of THRR including how the numbers were predicted and where they are located for reference Provide education and explanation of THRR including how the numbers were predicted and where they are located for reference    Expected Outcomes Short Term: Able to state/look up THRR;Short Term: Able to use daily as guideline for intensity in rehab;Long Term: Able to use THRR to govern intensity when exercising independently Short Term: Able to state/look up THRR;Short Term: Able to use daily  as guideline for intensity in rehab;Long Term: Able to use THRR to govern intensity when exercising independently Short Term: Able to state/look up THRR;Short Term: Able to use daily as guideline for intensity in rehab;Long Term: Able to use THRR to govern intensity when exercising independently Short Term: Able to state/look up THRR;Short Term: Able to use daily as guideline for intensity in rehab;Long Term: Able to use THRR to govern intensity when exercising independently    Understanding of Exercise Prescription Yes Yes Yes Yes    Expected Outcomes Short Term: Able to explain program exercise prescription;Long Term: Able to explain home exercise prescription to exercise independently Short Term: Able to explain program exercise prescription;Long Term: Able to explain home exercise prescription to exercise independently Short Term: Able to explain program exercise prescription;Long Term: Able to explain home exercise prescription to exercise independently Short Term: Able to explain program exercise prescription;Long Term: Able to explain home exercise prescription to exercise independently             Exercise Goals Re-Evaluation:  Exercise Goals Re-Evaluation     Row Name 03/09/23 1530 04/08/23 0950 05/02/23 1142 05/31/23 1533       Exercise Goal Re-Evaluation   Exercise Goals Review Increase Physical Activity;Able to understand and use Dyspnea scale;Understanding of Exercise Prescription;Increase Strength and Stamina;Knowledge and understanding of Target Heart Rate Range (THRR);Able to understand and use rate of perceived exertion (RPE) scale Increase Physical Activity;Able to understand and use Dyspnea scale;Understanding of Exercise Prescription;Increase Strength and Stamina;Knowledge and understanding of Target Heart Rate Range (THRR);Able to understand and use rate of perceived exertion (RPE) scale Increase Physical Activity;Able to understand and use Dyspnea scale;Understanding of  Exercise Prescription;Increase Strength and Stamina;Knowledge and understanding of Target Heart Rate Range (THRR);Able to understand and use rate of perceived exertion (RPE) scale Increase Physical Activity;Able to understand and use Dyspnea scale;Understanding of Exercise Prescription;Increase Strength and Stamina;Knowledge and understanding of Target Heart Rate Range (THRR);Able to understand and use rate of perceived exertion (RPE) scale    Comments Pt has completed one day of group exercise. She walked track 15 min with walker, METs 2.23. Pt then exercised on the recumbent stepper for 15 min, level 1, METs 1.7. Tolerated fair. Performed warm up and cool down with verbal and demonstrative cues. Will progress as tolerated. Pt has completed 8 day of group exercise, missing 1 session. She walked track 15 min with walker, METs 2.23. Pt then exercised on the recumbent stepper for 15 min, level 2, METs 1.9. Tolerating fair, has not began to progress. Performs warm up and cool down with support in front of her. Will progress as tolerated. Pt has completed 11 days of group exercise, missing 5 sessions for illness and various reasons. She is walking the track for 15 min  with walker, now 2.54 METs. Pt then is exercising on the recumbent stepper for 15 min, level 3, METs 2.0. Tolerating fair, has progressed minimally. She performs warm up and cool down with support in front of her. Will progress as able. Pt has completed 18 days of group exercise, missing 6 sessions for illness and various reasons. She plateaued at walking the track for 15 min with walker 2.54 METs. Pt exercised on the recumbent stepper for 15 min, level 4, max METs 2.4. She started at 1.7 METs on the stepper. She increased her 6 min walk test by 207 ft, 23.4% increase. She also increased her grip strength from 23 to 30 kg. She is moving better and able to do more exercise. It is noted that her HR is more elevated since orientation.    Expected Outcomes  Through exercise at rehab and at home, the patient will decrease shortness of breath with daily activities and feel confident in carrying out an exercise regime at home. Through exercise at rehab and at home, the patient will decrease shortness of breath with daily activities and feel confident in carrying out an exercise regime at home. Through exercise at rehab and at home, the patient will decrease shortness of breath with daily activities and feel confident in carrying out an exercise regime at home. Through exercise at rehab and at home, the patient will decrease shortness of breath with daily activities and feel confident in carrying out an exercise regime at home.             Nutrition & Weight - Outcomes:  Pre Biometrics - 02/23/23 1423       Pre Biometrics   Grip Strength 23 kg             Post Biometrics - 05/31/23 1532        Post  Biometrics   Grip Strength 30 kg             Nutrition:  Nutrition Therapy & Goals - 05/10/23 1434       Nutrition Therapy   Diet Heart Healthy Diet    Drug/Food Interactions Statins/Certain Fruits      Personal Nutrition Goals   Nutrition Goal Patient to improve diet quality by using the plate method as a guide for meal planning to include lean protein/plant protein, fruits, vegetables, whole grains, nonfat dairy as part of a well-balanced diet.   goal in progress.   Comments Goals in progress. [name] reports she is not taking rybelsus for blood sugar support and she does not check her blood sugars. She reports decreased appetite; however, she is up 1.3# since starting with our program. She reports that she has made many dietary changes including reduced simple sugars/refined carbohydrate snacking during the day and at night. She continues to eat two meal daily rich in protein and fiber. She is drinking one Boost glucose control daily with breakfast (190kcals, 16g protein). Her nephew is a good support. Patient will benefit from  participation in pulmonary rehab rehab for nutrition, exercise, and lifestyle modification.      Intervention Plan   Intervention Prescribe, educate and counsel regarding individualized specific dietary modifications aiming towards targeted core components such as weight, hypertension, lipid management, diabetes, heart failure and other comorbidities.;Nutrition handout(s) given to patient.    Expected Outcomes Short Term Goal: Understand basic principles of dietary content, such as calories, fat, sodium, cholesterol and nutrients.;Long Term Goal: Adherence to prescribed nutrition plan.  Nutrition Discharge:  Nutrition Assessments - 05/19/23 1448       Rate Your Plate Scores   Pre Score 67    Post Score 68             Education Questionnaire Score:  Knowledge Questionnaire Score - 05/19/23 1539       Knowledge Questionnaire Score   Post Score 18/18            [name] graduated from the [facility] on 05/31/23 completing 18 sessions. Psychosocial assessment at discharge revealed no change in her PHQ 2&9 from pre rehab to post rehab, both scores 0/0. [name] denies any needs at time of discharge.   Core Compents/Risk Factors/Patient Goals Review are as follows: Goal not met at the time of graduation for weight loss. [name] is working with our dietician, but her weight is up from the time she started the program. She is still working on implementing lifestyle changes and increase servings of fruits and vegetables. Goal met for developing more efficient breathing techniques such as purse lipped breathing and diaphragmatic breathing; and practicing self-pacing with activity. [name] can self-pace and can initiate PLB when short of breath. She knows how to stop and perform PLB while walking the track when she gets SOB. Goal met on improving her shortness of breath with ADLs. [name] is maintaining her oxygen saturation of 88% or higher on room air with exertion. [name]  was able to increase her METs, workload, and increased her distance by 23.4% on her post . We are proud of [name]'s progress in the program!    Goals reviewed with patient; copy given to patient.

## 2023-06-02 ENCOUNTER — Ambulatory Visit (HOSPITAL_COMMUNITY): Payer: Medicare HMO | Attending: Cardiology

## 2023-06-02 DIAGNOSIS — R0609 Other forms of dyspnea: Secondary | ICD-10-CM | POA: Insufficient documentation

## 2023-06-02 DIAGNOSIS — R011 Cardiac murmur, unspecified: Secondary | ICD-10-CM

## 2023-06-02 LAB — ECHOCARDIOGRAM COMPLETE
Area-P 1/2: 3.83 cm2
Est EF: >75
S' Lateral: 1.6 cm

## 2023-06-06 ENCOUNTER — Other Ambulatory Visit: Payer: Self-pay | Admitting: Nurse Practitioner

## 2023-06-06 DIAGNOSIS — N183 Chronic kidney disease, stage 3 unspecified: Secondary | ICD-10-CM

## 2023-06-14 NOTE — Progress Notes (Signed)
Called and spoke with patient, advised her of results/recommendations per Rubye Oaks NP.  She verbalized understanding.  She is in for a recall to see Dr. [name] 11/2023.  Nothing further needed.

## 2023-06-17 ENCOUNTER — Other Ambulatory Visit: Payer: Self-pay | Admitting: Pulmonary Disease

## 2023-06-17 DIAGNOSIS — J441 Chronic obstructive pulmonary disease with (acute) exacerbation: Secondary | ICD-10-CM

## 2023-06-25 ENCOUNTER — Other Ambulatory Visit: Payer: Self-pay | Admitting: Pulmonary Disease

## 2023-06-27 ENCOUNTER — Other Ambulatory Visit: Payer: Self-pay | Admitting: Nurse Practitioner

## 2023-06-27 DIAGNOSIS — N183 Chronic kidney disease, stage 3 unspecified: Secondary | ICD-10-CM

## 2023-06-29 ENCOUNTER — Ambulatory Visit: Payer: Self-pay

## 2023-06-29 NOTE — Patient Outreach (Signed)
Care Coordination   Follow Up Visit Note   06/29/2023 Name: [name] MRN: [id number] DOB: 09-Dec-1940  [name] is a 82 y.o. year old female who sees Shannon Felts, FNP for primary care. I spoke with  [name] by phone today.  What matters to the patients health and wellness today?  Patient will continue taking her medications exactly as prescribed to help manage her diabetes.     Goals Addressed               This Visit's Progress     Patient Stated     I would like to work on lower my A1c (pt-stated)   On track     Care Coordination Interventions: Provided education to patient about basic DM disease process Review of patient status, including review of consultant's reports, relevant laboratory and other test results, and medications completed Assessed patient's understanding of A1c goal: <7% Provided patient with written educational materials related to hypo and hyperglycemia and importance of correct treatment Advised patient, providing education and rationale, to check cbg daily before meals and at bedtime and record, calling PCP for findings outside established parameters Determined patient will need a dm follow up scheduled with her PCP Sent in basket message to Shannon Felts FNP requesting outreach to patient to schedule a dm follow up Mailed printed educational materials related to diabetes management  Lab Results  Component Value Date   HGBA1C 7.0 (H) 01/04/2023       Other     To have right knee pain evaluated        Care Coordination Interventions: Reviewed provider established plan for pain management Counseled on the importance of reporting any/all new or changed pain symptoms or management strategies to pain management provider Advised patient to report to care team affect of pain on daily activities Advised patient to discuss persistent pain with provider Educated patient about [facility] [place]      COMPLETED: To start  pulmonary rehab as directed for decreased lung function        Care Coordination Interventions: Evaluation of [name] treatment plan related to COPD and patient's adherence to plan as established by provider Determined patient completed her Pulmonary Rehab with good results Reviewed medications with patient and discussed importance of medication adherence Discussed plans with patient for ongoing care coordination follow up and provided patient with direct contact information for nurse care coordinator    Interventions Today    Flowsheet Row Most Recent Value  Chronic Disease   Chronic disease during today's visit Diabetes, Other  [Emphysema,  s/p Adenocarcinoma of lung,  dyspnea,  right knee pain]  General Interventions   General Interventions Discussed/Reviewed General Interventions Discussed, General Interventions Reviewed, Labs, Doctor Visits  Doctor Visits Discussed/Reviewed Doctor Visits Discussed, Doctor Visits Reviewed, PCP, Specialist  Exercise Interventions   Exercise Discussed/Reviewed Physical Activity  Physical Activity Discussed/Reviewed Physical Activity Reviewed, Physical Activity Discussed  Education Interventions   Education Provided Provided Education, Provided Printed Education  Provided Verbal Education On Nutrition, When to see the doctor, Labs, Blood Sugar Monitoring, Medication, Foot Care, Eye Care  Labs Reviewed Hgb A1c  Nutrition Interventions   Nutrition Discussed/Reviewed Nutrition Discussed, Nutrition Reviewed, Portion sizes, Carbohydrate meal planning, Fluid intake  Pharmacy Interventions   Pharmacy Dicussed/Reviewed Pharmacy Topics Discussed, Pharmacy Topics Reviewed, Medications and their functions          SDOH assessments and interventions completed:  No     Care Coordination Interventions:  Yes,  provided   Follow up plan: Follow up call scheduled for 08/29/23 @11 :30 AM    Encounter Outcome:  Patient Visit Completed

## 2023-06-29 NOTE — Patient Instructions (Addendum)
Visit Information  Thank you for taking time to visit with me today. Please don't hesitate to contact me if I can be of assistance to you.   Following are the goals we discussed today:   Goals Addressed               This Visit's Progress     Patient Stated     I would like to work on lower my A1c (pt-stated)   On track     Care Coordination Interventions: Provided education to patient about basic DM disease process Review of patient status, including review of consultant's reports, relevant laboratory and other test results, and medications completed Assessed patient's understanding of A1c goal: <7% Provided patient with written educational materials related to hypo and hyperglycemia and importance of correct treatment Advised patient, providing education and rationale, to check cbg daily before meals and at bedtime and record, calling PCP for findings outside established parameters Determined patient will need a dm follow up scheduled with her PCP Sent in basket message to Arnette Felts FNP requesting outreach to patient to schedule a dm follow up Mailed printed educational materials related to diabetes management  Lab Results  Component Value Date   HGBA1C 7.0 (H) 01/04/2023        Other     To have right knee pain evaluated        Care Coordination Interventions: Reviewed provider established plan for pain management Counseled on the importance of reporting any/all new or changed pain symptoms or management strategies to pain management provider Advised patient to report to care team affect of pain on daily activities Advised patient to discuss persistent pain with provider Educated patient about [facility] [place]      COMPLETED: To start pulmonary rehab as directed for decreased lung function        Care Coordination Interventions: Evaluation of current treatment plan related to COPD and patient's adherence to plan as established by provider Determined patient  completed her Pulmonary Rehab with good results Reviewed medications with patient and discussed importance of medication adherence Discussed plans with patient for ongoing care coordination follow up and provided patient with direct contact information for nurse care coordinator        Our next appointment is by telephone on 08/29/23 at 11:30 AM  Please call the care guide team at [phone] if you need to cancel or reschedule your appointment.   If you are experiencing a Mental Health or Behavioral Health Crisis or need someone to talk to, please call 1-800-273-TALK (toll free, 24 hour hotline)  The patient verbalized understanding of instructions, educational materials, and care plan provided today and DECLINED offer to receive copy of patient instructions, educational materials, and care plan.   Delsa Sale RN BSN CCM [place]  [facility], [facility] Nurse Care Coordinator  Direct Dial: [phone] Website: [email or web address]

## 2023-08-10 ENCOUNTER — Telehealth: Payer: Self-pay | Admitting: Adult Health

## 2023-08-10 MED ORDER — PAXLOVID (300/100) 20 X 150 MG & 10 X 100MG PO TBPK: 2.0000 | ORAL_TABLET | Freq: Two times a day (BID) | ORAL | 0 refills | Status: DC

## 2023-08-10 NOTE — Telephone Encounter (Signed)
Patient states tested positive for Covid 19. States having symptoms of cough and mucus. Would like antibx called into pharmacy. Pharmacy is [facility]. Patient phone number is [phone].

## 2023-08-10 NOTE — Telephone Encounter (Signed)
Patient is aware of below message/recommendations and voiced her understanding.  Nothing further needed.  

## 2023-08-10 NOTE — Telephone Encounter (Signed)
Lm x1 for patient.  

## 2023-08-10 NOTE — Telephone Encounter (Signed)
Called the pt and there was unfortunately no answer- LMTCB.

## 2023-08-10 NOTE — Telephone Encounter (Signed)
 Sent in paxlovid

## 2023-08-11 ENCOUNTER — Telehealth: Payer: Self-pay | Admitting: Pulmonary Disease

## 2023-08-11 ENCOUNTER — Other Ambulatory Visit (HOSPITAL_BASED_OUTPATIENT_CLINIC_OR_DEPARTMENT_OTHER): Payer: Self-pay

## 2023-08-11 MED ORDER — PAXLOVID (300/100) 20 X 150 MG & 10 X 100MG PO TBPK: 2.0000 | ORAL_TABLET | Freq: Two times a day (BID) | ORAL | 0 refills | Status: AC

## 2023-08-11 NOTE — Telephone Encounter (Signed)
At lunch will call back in a few minutes.

## 2023-08-11 NOTE — Telephone Encounter (Signed)
Message left on machine medication had been resent.

## 2023-08-11 NOTE — Telephone Encounter (Signed)
Spoke with [name] and notified that it was renal dose.

## 2023-08-11 NOTE — Telephone Encounter (Signed)
Resent

## 2023-08-11 NOTE — Telephone Encounter (Signed)
[name] from [facility] would like to know if it is the renal dose or non renal dose, [name] phone number is [phone].

## 2023-08-11 NOTE — Telephone Encounter (Signed)
Patient states that pharmacy still has not received the plaxovid that was called [facility] yesterday [facility] [address]

## 2023-08-14 ENCOUNTER — Other Ambulatory Visit: Payer: Self-pay | Admitting: Pulmonary Disease

## 2023-08-17 ENCOUNTER — Telehealth: Payer: Self-pay | Admitting: *Deleted

## 2023-08-17 NOTE — Telephone Encounter (Signed)
Contacted regarding PREP class availability at the [facility] or [facility]. Left message to return my call for more information.

## 2023-08-21 ENCOUNTER — Other Ambulatory Visit: Payer: Self-pay | Admitting: Nurse Practitioner

## 2023-08-29 ENCOUNTER — Ambulatory Visit: Payer: Self-pay

## 2023-08-29 NOTE — Patient Outreach (Signed)
  Care Coordination   Follow Up Visit Note   08/29/2023 Name: [name] MRN: [id number] DOB: [birth year]  [name] is a 82 y.o. year old female who sees Arnette Felts, FNP for primary care. I spoke with  [name] by phone today.  What matters to the patients health and wellness today?  Patient would like to recover fully from COVID 19 and manage her COPD.    Goals Addressed             This Visit's Progress    To make a full recovery from COVID and manage COPD       Care Coordination Interventions: Provided patient with basic written and verbal COPD education on self care/management/and exacerbation prevention Provided instruction about proper use of medications used for management of COPD including inhalers Advised patient to self assesses COPD action plan zone and make appointment with provider if in the yellow zone for 48 hours without improvement Provided education about and advised patient to utilize infection prevention strategies to reduce risk of respiratory infection Provided education to patient to enhance basic understanding of COVID-19 as a viral disease, measures to prevent exposure, signs and symptoms, recommended vaccine schedule, when to contact provider       Interventions Today    Flowsheet Row Most Recent Value  Chronic Disease   Chronic disease during today's visit Chronic Obstructive Pulmonary Disease (COPD), Other  [s/p COVID 19 infection]  General Interventions   General Interventions Discussed/Reviewed General Interventions Reviewed, General Interventions Discussed, Doctor Visits, Vaccines  Vaccines COVID-19  Doctor Visits Discussed/Reviewed Doctor Visits Discussed, Doctor Visits Reviewed, Specialist  Exercise Interventions   Exercise Discussed/Reviewed Physical Activity, Exercise Reviewed, Exercise Discussed  Physical Activity Discussed/Reviewed Physical Activity Discussed, Physical Activity Reviewed, PREP  Education Interventions    Education Provided Provided Education, Provided Printed Education  Provided Verbal Education On When to see the doctor, Medication, Exercise  Pharmacy Interventions   Pharmacy Dicussed/Reviewed Pharmacy Topics Reviewed, Pharmacy Topics Discussed, Medications and their functions          SDOH assessments and interventions completed:  Yes  SDOH Interventions Today    Flowsheet Row Most Recent Value  SDOH Interventions   Food Insecurity Interventions Intervention Not Indicated  Housing Interventions Intervention Not Indicated  Utilities Interventions Intervention Not Indicated        Care Coordination Interventions:  Yes, provided   Follow up plan: Follow up call scheduled for 10/31/23 @11 :30 AM    Encounter Outcome:  Patient Visit Completed

## 2023-08-29 NOTE — Patient Instructions (Signed)
Visit Information  Thank you for taking time to visit with me today. Please don't hesitate to contact me if I can be of assistance to you.   Following are the goals we discussed today:   Goals Addressed             This Visit's Progress    To make a full recovery from COVID and manage COPD       Care Coordination Interventions: Provided patient with basic written and verbal COPD education on self care/management/and exacerbation prevention Provided instruction about proper use of medications used for management of COPD including inhalers Advised patient to self assesses COPD action plan zone and make appointment with provider if in the yellow zone for 48 hours without improvement Provided education about and advised patient to utilize infection prevention strategies to reduce risk of respiratory infection Provided education to patient to enhance basic understanding of COVID-19 as a viral disease, measures to prevent exposure, signs and symptoms, recommended vaccine schedule, when to contact provider           Our next appointment is by telephone on 10/31/23 at 11:30 AM  Please call the care guide team at [phone] if you need to cancel or reschedule your appointment.   If you are experiencing a Mental Health or Behavioral Health Crisis or need someone to talk to, please call 1-800-273-TALK (toll free, 24 hour hotline)  Patient verbalizes understanding of instructions and care plan provided today and agrees to view in MyChart. Active MyChart status and patient understanding of how to access instructions and care plan via MyChart confirmed with patient.     Delsa Sale RN BSN CCM [place]  [facility], [facility] Nurse Care Coordinator  Direct Dial: [phone] Website: [email or web address]

## 2023-09-02 DIAGNOSIS — Z1231 Encounter for screening mammogram for malignant neoplasm of breast: Secondary | ICD-10-CM | POA: Diagnosis not present

## 2023-09-02 LAB — HM MAMMOGRAPHY

## 2023-09-05 ENCOUNTER — Encounter: Payer: Self-pay | Admitting: Nurse Practitioner

## 2023-09-14 ENCOUNTER — Telehealth: Payer: Self-pay | Admitting: *Deleted

## 2023-09-14 NOTE — Telephone Encounter (Signed)
 Returned my call regarding PREP Class referral. Informed there would not be any forthcoming classes at the [facility]. She will call me back if she would like to join a February class at the [facility].

## 2023-09-28 ENCOUNTER — Other Ambulatory Visit: Payer: Self-pay | Admitting: Nurse Practitioner

## 2023-09-28 DIAGNOSIS — G4700 Insomnia, unspecified: Secondary | ICD-10-CM

## 2023-10-03 ENCOUNTER — Other Ambulatory Visit: Payer: Self-pay | Admitting: Nurse Practitioner

## 2023-10-03 DIAGNOSIS — N183 Chronic kidney disease, stage 3 unspecified: Secondary | ICD-10-CM

## 2023-10-05 ENCOUNTER — Other Ambulatory Visit: Payer: Self-pay | Admitting: Nurse Practitioner

## 2023-10-09 ENCOUNTER — Other Ambulatory Visit: Payer: Self-pay | Admitting: Pulmonary Disease

## 2023-10-12 ENCOUNTER — Ambulatory Visit: Payer: Medicare HMO

## 2023-10-12 ENCOUNTER — Other Ambulatory Visit: Payer: Self-pay | Admitting: Nurse Practitioner

## 2023-10-12 VITALS — BP 126/66 | HR 99 | Temp 98.3°F | Ht 63.0 in | Wt 160.0 lb

## 2023-10-12 DIAGNOSIS — R6881 Early satiety: Secondary | ICD-10-CM

## 2023-10-12 DIAGNOSIS — Z0000 Encounter for general adult medical examination without abnormal findings: Secondary | ICD-10-CM | POA: Diagnosis not present

## 2023-10-12 NOTE — Patient Instructions (Signed)
 Ms. Mogel , Thank you for taking time to come for your Medicare Wellness Visit. I appreciate your ongoing commitment to your health goals. Please review the following plan we discussed and let me know if I can assist you in the future.   Referrals/Orders/Follow-Ups/Clinician Recommendations: none  This is a list of the screening recommended for you and due dates:  Health Maintenance  Topic Date Due   COVID-19 Vaccine (3 - Moderna risk series) 10/08/2020   DEXA scan (bone density measurement)  07/23/2022   Eye exam for diabetics  02/26/2023   Complete foot exam   06/16/2023   Hemoglobin A1C  07/06/2023   Zoster (Shingles) Vaccine (1 of 2) 01/09/2024*   Yearly kidney function blood test for diabetes  01/04/2024   Yearly kidney health urinalysis for diabetes  01/04/2024   Mammogram  09/01/2024   Medicare Annual Wellness Visit  10/11/2024   DTaP/Tdap/Td vaccine (2 - Td or Tdap) 01/03/2033   Pneumonia Vaccine  Completed   Flu Shot  Completed   HPV Vaccine  Aged Out   Hepatitis C Screening  Discontinued  *Topic was postponed. The date shown is not the original due date.    Advanced directives: (Declined) Advance directive discussed with you today. Even though you declined this today, please call our office should you change your mind, and we can give you the proper paperwork for you to fill out.  Next Medicare Annual Wellness Visit scheduled for next year: Yes  insert Preventive Care attachment Insert FALL PREVENTION attachment if needed

## 2023-10-12 NOTE — Progress Notes (Signed)
 Subjective:   [name] is a 83 y.o. female who presents for Medicare Annual (Subsequent) preventive examination.  Visit Complete: In person    Cardiac Risk Factors include: advanced age (>38men, >71 women);diabetes mellitus;dyslipidemia;hypertension     Objective:    Today's Vitals   10/12/23 1618  BP: 126/66  Pulse: 99  Temp: 98.3 F (36.8 C)  TempSrc: Oral  SpO2: 94%  Weight: 160 lb (72.6 kg)  Height: 5' 3 (1.6 m)   Body mass index is 28.34 kg/m.     10/12/2023    4:26 PM 02/23/2023   11:31 AM 09/30/2022   10:41 AM 09/23/2021   11:19 AM 03/04/2021    2:07 PM 02/05/2021    2:59 PM 05/28/2020   10:40 AM  Advanced Directives  Does Patient Have a Medical Advance Directive? No No No No No No No  Would patient like information on creating a medical advance directive? No - Patient declined No - Patient declined  No - Patient declined No - Patient declined No - Patient declined No - Patient declined    Current Medications (verified) Outpatient Encounter Medications as of 10/12/2023  Medication Sig   albuterol  (PROVENTIL ) (2.5 MG/3ML) 0.083% nebulizer solution Take 3 mLs (2.5 mg total) by nebulization every 6 (six) hours as needed for wheezing or shortness of breath.   albuterol  (VENTOLIN  HFA) 108 (90 Base) MCG/ACT inhaler INHALE 2 PUFFS BY MOUTH EVERY 6 HOURS AS NEEDED FOR WHEEZE OR SHORTNESS OF BREATH NEED SPPT   aspirin  81 MG tablet Take 81 mg by mouth every evening.    Blood Glucose Monitoring Suppl (TRUE METRIX AIR GLUCOSE METER) w/Device KIT Use to check blood sugar 4 times a day. Dx code e11.65   Calcium  Carbonate-Vitamin D  600-400 MG-UNIT tablet Take 1 tablet by mouth daily. (Patient taking differently: Take 1 tablet by mouth daily. 600 mg of calcium  and 500 units of vitd)   cycloSPORINE  (RESTASIS ) 0.05 % ophthalmic emulsion 1 drop 2 (two) times daily.   donepezil  (ARICEPT ) 10 MG tablet TAKE 1 TABLET BY MOUTH EVERY DAY IN THE EVENING   ezetimibe  (ZETIA ) 10 MG  tablet Take 1 tablet (10 mg total) by mouth daily.   JARDIANCE  10 MG TABS tablet TAKE 1 TABLET BY MOUTH EVERY DAY   KLOR-CON  M20 20 MEQ tablet TAKE 1 TABLET BY MOUTH EVERY DAY WITH FOOD   lubiprostone  (AMITIZA ) 8 MCG capsule TAKE 1 CAPSULE (8 MCG TOTAL) BY MOUTH DAILY WITH BREAKFAST.   magnesium oxide (MAG-OX) 400 MG tablet Take 400 mg by mouth daily.   Melatonin 2.5 MG CAPS Take 2.5 mg by mouth at bedtime as needed (sleep).    mirtazapine  (REMERON ) 15 MG tablet TAKE 1 TABLET BY MOUTH EVERYDAY AT BEDTIME   Multiple Vitamin (MULTIVITAMIN) tablet Take 1 tablet by mouth daily.   niacin  (VITAMIN B3) 500 MG ER tablet TAKE 1 TABLET BY MOUTH EVERY DAY IN THE EVENING   OVER THE COUNTER MEDICATION Take 1 tablet by mouth daily.   pantoprazole  (PROTONIX ) 40 MG tablet TAKE 1 TABLET BY MOUTH TWICE A DAY   Probiotic Product (PROBIOTIC PO) Take by mouth.   rosuvastatin  (CRESTOR ) 10 MG tablet TAKE 1 TABLET BY MOUTH EVERY DAY   TRELEGY ELLIPTA  100-62.5-25 MCG/ACT AEPB INHALE 1 PUFF ONE TIME DAILY   TRUE METRIX BLOOD GLUCOSE TEST test strip USE AS INSTRUCTED TO CHECK BLOOD SUGAR TWICE DAILY   TRUEplus Lancets 33G MISC TEST BLOOD SUGAR AS DIRECTED   valACYclovir  (VALTREX ) 500 MG  tablet Take 1 tablet (500 mg total) by mouth daily.   White Petrolatum -Mineral Oil (SYSTANE NIGHTTIME OP) Place 1 drop into both eyes at bedtime as needed (for dry eyes).    No facility-administered encounter medications on file as of 10/12/2023.    Allergies (verified) Patient has no known allergies.   History: Past Medical History:  Diagnosis Date   Arthritis    Asthma    COPD (chronic obstructive pulmonary disease) (HCC)    Depression    Diabetes mellitus without complication (HCC)    Diverticulitis    GERD (gastroesophageal reflux disease)    Hyperlipidemia    Hypertension 06/15/2016   left lung ca dx'd 2016   Thyroid  nodule    Past Surgical History:  Procedure Laterality Date   BIOPSY THYROID      CATARACT  EXTRACTION     left eye   CATARACT EXTRACTION W/ INTRAOCULAR LENS IMPLANT     RIGHT   CESAREAN SECTION     EYE SURGERY     MASS EXCISION N/A 12/13/2014   Procedure: EXCISION OF VOCAL CORD POLYPS;  Surgeon: Alm Bouche, MD;  Location: [facility] OR;  Service: ENT;  Laterality: N/A;   MICROLARYNGOSCOPY N/A 12/13/2014   Procedure: MICROLARYNGOSCOPY;  Surgeon: Alm Bouche, MD;  Location: [facility] OR;  Service: ENT;  Laterality: N/A;   TUBAL LIGATION     VIDEO ASSISTED THORACOSCOPY (VATS)/ LOBECTOMY Left 07/04/2015   Procedure: VIDEO ASSISTED THORACOSCOPY (VATS) LEFT UPPER LOBECTOMY;  Surgeon: Elspeth JAYSON Millers, MD;  Location: [facility] OR;  Service: Thoracic;  Laterality: Left;   VIDEO BRONCHOSCOPY WITH INSERTION OF INTERBRONCHIAL VALVE (IBV) N/A 07/11/2015   Procedure: VIDEO BRONCHOSCOPY WITH INSERTION OF INTERBRONCHIAL VALVE (IBV);  Surgeon: Elspeth JAYSON Millers, MD;  Location: [facility] OR;  Service: Thoracic;  Laterality: N/A;   VIDEO BRONCHOSCOPY WITH INSERTION OF INTERBRONCHIAL VALVE (IBV) N/A 09/10/2015   Procedure: VIDEO BRONCHOSCOPY WITH REMOVAL OF INTERBRONCHIAL VALVES;  Surgeon: Elspeth JAYSON Millers, MD;  Location: [facility] OR;  Service: Thoracic;  Laterality: N/A;   Family History  Problem Relation Age of Onset   Renal cancer Brother    Diabetes Mother    Hypertension Mother    Renal Disease Mother    Healthy Father    Social History   Socioeconomic History   Marital status: Widowed    Spouse name: Not on file   Number of children: 5   Years of education: Not on file   Highest education level: Not on file  Occupational History   Occupation: retired  Tobacco Use   Smoking status: Former    Current packs/day: 0.00    Average packs/day: 1 pack/day for 56.0 years (56.0 ttl pk-yrs)    Types: Cigarettes    Start date: 06/22/1959    Quit date: 06/22/2015    Years since quitting: 8.3   Smokeless tobacco: Never  Vaping Use   Vaping status: Never Used  Substance and Sexual Activity   Alcohol use: No     Alcohol/week: 0.0 standard drinks of alcohol   Drug use: No   Sexual activity: Not Currently  Other Topics Concern   Not on file  Social History Narrative   Enjoys walking outside and being in the yard, feeds the birds   Social Drivers of Health   Financial Resource Strain: Low Risk  (10/12/2023)   Overall Financial Resource Strain (CARDIA)    Difficulty of Paying Living Expenses: Not hard at all  Food Insecurity: No Food Insecurity (10/12/2023)   Hunger Vital Sign  Worried About Programme Researcher, Broadcasting/film/video in the Last Year: Never true    Ran Out of Food in the Last Year: Never true  Transportation Needs: No Transportation Needs (10/12/2023)   PRAPARE - Administrator, Civil Service (Medical): No    Lack of Transportation (Non-Medical): No  Physical Activity: Insufficiently Active (10/12/2023)   Exercise Vital Sign    Days of Exercise per Week: 7 days    Minutes of Exercise per Session: 20 min  Stress: No Stress Concern Present (10/12/2023)   [employer] of Occupational Health - Occupational Stress Questionnaire    Feeling of Stress : Not at all  Social Connections: Unknown (10/12/2023)   Social Connection and Isolation Panel [NHANES]    Frequency of Communication with Friends and Family: Not on file    Frequency of Social Gatherings with Friends and Family: Never    Attends Religious Services: More than 4 times per year    Active Member of [employer] or Organizations: No    Attends Banker Meetings: Never    Marital Status: Widowed    Tobacco Counseling Counseling given: Not Answered   Clinical Intake:  Pre-visit preparation completed: Yes  Pain : No/denies pain     Nutritional Status: BMI 25 -29 Overweight Nutritional Risks: None Diabetes: Yes CBG done?: No Did pt. bring in CBG monitor from home?: No  How often do you need to have someone help you when you read instructions, pamphlets, or other written materials from your doctor or pharmacy?: 1 -  Never  Interpreter Needed?: No  Information entered by :: NAllen LPN   Activities of Daily Living    10/12/2023    4:20 PM  In your present state of health, do you have any difficulty performing the following activities:  Hearing? 1  Comment comprehending sometimes  Vision? 0  Difficulty concentrating or making decisions? 0  Walking or climbing stairs? 0  Dressing or bathing? 0  Doing errands, shopping? 0  Preparing Food and eating ? N  Using the Toilet? N  In the past six months, have you accidently leaked urine? Y  Comment with cough or sneeze  Do you have problems with loss of bowel control? N  Managing your Medications? N  Managing your Finances? N  Housekeeping or managing your Housekeeping? N    Patient Care Team: Georgina Speaks, FNP as PCP - General (General Practice) [facility] Collar, MD as Consulting Physician (Ophthalmology) Jude Harden GAILS, MD as Consulting Physician (Pulmonary Disease) Kerrin Elspeth BROCKS, MD as Consulting Physician (Cardiothoracic Surgery) Sherrod Sherrod, MD as Consulting Physician (Oncology) Morgan, Clayborne CROME, RN as VBCI Care Management  Indicate any recent Medical Services you may have received from other than [facility] providers in the past year (date may be approximate).     Assessment:   This is a routine wellness examination for [name].  Hearing/Vision screen Hearing Screening - Comments:: Trouble comprehending at times Vision Screening - Comments:: Regular eye exams, [facility]    Goals Addressed             This Visit's Progress    Patient Stated       10/12/2023, wants to weigh 148 pounds       Depression Screen    10/12/2023    4:28 PM 05/19/2023    3:38 PM 02/23/2023   11:44 AM 01/25/2023    3:12 PM 01/04/2023   11:07 AM 10/18/2022    2:33 PM 09/30/2022   10:41 AM  PHQ 2/9 Scores  PHQ - 2 Score 0 0 0 0 0 0 0  PHQ- 9 Score  0 0        Fall Risk    10/12/2023    4:27 PM 05/31/2023    1:26 PM 05/26/2023    3:14 PM  05/24/2023    2:00 PM 05/12/2023    2:30 PM  Fall Risk   Falls in the past year? 0 1 1 1 1   Number falls in past yr: 0 0 0 0 0  Injury with Fall? 0 1 1 1 1   Risk for fall due to : Medication side effect History of fall(s);Impaired balance/gait History of fall(s);Impaired balance/gait History of fall(s);Impaired balance/gait History of fall(s);Impaired balance/gait  Follow up Falls prevention discussed;Falls evaluation completed Falls evaluation completed;Falls prevention discussed Falls evaluation completed;Falls prevention discussed Falls evaluation completed Falls evaluation completed;Falls prevention discussed    MEDICARE RISK AT HOME: Medicare Risk at Home Any stairs in or around the home?: Yes If so, are there any without handrails?: No Home free of loose throw rugs in walkways, pet beds, electrical cords, etc?: Yes Adequate lighting in your home to reduce risk of falls?: Yes Life alert?: No Use of a cane, walker or w/c?: No Grab bars in the bathroom?: Yes Shower chair or bench in shower?: Yes Elevated toilet seat or a handicapped toilet?: Yes  TIMED UP AND GO:  Was the test performed?  Yes  Length of time to ambulate 10 feet: 6 sec Gait slow and steady without use of assistive device    Cognitive Function:        10/12/2023    4:28 PM 09/30/2022   10:43 AM 09/23/2021   11:23 AM 02/05/2021    3:02 PM 12/27/2019   11:13 AM  6CIT Screen  What Year? 0 points 0 points 0 points 0 points 0 points  What month? 0 points 0 points 0 points 0 points 0 points  What time? 0 points 0 points 0 points 3 points 3 points  Count back from 20 0 points 0 points 0 points 0 points 0 points  Months in reverse 0 points 0 points 0 points 0 points 0 points  Repeat phrase 0 points 4 points 2 points 6 points 0 points  Total Score 0 points 4 points 2 points 9 points 3 points    Immunizations Immunization History  Administered Date(s) Administered   Fluad Quad(high Dose 65+) 07/01/2020, 08/17/2021,  05/20/2022   Fluad Trivalent(High Dose 65+) 05/17/2023   Influenza, High Dose Seasonal PF 06/06/2016, 05/01/2017, 05/31/2018, 06/28/2019   Influenza, Quadrivalent, Recombinant, Inj, Pf 06/03/2019   Influenza-Unspecified 05/01/2017   Moderna SARS-COV2 Booster Vaccination 09/10/2020   Moderna Sars-Covid-2 Vaccination 10/19/2019, 11/16/2019   Pneumococcal Conjugate-13 07/17/2014, 06/15/2018   Pneumococcal Polysaccharide-23 09/07/2011   Tdap 01/04/2023    TDAP status: Up to date  Flu Vaccine status: Up to date  Pneumococcal vaccine status: Up to date  Covid-19 vaccine status: Information provided on how to obtain vaccines.   Qualifies for Shingles Vaccine? Yes   Zostavax completed No   Shingrix Completed?: No.    Education has been provided regarding the importance of this vaccine. Patient has been advised to call insurance company to determine out of pocket expense if they have not yet received this vaccine. Advised may also receive vaccine at local pharmacy or Health Dept. Verbalized acceptance and understanding.  Screening Tests Health Maintenance  Topic Date Due   COVID-19 Vaccine (3 - Moderna risk series)  10/08/2020   DEXA SCAN  07/23/2022   OPHTHALMOLOGY EXAM  02/26/2023   FOOT EXAM  06/16/2023   HEMOGLOBIN A1C  07/06/2023   Zoster Vaccines- Shingrix (1 of 2) 01/09/2024 (Originally 04/12/1960)   Diabetic kidney evaluation - eGFR measurement  01/04/2024   Diabetic kidney evaluation - Urine ACR  01/04/2024   MAMMOGRAM  09/01/2024   Medicare Annual Wellness (AWV)  10/11/2024   DTaP/Tdap/Td (2 - Td or Tdap) 01/03/2033   Pneumonia Vaccine 78+ Years old  Completed   INFLUENZA VACCINE  Completed   HPV VACCINES  Aged Out   Hepatitis C Screening  Discontinued    Health Maintenance  Health Maintenance Due  Topic Date Due   COVID-19 Vaccine (3 - Moderna risk series) 10/08/2020   DEXA SCAN  07/23/2022   OPHTHALMOLOGY EXAM  02/26/2023   FOOT EXAM  06/16/2023   HEMOGLOBIN A1C   07/06/2023    Colorectal cancer screening: No longer required.   Mammogram status: Completed 09/02/2023. Repeat every year  Bone Density status: Completed 07/23/2020.   Lung Cancer Screening: (Low Dose CT Chest recommended if Age 21-80 years, 20 pack-year currently smoking OR have quit w/in 15years.) does not qualify.   Lung Cancer Screening Referral: no  Additional Screening:  Hepatitis C Screening: does not qualify;   Vision Screening: Recommended annual ophthalmology exams for early detection of glaucoma and other disorders of the eye. Is the patient up to date with their annual eye exam?  Yes  Who is the provider or what is the name of the office in which the patient attends annual eye exams? [facility] If pt is not established with a provider, would they like to be referred to a provider to establish care? No .   Dental Screening: Recommended annual dental exams for proper oral hygiene  Diabetic Foot Exam: Diabetic Foot Exam: Overdue, Pt has been advised about the importance in completing this exam. Pt is scheduled for diabetic foot exam on next appointment.  Community Resource Referral / Chronic Care Management: CRR required this visit?  No   CCM required this visit?  No     Plan:     I have personally reviewed and noted the following in the patient's chart:   Medical and social history Use of alcohol, tobacco or illicit drugs  Current medications and supplements including opioid prescriptions. Patient is not currently taking opioid prescriptions. Functional ability and status Nutritional status Physical activity Advanced directives List of other physicians Hospitalizations, surgeries, and ER visits in previous 12 months Vitals Screenings to include cognitive, depression, and falls Referrals and appointments  In addition, I have reviewed and discussed with patient certain preventive protocols, quality metrics, and best practice recommendations. A written  personalized care plan for preventive services as well as general preventive health recommendations were provided to patient.     Ardella FORBES Dawn, LPN   03/09/7973   After Visit Summary: (In Person-Printed) AVS printed and given to the patient  Nurse Notes: none

## 2023-10-19 ENCOUNTER — Other Ambulatory Visit: Payer: Self-pay | Admitting: Nurse Practitioner

## 2023-10-29 ENCOUNTER — Other Ambulatory Visit: Payer: Self-pay | Admitting: Nurse Practitioner

## 2023-10-31 ENCOUNTER — Ambulatory Visit: Payer: Self-pay

## 2023-10-31 ENCOUNTER — Other Ambulatory Visit: Payer: Self-pay | Admitting: Nurse Practitioner

## 2023-10-31 NOTE — Patient Outreach (Signed)
 Care Coordination   10/31/2023 Name: [name] MRN: [id number] DOB: [birth year]   Care Coordination Outreach Attempts:  An unsuccessful outreach was attempted for an appointment today.  Follow Up Plan:  Additional outreach attempts will be made to offer the patient complex care management information and services.   Encounter Outcome:  No Answer   Care Coordination Interventions:  No, not indicated    Delsa Sale RN BSN CCM [place]  [facility] Nurse Care Coordinator  Direct Dial: [phone] Website: [email or web address]

## 2023-11-01 ENCOUNTER — Other Ambulatory Visit: Payer: Self-pay | Admitting: Nurse Practitioner

## 2023-11-01 NOTE — Telephone Encounter (Unsigned)
 Copied from CRM [id number]. Topic: Clinical - Medication Refill >> Nov 01, 2023  4:31 PM Martha Clan wrote: Most Recent Primary Care Visit:  Provider: Barb Merino  Department: [facility]  Visit Type: MEDICARE AWV, SEQUENTIAL  Date: 10/12/2023  Medication: valACYclovir (VALTREX) 500 MG tablet [[id number]]  Has the patient contacted their pharmacy? Yes (Agent: If no, request that the patient contact the pharmacy for the refill. If patient does not wish to contact the pharmacy document the reason why and proceed with request.) (Agent: If yes, when and what did the pharmacy advise?)  Is this the correct pharmacy for this prescription? Yes If no, delete pharmacy and type the correct one.  This is the patient's preferred pharmacy:  [facility] [phone] [place] Otto, [place] - [address] [place] [address] Phone: [phone] Fax: [phone]   Has the prescription been filled recently? No  Is the patient out of the medication? Yes  Has the patient been seen for an appointment in the last year OR does the patient have an upcoming appointment? Yes  Can we respond through MyChart? No  Agent: Please be advised that Rx refills may take up to 3 business days. We ask that you follow-up with your pharmacy.

## 2023-11-15 ENCOUNTER — Ambulatory Visit (HOSPITAL_BASED_OUTPATIENT_CLINIC_OR_DEPARTMENT_OTHER): Payer: Medicare HMO | Admitting: Pulmonary Disease

## 2023-11-15 ENCOUNTER — Encounter (HOSPITAL_BASED_OUTPATIENT_CLINIC_OR_DEPARTMENT_OTHER): Payer: Self-pay | Admitting: Pulmonary Disease

## 2023-11-15 ENCOUNTER — Ambulatory Visit (HOSPITAL_BASED_OUTPATIENT_CLINIC_OR_DEPARTMENT_OTHER)

## 2023-11-15 VITALS — BP 122/64 | HR 102 | Ht 63.0 in | Wt 158.2 lb

## 2023-11-15 DIAGNOSIS — J309 Allergic rhinitis, unspecified: Secondary | ICD-10-CM

## 2023-11-15 DIAGNOSIS — J441 Chronic obstructive pulmonary disease with (acute) exacerbation: Secondary | ICD-10-CM

## 2023-11-15 DIAGNOSIS — E119 Type 2 diabetes mellitus without complications: Secondary | ICD-10-CM | POA: Diagnosis not present

## 2023-11-15 DIAGNOSIS — R079 Chest pain, unspecified: Secondary | ICD-10-CM

## 2023-11-15 MED ORDER — PREDNISONE 10 MG PO TABS: ORAL_TABLET | ORAL | 0 refills | Status: DC

## 2023-11-15 NOTE — Progress Notes (Signed)
 Subjective:    Patient ID: [name], female    DOB: [birth year], 83 y.o.   MRN: [id number]  HPI  83  yo ex-smoker for follow-up of COPD and lung cancer She smoked about 60 pack years. She worked in a Special educational needs teacher for 20 years   06/2015 underwent left VATS with left upper lobectomy and mediastinal lymph node dissection. Path  showed invasive adenocarcinoma,  adenocarcinoma involving the visceral pleura in addition to lymphovascular invasion. The resection margins as well as the dissected lymph nodes were negative for malignancy. She had persistent air leak postop and required endobronchial valves for resolution  The patient, with a history of COPD and prior lung cancer status post lobectomy, presents with allergies and COPD exacerbation. She reports that her allergies are acting up, causing congestion and wheezing. Despite taking Zyrtec at night, her symptoms have not improved. She also reports that she has been experiencing pain in her left chest, axillary line and in the area of her previous surgical incisions. This pain has been ongoing for a few months and seems to be increasing in frequency. She also mentions that she has been experiencing some discomfort in her left chest for a few months, which seems to be worsening. The pain is not constant and seems to be triggered by certain movements.   Significant tests/ events reviewed   05/2022 CT chest >> nodular scarring left lower lobe   05/2021 CT chest >> stable areas of scarring left upper lobe postsurgical,leftlower lobe nodular scarring 12/2017 CT chest >> stable, nodular area of left lung scarring 2016 Pre-op PFT - FEV1 73 %, DLCO 48%   03/2016 FEV1 66%   Spirometry 10/2017 shows an FEV1 at 63% ratio 57 and FVC of 86%. PFTs 01/2023 moderate airway obstruction, ratio 60, FEV1 55%, TLC 153%/hyperinflation, DLCO 8.1/44%  Review of Systems neg for any significant sore throat, dysphagia, itching, sneezing, nasal congestion or excess/  purulent secretions, fever, chills, sweats, unintended wt loss, pleuritic or exertional cp, hempoptysis, orthopnea pnd or change in chronic leg swelling. Also denies presyncope, palpitations, heartburn, abdominal pain, nausea, vomiting, diarrhea or change in bowel or urinary habits, dysuria,hematuria, rash, arthralgias, visual complaints, headache, numbness weakness or ataxia.     Objective:   Physical Exam  Gen. Pleasant, obese, in no distress ENT - no lesions, no post nasal drip Neck: No JVD, no thyromegaly, no carotid bruits Lungs: no use of accessory muscles, no dullness to percussion, decreased BS, scattered rhonchi  Cardiovascular: Rhythm regular, heart sounds  normal, no murmurs or gallops, no peripheral edema Musculoskeletal: No deformities, no cyanosis or clubbing , no tremors       Assessment & Plan:   COPD exacerbation COPD exacerbation likely due to allergies, with wheezing and congestion. Currently managed with Trelegy and albuterol. Prednisone is considered to alleviate symptoms, with informed consent regarding potential blood sugar disruption. - Prescribe prednisone for 10 days - Refill albuterol and nebulizer prescriptions  Allergic rhinitis Allergic rhinitis with congestion and wheezing. Zyrtec at night is insufficient. Prednisone is expected to aid in symptom relief. - Continue Zyrtec at night - Consider additional allergy management if symptoms persist  Left chest pain Intermittent left chest pain, possibly related to previous lung surgery, with differential diagnosis including scar tissue or fibrosis. Primary care suggested nodules or pleurisy. Plan to start with a chest x-ray and consider further imaging if necessary. - Order chest x-ray to evaluate chest pain >> no infx, no rib fracture - Consider further imaging  if chest x-ray is inconclusive or symptoms worsen  Diabetes mellitus Diabetes managed with Jardiance, with stable blood sugar levels. Prednisone may  affect blood sugar control, requiring monitoring. - Monitor blood sugar levels during prednisone treatment

## 2023-11-15 NOTE — Patient Instructions (Addendum)
 CXR today  Short course of prednisone  X refill albuterol nebs

## 2023-11-30 ENCOUNTER — Ambulatory Visit (INDEPENDENT_AMBULATORY_CARE_PROVIDER_SITE_OTHER): Admitting: Family Medicine

## 2023-11-30 ENCOUNTER — Encounter: Payer: Self-pay | Admitting: Family Medicine

## 2023-11-30 VITALS — BP 130/70 | HR 105 | Temp 99.0°F | Ht 63.0 in | Wt 158.0 lb

## 2023-11-30 DIAGNOSIS — J410 Simple chronic bronchitis: Secondary | ICD-10-CM

## 2023-11-30 DIAGNOSIS — B370 Candidal stomatitis: Secondary | ICD-10-CM | POA: Diagnosis not present

## 2023-11-30 MED ORDER — NYSTATIN 100000 UNIT/ML MT SUSP
10.0000 mL | Freq: Three times a day (TID) | ORAL | 0 refills | Status: DC
Start: 2023-11-30 — End: 2023-12-02

## 2023-11-30 MED ORDER — NYSTATIN 100000 UNIT/ML MT SUSP: 5.0000 mL | Freq: Three times a day (TID) | OROMUCOSAL | 0 refills | Status: DC

## 2023-11-30 NOTE — Progress Notes (Signed)
 [name], CMA,acting as a Neurosurgeon for [employer], NP.,have documented all relevant documentation on the behalf of Shannon Hose, NP,as directed by  Shannon Hose, NP while in the presence of Shannon Hose, NP.  Subjective:  Patient ID: [name] Current , female    DOB: [birth year] , 83 y.o.   MRN: [id number]  Chief Complaint  Patient presents with   [name]    HPI  Patient is 83 year old female who presents today for evaluation of white patches or spots on her tongue. . Patient reports she took some prednisone last week for exacerbation of bronchitis and she is not sure if that made her get thrush. She reports she feels like she is having a little throat discomfort. Patient states that the symptoms started on Monday and she has been using her mouthwash after using brushing her teeth. She denies any pain in her mouth or any open sores.     Past Medical History:  Diagnosis Date   Arthritis    Asthma    COPD (chronic obstructive pulmonary disease) (HCC)    Depression    Diabetes mellitus without complication (HCC)    Diverticulitis    GERD (gastroesophageal reflux disease)    Hyperlipidemia    Hypertension 06/15/2016   left lung ca dx'd 2016   Thyroid nodule      Family History  Problem Relation Age of Onset   Renal cancer Brother    Diabetes Mother    Hypertension Mother    Renal Disease Mother    Healthy Father      Current Outpatient Medications:    albuterol (PROVENTIL) (2.5 MG/3ML) 0.083% nebulizer solution, Take 3 mLs (2.5 mg total) by nebulization every 6 (six) hours as needed for wheezing or shortness of breath., Disp: 75 mL, Rfl: 12   aspirin 81 MG tablet, Take 81 mg by mouth every evening. , Disp: , Rfl:    Blood Glucose Monitoring Suppl (TRUE METRIX AIR GLUCOSE METER) w/Device KIT, Use to check blood sugar 4 times a day. Dx code e11.65, Disp: 1 kit, Rfl: 2   Calcium Carbonate-Vitamin D 600-400 MG-UNIT tablet, Take 1 tablet by mouth daily. (Patient taking  differently: Take 1 tablet by mouth daily. 600 mg of calcium and 500 units of vitd), Disp:  , Rfl:    cycloSPORINE (RESTASIS) 0.05 % ophthalmic emulsion, 1 drop 2 (two) times daily., Disp: , Rfl:    donepezil (ARICEPT) 10 MG tablet, TAKE 1 TABLET BY MOUTH EVERY DAY IN THE EVENING, Disp: 90 tablet, Rfl: 1   ezetimibe (ZETIA) 10 MG tablet, Take 1 tablet (10 mg total) by mouth daily., Disp: 90 tablet, Rfl: 2   JARDIANCE 10 MG TABS tablet, TAKE 1 TABLET BY MOUTH EVERY DAY, Disp: 30 tablet, Rfl: 2   KLOR-CON M20 20 MEQ tablet, TAKE 1 TABLET BY MOUTH EVERY DAY WITH FOOD, Disp: 90 tablet, Rfl: 1   lubiprostone (AMITIZA) 8 MCG capsule, TAKE 1 CAPSULE (8 MCG TOTAL) BY MOUTH DAILY WITH BREAKFAST., Disp: 90 capsule, Rfl: 1   magic mouthwash (nystatin, diphenhydrAMINE, alum & mag hydroxide) suspension mixture, Swish and spit 10 mLs 3 (three) times daily for 8 days., Disp: 240 mL, Rfl: 0   magnesium oxide (MAG-OX) 400 MG tablet, Take 400 mg by mouth daily., Disp: , Rfl:    Melatonin 2.5 MG CAPS, Take 2.5 mg by mouth at bedtime as needed (sleep). , Disp: , Rfl:    mirtazapine (REMERON) 15 MG tablet, TAKE 1 TABLET BY MOUTH  EVERYDAY AT BEDTIME, Disp: 90 tablet, Rfl: 1   Multiple Vitamin (MULTIVITAMIN) tablet, Take 1 tablet by mouth daily., Disp: , Rfl:    niacin (VITAMIN B3) 500 MG ER tablet, TAKE 1 TABLET BY MOUTH EVERY DAY IN THE EVENING, Disp: 90 tablet, Rfl: 3   OVER THE COUNTER MEDICATION, Take 1 tablet by mouth daily., Disp: , Rfl:    pantoprazole (PROTONIX) 40 MG tablet, TAKE 1 TABLET BY MOUTH TWICE A DAY, Disp: 180 tablet, Rfl: 1   Probiotic Product (PROBIOTIC PO), Take by mouth., Disp: , Rfl:    rosuvastatin (CRESTOR) 10 MG tablet, TAKE 1 TABLET BY MOUTH EVERY DAY, Disp: 90 tablet, Rfl: 1   TRELEGY ELLIPTA 100-62.5-25 MCG/ACT AEPB, INHALE 1 PUFF ONE TIME DAILY, Disp: 180 each, Rfl: 3   TRUE METRIX BLOOD GLUCOSE TEST test strip, USE AS INSTRUCTED TO CHECK BLOOD SUGAR TWICE DAILY, Disp: 200 strip, Rfl: 3    TRUEplus Lancets 33G MISC, TEST BLOOD SUGAR AS DIRECTED, Disp: 400 each, Rfl: 3   valACYclovir (VALTREX) 500 MG tablet, TAKE 1 TABLET (500 MG TOTAL) BY MOUTH DAILY., Disp: 90 tablet, Rfl: 0   White Petrolatum-Mineral Oil (SYSTANE NIGHTTIME OP), Place 1 drop into both eyes at bedtime as needed (for dry eyes). , Disp: , Rfl:    albuterol (VENTOLIN HFA) 108 (90 Base) MCG/ACT inhaler, INHALE 2 PUFFS BY MOUTH EVERY 6 HOURS AS NEEDED FOR WHEEZE OR SHORTNESS OF BREATH NEED SPPT, Disp: 8.5 each, Rfl: 1   No Known Allergies   Review of Systems  Constitutional: Negative.   HENT: Negative.  Negative for mouth sores.   Respiratory: Negative.    Cardiovascular: Negative.   Genitourinary: Negative.   Musculoskeletal:  Positive for gait problem.  Psychiatric/Behavioral: Negative.       Today's Vitals   11/30/23 1615  BP: 130/70  Pulse: (!) 105  Temp: 99 F (37.2 C)  TempSrc: Oral  Weight: 158 lb (71.7 kg)  Height: 5\' 3"  (1.6 m)  PainSc: 0-No pain   Body mass index is 27.99 kg/m.  Wt Readings from Last 3 Encounters:  11/30/23 158 lb (71.7 kg)  11/15/23 158 lb 3.2 oz (71.8 kg)  10/12/23 160 lb (72.6 kg)    The ASCVD Risk score (Arnett DK, et al., 2019) failed to calculate for the following reasons:   The 2019 ASCVD risk score is only valid for ages 49 to 14  Objective:  Physical Exam HENT:     Head: Normocephalic.     Mouth/Throat:     Lips: No lesions.     Mouth: Mucous membranes are moist. No oral lesions.     Tongue: No lesions. Tongue does not deviate from midline.     Comments: Had some white patches on her tongue Cardiovascular:     Rate and Rhythm: Tachycardia present.  Pulmonary:     Effort: Pulmonary effort is normal.     Breath sounds: Normal breath sounds.  Neurological:     Mental Status: She is alert.         Assessment And Plan:  Thrush Assessment & Plan: Use magic mouth wash TID as needed for 8 days.  Orders: -     magic mouthwash (nystatin,  diphenhydrAMINE, alum & mag hydroxide) suspension mixture; Swish and spit 10 mLs 3 (three) times daily for 8 days.  Dispense: 240 mL; Refill: 0  Simple chronic bronchitis (HCC) Assessment & Plan: Completed her dose of prednisone last week.     Return if symptoms worsen  or fail to improve, for Keep next appt.  Patient was given opportunity to ask questions. Patient verbalized understanding of the plan and was able to repeat key elements of the plan. All questions were answered to their satisfaction.  I, Shannon Hose, NP, have reviewed all documentation for this visit. The documentation on 12/05/2023 for the exam, diagnosis, procedures, and orders are all accurate and complete.    IF YOU HAVE BEEN REFERRED TO A SPECIALIST, IT MAY TAKE 1-2 WEEKS TO SCHEDULE/PROCESS THE REFERRAL. IF YOU HAVE NOT HEARD FROM US/SPECIALIST IN TWO WEEKS, PLEASE GIVE [name] A CALL AT [phone].

## 2023-12-01 ENCOUNTER — Other Ambulatory Visit: Payer: Self-pay | Admitting: Pulmonary Disease

## 2023-12-01 ENCOUNTER — Other Ambulatory Visit: Payer: Self-pay | Admitting: Nurse Practitioner

## 2023-12-02 ENCOUNTER — Other Ambulatory Visit: Payer: Self-pay | Admitting: Nurse Practitioner

## 2023-12-02 NOTE — Telephone Encounter (Signed)
 Copied from CRM [id number]. Topic: Clinical - Prescription Issue >> Dec 02, 2023 11:26 AM [name] wrote: Reason for CRM: pt needs a rx for nystatin her other rx was denied by her insurance please call [phone]

## 2023-12-05 DIAGNOSIS — B370 Candidal stomatitis: Secondary | ICD-10-CM | POA: Insufficient documentation

## 2023-12-05 MED ORDER — NYSTATIN 100000 UNIT/ML MT SUSP: 10.0000 mL | Freq: Three times a day (TID) | OROMUCOSAL | 0 refills | Status: AC

## 2023-12-05 NOTE — Assessment & Plan Note (Signed)
 Completed her dose of prednisone last week.

## 2023-12-05 NOTE — Assessment & Plan Note (Signed)
 Use magic mouth wash TID as needed for 8 days.

## 2023-12-06 ENCOUNTER — Other Ambulatory Visit: Payer: Self-pay | Admitting: Nurse Practitioner

## 2023-12-20 ENCOUNTER — Ambulatory Visit: Payer: Self-pay

## 2023-12-22 NOTE — Patient Outreach (Signed)
 Complex Care Management   Visit Note  12/20/2023  Name:  [name] MRN: [id number] DOB: 12/09/1940  Situation: Referral received for Complex Care Management related to COPD and Diabetes with Complications I obtained verbal consent from Patient.  Visit completed with patient on the phone  Background:   Past Medical History:  Diagnosis Date   Arthritis    Asthma    COPD (chronic obstructive pulmonary disease) (HCC)    Depression    Diabetes mellitus without complication (HCC)    Diverticulitis    GERD (gastroesophageal reflux disease)    Hyperlipidemia    Hypertension 06/15/2016   left lung ca dx'd 2016   Thyroid nodule     Assessment: Patient Reported Symptoms:  Cognitive Cognitive Status: Alert and oriented to person, place, and time Cognitive/Intellectual Conditions Management [RPT]: None reported or documented in medical history or problem list   Health Maintenance Behaviors: Annual physical exam, Healthy diet Healing Pattern: Average Health Facilitated by: Rest, Prayer/meditation  Neurological  Neurological Symptoms Reported: No Symptoms reported     HEENT HEENT Symptoms Reported: Change or loss of hearing HEENT Conditions: Ear problem(s) (change in hearing; patient would like a referral to Audiologist) HEENT Management Strategies: Routine screening HEENT Self-Management Outcome: 4 (good) Ear problem(s) (change in hearing; patient would like a referral to Audiologist)  Cardiovascular Cardiovascular Symptoms Reported: No symptoms reported Weight: 154 lb 12.8 oz (70.2 kg)  Respiratory Respiratory Symptoms Reported: Shortness of breath Respiratory Conditions: COPD Respiratory Self-Management Outcome: 4 (good)  Endocrine Patient reports the following symptoms related to hypoglycemia or hyperglycemia : No symptoms reported Is patient diabetic?: Yes Is patient checking blood sugars at home?: Yes Endocrine Conditions: Diabetes Endocrine Management Strategies:  Medication therapy, Routine screening Endocrine Self-Management Outcome: 4 (good)  Gastrointestinal Gastrointestinal Symptoms Reported: No symptoms reported   Nutrition Risk Screen (CP): No indicators present  Genitourinary  Genitourinary Symptoms: No Symptoms reported     Integumentary Integumentary Symptoms Reported: No symptoms reported    Musculoskeletal Musculoskelatal Symptoms Reviewed: No symptoms reported   Falls in the past year?: No Number of falls in past year: 1 or less Was there an injury with Fall?: No Fall Risk Category Calculator: 0 Patient Fall Risk Level: Low Fall Risk Patient at Risk for Falls Due to: No Fall Risks  Psychosocial Psychosocial Symptoms Reported: No symptoms reported   Major Change/Loss/Stressor/Fears (CP): Denies Techniques to [employer] with Loss/Stress/Change: Diversional activities, Spiritual practice(s) Quality of Family Relationships: supportive Do you feel physically threatened by others?: No      12/20/2023    3:06 PM  Depression screen PHQ 2/9  Decreased Interest 0  Down, Depressed, Hopeless 0  PHQ - 2 Score 0    There were no vitals filed for this visit.  Medications Reviewed Today     Reviewed by Kaylene Pascal, RN (Registered Nurse) on 12/20/23 at 1453  Med List Status: <None>   Medication Order Taking? Sig Documenting Provider Last Dose Status Informant  albuterol (PROVENTIL) (2.5 MG/3ML) 0.083% nebulizer solution [id number]  Take 3 mLs (2.5 mg total) by nebulization every 6 (six) hours as needed for wheezing or shortness of breath. Parrett, Macdonald Savoy, NP  Active   albuterol (VENTOLIN HFA) 108 (90 Base) MCG/ACT inhaler [id number] Yes INHALE 2 PUFFS BY MOUTH EVERY 6 HOURS AS NEEDED FOR WHEEZE OR SHORTNESS OF BREATH NEED SPPT Lind Repine, MD Taking Active   aspirin 81 MG tablet [id number] Yes Take 81 mg by mouth every evening.  [provider]  Taking Active Self  Blood Glucose Monitoring Suppl (TRUE METRIX AIR GLUCOSE METER)  w/Device KIT [id number]  Use to check blood sugar 4 times a day. Dx code e11.65 Susanna Epley, FNP  Active   Calcium Carbonate-Vitamin D 600-400 MG-UNIT tablet [id number] Yes Take 1 tablet by mouth daily.  Patient taking differently: Take 1 tablet by mouth daily. 600 mg of calcium and 500 units of vitd   Cleave Curling, MD Taking Active   cetirizine (ZYRTEC) 10 MG tablet [id number] Yes Take 10 mg by mouth daily. [provider] Taking Active   cycloSPORINE (RESTASIS) 0.05 % ophthalmic emulsion [id number] Yes 1 drop 2 (two) times daily. [provider] Taking Active   donepezil (ARICEPT) 10 MG tablet [id number] Yes TAKE 1 TABLET BY MOUTH EVERY DAY IN THE Byrd Cast, Abelino Able, FNP Taking Active   ezetimibe (ZETIA) 10 MG tablet [id number] Yes TAKE 1 TABLET BY MOUTH EVERY DAY  Patient taking differently: Take 10 mg by mouth at bedtime.   Susanna Epley, FNP Taking Active   fluticasone [facility]) 50 MCG/ACT nasal spray [id number] Yes Place into both nostrils daily. [provider] Taking Active   JARDIANCE 10 MG TABS tablet [id number] Yes TAKE 1 TABLET BY MOUTH EVERY DAY Susanna Epley, FNP Taking Active   KLOR-CON M20 20 MEQ tablet [id number] Yes TAKE 1 TABLET BY MOUTH EVERY DAY WITH FOOD Susanna Epley, FNP Taking Active   lubiprostone (AMITIZA) 8 MCG capsule [id number] Yes TAKE 1 CAPSULE (8 MCG TOTAL) BY MOUTH DAILY WITH BREAKFAST.  Patient taking differently: Take 8 mcg by mouth daily with breakfast. Patient takes every couple of days   Susanna Epley, FNP Taking Active   magnesium oxide (MAG-OX) 400 MG tablet [id number] Yes Take 400 mg by mouth at bedtime. [provider] Taking Active   Melatonin 2.5 MG CAPS [id number] Yes Take 2.5 mg by mouth at bedtime as needed (sleep).  [provider] Taking Active Self  mirtazapine (REMERON) 15 MG tablet [id number] Yes TAKE 1 TABLET BY MOUTH EVERYDAY AT BEDTIME Susanna Epley, FNP Taking Active   Multiple Vitamin (MULTIVITAMIN)  tablet [id number] Yes Take 1 tablet by mouth daily. [provider] Taking Active Self  niacin (VITAMIN B3) 500 MG ER tablet [id number] Yes TAKE 1 TABLET BY MOUTH EVERY DAY IN THE Burns Carwin, FNP Taking Active   OVER THE COUNTER MEDICATION [id number] Yes Take 1 tablet by mouth daily. [provider] Taking Active Self           Med Note Bonnie Butters   Thu Jun 23, 2017 11:19 AM) "Mega red 4 in 1"  pantoprazole (PROTONIX) 40 MG tablet [id number] Yes TAKE 1 TABLET BY MOUTH TWICE A DAY Moore, Janece, FNP Taking Active   Probiotic Product (PROBIOTIC PO) [id number] Yes Take by mouth daily at 6 (six) AM. [provider] Taking Active   rosuvastatin (CRESTOR) 10 MG tablet [id number] Yes TAKE 1 TABLET BY MOUTH EVERY DAY Susanna Epley, FNP Taking Active   TRELEGY ELLIPTA 100-62.5-25 MCG/ACT AEPB [id number] Yes INHALE 1 PUFF ONE TIME DAILY Lind Repine, MD Taking Active   TRUE METRIX BLOOD GLUCOSE TEST test strip [id number]  USE AS INSTRUCTED TO CHECK BLOOD SUGAR TWICE DAILY Susanna Epley, FNP  Active   TRUEplus Lancets 33G MISC [id number]  TEST BLOOD SUGAR AS DIRECTED Moore, Janece, FNP  Active   valACYclovir (VALTREX) 500 MG tablet [id number] Yes TAKE 1 TABLET (500 MG TOTAL) BY MOUTH DAILY. Susanna Epley, FNP Taking Active   [facility]  Petrolatum-Mineral Oil (SYSTANE NIGHTTIME OP) [id number] Yes Place 1 drop into both eyes at bedtime as needed (for dry eyes).  [provider] Taking Active Self           Med Note Jacob Master Sep 25, 2018  4:03 PM)    Med List Note Harding Li, [place] 02/22/19 1117): caltrate              Recommendation:   PCP Follow-up  Follow Up Plan:   Telephone follow up appointment date/time:  01/09/24 @12 :00 PM  Louanne Roussel RN BSN CCM [place]  [facility], [facility] Nurse Care Coordinator  Direct Dial: [phone] Website: [email or web address]

## 2023-12-22 NOTE — Patient Instructions (Signed)
 Visit Information  Thank you for taking time to visit with me today. Please don't hesitate to contact me if I can be of assistance to you before our next scheduled appointment.  Our next appointment is by telephone on 01/09/24 at 12:00 PM Please call the care guide team at [phone] if you need to cancel or reschedule your appointment.   Following is a copy of your care plan:   Goals Addressed               This Visit's Progress     Patient Stated     COMPLETED: I am worried about my appetite (pt-stated)        Care Coordination Interventions: Evaluation of current treatment plan related to nutritional imbalance and patient's adherence to plan as established by provider Reviewed medications with patient and discussed importance of medication adherence Determined patient is maintaining her current weight status Educated patient on best time of day to check her daily weights Instructed patient to try to eat 5-6 small meals per day and to keep her doctor informed of persistent weight loss or decline in appetite Body mass index is 27.99 kg/m.     Wt Readings from Last 3 Encounters:  11/30/23 158 lb (71.7 kg)  11/15/23 158 lb 3.2 oz (71.8 kg)  10/12/23 160 lb (72.6 kg)             COMPLETED: I would like to work on lower my A1c (pt-stated)        Care Coordination Interventions: See new goal       Other     COMPLETED: To have right knee pain evaluated        Care Coordination Interventions: Reviewed provider established plan for pain management Discussed with patient she is not experiencing any pain today Counseled on the importance of reporting any/all new or changed pain symptoms or management strategies to pain management provider Advised patient to report to care team affect of pain on daily activities Advised patient to discuss persistent pain with her Orthopedic provider and patient verbalizes understanding        COMPLETED: To make a full recovery from COVID  and manage COPD        Care Coordination Interventions: See new goal      VBCI RN Care Plan related to COPD        Problems:  Chronic Disease Management support and education needs related to COPD  Goal: Over the next 60 days the Patient will continue to work with Medical illustrator and/or Social Worker to address care management and care coordination needs related to COPD as evidenced by adherence to care management team scheduled appointments      Interventions:   COPD Interventions: Provided patient with basic written and verbal COPD education on self care/management/and exacerbation prevention Advised patient to track and manage COPD triggers Provided instruction about proper use of medications used for management of COPD including inhalers Advised patient to self assesses COPD action plan zone and make appointment with provider if in the yellow zone for 48 hours without improvement Advised patient to engage in light exercise as tolerated 3-5 days a week to aid in the the management of COPD Provided education about and advised patient to utilize infection prevention strategies to reduce risk of respiratory infection Discussed the importance of adequate rest and management of fatigue with COPD Assessed social determinant of health barriers  Patient Self-Care Activities:  Attend all scheduled provider appointments Call pharmacy for medication  refills 3-7 days in advance of running out of medications Call provider office for new concerns or questions  Take medications as prescribed   identify and avoid work-related triggers develop a rescue plan follow rescue plan if symptoms flare-up  Plan:  Next PCP appointment scheduled for: 01/05/24 @10 :20 AM Telephone follow up appointment with care management team member scheduled for: 01/09/24 @12 :00 PM           VBCI RN Care Plan related to type II diabetes        Problems:  Chronic Disease Management support and education needs related to  DMII  Goal: Over the next 60 days the Patient will continue to work with Medical illustrator and/or Social Worker to address care management and care coordination needs related to DMII as evidenced by adherence to care management team scheduled appointments      Interventions:   Diabetes Interventions: Assessed patient's understanding of A1c goal: <7% Reviewed medications with patient and discussed importance of medication adherence Provided patient with written educational materials related to hypo and hyperglycemia and importance of correct treatment Advised patient, providing education and rationale, to check cbg daily before meals and at bedtime and record, calling PCP for findings outside established parameters Review of patient status, including review of consultants reports, relevant laboratory and other test results, and medications completed Lab Results  Component Value Date   HGBA1C 7.0 (H) 01/04/2023    Patient Self-Care Activities:  Attend all scheduled provider appointments Call pharmacy for medication refills 3-7 days in advance of running out of medications Call provider office for new concerns or questions  Take medications as prescribed   check blood sugar at prescribed times: before meals and at bedtime check feet daily for cuts, sores or redness take the blood sugar log to all doctor visits trim toenails straight across drink 6 to 8 glasses of water each day fill half of plate with vegetables manage portion size  Plan:  Next PCP appointment scheduled for: 01/05/24 @10 :20 AM Telephone follow up appointment with care management team member scheduled for:  01/09/24 @12 :00 PM             Please call 1-800-273-TALK (toll free, 24 hour hotline) if you are experiencing a Mental Health or Behavioral Health Crisis or need someone to talk to.  The patient verbalized understanding of instructions, educational materials, and care plan provided today and DECLINED offer to  receive copy of patient instructions, educational materials, and care plan.   Louanne Roussel RN BSN CCM [place]  [facility], [facility] Nurse Care Coordinator  Direct Dial: [phone] Website: [email or web address]

## 2024-01-02 ENCOUNTER — Other Ambulatory Visit: Payer: Self-pay | Admitting: Nurse Practitioner

## 2024-01-02 DIAGNOSIS — N183 Chronic kidney disease, stage 3 unspecified: Secondary | ICD-10-CM

## 2024-01-02 MED ORDER — EMPAGLIFLOZIN 10 MG PO TABS: 10.0000 mg | ORAL_TABLET | Freq: Every day | ORAL | 2 refills | Status: DC

## 2024-01-02 NOTE — Telephone Encounter (Signed)
 Copied from CRM [id number]. Topic: Clinical - Medication Refill >> Jan 02, 2024  2:26 PM Leory Rands wrote: Most Recent Primary Care Visit:  Provider: Jarrett Merry, PAT  Department: [facility]  Visit Type: ACUTE  Date: 11/30/2023  Medication: JARDIANCE  10 MG TABS tablet [[id number]]  Has the patient contacted their pharmacy? Yes (Agent: If no, request that the patient contact the pharmacy for the refill. If patient does not wish to contact the pharmacy document the reason why and proceed with request.) (Agent: If yes, when and what did the pharmacy advise?)  Is this the correct pharmacy for this prescription? Yes If no, delete pharmacy and type the correct one.  This is the patient's preferred pharmacy:  [facility] [phone] Jonette Nestle,  - [address] [place] [address] Phone: [phone] Fax: [phone]     Has the prescription been filled recently? Yes  Is the patient out of the medication? Yes  Has the patient been seen for an appointment in the last year OR does the patient have an upcoming appointment? Yes  Can we respond through MyChart? No  Agent: Please be advised that Rx refills may take up to 3 business days. We ask that you follow-up with your pharmacy.

## 2024-01-02 NOTE — Telephone Encounter (Signed)
 Last Fill: 10/04/23  Last OV: 11/30/23 Next OV: 01/05/24  Routing to provider for review/authorization.

## 2024-01-05 ENCOUNTER — Ambulatory Visit (INDEPENDENT_AMBULATORY_CARE_PROVIDER_SITE_OTHER): Payer: Medicare HMO | Admitting: Nurse Practitioner

## 2024-01-05 ENCOUNTER — Encounter: Payer: Self-pay | Admitting: Nurse Practitioner

## 2024-01-05 VITALS — BP 140/80 | HR 105 | Temp 99.0°F | Ht 63.0 in | Wt 158.4 lb

## 2024-01-05 DIAGNOSIS — Z2821 Immunization not carried out because of patient refusal: Secondary | ICD-10-CM

## 2024-01-05 DIAGNOSIS — E782 Mixed hyperlipidemia: Secondary | ICD-10-CM | POA: Diagnosis not present

## 2024-01-05 DIAGNOSIS — Z0000 Encounter for general adult medical examination without abnormal findings: Secondary | ICD-10-CM | POA: Diagnosis not present

## 2024-01-05 DIAGNOSIS — Z79899 Other long term (current) drug therapy: Secondary | ICD-10-CM | POA: Diagnosis not present

## 2024-01-05 DIAGNOSIS — I129 Hypertensive chronic kidney disease with stage 1 through stage 4 chronic kidney disease, or unspecified chronic kidney disease: Secondary | ICD-10-CM

## 2024-01-05 DIAGNOSIS — N183 Chronic kidney disease, stage 3 unspecified: Secondary | ICD-10-CM | POA: Diagnosis not present

## 2024-01-05 DIAGNOSIS — E1122 Type 2 diabetes mellitus with diabetic chronic kidney disease: Secondary | ICD-10-CM | POA: Diagnosis not present

## 2024-01-05 DIAGNOSIS — J410 Simple chronic bronchitis: Secondary | ICD-10-CM | POA: Diagnosis not present

## 2024-01-05 DIAGNOSIS — E2839 Other primary ovarian failure: Secondary | ICD-10-CM | POA: Insufficient documentation

## 2024-01-05 LAB — POCT URINALYSIS DIP (CLINITEK)
Bilirubin, UA: NEGATIVE
Glucose, UA: 500 mg/dL — AB
Ketones, POC UA: NEGATIVE mg/dL
Leukocytes, UA: NEGATIVE
Nitrite, UA: NEGATIVE
POC PROTEIN,UA: NEGATIVE
Spec Grav, UA: 1.01 (ref 1.010–1.025)
Urobilinogen, UA: 0.2 U/dL
pH, UA: 5.5 (ref 5.0–8.0)

## 2024-01-05 NOTE — Assessment & Plan Note (Addendum)
 Blood pressure is well controlled. Continue current medications. 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

## 2024-01-05 NOTE — Assessment & Plan Note (Signed)
 A1c is stable, she has had a few increased blood sugars up to 145.

## 2024-01-05 NOTE — Progress Notes (Signed)
 [place], CMA,acting as a Neurosurgeon for Susanna Epley, FNP.,have documented all relevant documentation on the behalf of Susanna Epley, FNP,as directed by  Susanna Epley, FNP while in the presence of Susanna Epley, FNP.  Subjective:    Patient ID: [name] , female    DOB: December 18, 1940 , 83 y.o.   MRN: [id number]  Chief Complaint  Patient presents with   Annual Exam    Patient presents today for HM, Patient reports compliance with medication. Patient denies any chest pain, SOB, or headaches. Patient has no concerns today.     HPI  Patient is here for HTN and DM follow up. She reports taking Jardiance  10mg  for 2 weeks. Denies any side effects.  She has a concern with getting the shingrix vaccine, while on Valtrex . [facility] would not administer shot since she is being treated for a strain.   Diabetes She presents for her follow-up diabetic visit. She has type 2 diabetes mellitus. Her disease course has been stable. Pertinent negatives for hypoglycemia include no dizziness or headaches. Pertinent negatives for diabetes include no fatigue, no polydipsia, no polyphagia and no polyuria. There are no hypoglycemic complications. Diabetic complications include nephropathy. Risk factors for coronary artery disease include sedentary lifestyle, dyslipidemia, hypertension and diabetes mellitus. Current diabetic treatment includes diet. She is following a generally healthy diet. When asked about meal planning, she reported none. She has not had a previous visit with a dietitian. She rarely participates in exercise. She does not see a podiatrist.Eye exam current: scheduled to see eye doctor in June.  Hyperlipidemia This is a chronic problem. The current episode started more than 1 year ago. The problem is controlled. Recent lipid tests were reviewed and are variable. There are no known factors aggravating her hyperlipidemia. Current antihyperlipidemic treatment includes ezetimibe . There are no compliance  problems.  Risk factors for coronary artery disease include diabetes mellitus, dyslipidemia and a sedentary lifestyle.     Past Medical History:  Diagnosis Date   Arthritis    Asthma    COPD (chronic obstructive pulmonary disease) (HCC)    Depression    Diabetes mellitus without complication (HCC)    Diverticulitis    GERD (gastroesophageal reflux disease)    Hyperlipidemia    Hypertension 06/15/2016   left lung ca dx'd 2016   Thyroid  nodule      Family History  Problem Relation Age of Onset   Renal cancer Brother    Diabetes Mother    Hypertension Mother    Renal Disease Mother    Healthy Father      Current Outpatient Medications:    albuterol  (PROVENTIL ) (2.5 MG/3ML) 0.083% nebulizer solution, Take 3 mLs (2.5 mg total) by nebulization every 6 (six) hours as needed for wheezing or shortness of breath., Disp: 75 mL, Rfl: 12   albuterol  (VENTOLIN  HFA) 108 (90 Base) MCG/ACT inhaler, INHALE 2 PUFFS BY MOUTH EVERY 6 HOURS AS NEEDED FOR WHEEZE OR SHORTNESS OF BREATH NEED SPPT, Disp: 8.5 each, Rfl: 1   aspirin  81 MG tablet, Take 81 mg by mouth every evening. , Disp: , Rfl:    Blood Glucose Monitoring Suppl (TRUE METRIX AIR GLUCOSE METER) w/Device KIT, Use to check blood sugar 4 times a day. Dx code e11.65, Disp: 1 kit, Rfl: 2   Calcium  Carbonate-Vitamin D  600-400 MG-UNIT tablet, Take 1 tablet by mouth daily. (Patient taking differently: Take 1 tablet by mouth daily. 600 mg of calcium  and 500 units of vitd), Disp:  , Rfl:  cetirizine (ZYRTEC) 10 MG tablet, Take 10 mg by mouth daily., Disp: , Rfl:    cycloSPORINE  (RESTASIS ) 0.05 % ophthalmic emulsion, 1 drop 2 (two) times daily., Disp: , Rfl:    donepezil  (ARICEPT ) 10 MG tablet, TAKE 1 TABLET BY MOUTH EVERY DAY IN THE EVENING, Disp: 90 tablet, Rfl: 1   empagliflozin  (JARDIANCE ) 10 MG TABS tablet, Take 1 tablet (10 mg total) by mouth daily., Disp: 30 tablet, Rfl: 2   ezetimibe  (ZETIA ) 10 MG tablet, TAKE 1 TABLET BY MOUTH EVERY DAY  (Patient taking differently: Take 10 mg by mouth at bedtime.), Disp: 90 tablet, Rfl: 2   fluticasone  (FLONASE) 50 MCG/ACT nasal spray, Place into both nostrils daily., Disp: , Rfl:    KLOR-CON  M20 20 MEQ tablet, TAKE 1 TABLET BY MOUTH EVERY DAY WITH FOOD, Disp: 90 tablet, Rfl: 1   lubiprostone  (AMITIZA ) 8 MCG capsule, TAKE 1 CAPSULE (8 MCG TOTAL) BY MOUTH DAILY WITH BREAKFAST. (Patient taking differently: Take 8 mcg by mouth daily with breakfast. Patient takes every couple of days), Disp: 90 capsule, Rfl: 1   magnesium oxide (MAG-OX) 400 MG tablet, Take 400 mg by mouth at bedtime., Disp: , Rfl:    Melatonin 2.5 MG CAPS, Take 2.5 mg by mouth at bedtime as needed (sleep). , Disp: , Rfl:    mirtazapine  (REMERON ) 15 MG tablet, TAKE 1 TABLET BY MOUTH EVERYDAY AT BEDTIME, Disp: 90 tablet, Rfl: 1   Multiple Vitamin (MULTIVITAMIN) tablet, Take 1 tablet by mouth daily., Disp: , Rfl:    niacin  (VITAMIN B3) 500 MG ER tablet, TAKE 1 TABLET BY MOUTH EVERY DAY IN THE EVENING, Disp: 90 tablet, Rfl: 3   OVER THE COUNTER MEDICATION, Take 1 tablet by mouth daily., Disp: , Rfl:    pantoprazole  (PROTONIX ) 40 MG tablet, TAKE 1 TABLET BY MOUTH TWICE A DAY, Disp: 180 tablet, Rfl: 1   Probiotic Product (PROBIOTIC PO), Take by mouth daily at 6 (six) AM., Disp: , Rfl:    rosuvastatin  (CRESTOR ) 10 MG tablet, TAKE 1 TABLET BY MOUTH EVERY DAY, Disp: 90 tablet, Rfl: 1   TRELEGY ELLIPTA  100-62.5-25 MCG/ACT AEPB, INHALE 1 PUFF ONE TIME DAILY, Disp: 180 each, Rfl: 3   TRUE METRIX BLOOD GLUCOSE TEST test strip, USE AS INSTRUCTED TO CHECK BLOOD SUGAR TWICE DAILY, Disp: 200 strip, Rfl: 3   TRUEplus Lancets 33G MISC, TEST BLOOD SUGAR AS DIRECTED, Disp: 400 each, Rfl: 3   valACYclovir  (VALTREX ) 500 MG tablet, TAKE 1 TABLET (500 MG TOTAL) BY MOUTH DAILY., Disp: 90 tablet, Rfl: 0   White Petrolatum -Mineral Oil (SYSTANE NIGHTTIME OP), Place 1 drop into both eyes at bedtime as needed (for dry eyes). , Disp: , Rfl:    No Known Allergies     The patient states she uses post menopausal status for birth control. No LMP recorded. Patient is postmenopausal.  Negative for: breast discharge, breast lump(s), breast pain and breast self exam. Associated symptoms include abnormal vaginal bleeding. Pertinent negatives include abnormal bleeding (hematology), anxiety, decreased libido, depression, difficulty falling sleep, dyspareunia, history of infertility, nocturia, sexual dysfunction, sleep disturbances, urinary incontinence, urinary urgency, vaginal discharge and vaginal itching. Diet regular; she feels like her taste buds have changed since having covid the last time. The patient states her exercise level is minimal with walking 3 times a week. She does have moments when she moves her legs. She is in her yard more doing yard work, she will wear a mask.   The patient's tobacco use is:  Social  History   Tobacco Use  Smoking Status Former   Current packs/day: 0.00   Average packs/day: 1 pack/day for 56.0 years (56.0 ttl pk-yrs)   Types: Cigarettes   Start date: 06/22/1959   Quit date: 06/22/2015   Years since quitting: 8.5  Smokeless Tobacco Never   She has been exposed to passive smoke. The patient's alcohol use is:  Social History   Substance and Sexual Activity  Alcohol Use No   Alcohol/week: 0.0 standard drinks of alcohol    Review of Systems  Constitutional: Negative.  Negative for fatigue.  HENT: Negative.    Eyes: Negative.   Respiratory: Negative.    Cardiovascular: Negative.   Gastrointestinal: Negative.   Endocrine: Negative.  Negative for polydipsia, polyphagia and polyuria.  Genitourinary: Negative.   Musculoskeletal:  Positive for back pain (low back pain).  Skin: Negative.   Allergic/Immunologic: Negative.   Neurological: Negative.  Negative for dizziness and headaches.  Hematological: Negative.   Psychiatric/Behavioral: Negative.       Today's Vitals   01/05/24 1021  BP: (!) 140/80  Pulse: (!) 105   Temp: 99 F (37.2 C)  TempSrc: Oral  SpO2: 93%  Weight: 158 lb 6.4 oz (71.8 kg)  Height: 5\' 3"  (1.6 m)  PainSc: 0-No pain   Body mass index is 28.06 kg/m.  Wt Readings from Last 3 Encounters:  01/05/24 158 lb 6.4 oz (71.8 kg)  12/20/23 154 lb 12.8 oz (70.2 kg)  11/30/23 158 lb (71.7 kg)     Objective:  Physical Exam Vitals and nursing note reviewed.  Constitutional:      General: She is not in acute distress.    Appearance: Normal appearance.  Neurological:     Mental Status: She is alert.         Assessment And Plan:     Encounter for annual health examination Assessment & Plan: Behavior modifications discussed and diet history reviewed.   Pt will continue to exercise regularly and modify diet with low GI, plant based foods and decrease intake of processed foods.  Recommend intake of daily multivitamin, Vitamin D , and calcium .  Recommend mammogram for preventive screenings, as well as recommend immunizations that include influenza, TDAP, and Shingles    Diabetes mellitus with stage 3 chronic kidney disease (HCC) Assessment & Plan: A1c is stable, she has had a few increased blood sugars up to 145.   Orders: -     EKG 12-Lead -     POCT URINALYSIS DIP (CLINITEK) -     Microalbumin / creatinine urine ratio -     CMP14+EGFR -     Hemoglobin A1c  Hypertensive nephropathy Assessment & Plan: Blood pressure is well controlled. Continue current medications. 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 Continue current medications  Orders: -     Lipid panel  COVID-19 vaccination declined Assessment & Plan: Declines covid 19 vaccine. Discussed risk of covid 58 and if she changes her mind about the vaccine to call the office. Education has been provided regarding the importance of this vaccine but patient still declined. Advised  may receive this vaccine at local pharmacy or Health Dept.or vaccine clinic. Aware to provide a copy of the vaccination record if obtained from local pharmacy or Health Dept.  Encouraged to take multivitamin, vitamin d , vitamin c and zinc to increase immune system. Aware can call  office if would like to have vaccine here at office. Verbalized acceptance and understanding.     Simple chronic bronchitis (HCC) Assessment & Plan: Stable, continue f/u Pulmonology   Decreased estrogen level Assessment & Plan: Order sent to [facility] for bone density  Orders: -     DG Bone Density; Future  Other long term (current) drug therapy -     CBC with Differential/Platelet     Return for 1 year physical, controlled DM check 4 months. Patient was given opportunity to ask questions. Patient verbalized understanding of the plan and was able to repeat key elements of the plan. All questions were answered to their satisfaction.   Susanna Epley, FNP  I, Susanna Epley, FNP, have reviewed all documentation for this visit. The documentation on 01/05/24 for the exam, diagnosis, procedures, and orders are all accurate and complete.

## 2024-01-05 NOTE — Assessment & Plan Note (Signed)
Behavior modifications discussed and diet history reviewed.   Pt will continue to exercise regularly and modify diet with low GI, plant based foods and decrease intake of processed foods.  Recommend intake of daily multivitamin, Vitamin D, and calcium.  Recommend mammogram for preventive screenings, as well as recommend immunizations that include influenza, TDAP, and Shingles

## 2024-01-05 NOTE — Assessment & Plan Note (Signed)
 Stable, continue f/u Pulmonology

## 2024-01-05 NOTE — Assessment & Plan Note (Signed)
 Order sent to [facility] for bone density

## 2024-01-05 NOTE — Assessment & Plan Note (Signed)

## 2024-01-05 NOTE — Assessment & Plan Note (Signed)
 Cholesterol levels are stable. Continue current medications

## 2024-01-06 LAB — CBC WITH DIFFERENTIAL/PLATELET
Basophils Absolute: 0 10*3/uL (ref 0.0–0.2)
Basos: 1 %
EOS (ABSOLUTE): 0.1 10*3/uL (ref 0.0–0.4)
Eos: 2 %
Hematocrit: 49.6 % — ABNORMAL HIGH (ref 34.0–46.6)
Hemoglobin: 16.2 g/dL — ABNORMAL HIGH (ref 11.1–15.9)
Immature Grans (Abs): 0 10*3/uL (ref 0.0–0.1)
Immature Granulocytes: 0 %
Lymphocytes Absolute: 2.3 10*3/uL (ref 0.7–3.1)
Lymphs: 34 %
MCH: 30.9 pg (ref 26.6–33.0)
MCHC: 32.7 g/dL (ref 31.5–35.7)
MCV: 95 fL (ref 79–97)
Monocytes Absolute: 0.7 10*3/uL (ref 0.1–0.9)
Monocytes: 10 %
Neutrophils Absolute: 3.7 10*3/uL (ref 1.4–7.0)
Neutrophils: 53 %
Platelets: 192 10*3/uL (ref 150–450)
RBC: 5.24 x10E6/uL (ref 3.77–5.28)
RDW: 14.2 % (ref 11.7–15.4)
WBC: 6.9 10*3/uL (ref 3.4–10.8)

## 2024-01-06 LAB — CMP14+EGFR
ALT: 30 IU/L (ref 0–32)
AST: 39 IU/L (ref 0–40)
Albumin: 4.5 g/dL (ref 3.7–4.7)
Alkaline Phosphatase: 81 IU/L (ref 44–121)
BUN/Creatinine Ratio: 14 (ref 12–28)
BUN: 17 mg/dL (ref 8–27)
Bilirubin Total: 1.3 mg/dL — ABNORMAL HIGH (ref 0.0–1.2)
CO2: 27 mmol/L (ref 20–29)
Calcium: 10.3 mg/dL (ref 8.7–10.3)
Chloride: 101 mmol/L (ref 96–106)
Creatinine, Ser: 1.18 mg/dL — ABNORMAL HIGH (ref 0.57–1.00)
Globulin, Total: 2.8 g/dL (ref 1.5–4.5)
Glucose: 130 mg/dL — ABNORMAL HIGH (ref 70–99)
Potassium: 4.4 mmol/L (ref 3.5–5.2)
Sodium: 142 mmol/L (ref 134–144)
Total Protein: 7.3 g/dL (ref 6.0–8.5)
eGFR: 46 mL/min/{1.73_m2} — ABNORMAL LOW (ref >59)

## 2024-01-06 LAB — MICROALBUMIN / CREATININE URINE RATIO
Creatinine, Urine: 65.4 mg/dL
Microalb/Creat Ratio: 24 mg/g{creat} (ref 0–29)
Microalbumin, Urine: 15.7 ug/mL

## 2024-01-06 LAB — HEMOGLOBIN A1C
Est. average glucose Bld gHb Est-mCnc: 171 mg/dL
Hgb A1c MFr Bld: 7.6 % — ABNORMAL HIGH (ref 4.8–5.6)

## 2024-01-06 LAB — LIPID PANEL
Chol/HDL Ratio: 2.5 ratio (ref 0.0–4.4)
Cholesterol, Total: 139 mg/dL (ref 100–199)
HDL: 56 mg/dL (ref >39)
LDL Chol Calc (NIH): 60 mg/dL (ref 0–99)
Triglycerides: 133 mg/dL (ref 0–149)
VLDL Cholesterol Cal: 23 mg/dL (ref 5–40)

## 2024-01-09 ENCOUNTER — Other Ambulatory Visit: Payer: Self-pay

## 2024-01-09 NOTE — Patient Instructions (Signed)
 Visit Information  Thank you for taking time to visit with me today. Please don't hesitate to contact me if I can be of assistance to you before our next scheduled appointment.  Your next care management appointment is by telephone on Friday, June 6 at 12:00 PM  Please call the care guide team at [phone] if you need to cancel, schedule, or reschedule an appointment.   Please call [phone]-TALK (toll free, 24 hour hotline) if you are experiencing a Mental Health or Behavioral Health Crisis or need someone to talk to.  Louanne Roussel RN BSN CCM [place]  [facility], [facility] Nurse Care Coordinator  Direct Dial: [phone] Website: [email or web address]

## 2024-01-09 NOTE — Patient Outreach (Signed)
 Complex Care Management   Visit Note  01/09/2024  Name:  [name] MRN: [id number] DOB: 1941-04-08  Situation: Referral received for Complex Care Management related to COPD and Diabetes with Complications. I obtained verbal consent from Patient.  Visit completed with patient on the phone.   Background:   Past Medical History:  Diagnosis Date   Arthritis    Asthma    COPD (chronic obstructive pulmonary disease) (HCC)    Depression    Diabetes mellitus without complication (HCC)    Diverticulitis    GERD (gastroesophageal reflux disease)    Hyperlipidemia    Hypertension 06/15/2016   left lung ca dx'd 2016   Thyroid  nodule     Assessment: Patient Reported Symptoms:  Cognitive Cognitive Status: Alert and oriented to person, place, and time Cognitive/Intellectual Conditions Management [RPT]: None reported or documented in medical history or problem list      Neurological Neurological Review of Symptoms: No symptoms reported    HEENT HEENT Symptoms Reported: Not assessed      Cardiovascular Cardiovascular Symptoms Reported: No symptoms reported    Respiratory Respiratory Symptoms Reported: Shortness of breath Respiratory Conditions: COPD Respiratory Self-Management Outcome: 4 (good) Respiratory Comment: educated patient about the benefits of using her nebulizer for Albuterol  treatments for best effectiveness. Encouraged patient to contact Dr. Hortense Lyons office to schedule her follow up if she does not hear from the office.  Endocrine Patient reports the following symptoms related to hypoglycemia or hyperglycemia : No symptoms reported Is patient diabetic?: Yes Is patient checking blood sugars at home?: Yes Endocrine Conditions: Diabetes Endocrine Management Strategies: Medication therapy, Routine screening, Diet modification Endocrine Self-Management Outcome: 4 (good)  Gastrointestinal Gastrointestinal Symptoms Reported: No symptoms reported      Genitourinary  Genitourinary Symptoms Reported: No symptoms reported    Integumentary Integumentary Symptoms Reported: No symptoms reported    Musculoskeletal Musculoskelatal Symptoms Reviewed: No symptoms reported        Psychosocial Psychosocial Symptoms Reported: No symptoms reported            01/09/2024   12:38 PM  Depression screen PHQ 2/9  Decreased Interest 0  Down, Depressed, Hopeless 0  PHQ - 2 Score 0    Vitals:   01/09/24 1230  BP: 127/76    Medications Reviewed Today     Reviewed by Kaylene Pascal, RN (Registered Nurse) on 01/09/24 at 1230  Med List Status: <None>   Medication Order Taking? Sig Documenting Provider Last Dose Status Informant  albuterol  (PROVENTIL ) (2.5 MG/3ML) 0.083% nebulizer solution [id number] No Take 3 mLs (2.5 mg total) by nebulization every 6 (six) hours as needed for wheezing or shortness of breath. Parrett, Macdonald Savoy, NP Taking Active   albuterol  (VENTOLIN  HFA) 108 (90 Base) MCG/ACT inhaler [id number] No INHALE 2 PUFFS BY MOUTH EVERY 6 HOURS AS NEEDED FOR WHEEZE OR SHORTNESS OF BREATH NEED SPPT Lind Repine, MD Taking Active   aspirin  81 MG tablet [id number] No Take 81 mg by mouth every evening.  [provider] Taking Active Self  Blood Glucose Monitoring Suppl (TRUE METRIX AIR GLUCOSE METER) w/Device KIT [id number] No Use to check blood sugar 4 times a day. Dx code e11.65 Susanna Epley, FNP Taking Active   Calcium  Carbonate-Vitamin D  600-400 MG-UNIT tablet [id number] No Take 1 tablet by mouth daily.  Patient taking differently: Take 1 tablet by mouth daily. 600 mg of calcium  and 500 units of vitd   Cleave Curling, MD Taking Active   cetirizine (ZYRTEC) 10 MG  tablet [id number] No Take 10 mg by mouth daily. [provider] Taking Active   cycloSPORINE  (RESTASIS ) 0.05 % ophthalmic emulsion [id number] No 1 drop 2 (two) times daily. [provider] Taking Active   donepezil  (ARICEPT ) 10 MG tablet [id number] No TAKE 1 TABLET BY MOUTH EVERY  DAY IN THE Burns Carwin, FNP Taking Active   empagliflozin  (JARDIANCE ) 10 MG TABS tablet [id number] No Take 1 tablet (10 mg total) by mouth daily. Susanna Epley, FNP Taking Active   ezetimibe  (ZETIA ) 10 MG tablet [id number] No TAKE 1 TABLET BY MOUTH EVERY DAY  Patient taking differently: Take 10 mg by mouth at bedtime.   Susanna Epley, FNP Taking Active   fluticasone  [facility]) 50 MCG/ACT nasal spray [id number] No Place into both nostrils daily. [provider] Taking Active   KLOR-CON  M20 20 MEQ tablet [id number] No TAKE 1 TABLET BY MOUTH EVERY DAY WITH FOOD Susanna Epley, FNP Taking Active   lubiprostone  (AMITIZA ) 8 MCG capsule [id number] No TAKE 1 CAPSULE (8 MCG TOTAL) BY MOUTH DAILY WITH BREAKFAST.  Patient taking differently: Take 8 mcg by mouth daily with breakfast. Patient takes every couple of days   Susanna Epley, FNP Taking Active   magnesium oxide (MAG-OX) 400 MG tablet [id number] No Take 400 mg by mouth at bedtime. [provider] Taking Active   Melatonin 2.5 MG CAPS [id number] No Take 2.5 mg by mouth at bedtime as needed (sleep).  [provider] Taking Active Self  mirtazapine  (REMERON ) 15 MG tablet [id number] No TAKE 1 TABLET BY MOUTH EVERYDAY AT BEDTIME Susanna Epley, FNP Taking Active   Multiple Vitamin (MULTIVITAMIN) tablet [id number] No Take 1 tablet by mouth daily. [provider] Taking Active Self  niacin  (VITAMIN B3) 500 MG ER tablet [id number] No TAKE 1 TABLET BY MOUTH EVERY DAY IN THE Burns Carwin, FNP Taking Active   OVER THE COUNTER MEDICATION [id number] No Take 1 tablet by mouth daily. [provider] Taking Active Self           Med Note Bonnie Butters   Thu Jun 23, 2017 11:19 AM) "Mega red 4 in 1"  pantoprazole  (PROTONIX ) 40 MG tablet [id number] No TAKE 1 TABLET BY MOUTH TWICE A DAY Moore, Janece, FNP Taking Active   Probiotic Product (PROBIOTIC PO) [id number] No Take by mouth daily at 6 (six) AM. [provider] Taking Active   rosuvastatin  (CRESTOR ) 10 MG tablet [id number] No TAKE 1 TABLET BY MOUTH EVERY DAY Susanna Epley, FNP Taking Active   TRELEGY ELLIPTA  100-62.5-25 MCG/ACT AEPB [id number] No INHALE 1 PUFF ONE TIME DAILY Lind Repine, MD Taking Active   TRUE METRIX BLOOD GLUCOSE TEST test strip [id number] No USE AS INSTRUCTED TO CHECK BLOOD SUGAR TWICE DAILY Susanna Epley, FNP Taking Active   TRUEplus Lancets 33G MISC [id number] No TEST BLOOD SUGAR AS DIRECTED Susanna Epley, FNP Taking Active   valACYclovir  (VALTREX ) 500 MG tablet [id number] No TAKE 1 TABLET (500 MG TOTAL) BY MOUTH DAILY. Susanna Epley, FNP Taking Active   White Petrolatum -Mineral Oil (SYSTANE NIGHTTIME OP) [id number] No Place 1 drop into both eyes at bedtime as needed (for dry eyes).  [provider] Taking Active Self           Med Note [place] Haddock, Fermin Hove Sep 25, 2018  4:03 PM)    Med List Note Harding Li, [place] 02/22/19 1117): caltrate              Recommendation:  Specialty provider follow-up with Dr. Villa Greaser for COPD evaluation as directed   Follow Up Plan:   Telephone follow up appointment date/time:  Friday, June 6 at 12:00 PM  Louanne Roussel RN BSN CCM [place]  [facility], [facility] Nurse Care Coordinator  Direct Dial: [phone] Website: [email or web address]

## 2024-01-10 ENCOUNTER — Other Ambulatory Visit: Payer: Self-pay | Admitting: Nurse Practitioner

## 2024-01-25 ENCOUNTER — Other Ambulatory Visit: Payer: Self-pay | Admitting: Pulmonary Disease

## 2024-01-31 ENCOUNTER — Telehealth: Payer: Self-pay | Admitting: Nurse Practitioner

## 2024-01-31 NOTE — Telephone Encounter (Unsigned)
 Copied from CRM [id number]. Topic: Clinical - Medication Refill >> Jan 31, 2024 12:25 PM [place] wrote: Medication: valACYclovir  (VALTREX ) 500 MG tablet   Has the patient contacted their pharmacy? No (Agent: If no, request that the patient contact the pharmacy for the refill. If patient does not wish to contact the pharmacy document the reason why and proceed with request.) (Agent: If yes, when and what did the pharmacy advise?)  This is the patient's preferred pharmacy:  [facility] [phone] Jonette Nestle, [place] - [address] [place] [address] Phone: [phone] Fax: [phone]  Is this the correct pharmacy for this prescription? Yes If no, delete pharmacy and type the correct one.   Has the prescription been filled recently? Yes  Is the patient out of the medication? Yes  Has the patient been seen for an appointment in the last year OR does the patient have an upcoming appointment? Yes  Can we respond through MyChart? Yes  Agent: Please be advised that Rx refills may take up to 3 business days. We ask that you follow-up with your pharmacy.

## 2024-02-01 MED ORDER — VALACYCLOVIR HCL 500 MG PO TABS: 500.0000 mg | ORAL_TABLET | Freq: Every day | ORAL | 0 refills | Status: DC

## 2024-02-10 ENCOUNTER — Other Ambulatory Visit: Payer: Self-pay

## 2024-02-10 DIAGNOSIS — J441 Chronic obstructive pulmonary disease with (acute) exacerbation: Secondary | ICD-10-CM

## 2024-02-10 DIAGNOSIS — E1169 Type 2 diabetes mellitus with other specified complication: Secondary | ICD-10-CM

## 2024-02-10 NOTE — Patient Instructions (Signed)
 Visit Information  Thank you for taking time to visit with me today. Please don't hesitate to contact me if I can be of assistance to you before our next scheduled appointment.  Your next care management appointment is by telephone on Friday, July 11 at 1:30 PM  Please call the care guide team at [phone] if you need to cancel, schedule, or reschedule an appointment.   Please call 1-800-273-TALK (toll free, 24 hour hotline) if you are experiencing a Mental Health or Behavioral Health Crisis or need someone to talk to.  Louanne Roussel RN BSN CCM [place]  [facility], [facility] Health Nurse Care Coordinator  Direct Dial: [phone] Website: [email or web address]

## 2024-02-10 NOTE — Patient Outreach (Signed)
 Complex Care Management   Visit Note  02/10/2024  Name:  [name] MRN: [id number] DOB: 08/20/1941  Situation: Referral received for Complex Care Management related to COPD and Diabetes with Complications. I obtained verbal consent from Patient.  Visit completed with patient on the phone.  Background:   Past Medical History:  Diagnosis Date   Arthritis    Asthma    COPD (chronic obstructive pulmonary disease) (HCC)    Depression    Diabetes mellitus without complication (HCC)    Diverticulitis    GERD (gastroesophageal reflux disease)    Hyperlipidemia    Hypertension 06/15/2016   left lung ca dx'd 2016   Thyroid  nodule     Assessment: Patient Reported Symptoms:  Cognitive Cognitive Status: Alert and oriented to person, place, and time Cognitive/Intellectual Conditions Management [RPT]: None reported or documented in medical history or problem list   Health Maintenance Behaviors: Exercise, Healthy diet, Annual physical exam, Sleep adequate Health Facilitated by: Healthy diet, Rest  Neurological Neurological Review of Symptoms: No symptoms reported    HEENT HEENT Symptoms Reported: No symptoms reported      Cardiovascular Cardiovascular Symptoms Reported: No symptoms reported Does patient have uncontrolled Hypertension?: No Cardiovascular Conditions: Hypertension, High blood cholesterol (Aortic Atherosclerosis) Cardiovascular Management Strategies: Medication therapy, Routine screening, Diet modification, Exercise Cardiovascular Self-Management Outcome: 4 (good)  Respiratory Respiratory Symptoms Reported: Shortness of breath Respiratory Conditions: COPD, Shortness of breath, Seasonal allergies Respiratory Self-Management Outcome: 4 (good)  Endocrine Patient reports the following symptoms related to hypoglycemia or hyperglycemia : No symptoms reported Is patient diabetic?: Yes Is patient checking blood sugars at home?: Yes Endocrine Conditions: Diabetes Endocrine  Management Strategies: Exercise, Diet modification, Routine screening, Medication therapy, Medical device Endocrine Self-Management Outcome: 4 (good)  Gastrointestinal Gastrointestinal Symptoms Reported: No symptoms reported      Genitourinary Genitourinary Symptoms Reported: No symptoms reported    Integumentary Integumentary Symptoms Reported: No symptoms reported    Musculoskeletal Musculoskelatal Symptoms Reviewed: No symptoms reported        Psychosocial Psychosocial Symptoms Reported: No symptoms reported            02/10/2024   12:18 PM  Depression screen PHQ 2/9  Decreased Interest 0  Down, Depressed, Hopeless 0  PHQ - 2 Score 0    There were no vitals filed for this visit.  Medications Reviewed Today     Reviewed by Kaylene Pascal, RN (Registered Nurse) on 02/10/24 at 1209  Med List Status: <None>   Medication Order Taking? Sig Documenting Provider Last Dose Status Informant  albuterol  (PROVENTIL ) (2.5 MG/3ML) 0.083% nebulizer solution [id number] No Take 3 mLs (2.5 mg total) by nebulization every 6 (six) hours as needed for wheezing or shortness of breath. Parrett, Macdonald Savoy, NP Taking Active   albuterol  (VENTOLIN  HFA) 108 (90 Base) MCG/ACT inhaler [id number]  INHALE 2 PUFFS BY MOUTH EVERY 6 HOURS AS NEEDED FOR WHEEZE OR SHORTNESS OF BREATH NEED SPPT Lind Repine, MD  Active   aspirin  81 MG tablet [id number] No Take 81 mg by mouth every evening.  [provider] Taking Active Self  Blood Glucose Monitoring Suppl (TRUE METRIX AIR GLUCOSE METER) w/Device KIT [id number] No Use to check blood sugar 4 times a day. Dx code e11.65 Susanna Epley, FNP Taking Active   Calcium  Carbonate-Vitamin D  600-400 MG-UNIT tablet [id number] No Take 1 tablet by mouth daily.  Patient taking differently: Take 1 tablet by mouth daily. 600 mg of calcium  and 500 units of vitd  Cleave Curling, MD Taking Active   cetirizine (ZYRTEC) 10 MG tablet [id number] No Take 10 mg by mouth daily.  [provider] Taking Active   cycloSPORINE  (RESTASIS ) 0.05 % ophthalmic emulsion [id number] No 1 drop 2 (two) times daily. [provider] Taking Active   donepezil  (ARICEPT ) 10 MG tablet [id number] No TAKE 1 TABLET BY MOUTH EVERY DAY IN THE Burns Carwin, FNP Taking Active   empagliflozin  (JARDIANCE ) 10 MG TABS tablet [id number] No Take 1 tablet (10 mg total) by mouth daily. Susanna Epley, FNP Taking Active   ezetimibe  (ZETIA ) 10 MG tablet [id number] No TAKE 1 TABLET BY MOUTH EVERY DAY  Patient taking differently: Take 10 mg by mouth at bedtime.   Susanna Epley, FNP Taking Active   fluticasone  [facility]) 50 MCG/ACT nasal spray [id number] No Place into both nostrils daily. [provider] Taking Active   KLOR-CON  M20 20 MEQ tablet [id number] No TAKE 1 TABLET BY MOUTH EVERY DAY WITH FOOD Susanna Epley, FNP Taking Active   lubiprostone  (AMITIZA ) 8 MCG capsule [id number] No TAKE 1 CAPSULE (8 MCG TOTAL) BY MOUTH DAILY WITH BREAKFAST.  Patient taking differently: Take 8 mcg by mouth daily with breakfast. Patient takes every couple of days   Susanna Epley, FNP Taking Active   magnesium oxide (MAG-OX) 400 MG tablet [id number] No Take 400 mg by mouth at bedtime. [provider] Taking Active   Melatonin 2.5 MG CAPS [id number] No Take 2.5 mg by mouth at bedtime as needed (sleep).  [provider] Taking Active Self  mirtazapine  (REMERON ) 15 MG tablet [id number] No TAKE 1 TABLET BY MOUTH EVERYDAY AT BEDTIME Susanna Epley, FNP Taking Active   Multiple Vitamin (MULTIVITAMIN) tablet [id number] No Take 1 tablet by mouth daily. [provider] Taking Active Self  niacin  (VITAMIN B3) 500 MG ER tablet [id number] No TAKE 1 TABLET BY MOUTH EVERY DAY IN THE Burns Carwin, FNP Taking Active   OVER THE COUNTER MEDICATION [id number] No Take 1 tablet by mouth daily. [provider] Taking Active Self           Med Note Bonnie Butters   Thu Jun 23, 2017  11:19 AM) "Mega red 4 in 1"  pantoprazole  (PROTONIX ) 40 MG tablet [id number] No TAKE 1 TABLET BY MOUTH TWICE A DAY Moore, Janece, FNP Taking Active   Probiotic Product (PROBIOTIC PO) [id number] No Take by mouth daily at 6 (six) AM. [provider] Taking Active   rosuvastatin  (CRESTOR ) 10 MG tablet [id number] No TAKE 1 TABLET BY MOUTH EVERY DAY Susanna Epley, FNP Taking Active   TRELEGY ELLIPTA  100-62.5-25 MCG/ACT AEPB [id number] No INHALE 1 PUFF ONE TIME DAILY Lind Repine, MD Taking Active   TRUE METRIX BLOOD GLUCOSE TEST test strip [id number] No USE AS INSTRUCTED TO CHECK BLOOD SUGAR TWICE DAILY Susanna Epley, FNP Taking Active   TRUEplus Lancets 33G MISC [id number]  TEST BLOOD SUGAR AS DIRECTED Susanna Epley, FNP  Active   valACYclovir  (VALTREX ) 500 MG tablet [id number]  Take 1 tablet (500 mg total) by mouth daily. Susanna Epley, FNP  Active   White Petrolatum -Mineral Oil (SYSTANE NIGHTTIME OP) [id number] No Place 1 drop into both eyes at bedtime as needed (for dry eyes).  [provider] Taking Active Self           Med Note Wauneta Haddock, Fermin Hove Sep 25, 2018  4:03 PM)    Med List Note Harding Li, CMA 02/22/19 1117): caltrate  Recommendation:   PCP Follow-up  Follow Up Plan:   Telephone follow up appointment date/time:  Friday, July 11 at 1:30 PM Referral to Pharmacist  Louanne Roussel RN BSN CCM [place]  [facility], [facility] Nurse Care Coordinator  Direct Dial: [phone] Website: [email or web address]

## 2024-02-16 ENCOUNTER — Telehealth: Payer: Self-pay | Admitting: Pharmacist

## 2024-02-16 DIAGNOSIS — N183 Chronic kidney disease, stage 3 unspecified: Secondary | ICD-10-CM

## 2024-02-16 NOTE — Progress Notes (Signed)
 02/16/2024 Name: Freeda J Urieta MRN: [id number] DOB: 1941-08-17  Chief Complaint  Patient presents with   Medication Management    Diabetes     ALLIANNA BEAUBIEN is a 83 y.o. year old female who presented for a telephone visit.   They were referred to the pharmacist by their PCP for assistance in managing diabetes.    Subjective: Shaniyah Belt is an 83 year old female with multiple medical conditions including but not limited to:  arthritis, asthma, COPD, hypertension, type 2 diabetes, GERD, depression and a history of lung cancer.  She was referred for diabetes management because her HgA1c increased to 7.6 from 6.9%.  Care Team: Primary Care Provider: Susanna Epley, FNP ; Next Scheduled Visit: 05/09/2024  Medication Access/Adherence  Current Pharmacy:  [id number], [place] - [address] [place] [address] Phone: [phone] Fax: [phone]  [facility] - [place], [place] - [address] [place] [address] Phone: [phone] Fax: [phone]   Patient reports affordability concerns with their medications: No  Patient reports access/transportation concerns to their pharmacy: No  Patient reports adherence concerns with their medications:  No      Diabetes:  Current medications:  Jardiance  10 mg 1 tablet daily last filled 01/31/24 #30  Current glucose readings: AM only  601,09,323,[phone]     Patient denies hypoglycemic s/sx including  dizziness, shakiness, sweating. Patient denies hyperglycemic symptoms including  polyuria, polydipsia, polyphagia, nocturia, neuropathy, blurred vision.  Current meal patterns:  -Patient has worked with a Health and safety inspector and said she is trying to balance her meals. She did report doing some night time snacking.Snacking could be the cause of the increase in HgA1c  On statin therapy Rosuvastatin  10 mg last filled 01/07/24 90 day  supply   Objective:  Lab Results  Component Value Date   HGBA1C 7.6 (H) 01/05/2024    Lab Results  Component Value Date   CREATININE 1.18 (H) 01/05/2024   BUN 17 01/05/2024   NA 142 01/05/2024   K 4.4 01/05/2024   CL 101 01/05/2024   CO2 27 01/05/2024    Lab Results  Component Value Date   CHOL 139 01/05/2024   HDL 56 01/05/2024   LDLCALC 60 01/05/2024   TRIG 133 01/05/2024   CHOLHDL 2.5 01/05/2024    Medications Reviewed Today     Reviewed by Geronimo Krabbe, RPH (Pharmacist) on 02/16/24 at 1133  Med List Status: <None>   Medication Order Taking? Sig Documenting Provider Last Dose Status Informant  albuterol  (PROVENTIL ) (2.5 MG/3ML) 0.083% nebulizer solution [id number] Yes Take 3 mLs (2.5 mg total) by nebulization every 6 (six) hours as needed for wheezing or shortness of breath. Parrett, Macdonald Savoy, NP  Active   albuterol  (VENTOLIN  HFA) 108 (90 Base) MCG/ACT inhaler [id number] Yes INHALE 2 PUFFS BY MOUTH EVERY 6 HOURS AS NEEDED FOR WHEEZE OR SHORTNESS OF BREATH NEED SPPT Lind Repine, MD  Active   aspirin  81 MG tablet [id number] Yes Take 81 mg by mouth every evening.  [provider]  Active Self  Blood Glucose Monitoring Suppl (TRUE METRIX AIR GLUCOSE METER) w/Device KIT [id number] Yes Use to check blood sugar 4 times a day. Dx code e11.65 Susanna Epley, FNP  Active   Calcium  Carbonate-Vitamin D  600-400 MG-UNIT tablet [id number] Yes Take 1 tablet by mouth daily. Cleave Curling, MD  Active   cetirizine (ZYRTEC) 10 MG tablet [id number] Yes Take 10 mg by  mouth daily. [provider]  Active   cycloSPORINE  (RESTASIS ) 0.05 % ophthalmic emulsion [id number] Yes 1 drop 2 (two) times daily. [provider]  Active   donepezil  (ARICEPT ) 10 MG tablet [id number] Yes TAKE 1 TABLET BY MOUTH EVERY DAY IN THE Byrd Cast, Abelino Able, FNP  Active   empagliflozin  (JARDIANCE ) 10 MG TABS tablet [id number] Yes Take 1 tablet (10 mg total) by mouth daily. Susanna Epley, FNP   Active   ezetimibe  (ZETIA ) 10 MG tablet [id number] Yes TAKE 1 TABLET BY MOUTH EVERY DAY Susanna Epley, FNP  Active   fluticasone  [facility]) 50 MCG/ACT nasal spray [id number] Yes Place into both nostrils daily. [provider]  Active   KLOR-CON  M20 20 MEQ tablet [id number] Yes TAKE 1 TABLET BY MOUTH EVERY DAY WITH FOOD Susanna Epley, FNP  Active   lubiprostone  (AMITIZA ) 8 MCG capsule [id number] Yes TAKE 1 CAPSULE (8 MCG TOTAL) BY MOUTH DAILY WITH BREAKFAST. Susanna Epley, FNP  Active   magnesium oxide (MAG-OX) 400 MG tablet [id number] Yes Take 400 mg by mouth at bedtime. [provider]  Active   Melatonin 2.5 MG CAPS [id number] Yes Take 2.5 mg by mouth at bedtime as needed (sleep).  [provider]  Active Self  mirtazapine  (REMERON ) 15 MG tablet [id number] Yes TAKE 1 TABLET BY MOUTH EVERYDAY AT BEDTIME Susanna Epley, FNP  Active   Multiple Vitamin (MULTIVITAMIN) tablet [id number] Yes Take 1 tablet by mouth daily. [provider]  Active Self  niacin  (VITAMIN B3) 500 MG ER tablet [id number] Yes TAKE 1 TABLET BY MOUTH EVERY DAY IN THE Burns Carwin, FNP  Active   OVER THE COUNTER MEDICATION [id number] Yes Take 1 tablet by mouth daily. [provider]  Active Self           Med Note Bonnie Butters   Thu Jun 23, 2017 11:19 AM) Mega red 4 in 1  pantoprazole  (PROTONIX ) 40 MG tablet [id number] Yes TAKE 1 TABLET BY MOUTH TWICE A DAY Moore, Janece, FNP  Active   Probiotic Product (PROBIOTIC PO) [id number] Yes Take by mouth daily at 6 (six) AM. [provider]  Active   rosuvastatin  (CRESTOR ) 10 MG tablet [id number] Yes TAKE 1 TABLET BY MOUTH EVERY DAY Susanna Epley, FNP  Active   TRELEGY ELLIPTA  100-62.5-25 MCG/ACT AEPB [id number] Yes INHALE 1 PUFF ONE TIME DAILY Lind Repine, MD  Active   TRUE METRIX BLOOD GLUCOSE TEST test strip [id number] Yes USE AS INSTRUCTED TO CHECK BLOOD SUGAR TWICE DAILY Susanna Epley, FNP  Active   TRUEplus Lancets 33G MISC  [id number] Yes TEST BLOOD SUGAR AS DIRECTED Susanna Epley, FNP  Active   valACYclovir  (VALTREX ) 500 MG tablet [id number] Yes Take 1 tablet (500 mg total) by mouth daily. Susanna Epley, FNP  Active   White Petrolatum -Mineral Oil (SYSTANE NIGHTTIME OP) [id number] Yes Place 1 drop into both eyes at bedtime as needed (for dry eyes).  [provider]  Active Self           Med Note Wauneta Haddock, Fermin Hove Sep 25, 2018  4:03 PM)    Med List Note Harding Li, [place] 02/22/19 1117): caltrate                Assessment/Plan:   Diabetes: - Currently controlled HgA1c 7.6% up from 6.9% - Reviewed long term cardiovascular and renal outcomes of uncontrolled blood sugar - Reviewed goal A1c, goal fasting, and goal 2 hour post prandial glucose--Patient is >87 years old  so very tight glucose control may not be necessary - Recommend to continue current therapy as patient made it very clear is was/is not interested in starting any new medications. She said she never took Rybelsus  because she read the side effects.  - Recommend to check glucose in the morning and 2 hours after a meal .     Follow Up Plan:   Follow up with the Patient in 1 week on blood sugars.   Geronimo Krabbe, PharmD, BCACP Clinical Pharmacist [phone]

## 2024-02-23 ENCOUNTER — Telehealth: Payer: Self-pay | Admitting: Pharmacist

## 2024-02-23 DIAGNOSIS — E1122 Type 2 diabetes mellitus with diabetic chronic kidney disease: Secondary | ICD-10-CM

## 2024-02-23 DIAGNOSIS — H35033 Hypertensive retinopathy, bilateral: Secondary | ICD-10-CM | POA: Diagnosis not present

## 2024-02-23 DIAGNOSIS — H35362 Drusen (degenerative) of macula, left eye: Secondary | ICD-10-CM | POA: Diagnosis not present

## 2024-02-23 DIAGNOSIS — E119 Type 2 diabetes mellitus without complications: Secondary | ICD-10-CM | POA: Diagnosis not present

## 2024-02-23 DIAGNOSIS — H524 Presbyopia: Secondary | ICD-10-CM | POA: Diagnosis not present

## 2024-02-23 DIAGNOSIS — H04123 Dry eye syndrome of bilateral lacrimal glands: Secondary | ICD-10-CM | POA: Diagnosis not present

## 2024-02-23 LAB — HM DIABETES EYE EXAM

## 2024-02-23 NOTE — Progress Notes (Signed)
 02/23/2024 Name: [name] MRN: [id number] DOB: [birth year]  Chief Complaint  Patient presents with   Medication Management    Diabetes     [name] is a 83 y.o. year old female who presented for a telephone visit.   They were referred to the pharmacist by their Case Management Team  for assistance in managing diabetes.    Subjective:  Care Team: Primary Care Provider: Susanna Epley, FNP ; Next Scheduled Visit: 05/09/2024 [facility] Nurse Visit-03/16/24  Medication Access/Adherence  Current Pharmacy:  [id number], [place] - [address] [place] [address] Phone: [phone] Fax: [phone]  [facility] - [place], [place] - [address] [place] [address] Phone: [phone] Fax: [phone]   Patient reports affordability concerns with their medications: No  Patient reports access/transportation concerns to their pharmacy: No  Patient reports adherence concerns with their medications:  No      Diabetes:  Current medications:  Jardiance  10 mg  Patient denies hypoglycemic s/sx including  dizziness, shakiness, sweating. Patient denies hyperglycemic symptoms including  polyuria, polydipsia, polyphagia, nocturia, neuropathy, blurred vision.  Current meal patterns:  - Brunch: peanut butter raisin and cinnamon bread, orange or a banana - Supper Salad, piece of fruit afterwards - Snacks crackers, cheese, 1/2 cup of ice cream (very seldom) - Drinks ice flavored water  Current physical activity: walks some through the day, works in the yard  Current medication access support:  Is dually-eligible  Hyperlipidemia/ASCVD Risk Reduction  Current lipid lowering medications:  Ezetimibe  10 mg daily 01/07/24 90 day Niacin  500 mg 1 tablet daily Rosuvastatin  10 mg 1 tablet daily 01/31/24 90 day  Antiplatelet regimen:  Aspirin  81 mg     Clinical ASCVD: No  The  ASCVD Risk score (Arnett DK, et al., 2019) failed to calculate for the following reasons:   The 2019 ASCVD risk score is only valid for ages 43 to 39      Objective:  Lab Results  Component Value Date   HGBA1C 7.6 (H) 01/05/2024    Lab Results  Component Value Date   CREATININE 1.18 (H) 01/05/2024   BUN 17 01/05/2024   NA 142 01/05/2024   K 4.4 01/05/2024   CL 101 01/05/2024   CO2 27 01/05/2024    Lab Results  Component Value Date   CHOL 139 01/05/2024   HDL 56 01/05/2024   LDLCALC 60 01/05/2024   TRIG 133 01/05/2024   CHOLHDL 2.5 01/05/2024    Medications Reviewed Today     Reviewed by Geronimo Krabbe, RPH (Pharmacist) on 02/23/24 at 1753  Med List Status: <None>   Medication Order Taking? Sig Documenting Provider Last Dose Status Informant  albuterol  (PROVENTIL ) (2.5 MG/3ML) 0.083% nebulizer solution [id number] Yes Take 3 mLs (2.5 mg total) by nebulization every 6 (six) hours as needed for wheezing or shortness of breath. Parrett, Macdonald Savoy, NP  Active   albuterol  (VENTOLIN  HFA) 108 (90 Base) MCG/ACT inhaler [id number] Yes INHALE 2 PUFFS BY MOUTH EVERY 6 HOURS AS NEEDED FOR WHEEZE OR SHORTNESS OF BREATH NEED SPPT Lind Repine, MD  Active   aspirin  81 MG tablet [id number] Yes Take 81 mg by mouth every evening.  [provider]  Active Self  Blood Glucose Monitoring Suppl (TRUE METRIX AIR GLUCOSE METER) w/Device KIT [id number] Yes Use to check blood sugar 4 times a day. Dx code F09.32 Susanna Epley, FNP  Active  Calcium  Carbonate-Vitamin D  600-400 MG-UNIT tablet [id number] Yes Take 1 tablet by mouth daily. Cleave Curling, MD  Active   cetirizine (ZYRTEC) 10 MG tablet [id number] Yes Take 10 mg by mouth daily. [provider]  Active   cycloSPORINE  (RESTASIS ) 0.05 % ophthalmic emulsion [id number] Yes 1 drop 2 (two) times daily. [provider]  Active   donepezil  (ARICEPT ) 10 MG tablet [id number] Yes TAKE 1 TABLET BY MOUTH EVERY DAY IN THE Byrd Cast,  Abelino Able, FNP  Active   empagliflozin  (JARDIANCE ) 10 MG TABS tablet [id number] Yes Take 1 tablet (10 mg total) by mouth daily. Susanna Epley, FNP  Active   ezetimibe  (ZETIA ) 10 MG tablet [id number] Yes TAKE 1 TABLET BY MOUTH EVERY DAY Susanna Epley, FNP  Active   fluticasone  [facility]) 50 MCG/ACT nasal spray [id number] Yes Place into both nostrils daily. [provider]  Active   KLOR-CON  M20 20 MEQ tablet [id number] Yes TAKE 1 TABLET BY MOUTH EVERY DAY WITH FOOD Susanna Epley, FNP  Active   lubiprostone  (AMITIZA ) 8 MCG capsule [id number] Yes TAKE 1 CAPSULE (8 MCG TOTAL) BY MOUTH DAILY WITH BREAKFAST. Susanna Epley, FNP  Active   magnesium oxide (MAG-OX) 400 MG tablet [id number] Yes Take 400 mg by mouth at bedtime. [provider]  Active   Melatonin 2.5 MG CAPS [id number] Yes Take 2.5 mg by mouth at bedtime as needed (sleep).  [provider]  Active Self  mirtazapine  (REMERON ) 15 MG tablet [id number] Yes TAKE 1 TABLET BY MOUTH EVERYDAY AT BEDTIME Susanna Epley, FNP  Active   Multiple Vitamin (MULTIVITAMIN) tablet [id number] Yes Take 1 tablet by mouth daily. [provider]  Active Self  niacin  (VITAMIN B3) 500 MG ER tablet [id number] Yes TAKE 1 TABLET BY MOUTH EVERY DAY IN THE Burns Carwin, FNP  Active   OVER THE COUNTER MEDICATION [id number] Yes Take 1 tablet by mouth daily. [provider]  Active Self           Med Note Bonnie Butters   Thu Jun 23, 2017 11:19 AM) Mega red 4 in 1  pantoprazole  (PROTONIX ) 40 MG tablet [id number] Yes TAKE 1 TABLET BY MOUTH TWICE A DAY Moore, Janece, FNP  Active   Probiotic Product (PROBIOTIC PO) [id number] Yes Take by mouth daily at 6 (six) AM. [provider]  Active   rosuvastatin  (CRESTOR ) 10 MG tablet [id number] Yes TAKE 1 TABLET BY MOUTH EVERY DAY Susanna Epley, FNP  Active   TRELEGY ELLIPTA  100-62.5-25 MCG/ACT AEPB [id number] Yes INHALE 1 PUFF ONE TIME DAILY Lind Repine, MD  Active   TRUE METRIX BLOOD  GLUCOSE TEST test strip [id number] Yes USE AS INSTRUCTED TO CHECK BLOOD SUGAR TWICE DAILY Susanna Epley, FNP  Active   TRUEplus Lancets 33G MISC [id number] Yes TEST BLOOD SUGAR AS DIRECTED Susanna Epley, FNP  Active   valACYclovir  (VALTREX ) 500 MG tablet [id number] Yes Take 1 tablet (500 mg total) by mouth daily. Susanna Epley, FNP  Active   White Petrolatum -Mineral Oil (SYSTANE NIGHTTIME OP) [id number] Yes Place 1 drop into both eyes at bedtime as needed (for dry eyes).  [provider]  Active Self           Med Note Jacob Master Sep 25, 2018  4:03 PM)    Med List Note Harding Li, [place] 02/22/19 1117): caltrate               Assessment/Plan:   Diabetes: - Currently controlled A1c 7.6% increased  from 7%  -Recommend Patient check her blood sugars at least twice per day for 1 week and write them down.  I will follow up with her in 1 week to look for trends.  General ADA Recommendations for Older Adults:  1. Healthy Older Adults Characteristics: Few coexisting chronic illnesses, intact cognitive and functional status.  A1C Goal: <7.5%  2. Complex/Intermediate Health Characteristics: Multiple coexisting chronic illnesses, or mild-to-moderate cognitive impairment or functional dependency.  A1C Goal: <8.0%  3. Very Complex/Poor Health Characteristics: Long-term care or end-stage chronic illnesses, moderate-to-severe cognitive impairment, or functional dependency.  A1C Goal: <8.5% or avoid hypoglycemia more than target A1C  Tight glycemic control is generally not recommended if it increases the risk of hypoglycemia, falls, or worsens quality of life.  Focus is placed on symptom management and avoidance of hypoglycemia.  Follow Up Plan:    Follow up with Patient in 1 week to check on blood sugars.   Geronimo Krabbe, PharmD, BCACP Clinical Pharmacist [phone]

## 2024-02-24 ENCOUNTER — Other Ambulatory Visit: Payer: Self-pay

## 2024-02-24 MED ORDER — TRUE METRIX BLOOD GLUCOSE TEST VI STRP: ORAL_STRIP | 3 refills | Status: AC

## 2024-02-27 ENCOUNTER — Ambulatory Visit: Payer: Self-pay | Admitting: Nurse Practitioner

## 2024-03-01 ENCOUNTER — Telehealth: Payer: Self-pay | Admitting: Pharmacist

## 2024-03-01 DIAGNOSIS — E1122 Type 2 diabetes mellitus with diabetic chronic kidney disease: Secondary | ICD-10-CM

## 2024-03-01 NOTE — Progress Notes (Signed)
   03/01/2024  Patient ID: [name], female   DOB: November 23, 1940, 83 y.o.   MRN: [id number]  Patient was called to follow up on blood sugars. Unfortunately, she did not answer her phone. HIPAA compliant message was left on her voicemail.    A1c- 7.6%   Plan: Call patient back in 10-14 business days.   Cassius DOROTHA Brought, PharmD, BCACP Clinical Pharmacist [phone]

## 2024-03-06 ENCOUNTER — Other Ambulatory Visit: Payer: Self-pay | Admitting: Nurse Practitioner

## 2024-03-06 NOTE — Telephone Encounter (Unsigned)
 Copied from CRM [id number]. Topic: Clinical - Medication Refill >> Mar 06, 2024 12:37 PM Everette C wrote: Medication: pantoprazole  (PROTONIX ) 40 MG tablet   Has the patient contacted their pharmacy? Yes (Agent: If no, request that the patient contact the pharmacy for the refill. If patient does not wish to contact the pharmacy document the reason why and proceed with request.) (Agent: If yes, when and what did the pharmacy advise?)  This is the patient's preferred pharmacy:  [phone] [place] [facility] [place] - [address] [place] [address] Phone: [phone] Fax: [phone]  Is this the correct pharmacy for this prescription? Yes If no, delete pharmacy and type the correct one.   Has the prescription been filled recently? Yes  Is the patient out of the medication? No  Has the patient been seen for an appointment in the last year OR does the patient have an upcoming appointment? Yes  Can we respond through MyChart? No  Agent: Please be advised that Rx refills may take up to 3 business days. We ask that you follow-up with your pharmacy.

## 2024-03-07 MED ORDER — PANTOPRAZOLE SODIUM 40 MG PO TBEC: 40.0000 mg | DELAYED_RELEASE_TABLET | Freq: Two times a day (BID) | ORAL | 1 refills | Status: AC

## 2024-03-13 ENCOUNTER — Other Ambulatory Visit (HOSPITAL_BASED_OUTPATIENT_CLINIC_OR_DEPARTMENT_OTHER): Payer: Self-pay

## 2024-03-13 ENCOUNTER — Other Ambulatory Visit: Payer: Self-pay | Admitting: Pulmonary Disease

## 2024-03-13 MED ORDER — ALBUTEROL SULFATE HFA 108 (90 BASE) MCG/ACT IN AERS
2.0000 | INHALATION_SPRAY | Freq: Four times a day (QID) | RESPIRATORY_TRACT | 3 refills | Status: AC | PRN
Start: 2024-03-13 — End: ?

## 2024-03-16 ENCOUNTER — Telehealth: Payer: Self-pay

## 2024-03-20 ENCOUNTER — Other Ambulatory Visit: Payer: Self-pay | Admitting: Nurse Practitioner

## 2024-03-20 ENCOUNTER — Telehealth: Payer: Self-pay

## 2024-03-20 ENCOUNTER — Telehealth: Payer: Self-pay | Admitting: Pharmacist

## 2024-03-20 DIAGNOSIS — G4700 Insomnia, unspecified: Secondary | ICD-10-CM

## 2024-03-20 DIAGNOSIS — E1122 Type 2 diabetes mellitus with diabetic chronic kidney disease: Secondary | ICD-10-CM

## 2024-03-20 NOTE — Progress Notes (Signed)
 Complex Care Management Note  Care Guide Note 03/20/2024 Name: [name] MRN: [id number] DOB: [birth year]  [name] is a 83 y.o. year old female who sees Georgina Speaks, FNP for primary care. I reached out to Yecenia J Diggins by phone today to offer complex care management services.  [name] was given information about Complex Care Management services today including:   The Complex Care Management services include support from the care team which includes your Nurse Care Manager, Clinical Social Worker, or Pharmacist.  The Complex Care Management team is here to help remove barriers to the health concerns and goals most important to you. Complex Care Management services are voluntary, and the patient may decline or stop services at any time by request to their care team member.   Complex Care Management Consent Status: Patient agreed to services and verbal consent obtained.   Follow up plan:  Telephone appointment with complex care management team member scheduled for:  04/20/24 @ 2 pm  Encounter Outcome:  Patient Scheduled  Leotis Rase [facility], [facility] Guide  Direct Dial: [phone]  Fax [phone]

## 2024-03-20 NOTE — Progress Notes (Addendum)
   03/20/2024  Patient ID: [name], female   DOB: 08-27-1941, 83 y.o.   MRN: [id number]  Patient was called to follow up on diabetes after she had a small bump in her HgA1c to 7.6 from 6.9%. Unfortunately, she did not answer her phone. HIPAA compliant message was left on her voicemail.   On statin therapy Rosuvastatin  10 mg last filled 01/07/24 90 day supply -SUPD closed.   Cassius DOROTHA Brought, PharmD, BCACP Clinical Pharmacist [phone]     Plan: Call Patient back in 1 month.   Cassius DOROTHA Brought, PharmD, BCACP Clinical Pharmacist [phone]   ADDENDUM  Patient called me back. HIPAA identifiers were obtained.  Patient reported she has been feeling well.  [name] is an 83 year old female with multiple medical conditions including but not limited to:  arthritis, asthma, COPD, hypertension, type 2 diabetes, GERD, depression and a history of lung cancer.  She was referred for diabetes management because her HgA1c increased to 7.6 from 6.9%.   LDL 60 - on statin therapy egfr 46 ml/min  She reported the following fasting morning blood sugars:  Highest 131  03/09/24-112 mg/dl 92/94/74-871   92/93/74- 123   03/12/24 -121 03/13/24-126 03/14/24-129 03/15/24-106 03/16/24-123 03/17/24-124 03/18/24-133 03/19/24-131 07/15/251127  Upcoming appointment:  05/09/24  Recommended to try checking her blood sugar 2 hours after she eats at least once per week.   Plan: Call Patient back in 1 month.  Cassius DOROTHA Brought, PharmD, BCACP Clinical Pharmacist [phone]

## 2024-03-20 NOTE — Progress Notes (Signed)
 Complex Care Management Care Guide Note  03/20/2024 Name: MEHER KUCINSKI MRN: [id number] DOB: 1941-01-31  Charniece J Mastel is a 83 y.o. year old female who is a primary care patient of Georgina Speaks, FNP and is actively engaged with the care management team. I reached out to Ciena J Detert by phone today to assist with re-scheduling  with the RN Case Manager.  Follow up plan: Unsuccessful telephone outreach attempt made. A HIPAA compliant phone message was left for the patient providing contact information and requesting a return call.  Leotis Rase [facility], [facility] Guide  Direct Dial: [phone]  Fax [phone]

## 2024-03-26 ENCOUNTER — Other Ambulatory Visit: Payer: Self-pay | Admitting: Nurse Practitioner

## 2024-03-26 DIAGNOSIS — E1122 Type 2 diabetes mellitus with diabetic chronic kidney disease: Secondary | ICD-10-CM

## 2024-03-30 ENCOUNTER — Other Ambulatory Visit: Payer: Self-pay | Admitting: Nurse Practitioner

## 2024-04-05 ENCOUNTER — Other Ambulatory Visit: Payer: Self-pay | Admitting: Nurse Practitioner

## 2024-04-05 DIAGNOSIS — R6881 Early satiety: Secondary | ICD-10-CM

## 2024-04-17 ENCOUNTER — Other Ambulatory Visit: Payer: Self-pay | Admitting: Nurse Practitioner

## 2024-04-17 MED ORDER — VALACYCLOVIR HCL 500 MG PO TABS: 500.0000 mg | ORAL_TABLET | Freq: Every day | ORAL | 0 refills | Status: DC

## 2024-04-17 NOTE — Telephone Encounter (Signed)
 Copied from CRM #[id number]. Topic: Clinical - Medication Refill >> Apr 17, 2024  1:59 PM Edsel HERO wrote: Medication: valACYclovir  (VALTREX ) 500 MG tablet  Has the patient contacted their pharmacy? Yes  This is the patient's preferred pharmacy:  [facility] [phone] [address], [place] - [address] [place] [address] Phone: [phone] Fax: [phone]  Is this the correct pharmacy for this prescription? Yes If no, delete pharmacy and type the correct one.   Has the prescription been filled recently? Yes  Is the patient out of the medication? Yes  Has the patient been seen for an appointment in the last year OR does the patient have an upcoming appointment? Yes  Can we respond through MyChart? Yes

## 2024-04-19 ENCOUNTER — Telehealth: Payer: Self-pay | Admitting: Pharmacist

## 2024-04-19 DIAGNOSIS — R7303 Prediabetes: Secondary | ICD-10-CM

## 2024-04-19 NOTE — Progress Notes (Signed)
   04/19/2024  Patient ID: [name], female   DOB: [birth year], 83 y.o.   MRN: [id number]  Patient was called to follow up on blood sugars. Unfortunately, she did not answer the phone. HIPAA compliant message was left on her voicemail.  Plan:  Call Patient back in 3-4 weeks. Upcoming PCP appointment 05/09/2024    Cassius DOROTHA Brought, PharmD, BCACP Clinical Pharmacist [phone]

## 2024-04-20 ENCOUNTER — Other Ambulatory Visit: Payer: Self-pay

## 2024-04-20 NOTE — Patient Instructions (Signed)
 Visit Information  Thank you for taking time to visit with me today. Please don't hesitate to contact me if I can be of assistance to you before our next scheduled appointment.  Your next care management appointment is by telephone on Wednesday, September 17 at 2:00 PM  Please call the care guide team at [phone] if you need to cancel, schedule, or reschedule an appointment.   Please call 1-800-273-TALK (toll free, 24 hour hotline) if you are experiencing a Mental Health or Behavioral Health Crisis or need someone to talk to.  Clayborne Ly RN BSN CCM [place]  [facility], [facility] Health Nurse Care Coordinator  Direct Dial: [phone] Website: [email or web address]

## 2024-04-20 NOTE — Patient Outreach (Signed)
 Complex Care Management   Visit Note  04/20/2024  Name:  [name] MRN: [id number] DOB: 1940/09/14  Situation: Referral received for Complex Care Management related to COPD and Diabetes with Complications. I obtained verbal consent from Patient.  Visit completed with patient on the phone.   Background:   Past Medical History:  Diagnosis Date   Arthritis    Asthma    COPD (chronic obstructive pulmonary disease) (HCC)    Depression    Diabetes mellitus without complication (HCC)    Diverticulitis    GERD (gastroesophageal reflux disease)    Hyperlipidemia    Hypertension 06/15/2016   left lung ca dx'd 2016   Thyroid  nodule     Assessment: Patient Reported Symptoms:  Cognitive Cognitive Status: Alert and oriented to person, place, and time, Normal speech and language skills Cognitive/Intellectual Conditions Management [RPT]: None reported or documented in medical history or problem list   Health Maintenance Behaviors: Healthy diet, Exercise, Annual physical exam, Sleep adequate Health Facilitated by: Healthy diet, Rest  Neurological Neurological Review of Symptoms: No symptoms reported    HEENT HEENT Symptoms Reported: No symptoms reported      Cardiovascular Cardiovascular Symptoms Reported: No symptoms reported    Respiratory Respiratory Symptoms Reported: Shortness of breath Respiratory Management Strategies: Medication therapy, Routine screening, Breathing exercise  Endocrine Endocrine Symptoms Reported: No symptoms reported Is patient diabetic?: Yes Is patient checking blood sugars at home?: Yes List most recent blood sugar readings, include date and time of day: FBS today 106 Endocrine Self-Management Outcome: 4 (good)  Gastrointestinal Gastrointestinal Symptoms Reported: No symptoms reported      Genitourinary Genitourinary Symptoms Reported: No symptoms reported    Integumentary Integumentary Symptoms Reported: No symptoms reported    Musculoskeletal  Musculoskelatal Symptoms Reviewed: No symptoms reported        Psychosocial Psychosocial Symptoms Reported: No symptoms reported     Quality of Family Relationships: supportive Do you feel physically threatened by others?: No      02/10/2024   12:18 PM  Depression screen PHQ 2/9  Decreased Interest 0  Down, Depressed, Hopeless 0  PHQ - 2 Score 0    There were no vitals filed for this visit.  Medications Reviewed Today   Medications were not reviewed in this encounter     Recommendation:   PCP Follow-up with Gaines Ada FNP scheduled for: 05/09/24 at 10:00 AM Continue Current Plan of Care  Follow Up Plan:   Telephone follow up appointment date/time:  Wednesday, September 17 at 2:00 PM  Clayborne Ly RN BSN CCM [place]  [facility], [facility] Health Nurse Care Coordinator  Direct Dial: [phone] Website: [email or web address]

## 2024-04-22 ENCOUNTER — Other Ambulatory Visit: Payer: Self-pay | Admitting: Nurse Practitioner

## 2024-04-30 ENCOUNTER — Other Ambulatory Visit: Payer: Self-pay | Admitting: Pulmonary Disease

## 2024-04-30 DIAGNOSIS — J441 Chronic obstructive pulmonary disease with (acute) exacerbation: Secondary | ICD-10-CM

## 2024-05-09 ENCOUNTER — Encounter: Payer: Self-pay | Admitting: Nurse Practitioner

## 2024-05-09 ENCOUNTER — Ambulatory Visit (HOSPITAL_BASED_OUTPATIENT_CLINIC_OR_DEPARTMENT_OTHER): Payer: Self-pay | Admitting: Pulmonary Disease

## 2024-05-09 ENCOUNTER — Ambulatory Visit (INDEPENDENT_AMBULATORY_CARE_PROVIDER_SITE_OTHER): Admitting: Nurse Practitioner

## 2024-05-09 VITALS — BP 110/60 | HR 96 | Temp 98.4°F | Ht 63.0 in | Wt 160.0 lb

## 2024-05-09 DIAGNOSIS — E1122 Type 2 diabetes mellitus with diabetic chronic kidney disease: Secondary | ICD-10-CM

## 2024-05-09 DIAGNOSIS — I129 Hypertensive chronic kidney disease with stage 1 through stage 4 chronic kidney disease, or unspecified chronic kidney disease: Secondary | ICD-10-CM | POA: Diagnosis not present

## 2024-05-09 DIAGNOSIS — C3492 Malignant neoplasm of unspecified part of left bronchus or lung: Secondary | ICD-10-CM

## 2024-05-09 DIAGNOSIS — G4700 Insomnia, unspecified: Secondary | ICD-10-CM | POA: Diagnosis not present

## 2024-05-09 DIAGNOSIS — N183 Chronic kidney disease, stage 3 unspecified: Secondary | ICD-10-CM | POA: Diagnosis not present

## 2024-05-09 DIAGNOSIS — E782 Mixed hyperlipidemia: Secondary | ICD-10-CM | POA: Diagnosis not present

## 2024-05-09 DIAGNOSIS — Z79899 Other long term (current) drug therapy: Secondary | ICD-10-CM | POA: Diagnosis not present

## 2024-05-09 NOTE — Patient Instructions (Signed)
 Diabetes Action Plan A diabetes action plan is a way for you to manage your symptoms of diabetes, also called diabetes mellitus. The plan is color-coded to guide you on what actions to take based on any symptoms you're having. If you have symptoms in the red zone, you need medical care right away. If you have symptoms in the yellow zone, your diabetes isn't under control, and you may need to make some changes. If you have symptoms in the green zone, you're doing well. Understanding diabetes can take time. Follow the treatment plan that you created with your health care provider. Know the target range for your blood sugar, also called glucose. Review your plan each time you visit your provider. The target range for my blood sugar level is __________________________ mg/dL. Red zone Get medical help right away if you have any of the following symptoms: A blood sugar test result that's below 54 mg/dL (3 mmol/L). A blood sugar test result that's at or above 240 mg/dL (16.1 mmol/L) for 2 days in a row along with: Extreme thirst and frequent peeing. Confusion or trouble thinking clearly. Moderate or large ketone levels in your pee (urine). Feeling tired or having no energy. Trouble breathing. Sickness or a fever for 2 or more days that's not getting better. These symptoms may be an emergency. Call 911 right away. Do not wait to see if the symptoms will go away. Do not drive yourself to the hospital. If you have very low blood sugar, also called severe hypoglycemia, and you can't eat or drink, you may need glucagon. Make sure a family member or close friend knows how to check your blood sugar and how to give you glucagon. You may need to be treated in a hospital for this condition. Yellow zone If you have any of the following symptoms, your diabetes isn't under control, and you may need to make some changes: A blood sugar test result that's at or above 240 mg/dL (09.6 mmol/L) for 2 days in a  row. Blood sugar test results that are below 70 mg/dL (3.9 mmol/L). Other symptoms of hypoglycemia, such as: Shaking or feeling light-headed. Confusion or irritability. Feeling hungry. Having a fast heartbeat. If you have any yellow zone symptoms: Treat your hypoglycemia by eating or drinking 15 grams of a rapid-acting carbohydrate. Follow the 15:15 rule: Take 15 grams of a rapid-acting carbohydrate, such as: 1 tube of glucose gel. 4 glucose pills. 4 oz (120 mL) of fruit juice. 4 oz (120 mL) of regular (not diet) soda. Check your blood sugar again 15 minutes after you take the carbohydrate. If the second blood sugar test is still at or below 70 mg/dL (3.9 mmol/L), take 15 grams of a carbohydrate again. If your blood sugar doesn't increase above 70 mg/dL (3.9 mmol/L) after 3 tries, get medical help right away. After your blood sugar returns to normal, eat a meal or a snack within 1 hour. Keep taking your daily medicines as told by your provider. Check your blood sugar more often than you normally would. Write down your results. Call your provider if you have trouble keeping your blood sugar in your target range. Green zone These signs mean you're doing well and can continue what you're doing to manage your diabetes: Your blood sugar is within your personal target range. For most people, a blood sugar level before a meal should be 80-130 mg/dL (0.4-5.4 mmol/L). You feel well, and you're able to do daily activities. If you're in the green zone,  continue to manage your diabetes as told by your provider. To do this: Eat a healthy diet. Exercise regularly. Check your blood sugar as told. Take your medicines only as told. Where to find more information American Diabetes Association (ADA): diabetes.org Association of Diabetes Care & Education Specialists (ADCES): adces.org/diabetes-education-dsmes This information is not intended to replace advice given to you by your health care provider.  Make sure you discuss any questions you have with your health care provider. Document Revised: 04/13/2023 Document Reviewed: 04/13/2023 Elsevier Patient Education  2024 [employer].

## 2024-05-09 NOTE — Telephone Encounter (Signed)
 FYI pt has an appt with you tomorrow

## 2024-05-09 NOTE — Telephone Encounter (Signed)
 FYI Only or Action Required?: FYI only for provider.  Patient is followed in Pulmonology for COPD, last seen on 11/15/2023 by Jude Harden GAILS, MD.  Called Nurse Triage reporting Wheezing, Shortness of Breath, and Nasal Congestion.  Symptoms began several weeks ago.  Interventions attempted: OTC medications: Mucinex , Rescue inhaler, and Maintenance inhaler.  Symptoms are: stable.  Triage Disposition: See PCP When Office is Open (Within 3 Days)  Patient/caregiver understands and will follow disposition?: Yes                             Copied from CRM #[id number]. Topic: Clinical - Red Word Triage >> May 09, 2024 12:08 PM Nathanel DEL wrote: Red Word that prompted transfer to Nurse Triage: pt having wheezing and SOB a couple of weeks.  Not getting better Reason for Disposition  [1] Nasal discharge AND [2] present > 10 days  Answer Assessment - Initial Assessment Questions E2C2 Pulmonary Triage - Initial Assessment Questions  Chief Complaint (e.g., cough, sob, wheezing, fever, chills, sweat or additional symptoms) *Go to specific symptom protocol after initial questions. Congestion    Have you tested for COVID or Flu? Note: If not, ask patient if a home test can be taken. If so, instruct patient to call back for positive results. Denies  Have you used any OTC meds to help with symptoms? If yes, ask What medications? Mucinex    Have you used your inhalers/maintenance medication? If yes, What medications? Trelegy every morning, albuterol  1-2 x per day- states this is typical  Do you wear supplemental oxygen?  If yes, How many liters are you supposed to use? Denies   What is your usual oxygen saturation reading?  (Note: Pulmonary O2 sats should be 90% or greater) 98% today, normal is 93-95%  1. LOCATION: Where does it hurt?      Denies sinus pain 2. ONSET: When did the sinus pain start?  (e.g., hours, days)      A couple of weeks 4.  RECURRENT SYMPTOM: Have you ever had sinus problems before? If Yes, ask: When was the last time? and What happened that time?      Allergies year-round 6. NASAL DISCHARGE: Do you have discharge from your nose? If so ask, What color?     Yes, clear 7. FEVER: Do you have a fever? If Yes, ask: What is it, how was it measured, and when did it start?      Denies 8. OTHER SYMPTOMS: Do you have any other symptoms? (e.g., sore throat, cough, earache, difficulty breathing)     SOB upon exertion- states this has been normal for her lately, wheezing, cough- states she cannot get mucous up, patient able to speak in clear and complete sentences while on phone with this RN  Protocols used: Sinus Pain or Congestion-A-AH

## 2024-05-09 NOTE — Progress Notes (Signed)
 I,Victoria T Emmitt, CMA,acting as a Neurosurgeon for Gaines Ada, FNP.,have documented all relevant documentation on the behalf of Gaines Ada, FNP,as directed by  Gaines Ada, FNP while in the presence of Gaines Ada, FNP.  Subjective:  Patient ID: [name] , female    DOB: May 27, 1941 , 83 y.o.   MRN: [id number]  Chief Complaint  Patient presents with   Diabetes    Patient presents today for a diabetes and bp check. Patient reports compliance with her meds. Patent denies having chest pain,headaches or sob at this time. Patient doesn't have any questions or concerns.     HPI Discussed the use of AI scribe software for clinical note transcription with the patient, who gave verbal consent to proceed.  History of Present Illness [name] is an 83 year old female with diabetes who presents for a follow-up visit.  She manages her diabetes with medication, maintaining stable blood sugar levels with morning readings around 130 mg/dL and pre-meal readings around 109 mg/dL. She experienced one episode of a lower blood sugar reading at 89 mg/dL.  She has not consulted any other healthcare providers since her last visit and intends to confirm her next appointment with Dr. Cyndi office.  She continues to drive herself, being cautious of her speed to avoid traffic violations. She experiences occasional restless nights but generally has good sleep, waking primarily to urinate.  She avoids walking barefoot except for short distances at home and regularly inspects her feet for any open areas. She uses a pulse oximeter to monitor her oxygen levels and manages shortness of breath by resting as needed.   Diabetes She presents for her follow-up diabetic visit. She has type 2 diabetes mellitus. Her disease course has been stable. Pertinent negatives for hypoglycemia include no dizziness or headaches. Pertinent negatives for diabetes include no fatigue, no polydipsia, no polyphagia and no  polyuria. There are no hypoglycemic complications. There are no diabetic complications. Risk factors for coronary artery disease include sedentary lifestyle and diabetes mellitus. Current diabetic treatment includes diet. She is following a generally healthy diet. When asked about meal planning, she reported none. She has not had a previous visit with a dietitian. She rarely participates in exercise. (Blood sugar 130's, she eats around 12pm will be 89-109) She does not see a podiatrist.Eye exam current: scheduled to see eye doctor in June.  Hyperlipidemia This is a chronic problem. The current episode started more than 1 year ago. The problem is controlled. Recent lipid tests were reviewed and are variable. There are no known factors aggravating her hyperlipidemia. Current antihyperlipidemic treatment includes ezetimibe . There are no compliance problems.  Risk factors for coronary artery disease include diabetes mellitus, dyslipidemia and a sedentary lifestyle.     Past Medical History:  Diagnosis Date   Arthritis    Asthma    COPD (chronic obstructive pulmonary disease) (HCC)    Depression    Diabetes mellitus without complication (HCC)    Diverticulitis    GERD (gastroesophageal reflux disease)    Hyperlipidemia    Hypertension 06/15/2016   left lung ca dx'd 2016   Thyroid  nodule      Family History  Problem Relation Age of Onset   Renal cancer Brother    Diabetes Mother    Hypertension Mother    Renal Disease Mother    Healthy Father      Current Outpatient Medications:    albuterol  (PROVENTIL ) (2.5 MG/3ML) 0.083% nebulizer solution, Take 3 mLs (2.5 mg total)  by nebulization every 6 (six) hours as needed for wheezing or shortness of breath., Disp: 75 mL, Rfl: 12   albuterol  (VENTOLIN  HFA) 108 (90 Base) MCG/ACT inhaler, Inhale 2 puffs into the lungs every 6 (six) hours as needed for wheezing or shortness of breath., Disp: 8.5 each, Rfl: 3   aspirin  81 MG tablet, Take 81 mg by mouth  every evening. , Disp: , Rfl:    Blood Glucose Monitoring Suppl (TRUE METRIX AIR GLUCOSE METER) w/Device KIT, Use to check blood sugar 4 times a day. Dx code e11.65, Disp: 1 kit, Rfl: 2   Calcium  Carbonate-Vitamin D  600-400 MG-UNIT tablet, Take 1 tablet by mouth daily., Disp:  , Rfl:    cetirizine (ZYRTEC) 10 MG tablet, Take 10 mg by mouth daily., Disp: , Rfl:    cycloSPORINE  (RESTASIS ) 0.05 % ophthalmic emulsion, 1 drop 2 (two) times daily., Disp: , Rfl:    donepezil  (ARICEPT ) 10 MG tablet, TAKE 1 TABLET BY MOUTH EVERY DAY IN THE EVENING, Disp: 90 tablet, Rfl: 1   ezetimibe  (ZETIA ) 10 MG tablet, TAKE 1 TABLET BY MOUTH EVERY DAY, Disp: 90 tablet, Rfl: 2   fluticasone  (FLONASE) 50 MCG/ACT nasal spray, Place into both nostrils daily., Disp: , Rfl:    Fluticasone -Umeclidin-Vilant (TRELEGY ELLIPTA ) 100-62.5-25 MCG/ACT AEPB, INHALE 1 PUFF ONE TIME DAILY, Disp: 180 each, Rfl: 0   glucose blood (TRUE METRIX BLOOD GLUCOSE TEST) test strip, USE AS INSTRUCTED TO CHECK BLOOD SUGAR TWICE DAILY, Disp: 200 strip, Rfl: 3   JARDIANCE  10 MG TABS tablet, TAKE 1 TABLET BY MOUTH EVERY DAY, Disp: 30 tablet, Rfl: 2   lubiprostone  (AMITIZA ) 8 MCG capsule, TAKE 1 CAPSULE (8 MCG TOTAL) BY MOUTH DAILY WITH BREAKFAST., Disp: 90 capsule, Rfl: 1   magnesium oxide (MAG-OX) 400 MG tablet, Take 400 mg by mouth at bedtime., Disp: , Rfl:    Melatonin 2.5 MG CAPS, Take 2.5 mg by mouth at bedtime as needed (sleep). , Disp: , Rfl:    mirtazapine  (REMERON ) 15 MG tablet, TAKE 1 TABLET BY MOUTH EVERYDAY AT BEDTIME, Disp: 90 tablet, Rfl: 1   Multiple Vitamin (MULTIVITAMIN) tablet, Take 1 tablet by mouth daily., Disp: , Rfl:    niacin  (VITAMIN B3) 500 MG ER tablet, TAKE 1 TABLET BY MOUTH EVERY DAY IN THE EVENING, Disp: 90 tablet, Rfl: 3   OVER THE COUNTER MEDICATION, Take 1 tablet by mouth daily., Disp: , Rfl:    pantoprazole  (PROTONIX ) 40 MG tablet, Take 1 tablet (40 mg total) by mouth 2 (two) times daily., Disp: 180 tablet, Rfl: 1    potassium chloride  SA (KLOR-CON  M) 20 MEQ tablet, TAKE 1 TABLET BY MOUTH EVERY DAY WITH FOOD, Disp: 90 tablet, Rfl: 1   Probiotic Product (PROBIOTIC PO), Take by mouth daily at 6 (six) AM., Disp: , Rfl:    rosuvastatin  (CRESTOR ) 10 MG tablet, TAKE 1 TABLET BY MOUTH EVERY DAY, Disp: 90 tablet, Rfl: 1   TRUEplus Lancets 33G MISC, TEST BLOOD SUGAR AS DIRECTED, Disp: 200 each, Rfl: 3   valACYclovir  (VALTREX ) 500 MG tablet, Take 1 tablet (500 mg total) by mouth daily., Disp: 90 tablet, Rfl: 0   White Petrolatum -Mineral Oil (SYSTANE NIGHTTIME OP), Place 1 drop into both eyes at bedtime as needed (for dry eyes). , Disp: , Rfl:    predniSONE  (DELTASONE ) 10 MG tablet, Take 4 tabs  daily with food x 4 days, then 3 tabs daily x 4 days, then 2 tabs daily x 4 days, then 1 tab daily x4  days then stop. #40, Disp: 40 tablet, Rfl: 0   No Known Allergies   Review of Systems  Constitutional: Negative.  Negative for fatigue.  Respiratory: Negative.    Cardiovascular: Negative.   Endocrine: Negative for polydipsia, polyphagia and polyuria.  Neurological: Negative.  Negative for dizziness and headaches.  Psychiatric/Behavioral: Negative.       Today's Vitals   05/09/24 0959  BP: 110/60  Pulse: 96  Temp: 98.4 F (36.9 C)  TempSrc: Oral  Weight: 160 lb (72.6 kg)  Height: 5' 3 (1.6 m)  PainSc: 0-No pain   Body mass index is 28.34 kg/m.  Wt Readings from Last 3 Encounters:  05/10/24 158 lb (71.7 kg)  05/09/24 160 lb (72.6 kg)  01/05/24 158 lb 6.4 oz (71.8 kg)     Objective:  Physical Exam Vitals and nursing note reviewed.  Constitutional:      General: She is not in acute distress.    Appearance: Normal appearance.  Cardiovascular:     Rate and Rhythm: Normal rate and regular rhythm.     Pulses: Normal pulses.     Heart sounds: Normal heart sounds. No murmur heard. Pulmonary:     Effort: Pulmonary effort is normal. No respiratory distress.     Breath sounds: No wheezing.     Comments:  Continues to have some shortness of breath however recovers well.  Skin:    General: Skin is warm and dry.  Neurological:     General: No focal deficit present.     Mental Status: She is alert and oriented to person, place, and time.     Cranial Nerves: No cranial nerve deficit.     Motor: No weakness.  Psychiatric:        Mood and Affect: Mood normal.        Behavior: Behavior normal.        Thought Content: Thought content normal.        Judgment: Judgment normal.      Diabetic foot exam was performed with the following findings:   No deformities, ulcerations, or other skin breakdown Normal sensation of 10g monofilament Intact posterior tibialis and dorsalis pedis pulses      Assessment And Plan:  Diabetes mellitus with stage 3 chronic kidney disease (HCC) Assessment & Plan: Blood glucose generally well-controlled. One hypoglycemic event likely due to missed meal. No new symptoms. Feet intact with normal sensation. Oxygen levels stable. - Continue current diabetes management and medication regimen. - Monitor blood glucose levels regularly. - diabetic foot exam done  Orders: -     Hemoglobin A1c  Hypertensive nephropathy Assessment & Plan: Blood pressure is well controlled. Continue current medications.   Orders: -     CMP14+EGFR  Mixed hyperlipidemia Assessment & Plan: Cholesterol levels are stable. Continue current medications  Orders: -     CMP14+EGFR -     Lipid panel  Insomnia, unspecified type Assessment & Plan: Overall sleeping well.    Other long term (current) drug therapy -     CBC  Adenocarcinoma of left lung [facility]) Assessment & Plan: Continue f/u with Pulmonology      Return for controlled DM check-4 months; schedule influenza vaccine .  Patient was given opportunity to ask questions. Patient verbalized understanding of the plan and was able to repeat key elements of the plan. All questions were answered to their satisfaction.  Gaines Ada, FNP  I, Gaines Ada, FNP, have reviewed all documentation for this visit. The documentation on 05/09/24  for the exam, diagnosis, procedures, and orders are all accurate and complete.   IF YOU HAVE BEEN REFERRED TO A SPECIALIST, IT MAY TAKE 1-2 WEEKS TO SCHEDULE/PROCESS THE REFERRAL. IF YOU HAVE NOT HEARD FROM US /SPECIALIST IN TWO WEEKS, PLEASE GIVE US  A CALL AT [phone].   THE PATIENT IS ENCOURAGED TO PRACTICE SOCIAL DISTANCING DUE TO THE COVID-19 PANDEMIC.

## 2024-05-10 ENCOUNTER — Ambulatory Visit (HOSPITAL_BASED_OUTPATIENT_CLINIC_OR_DEPARTMENT_OTHER): Admitting: Pulmonary Disease

## 2024-05-10 ENCOUNTER — Encounter (HOSPITAL_BASED_OUTPATIENT_CLINIC_OR_DEPARTMENT_OTHER): Payer: Self-pay | Admitting: Pulmonary Disease

## 2024-05-10 VITALS — BP 134/66 | HR 90 | Ht 63.0 in | Wt 158.0 lb

## 2024-05-10 DIAGNOSIS — Z7984 Long term (current) use of oral hypoglycemic drugs: Secondary | ICD-10-CM | POA: Diagnosis not present

## 2024-05-10 DIAGNOSIS — J449 Chronic obstructive pulmonary disease, unspecified: Secondary | ICD-10-CM

## 2024-05-10 DIAGNOSIS — J441 Chronic obstructive pulmonary disease with (acute) exacerbation: Secondary | ICD-10-CM

## 2024-05-10 DIAGNOSIS — E119 Type 2 diabetes mellitus without complications: Secondary | ICD-10-CM

## 2024-05-10 DIAGNOSIS — Z87891 Personal history of nicotine dependence: Secondary | ICD-10-CM | POA: Diagnosis not present

## 2024-05-10 LAB — LIPID PANEL
Chol/HDL Ratio: 2.3 ratio (ref 0.0–4.4)
Cholesterol, Total: 127 mg/dL (ref 100–199)
HDL: 55 mg/dL
LDL Chol Calc (NIH): 53 mg/dL (ref 0–99)
Triglycerides: 105 mg/dL (ref 0–149)
VLDL Cholesterol Cal: 19 mg/dL (ref 5–40)

## 2024-05-10 LAB — CMP14+EGFR
ALT: 36 IU/L — ABNORMAL HIGH (ref 0–32)
AST: 43 IU/L — ABNORMAL HIGH (ref 0–40)
Albumin: 4.4 g/dL (ref 3.7–4.7)
Alkaline Phosphatase: 83 IU/L (ref 44–121)
BUN/Creatinine Ratio: 13 (ref 12–28)
BUN: 14 mg/dL (ref 8–27)
Bilirubin Total: 1 mg/dL (ref 0.0–1.2)
CO2: 25 mmol/L (ref 20–29)
Calcium: 10.1 mg/dL (ref 8.7–10.3)
Chloride: 101 mmol/L (ref 96–106)
Creatinine, Ser: 1.05 mg/dL — ABNORMAL HIGH (ref 0.57–1.00)
Globulin, Total: 2.6 g/dL (ref 1.5–4.5)
Glucose: 129 mg/dL — ABNORMAL HIGH (ref 70–99)
Potassium: 4.2 mmol/L (ref 3.5–5.2)
Sodium: 143 mmol/L (ref 134–144)
Total Protein: 7 g/dL (ref 6.0–8.5)
eGFR: 53 mL/min/1.73 — ABNORMAL LOW

## 2024-05-10 LAB — HEMOGLOBIN A1C
Est. average glucose Bld gHb Est-mCnc: 166 mg/dL
Hgb A1c MFr Bld: 7.4 % — ABNORMAL HIGH (ref 4.8–5.6)

## 2024-05-10 LAB — CBC
Hematocrit: 48.4 % — ABNORMAL HIGH (ref 34.0–46.6)
Hemoglobin: 15.9 g/dL (ref 11.1–15.9)
MCH: 30.8 pg (ref 26.6–33.0)
MCHC: 32.9 g/dL (ref 31.5–35.7)
MCV: 94 fL (ref 79–97)
Platelets: 192 x10E3/uL (ref 150–450)
RBC: 5.17 x10E6/uL (ref 3.77–5.28)
RDW: 13.9 % (ref 11.7–15.4)
WBC: 6.4 x10E3/uL (ref 3.4–10.8)

## 2024-05-10 MED ORDER — PREDNISONE 10 MG PO TABS: ORAL_TABLET | ORAL | 0 refills | Status: DC

## 2024-05-10 NOTE — Progress Notes (Signed)
 Subjective:    Patient ID: [name], female    DOB: [birth year], 83 y.o.   MRN: [id number]   83 yo ex-smoker for follow-up of COPD and lung cancer She smoked about 60 pack years. She worked in a Special educational needs teacher for 20 years   06/2015 underwent left VATS with left upper lobectomy and mediastinal lymph node dissection. Path  showed invasive adenocarcinoma,  adenocarcinoma involving the visceral pleura in addition to lymphovascular invasion. The resection margins as well as the dissected lymph nodes were negative for malignancy. She had persistent air leak postop and required endobronchial valves for resolution   Discussed the use of AI scribe software for clinical note transcription with the patient, who gave verbal consent to proceed.  History of Present Illness  Called Nurse Triage reporting Wheezing, Shortness of Breath, and Nasal Congestion.   Symptoms began 3weeks ago.   Interventions attempted: OTC medications: Mucinex , Rescue inhaler, and Maintenance inhaler.  Discussed the use of AI scribe software for clinical note transcription with the patient, who gave verbal consent to proceed.  History of Present Illness   [name] is an 83 year old female with COPD who presents with increased wheezing and difficulty expectorating mucus.  For the past three weeks, she experiences increased wheezing and difficulty expectorating mucus, describing it as 'it breaks up, but I can't get it up.' There is no recent cold or infection, and she is unsure if symptoms are allergy-related. She uses her nebulizer almost nightly last week but only twice this week. She is compliant with her Trelegy inhaler every morning and takes Mucinex  twice daily. She experiences perennial allergies with nasal drainage and takes Zyrtec.         Significant tests/ events reviewed   05/2022 CT chest >> nodular scarring left lower lobe   05/2021 CT chest >> stable areas of scarring left upper lobe  postsurgical,leftlower lobe nodular scarring 12/2017 CT chest >> stable, nodular area of left lung scarring 2016 Pre-op PFT - FEV1 73 %, DLCO 48%   03/2016 FEV1 66%   Spirometry 10/2017 shows an FEV1 at 63% ratio 57 and FVC of 86%. PFTs 01/2023 moderate airway obstruction, ratio 60, FEV1 55%, TLC 153%/hyperinflation, DLCO 8.1/44%  Review of Systems  neg for any significant sore throat, dysphagia, itching, sneezing, nasal congestion or excess/ purulent secretions, fever, chills, sweats, unintended wt loss, pleuritic or exertional cp, hempoptysis, orthopnea pnd or change in chronic leg swelling. Also denies presyncope, palpitations, heartburn, abdominal pain, nausea, vomiting, diarrhea or change in bowel or urinary habits, dysuria,hematuria, rash, arthralgias, visual complaints, headache, numbness weakness or ataxia.      Objective:   Physical Exam  Gen. Pleasant, well-nourished, in no distress ENT - no thrush, no pallor/icterus,no post nasal drip Neck: No JVD, no thyromegaly, no carotid bruits Lungs: no use of accessory muscles, no dullness to percussion, BL diffuse rhonchi  Cardiovascular: Rhythm regular, heart sounds  normal, no murmurs or gallops, no peripheral edema Musculoskeletal: No deformities, no cyanosis or clubbing        Assessment & Plan:   Assessment and Plan Assessment & Plan  Assessment and Plan    Chronic obstructive pulmonary disease (COPD) COPD exacerbation with wheezing and difficulty expectorating sputum. Symptoms may be related to perennial allergies causing postnasal drip. No peripheral edema. Symptoms persist. - Prescribe prednisone  taper over two weeks to manage COPD exacerbation. - Continue Trelegy daily for COPD management. - Advise use of nebulizer once daily until symptoms improve. -  Continue Zyrtec for nasal drainage and perennial allergies.  Type 2 diabetes mellitus without complications Type 2 diabetes mellitus is well-controlled with oral  medication. No insulin use. Blood sugar levels are stable. - Continue current diabetes medication regimen, including Jardiance . -sugars may increase while on prednisone 

## 2024-05-10 NOTE — Patient Instructions (Signed)
 Prednisone  10 mg tabs Take 4 tabs  daily with food x 4 days, then 3 tabs daily x 4 days, then 2 tabs daily x 4 days, then 1 tab daily x4 days then stop. #40  Take albuterol  neb 1-2 times daily until improved   Call if no better

## 2024-05-18 ENCOUNTER — Telehealth: Payer: Self-pay | Admitting: Pharmacist

## 2024-05-18 DIAGNOSIS — N183 Chronic kidney disease, stage 3 unspecified: Secondary | ICD-10-CM

## 2024-05-18 NOTE — Progress Notes (Signed)
 05/18/2024  Patient ID: [name], female   DOB: Apr 19, 1941, 83 y.o.   MRN: [id number]  Patient was called to follow up on blood sugars. HIPAA identifiers were obtained.   Patient said she was feeling ok but that she visited her pulmonologist and was started on prednisone  for COPD exacerbation. She has noticed an increase in her blood sugar. We had a discussion on how this is normal and for her to be a little more mindful about food choices while on the Prednisone . She said her last day would be 05/26/2024.  Her new HgA1c from 05/09/24 was 7.4% goal <7.5%- On Jardiance  10 mg  On rosuvastatin  10mg  03/30/24 #90 and Ezetimibe .--SUPD gap closed  Lipid Panel     Component Value Date/Time   CHOL 127 05/09/2024 1118   TRIG 105 05/09/2024 1118   HDL 55 05/09/2024 1118   CHOLHDL 2.3 05/09/2024 1118   LDLCALC 53 05/09/2024 1118   LABVLDL 19 05/09/2024 1118        05/10/2024    2:35 PM 05/09/2024    9:59 AM 01/09/2024   12:30 PM  Vitals with BMI  Height 5' 3 5' 3   Weight 158 lbs 160 lbs   BMI 28 28.35   Systolic 134 110 872  Diastolic 66 60 76  Pulse 90 96    SUPD gap closed. B/P at goal   Medications Reviewed Today     Reviewed by Jolee Cassius PARAS, [facility] (Pharmacist) on 05/18/24 at 1207  Med List Status: <None>   Medication Order Taking? Sig Documenting Provider Last Dose Status Informant  albuterol  (PROVENTIL ) (2.5 MG/3ML) 0.083% nebulizer solution [id number] Yes Take 3 mLs (2.5 mg total) by nebulization every 6 (six) hours as needed for wheezing or shortness of breath. Parrett, Madelin RAMAN, NP  Active   albuterol  (VENTOLIN  HFA) 108 (90 Base) MCG/ACT inhaler [id number] Yes Inhale 2 puffs into the lungs every 6 (six) hours as needed for wheezing or shortness of breath. Jude Harden GAILS, MD  Active   aspirin  81 MG tablet [id number] Yes Take 81 mg by mouth every evening.  [provider]  Active Self  Blood Glucose Monitoring Suppl (TRUE METRIX AIR GLUCOSE METER) w/Device KIT  [id number]  Use to check blood sugar 4 times a day. Dx code e11.65 Georgina Speaks, FNP  Active   Calcium  Carbonate-Vitamin D  600-400 MG-UNIT tablet [id number] Yes Take 1 tablet by mouth daily. Jarold Medici, MD  Active   cetirizine (ZYRTEC) 10 MG tablet [id number] Yes Take 10 mg by mouth daily. [provider]  Active   cycloSPORINE  (RESTASIS ) 0.05 % ophthalmic emulsion [id number] Yes 1 drop 2 (two) times daily. [provider]  Active   donepezil  (ARICEPT ) 10 MG tablet [id number] Yes TAKE 1 TABLET BY MOUTH EVERY DAY IN THE KARNA Georgina, Speaks, FNP  Active   ezetimibe  (ZETIA ) 10 MG tablet [id number] Yes TAKE 1 TABLET BY MOUTH EVERY DAY Georgina Speaks, FNP  Active   fluticasone  [facility]) 50 MCG/ACT nasal spray [id number] Yes Place into both nostrils daily. [provider]  Active   Fluticasone -Umeclidin-Vilant (TRELEGY ELLIPTA ) 100-62.5-25 MCG/ACT AEPB [id number] Yes INHALE 1 PUFF ONE TIME DAILY Jude Harden GAILS, MD  Active   glucose blood (TRUE METRIX BLOOD GLUCOSE TEST) test strip [id number] Yes USE AS INSTRUCTED TO CHECK BLOOD SUGAR TWICE DAILY Moore, Janece, FNP  Active   JARDIANCE  10 MG TABS tablet [id number] Yes TAKE 1 TABLET BY MOUTH EVERY DAY Georgina Speaks, FNP  Active   lubiprostone  (  AMITIZA ) 8 MCG capsule [id number] Yes TAKE 1 CAPSULE (8 MCG TOTAL) BY MOUTH DAILY WITH BREAKFAST. Georgina Speaks, FNP  Active   magnesium oxide (MAG-OX) 400 MG tablet [id number] Yes Take 400 mg by mouth at bedtime. [provider]  Active   Melatonin 2.5 MG CAPS [id number] Yes Take 2.5 mg by mouth at bedtime as needed (sleep).  [provider]  Active Self  mirtazapine  (REMERON ) 15 MG tablet [id number] Yes TAKE 1 TABLET BY MOUTH EVERYDAY AT BEDTIME Georgina Speaks, FNP  Active   Multiple Vitamin (MULTIVITAMIN) tablet [id number] Yes Take 1 tablet by mouth daily. [provider]  Active Self  niacin  (VITAMIN B3) 500 MG ER tablet [id number] Yes TAKE 1 TABLET BY MOUTH EVERY DAY IN THE  KARNA Georgina Speaks, FNP  Active   OVER THE COUNTER MEDICATION [id number] Yes Take 1 tablet by mouth daily. [provider]  Active Self           Med Note SYDELL, LOA DEL   Thu Jun 23, 2017 11:19 AM) Mega red 4 in 1  pantoprazole  (PROTONIX ) 40 MG tablet [id number] Yes Take 1 tablet (40 mg total) by mouth 2 (two) times daily. Georgina Speaks, FNP  Active   potassium chloride  SA (KLOR-CON  M) 20 MEQ tablet [id number] Yes TAKE 1 TABLET BY MOUTH EVERY DAY WITH FOOD Georgina Speaks, FNP  Active   predniSONE  (DELTASONE ) 10 MG tablet [id number] Yes Take 4 tabs  daily with food x 4 days, then 3 tabs daily x 4 days, then 2 tabs daily x 4 days, then 1 tab daily x4 days then stop. #40 Jude Harden GAILS, MD  Active   Probiotic Product (PROBIOTIC PO) [id number] Yes Take by mouth daily at 6 (six) AM. [provider]  Active   rosuvastatin  (CRESTOR ) 10 MG tablet [id number] Yes TAKE 1 TABLET BY MOUTH EVERY DAY Georgina Speaks, FNP  Active   TRUEplus Lancets 33G MISC [id number] Yes TEST BLOOD SUGAR AS DIRECTED Georgina Speaks, FNP  Active   valACYclovir  (VALTREX ) 500 MG tablet [id number] Yes Take 1 tablet (500 mg total) by mouth daily. Georgina Speaks, FNP  Active   White Petrolatum -Mineral Oil (SYSTANE NIGHTTIME OP) [id number] Yes Place 1 drop into both eyes at bedtime as needed (for dry eyes).  [provider]  Active Self           Med Note ALONA, FREDDIE CHRISTELLA Kitchens Sep 25, 2018  4:03 PM)    Med List Note Murray Maywood, CMA 02/22/19 1117): caltrate              Will follow up in 2 months.   Cassius DOROTHA Brought, PharmD, BCACP Clinical Pharmacist [phone]

## 2024-05-20 ENCOUNTER — Ambulatory Visit: Payer: Self-pay | Admitting: Nurse Practitioner

## 2024-05-20 NOTE — Assessment & Plan Note (Signed)
 Overall sleeping well.

## 2024-05-20 NOTE — Assessment & Plan Note (Addendum)
 Blood glucose generally well-controlled. One hypoglycemic event likely due to missed meal. No new symptoms. Feet intact with normal sensation. Oxygen levels stable. - Continue current diabetes management and medication regimen. - Monitor blood glucose levels regularly. - diabetic foot exam done

## 2024-05-20 NOTE — Assessment & Plan Note (Signed)
Continue f/u with Pulmonology.  

## 2024-05-20 NOTE — Assessment & Plan Note (Signed)
 Cholesterol levels are stable. Continue current medications

## 2024-05-20 NOTE — Assessment & Plan Note (Signed)
 Blood pressure is well controlled. Continue current medications.

## 2024-05-22 ENCOUNTER — Ambulatory Visit

## 2024-05-23 ENCOUNTER — Other Ambulatory Visit: Payer: Self-pay

## 2024-05-23 NOTE — Patient Instructions (Signed)
 Visit Information  Thank you for taking time to visit with me today. Please don't hesitate to contact me if I can be of assistance to you before our next scheduled appointment.  Your next care management appointment is by telephone on Wednesday, October 1 at 12:45 PM  Please call the care guide team at [phone] if you need to cancel, schedule, or reschedule an appointment.   Please call [phone]-TALK (toll free, 24 hour hotline) if you are experiencing a Mental Health or Behavioral Health Crisis or need someone to talk to.  Clayborne Ly RN BSN CCM [place]  [facility], [facility] Nurse Care Coordinator  Direct Dial: [phone] Website: [email or web address]

## 2024-05-23 NOTE — Patient Outreach (Signed)
 Complex Care Management   Visit Note  05/23/2024  Name:  [name] MRN: [id number] DOB: Oct 25, 1940  Situation: Referral received for Complex Care Management related to COPD and Diabetes with Complications, hot flashes, night sweats. I obtained verbal consent from Patient.  Visit completed with Patient  on the phone.  Background:   Past Medical History:  Diagnosis Date   Arthritis    Asthma    COPD (chronic obstructive pulmonary disease) (HCC)    Depression    Diabetes mellitus without complication (HCC)    Diverticulitis    GERD (gastroesophageal reflux disease)    Hyperlipidemia    Hypertension 06/15/2016   left lung ca dx'd 2016   Thyroid  nodule     Assessment: Patient Reported Symptoms:  Cognitive Cognitive Status: Alert and oriented to person, place, and time, Normal speech and language skills Cognitive/Intellectual Conditions Management [RPT]: None reported or documented in medical history or problem list   Health Maintenance Behaviors: Annual physical exam, Healthy diet, Immunizations Health Facilitated by: Rest, Healthy diet  Neurological Neurological Review of Symptoms: No symptoms reported    HEENT HEENT Symptoms Reported: Nasal discharge HEENT Management Strategies: Routine screening, Medication therapy HEENT Self-Management Outcome: 4 (good)    Cardiovascular Cardiovascular Symptoms Reported: No symptoms reported    Respiratory Respiratory Symptoms Reported: Shortness of breath Respiratory Management Strategies: Adequate rest, Routine screening, Medication therapy Respiratory Self-Management Outcome: 4 (good)  Endocrine Endocrine Symptoms Reported: No symptoms reported Is patient diabetic?: Yes Is patient checking blood sugars at home?: Yes List most recent blood sugar readings, include date and time of day: FBS today 120 Endocrine Self-Management Outcome: 4 (good)  Gastrointestinal Gastrointestinal Symptoms Reported: No symptoms reported       Genitourinary Genitourinary Symptoms Reported: No symptoms reported    Integumentary Integumentary Symptoms Reported: Sweating Additional Integumentary Details: patient reports having night sweats and hot flashes, PCP made aware Skin Management Strategies: Routine screening Skin Self-Management Outcome: 3 (uncertain)  Musculoskeletal Musculoskelatal Symptoms Reviewed: No symptoms reported        Psychosocial Psychosocial Symptoms Reported: No symptoms reported   Major Change/Loss/Stressor/Fears (CP): Denies Quality of Family Relationships: supportive, involved, helpful Do you feel physically threatened by others?: No    05/23/2024    PHQ2-9 Depression Screening   Francenia Chimenti interest or pleasure in doing things    Feeling down, depressed, or hopeless    PHQ-2 - Total Score    Trouble falling or staying asleep, or sleeping too much    Feeling tired or having Elfreida Heggs energy    Poor appetite or overeating     Feeling bad about yourself - or that you are a failure or have let yourself or your family down    Trouble concentrating on things, such as reading the newspaper or watching television    Moving or speaking so slowly that other people could have noticed.  Or the opposite - being so fidgety or restless that you have been moving around a lot more than usual    Thoughts that you would be better off dead, or hurting yourself in some way    PHQ2-9 Total Score    If you checked off any problems, how difficult have these problems made it for you to do your work, take care of things at home, or get along with other people    Depression Interventions/Treatment      There were no vitals filed for this visit.  Medications Reviewed Today     Reviewed by Morgan Shields  L, RN (Registered Nurse) on 05/23/24 at 1418  Med List Status: <None>   Medication Order Taking? Sig Documenting Provider Last Dose Status Informant  albuterol  (PROVENTIL ) (2.5 MG/3ML) 0.083% nebulizer solution [id number]  Take  3 mLs (2.5 mg total) by nebulization every 6 (six) hours as needed for wheezing or shortness of breath. Parrett, Madelin RAMAN, NP  Active   albuterol  (VENTOLIN  HFA) 108 (90 Base) MCG/ACT inhaler [id number]  Inhale 2 puffs into the lungs every 6 (six) hours as needed for wheezing or shortness of breath. Jude Harden GAILS, MD  Active   aspirin  81 MG tablet [id number]  Take 81 mg by mouth every evening.  [provider]  Active Self  Blood Glucose Monitoring Suppl (TRUE METRIX AIR GLUCOSE METER) w/Device KIT [id number]  Use to check blood sugar 4 times a day. Dx code e11.65 Georgina Speaks, FNP  Active   Calcium  Carbonate-Vitamin D  600-400 MG-UNIT tablet [id number]  Take 1 tablet by mouth daily. Jarold Medici, MD  Active   cetirizine (ZYRTEC) 10 MG tablet [id number]  Take 10 mg by mouth daily. [provider]  Active   cycloSPORINE  (RESTASIS ) 0.05 % ophthalmic emulsion [id number]  1 drop 2 (two) times daily. [provider]  Active   donepezil  (ARICEPT ) 10 MG tablet [id number]  TAKE 1 TABLET BY MOUTH EVERY DAY IN THE KARNA Georgina, Speaks, FNP  Active   ezetimibe  (ZETIA ) 10 MG tablet [id number]  TAKE 1 TABLET BY MOUTH EVERY DAY Georgina Speaks, FNP  Active   fluticasone  [facility]) 50 MCG/ACT nasal spray [id number]  Place into both nostrils daily. [provider]  Active   Fluticasone -Umeclidin-Vilant (TRELEGY ELLIPTA ) 100-62.5-25 MCG/ACT AEPB [id number]  INHALE 1 PUFF ONE TIME DAILY Jude Harden GAILS, MD  Active   glucose blood (TRUE METRIX BLOOD GLUCOSE TEST) test strip [id number]  USE AS INSTRUCTED TO CHECK BLOOD SUGAR TWICE DAILY Georgina Speaks, FNP  Active   JARDIANCE  10 MG TABS tablet [id number]  TAKE 1 TABLET BY MOUTH EVERY DAY Moore, Janece, FNP  Active   lubiprostone  (AMITIZA ) 8 MCG capsule [id number]  TAKE 1 CAPSULE (8 MCG TOTAL) BY MOUTH DAILY WITH BREAKFAST. Georgina Speaks, FNP  Active   magnesium oxide (MAG-OX) 400 MG tablet [id number]  Take 400 mg by mouth at bedtime. [provider]  Active   Melatonin 2.5 MG CAPS [id number]  Take 2.5 mg by mouth at bedtime as needed (sleep).  [provider]  Active Self  mirtazapine  (REMERON ) 15 MG tablet [id number]  TAKE 1 TABLET BY MOUTH EVERYDAY AT BEDTIME Georgina Speaks, FNP  Active   Multiple Vitamin (MULTIVITAMIN) tablet [id number]  Take 1 tablet by mouth daily. [provider]  Active Self  niacin  (VITAMIN B3) 500 MG ER tablet [id number]  TAKE 1 TABLET BY MOUTH EVERY DAY IN THE KARNA Georgina Speaks, FNP  Active   OVER THE COUNTER MEDICATION [id number]  Take 1 tablet by mouth daily. [provider]  Active Self           Med Note SYDELL, DIANE H   Thu Jun 23, 2017 11:19 AM) Mega red 4 in 1  pantoprazole  (PROTONIX ) 40 MG tablet [id number]  Take 1 tablet (40 mg total) by mouth 2 (two) times daily. Georgina Speaks, FNP  Active   potassium chloride  SA (KLOR-CON  M) 20 MEQ tablet [id number]  TAKE 1 TABLET BY MOUTH EVERY DAY WITH FOOD Georgina Speaks, FNP  Active   predniSONE  (DELTASONE ) 10 MG tablet [id number]  Take 4 tabs  daily  with food x 4 days, then 3 tabs daily x 4 days, then 2 tabs daily x 4 days, then 1 tab daily x4 days then stop. #40 Jude Harden GAILS, MD  Active   Probiotic Product (PROBIOTIC PO) [id number]  Take by mouth daily at 6 (six) AM. [provider]  Active   rosuvastatin  (CRESTOR ) 10 MG tablet [id number]  TAKE 1 TABLET BY MOUTH EVERY DAY Georgina Speaks, FNP  Active   TRUEplus Lancets 33G MISC [id number]  TEST BLOOD SUGAR AS DIRECTED Georgina Speaks, FNP  Active   valACYclovir  (VALTREX ) 500 MG tablet [id number]  Take 1 tablet (500 mg total) by mouth daily. Georgina Speaks, FNP  Active   White Petrolatum -Mineral Oil (SYSTANE NIGHTTIME OP) [id number]  Place 1 drop into both eyes at bedtime as needed (for dry eyes).  [provider]  Active Self           Med Note ALONA FREDDIE CHRISTELLA Pablo Sep 25, 2018  4:03 PM)    Med List Note Murray [place], [place] 02/22/19 1117): caltrate               Recommendation:   PCP Follow-up for Flu Vaccine on 06/15/24 at 10:40 AM  Follow Up Plan:   Telephone follow up appointment date/time:  Wednesday, October 1 at 12:45 PM  Clayborne Ly RN BSN CCM [place]  [facility], [facility] Nurse Care Coordinator  Direct Dial: [phone] Website: [email or web address]

## 2024-06-06 ENCOUNTER — Telehealth: Payer: Self-pay

## 2024-06-15 ENCOUNTER — Ambulatory Visit

## 2024-06-15 VITALS — BP 100/74 | HR 99 | Temp 98.9°F | Ht 62.0 in | Wt 158.0 lb

## 2024-06-15 DIAGNOSIS — Z23 Encounter for immunization: Secondary | ICD-10-CM | POA: Diagnosis not present

## 2024-06-15 NOTE — Progress Notes (Signed)
 Patient is in office today for a nurse visit for Immunization. Patient Injection was given in the  Right deltoid. Patient tolerated injection well.

## 2024-06-21 DIAGNOSIS — M545 Low back pain, unspecified: Secondary | ICD-10-CM | POA: Diagnosis not present

## 2024-06-21 DIAGNOSIS — E049 Nontoxic goiter, unspecified: Secondary | ICD-10-CM | POA: Diagnosis not present

## 2024-06-21 DIAGNOSIS — K219 Gastro-esophageal reflux disease without esophagitis: Secondary | ICD-10-CM | POA: Diagnosis not present

## 2024-06-21 DIAGNOSIS — A609 Anogenital herpesviral infection, unspecified: Secondary | ICD-10-CM | POA: Diagnosis not present

## 2024-06-21 DIAGNOSIS — M3500 Sicca syndrome, unspecified: Secondary | ICD-10-CM | POA: Diagnosis not present

## 2024-06-21 DIAGNOSIS — Z833 Family history of diabetes mellitus: Secondary | ICD-10-CM | POA: Diagnosis not present

## 2024-06-21 DIAGNOSIS — K589 Irritable bowel syndrome without diarrhea: Secondary | ICD-10-CM | POA: Diagnosis not present

## 2024-06-21 DIAGNOSIS — I700 Atherosclerosis of aorta: Secondary | ICD-10-CM | POA: Diagnosis not present

## 2024-06-21 DIAGNOSIS — Z7951 Long term (current) use of inhaled steroids: Secondary | ICD-10-CM | POA: Diagnosis not present

## 2024-06-21 DIAGNOSIS — I2510 Atherosclerotic heart disease of native coronary artery without angina pectoris: Secondary | ICD-10-CM | POA: Diagnosis not present

## 2024-06-21 DIAGNOSIS — Z8249 Family history of ischemic heart disease and other diseases of the circulatory system: Secondary | ICD-10-CM | POA: Diagnosis not present

## 2024-06-21 DIAGNOSIS — M1990 Unspecified osteoarthritis, unspecified site: Secondary | ICD-10-CM | POA: Diagnosis not present

## 2024-06-21 DIAGNOSIS — I129 Hypertensive chronic kidney disease with stage 1 through stage 4 chronic kidney disease, or unspecified chronic kidney disease: Secondary | ICD-10-CM | POA: Diagnosis not present

## 2024-06-21 DIAGNOSIS — E876 Hypokalemia: Secondary | ICD-10-CM | POA: Diagnosis not present

## 2024-06-21 DIAGNOSIS — E785 Hyperlipidemia, unspecified: Secondary | ICD-10-CM | POA: Diagnosis not present

## 2024-06-21 DIAGNOSIS — E1122 Type 2 diabetes mellitus with diabetic chronic kidney disease: Secondary | ICD-10-CM | POA: Diagnosis not present

## 2024-06-21 DIAGNOSIS — R011 Cardiac murmur, unspecified: Secondary | ICD-10-CM | POA: Diagnosis not present

## 2024-06-21 DIAGNOSIS — J309 Allergic rhinitis, unspecified: Secondary | ICD-10-CM | POA: Diagnosis not present

## 2024-06-21 DIAGNOSIS — J4489 Other specified chronic obstructive pulmonary disease: Secondary | ICD-10-CM | POA: Diagnosis not present

## 2024-06-21 DIAGNOSIS — G4700 Insomnia, unspecified: Secondary | ICD-10-CM | POA: Diagnosis not present

## 2024-06-21 DIAGNOSIS — N189 Chronic kidney disease, unspecified: Secondary | ICD-10-CM | POA: Diagnosis not present

## 2024-06-23 ENCOUNTER — Other Ambulatory Visit: Payer: Self-pay | Admitting: Nurse Practitioner

## 2024-06-23 DIAGNOSIS — N183 Chronic kidney disease, stage 3 unspecified: Secondary | ICD-10-CM

## 2024-06-26 ENCOUNTER — Telehealth: Payer: Self-pay

## 2024-06-26 NOTE — Patient Instructions (Signed)
 [name] - I am sorry I was unable to reach you today for our scheduled appointment. I work with Georgina Speaks, FNP and am calling to support your healthcare needs. Please contact me at [phone] at your earliest convenience. I look forward to speaking with you soon.   Thank you,   Clayborne Ly RN BSN CCM [place]  [facility], [facility] Nurse Care Coordinator  Direct Dial: [phone] Website: [email or web address]

## 2024-07-16 ENCOUNTER — Other Ambulatory Visit: Payer: Self-pay | Admitting: Pulmonary Disease

## 2024-07-16 DIAGNOSIS — J441 Chronic obstructive pulmonary disease with (acute) exacerbation: Secondary | ICD-10-CM

## 2024-07-18 ENCOUNTER — Other Ambulatory Visit: Payer: Self-pay

## 2024-07-18 NOTE — Patient Outreach (Signed)
 Complex Care Management   Visit Note  07/18/2024  Name:  [name] MRN: [id number] DOB: [birth year]  Situation: Referral received for Complex Care Management related to COPD and Diabetes with Complications, hot flashes, night sweats. I obtained verbal consent from Patient.  Visit completed with Patient on the phone.  Background:   Past Medical History:  Diagnosis Date   Arthritis    Asthma    COPD (chronic obstructive pulmonary disease) (HCC)    Depression    Diabetes mellitus without complication (HCC)    Diverticulitis    GERD (gastroesophageal reflux disease)    Hyperlipidemia    Hypertension 06/15/2016   left lung ca dx'd 2016   Thyroid  nodule     Assessment: Patient Reported Symptoms:  Cognitive Cognitive Status: Able to follow simple commands, Normal speech and language skills Cognitive/Intellectual Conditions Management [RPT]: None reported or documented in medical history or problem list   Health Maintenance Behaviors: Annual physical exam, Healthy diet Health Facilitated by: Healthy diet, Rest  Neurological Neurological Review of Symptoms: No symptoms reported    HEENT HEENT Symptoms Reported: No symptoms reported      Cardiovascular Cardiovascular Symptoms Reported: No symptoms reported    Respiratory Respiratory Symptoms Reported: Shortness of breath Other Respiratory Symptoms: shortness of breath on exertion Additional Respiratory Details: patient is established with Dr. Jude for COPD Respiratory Management Strategies: Adequate rest, Breathing techniques, Routine screening, Medication therapy Respiratory Self-Management Outcome: 4 (good)  Endocrine Endocrine Symptoms Reported: No symptoms reported Is patient diabetic?: Yes Is patient checking blood sugars at home?: Yes List most recent blood sugar readings, include date and time of day: FBS 95 Endocrine Self-Management Outcome: 4 (good)  Gastrointestinal Gastrointestinal Symptoms Reported: No  symptoms reported      Genitourinary Genitourinary Symptoms Reported: No symptoms reported    Integumentary Integumentary Symptoms Reported: No symptoms reported    Musculoskeletal Musculoskelatal Symptoms Reviewed: No symptoms reported   Falls in the past year?: No Number of falls in past year: 1 or less Was there an injury with Fall?: No Fall Risk Category Calculator: 0 Patient Fall Risk Level: Low Fall Risk Patient at Risk for Falls Due to: No Fall Risks Fall risk Follow up: Falls evaluation completed  Psychosocial Psychosocial Symptoms Reported: No symptoms reported   Major Change/Loss/Stressor/Fears (CP): Denies      07/18/2024    PHQ2-9 Depression Screening   Tinisha Etzkorn interest or pleasure in doing things    Feeling down, depressed, or hopeless    PHQ-2 - Total Score    Trouble falling or staying asleep, or sleeping too much    Feeling tired or having Shanay Woolman energy    Poor appetite or overeating     Feeling bad about yourself - or that you are a failure or have let yourself or your family down    Trouble concentrating on things, such as reading the newspaper or watching television    Moving or speaking so slowly that other people could have noticed.  Or the opposite - being so fidgety or restless that you have been moving around a lot more than usual    Thoughts that you would be better off dead, or hurting yourself in some way    PHQ2-9 Total Score    If you checked off any problems, how difficult have these problems made it for you to do your work, take care of things at home, or get along with other people    Depression Interventions/Treatment  There were no vitals filed for this visit. Pain Scale: Not given for pain  Medications Reviewed Today     Reviewed by Morgan Clayborne CROME, RN (Registered Nurse) on 07/18/24 at 1354  Med List Status: <None>   Medication Order Taking? Sig Documenting Provider Last Dose Status Informant  albuterol  (PROVENTIL ) (2.5 MG/3ML)  0.083% nebulizer solution [id number]  Take 3 mLs (2.5 mg total) by nebulization every 6 (six) hours as needed for wheezing or shortness of breath. Parrett, Madelin RAMAN, NP  Active   albuterol  (VENTOLIN  HFA) 108 (90 Base) MCG/ACT inhaler [id number]  Inhale 2 puffs into the lungs every 6 (six) hours as needed for wheezing or shortness of breath. Jude Harden GAILS, MD  Active   aspirin  81 MG tablet [id number]  Take 81 mg by mouth every evening.  [provider]  Active Self  Blood Glucose Monitoring Suppl (TRUE METRIX AIR GLUCOSE METER) w/Device KIT [id number]  Use to check blood sugar 4 times a day. Dx code e11.65 Georgina Speaks, FNP  Active   Calcium  Carbonate-Vitamin D  600-400 MG-UNIT tablet [id number]  Take 1 tablet by mouth daily. Jarold Medici, MD  Active   cetirizine (ZYRTEC) 10 MG tablet [id number]  Take 10 mg by mouth daily. [provider]  Active   cycloSPORINE  (RESTASIS ) 0.05 % ophthalmic emulsion [id number]  1 drop 2 (two) times daily. [provider]  Active   donepezil  (ARICEPT ) 10 MG tablet [id number]  TAKE 1 TABLET BY MOUTH EVERY DAY IN THE KARNA Georgina, Speaks, FNP  Active   ezetimibe  (ZETIA ) 10 MG tablet [id number]  TAKE 1 TABLET BY MOUTH EVERY DAY Georgina Speaks, FNP  Active   fluticasone  [facility]) 50 MCG/ACT nasal spray [id number]  Place into both nostrils daily. [provider]  Active   glucose blood (TRUE METRIX BLOOD GLUCOSE TEST) test strip [id number]  USE AS INSTRUCTED TO CHECK BLOOD SUGAR TWICE DAILY Georgina Speaks, FNP  Active   JARDIANCE  10 MG TABS tablet [id number]  TAKE 1 TABLET BY MOUTH EVERY DAY Moore, Janece, FNP  Active   lubiprostone  (AMITIZA ) 8 MCG capsule [id number]  TAKE 1 CAPSULE (8 MCG TOTAL) BY MOUTH DAILY WITH BREAKFAST. Georgina Speaks, FNP  Active   magnesium oxide (MAG-OX) 400 MG tablet [id number]  Take 400 mg by mouth at bedtime. [provider]  Active   Melatonin 2.5 MG CAPS [id number]  Take 2.5 mg by mouth at bedtime as needed (sleep).   [provider]  Active Self  mirtazapine  (REMERON ) 15 MG tablet [id number]  TAKE 1 TABLET BY MOUTH EVERYDAY AT BEDTIME Georgina Speaks, FNP  Active   Multiple Vitamin (MULTIVITAMIN) tablet [id number]  Take 1 tablet by mouth daily. [provider]  Active Self  niacin  (VITAMIN B3) 500 MG ER tablet [id number]  TAKE 1 TABLET BY MOUTH EVERY DAY IN THE KARNA Georgina Speaks, FNP  Active   OVER THE COUNTER MEDICATION [id number]  Take 1 tablet by mouth daily. [provider]  Active Self           Med Note SYDELL, LOA DEL   Thu Jun 23, 2017 11:19 AM) Mega red 4 in 1  pantoprazole  (PROTONIX ) 40 MG tablet [id number]  Take 1 tablet (40 mg total) by mouth 2 (two) times daily. Georgina Speaks, FNP  Active   potassium chloride  SA (KLOR-CON  M) 20 MEQ tablet [id number]  TAKE 1 TABLET BY MOUTH EVERY DAY WITH FOOD Georgina Speaks, FNP  Active   predniSONE  (DELTASONE ) 10 MG tablet [id number]  Take  4 tabs  daily with food x 4 days, then 3 tabs daily x 4 days, then 2 tabs daily x 4 days, then 1 tab daily x4 days then stop. #40 Jude Harden GAILS, MD  Active   Probiotic Product (PROBIOTIC PO) [id number]  Take by mouth daily at 6 (six) AM. [provider]  Active   rosuvastatin  (CRESTOR ) 10 MG tablet [id number]  TAKE 1 TABLET BY MOUTH EVERY DAY Georgina Speaks, FNP  Active   TRELEGY ELLIPTA  100-62.5-25 MCG/ACT AEPB [id number]  INHALE 1 PUFF ONE TIME DAILY Jude Harden GAILS, MD  Active   TRUEplus Lancets 33G MISC [id number]  TEST BLOOD SUGAR AS DIRECTED Georgina Speaks, FNP  Active   valACYclovir  (VALTREX ) 500 MG tablet [id number]  Take 1 tablet (500 mg total) by mouth daily. Georgina Speaks, FNP  Active   White Petrolatum -Mineral Oil (SYSTANE NIGHTTIME OP) [id number]  Place 1 drop into both eyes at bedtime as needed (for dry eyes).  [provider]  Active Self           Med Note ALONA FREDDIE CHRISTELLA Pablo Sep 25, 2018  4:03 PM)    Med List Note Murray [place], [place] 02/22/19 1117): caltrate               Recommendation:   PCP Follow-up  Follow Up Plan:   Closing From:  Complex Care Management  Clayborne Ly RN BSN CCM [facility]  [facility], [facility] Nurse Care Coordinator  Direct Dial: [phone] Website: [email or web address]

## 2024-07-18 NOTE — Patient Instructions (Signed)
 Visit Information  Thank you for taking time to visit with me today.    Please call 1-800-273-TALK (toll free, 24 hour hotline) if you are experiencing a Mental Health or Behavioral Health Crisis or need someone to talk to.  Clayborne Ly RN BSN CCM [place]  [facility], [facility] Nurse Care Coordinator  Direct Dial: [phone] Website: [email or web address]

## 2024-08-07 ENCOUNTER — Telehealth: Payer: Self-pay | Admitting: Pharmacist

## 2024-08-07 DIAGNOSIS — E1122 Type 2 diabetes mellitus with diabetic chronic kidney disease: Secondary | ICD-10-CM

## 2024-08-07 NOTE — Progress Notes (Signed)
 08/07/2024 Name: [name] MRN: [id number] DOB: 1941/05/26  Chief Complaint  Patient presents with   Medication Management    Diabetes    [name] is a 83 y.o. year old female who presented for a telephone visit.   They were referred to the pharmacist by their PCP for assistance in managing diabetes.    Subjective:  [name] is an 83 year old female with multiple medical conditions including but not limited un:jmuympupd, asthma, COPD, hypertension, type 2 diabetes, GERD, depression and a history of lung cancer.  She was referred for diabetes management because her HgA1c increased to 7.6 from 6.9%.   Care Team: Primary Care Provider: Georgina Speaks, FNP ; Next Scheduled Visit: 09/11/2023   Medication Access/Adherence  Current Pharmacy:  [id number] [place], [place] - [address] [place] [address] Phone: [phone] Fax: [phone]  [facility] - [place], [place] - [address] [place] [address] Phone: [phone] Fax: [phone]   Patient reports affordability concerns with their medications: No -Dually Eligible  Patient reports access/transportation concerns to their pharmacy: No  Patient reports adherence concerns with their medications:  No      Diabetes:  Current medications:   Jardiance  10 mg  Current glucose readings:  134, 132, 122, 125, Highest-139mg /dl--fasting    Patient denies hypoglycemic s/sx including  dizziness, shakiness, sweating. Patient denies hyperglycemic symptoms including  polyuria, polydipsia, polyphagia, nocturia, neuropathy, blurred vision.  Current medication access support: Is dually-eligible  Macrovascular and Microvascular Risk Reduction:  Statin? yes (Rosuvastatin  Last filled )06/21/2024  #90 ACEi/ARB? no Last urinary albumin /creatinine ratio:  Lab Results  Component Value Date   MICRALBCREAT 24 01/05/2024    MICRALBCREAT 18 01/04/2023   MICRALBCREAT 30-300 08/24/2021   MICRALBCREAT 30 03/31/2020   MICRALBCREAT <30 02/15/2019   Last eye exam:  Lab Results  Component Value Date   HMDIABEYEEXA No Retinopathy 02/23/2024   Last foot exam: 05/09/2024 Tobacco Use:  Tobacco Use: Medium Risk (06/15/2024)   Patient History    Smoking Tobacco Use: Former    Smokeless Tobacco Use: Never    Passive Exposure: Not on file     Objective:  Lab Results  Component Value Date   HGBA1C 7.4 (H) 05/09/2024    Lab Results  Component Value Date   CREATININE 1.05 (H) 05/09/2024   BUN 14 05/09/2024   NA 143 05/09/2024   K 4.2 05/09/2024   CL 101 05/09/2024   CO2 25 05/09/2024    Lab Results  Component Value Date   CHOL 127 05/09/2024   HDL 55 05/09/2024   LDLCALC 53 05/09/2024   TRIG 105 05/09/2024   CHOLHDL 2.3 05/09/2024    Medications Reviewed Today     Reviewed by Jolee Cassius JINNY, RPH (Pharmacist) on 08/07/24 at 1308  Med List Status: <None>   Medication Order Taking? Sig Documenting Provider Last Dose Status Informant  albuterol  (PROVENTIL ) (2.5 MG/3ML) 0.083% nebulizer solution [id number] Yes Take 3 mLs (2.5 mg total) by nebulization every 6 (six) hours as needed for wheezing or shortness of breath. Parrett, Madelin RAMAN, NP  Active   albuterol  (VENTOLIN  HFA) 108 (90 Base) MCG/ACT inhaler [id number] Yes Inhale 2 puffs into the lungs every 6 (six) hours as needed for wheezing or shortness of breath. Jude Harden GAILS, MD  Active   aspirin  81 MG tablet [id number] Yes Take 81 mg by mouth every evening.  [provider]  Active Self  Blood Glucose Monitoring Suppl (TRUE METRIX AIR GLUCOSE METER) w/Device KIT [id number] Yes Use to check blood sugar 4 times a day. Dx code e11.65 Georgina Speaks, FNP  Active   Calcium  Carbonate-Vitamin D  600-400 MG-UNIT tablet [id number] Yes Take 1 tablet by mouth daily. Jarold Medici, MD  Active   cetirizine (ZYRTEC) 10 MG tablet [id number] Yes Take 10 mg by mouth  daily. [provider]  Active   cycloSPORINE  (RESTASIS ) 0.05 % ophthalmic emulsion [id number] Yes 1 drop 2 (two) times daily. [provider]  Active   donepezil  (ARICEPT ) 10 MG tablet [id number] Yes TAKE 1 TABLET BY MOUTH EVERY DAY IN THE KARNA Georgina, Speaks, FNP  Active   ezetimibe  (ZETIA ) 10 MG tablet [id number] Yes TAKE 1 TABLET BY MOUTH EVERY DAY Georgina Speaks, FNP  Active   fluticasone  [facility]) 50 MCG/ACT nasal spray [id number]  Place into both nostrils daily.  Patient not taking: Reported on 08/07/2024   [provider]  Active   glucose blood (TRUE METRIX BLOOD GLUCOSE TEST) test strip [id number] Yes USE AS INSTRUCTED TO CHECK BLOOD SUGAR TWICE DAILY Georgina Speaks, FNP  Active   JARDIANCE  10 MG TABS tablet [id number] Yes TAKE 1 TABLET BY MOUTH EVERY DAY Georgina Speaks, FNP  Active   lubiprostone  (AMITIZA ) 8 MCG capsule [id number] Yes TAKE 1 CAPSULE (8 MCG TOTAL) BY MOUTH DAILY WITH BREAKFAST. Georgina Speaks, FNP  Active   MAGNESIUM GLYCINATE PO [id number] Yes Take 2 tablets by mouth daily. [provider]  Active     Discontinued 08/07/24 1307 (Patient Preference)   Melatonin 2.5 MG CAPS [id number] Yes Take 2.5 mg by mouth at bedtime as needed (sleep).  [provider]  Active Self  mirtazapine  (REMERON ) 15 MG tablet [id number] Yes TAKE 1 TABLET BY MOUTH EVERYDAY AT BEDTIME Georgina Speaks, FNP  Active   Multiple Vitamin (MULTIVITAMIN) tablet [id number] Yes Take 1 tablet by mouth daily. [provider]  Active Self  niacin  (VITAMIN B3) 500 MG ER tablet [id number] Yes TAKE 1 TABLET BY MOUTH EVERY DAY IN THE KARNA Georgina Speaks, FNP  Active   OVER THE COUNTER MEDICATION [id number] Yes Take 1 tablet by mouth daily. [provider]  Active Self           Med Note SYDELL, LOA DEL   Thu Jun 23, 2017 11:19 AM) Mega red 4 in 1  pantoprazole  (PROTONIX ) 40 MG tablet [id number] Yes Take 1 tablet (40 mg total) by mouth 2 (two) times daily. Georgina Speaks, FNP  Active   potassium chloride  SA (KLOR-CON  M) 20 MEQ tablet [id number] Yes TAKE 1 TABLET BY MOUTH EVERY DAY WITH FOOD Georgina Speaks, FNP  Active     Discontinued 08/07/24 1308 (Completed Course)   Probiotic Product (PROBIOTIC PO) [id number] Yes Take by mouth daily at 6 (six) AM. [provider]  Active   rosuvastatin  (CRESTOR ) 10 MG tablet [id number] Yes TAKE 1 TABLET BY MOUTH EVERY DAY Georgina Speaks, FNP  Active   TRELEGY ELLIPTA  100-62.5-25 MCG/ACT AEPB [id number] Yes INHALE 1 PUFF ONE TIME DAILY Jude Harden GAILS, MD  Active   TRUEplus Lancets 33G MISC [id number] Yes TEST BLOOD SUGAR AS DIRECTED Georgina Speaks, FNP  Active   valACYclovir  (VALTREX ) 500 MG tablet [id number] Yes Take 1 tablet (500 mg total) by mouth daily. Georgina Speaks, FNP  Active   White Petrolatum -Mineral Oil (SYSTANE NIGHTTIME OP) [id number] Yes Place 1 drop into both eyes at bedtime as needed (for dry eyes).  [provider]  Active Self           Med Note ALONA FREDDIE CHRISTELLA Pablo Sep 25, 2018  4:03 PM)    Med List Note Murray [place], [place] 02/22/19 1117): caltrate                  06/15/2024   10:50 AM 05/10/2024    2:35 PM 05/09/2024    9:59 AM  Vitals with BMI  Height 5' 2 5' 3 5' 3  Weight 158 lbs 158 lbs 160 lbs  BMI 28.89 28 28.35  Systolic 100 134 889  Diastolic 74 66 60  Pulse 99 90 96     Assessment/Plan:   Diabetes: - Currently controlled; goal A1c <7% goal 7.5-8% with her age. Cardiorenal risk reduction is opportunities for improvement.. Blood pressure is at goal <130/80. LDL is at goal.  - Reviewed goal A1c, goal fasting, and goal 2 hour post prandial glucose. Recommended to check glucose once daily  Follow Up Plan:    Upcoming appointment with Janece 09/11/23  Follow up with Patient after the upcoming appointment.   Cassius DOROTHA Brought, PharmD, BCACP Clinical Pharmacist [phone]

## 2024-09-04 ENCOUNTER — Other Ambulatory Visit: Payer: Self-pay | Admitting: Nurse Practitioner

## 2024-09-04 DIAGNOSIS — G4700 Insomnia, unspecified: Secondary | ICD-10-CM

## 2024-09-07 LAB — HM MAMMOGRAPHY

## 2024-09-10 ENCOUNTER — Telehealth: Payer: Self-pay

## 2024-09-10 ENCOUNTER — Ambulatory Visit: Payer: Self-pay | Admitting: Nurse Practitioner

## 2024-09-10 NOTE — Telephone Encounter (Signed)
 LMOVM letting patient know that JM is out of the office sick and would she available to virtual.

## 2024-09-11 ENCOUNTER — Encounter: Payer: Self-pay | Admitting: Nurse Practitioner

## 2024-09-12 ENCOUNTER — Other Ambulatory Visit: Payer: Self-pay | Admitting: Nurse Practitioner

## 2024-09-12 ENCOUNTER — Ambulatory Visit: Admitting: Nurse Practitioner

## 2024-09-17 ENCOUNTER — Ambulatory Visit: Admitting: Nurse Practitioner

## 2024-09-17 ENCOUNTER — Encounter: Payer: Self-pay | Admitting: Nurse Practitioner

## 2024-09-17 VITALS — BP 128/70 | HR 100 | Temp 98.4°F | Ht 62.0 in | Wt 162.6 lb

## 2024-09-17 DIAGNOSIS — I129 Hypertensive chronic kidney disease with stage 1 through stage 4 chronic kidney disease, or unspecified chronic kidney disease: Secondary | ICD-10-CM

## 2024-09-17 DIAGNOSIS — Z2821 Immunization not carried out because of patient refusal: Secondary | ICD-10-CM

## 2024-09-17 DIAGNOSIS — J441 Chronic obstructive pulmonary disease with (acute) exacerbation: Secondary | ICD-10-CM

## 2024-09-17 DIAGNOSIS — J3089 Other allergic rhinitis: Secondary | ICD-10-CM | POA: Diagnosis not present

## 2024-09-17 DIAGNOSIS — E1122 Type 2 diabetes mellitus with diabetic chronic kidney disease: Secondary | ICD-10-CM

## 2024-09-17 DIAGNOSIS — N183 Chronic kidney disease, stage 3 unspecified: Secondary | ICD-10-CM

## 2024-09-17 DIAGNOSIS — Z85110 Personal history of malignant carcinoid tumor of bronchus and lung: Secondary | ICD-10-CM | POA: Diagnosis not present

## 2024-09-17 DIAGNOSIS — E782 Mixed hyperlipidemia: Secondary | ICD-10-CM

## 2024-09-17 DIAGNOSIS — Z6829 Body mass index (BMI) 29.0-29.9, adult: Secondary | ICD-10-CM | POA: Diagnosis not present

## 2024-09-17 MED ORDER — CETIRIZINE HCL 10 MG PO TABS: 10.0000 mg | ORAL_TABLET | Freq: Every day | ORAL | 1 refills | Status: AC

## 2024-09-17 MED ORDER — FLUTICASONE PROPIONATE 50 MCG/ACT NA SUSP: 1.0000 | Freq: Every day | NASAL | 5 refills | Status: AC

## 2024-09-17 NOTE — Assessment & Plan Note (Signed)
 Rx for flonase  and loratadine  sent to the pharmacy

## 2024-09-17 NOTE — Assessment & Plan Note (Addendum)
Continue f/u with Pulmonology.  

## 2024-09-17 NOTE — Assessment & Plan Note (Addendum)
 Cholesterol levels are stable. Continue current medications

## 2024-09-17 NOTE — Assessment & Plan Note (Addendum)
 Blood pressure is well controlled. Continue current medications.

## 2024-09-17 NOTE — Assessment & Plan Note (Signed)
 Blood glucose levels and A1c to be monitored. - Ordered blood work for A1c and relevant parameters.

## 2024-09-17 NOTE — Progress Notes (Signed)
 [name], CMA,acting as a neurosurgeon for Shannon Ada, [name].,have documented all relevant documentation on the behalf of Shannon Ada, [name],as directed by  Shannon Ada, [name] while in the presence of Shannon Ada, [name].  Subjective:  Patient ID: [name] , female    DOB: [birth year] , 84 y.o.   MRN: [id number]  Chief Complaint  Patient presents with   Hypertension    Patient presents today for a bp and dm follow up, Patient reports compliance with medication. Patient denies any chest pain, SOB, or headaches. Patient has no concerns today.     HPI  Discussed the use of AI scribe software for clinical note transcription with the patient, who gave verbal consent to proceed.  History of Present Illness [name] is an 84 year old female who presents with a persistent cough and shortness of breath.  She has been experiencing a persistent cough that appeared suddenly and has significantly impacted her ability to function, which is unusual for her.  She experiences shortness of breath that worsens with activity and improves with rest. This has been ongoing for a while, and she becomes out of breath with minimal exertion. Sitting down and relaxing for about ten minutes helps her recover.  She inquires about getting a prescription for Zyrtec  and Flonase , which she learned could be covered by her insurance.  She had a mammogram on January 2nd, but the results are not yet available. She is scheduled for a bone density test in March at [facility].  She has not seen any other doctors since her last visit, except for a visit to Dr. Jude in September. She has spoken with the pharmacist, but there have been no changes in her medications.  She keeps masks in her car to remind herself to wear them when going out, especially in crowded places. Her daughter stays at home, and she is cautious about avoiding COVID-19, pneumonia, or flu.  Past Medical History:  Diagnosis Date   Arthritis    Asthma     COPD (chronic obstructive pulmonary disease) (HCC)    Depression    Diabetes mellitus without complication (HCC)    Diverticulitis    Epistaxis, recurrent 09/12/2020   GERD (gastroesophageal reflux disease)    Hyperlipidemia    Hypertension 06/15/2016   Left lower quadrant pain 02/16/2021   left lung ca dx'd 2016   Thyroid  nodule      Family History  Problem Relation Age of Onset   Renal cancer Brother    Diabetes Mother    Hypertension Mother    Renal Disease Mother    Healthy Father     Current Medications[1]   Allergies[2]   Review of Systems  Constitutional: Negative.  Negative for fatigue.  Respiratory: Negative.    Cardiovascular: Negative.   Endocrine: Negative for polydipsia, polyphagia and polyuria.  Neurological: Negative.  Negative for dizziness and headaches.  Psychiatric/Behavioral: Negative.       Today's Vitals   09/17/24 1412  BP: 128/70  Pulse: 100  Temp: 98.4 F (36.9 C)  TempSrc: Oral  Weight: 162 lb 9.6 oz (73.8 kg)  Height: 5' 2 (1.575 m)  PainSc: 0-No pain   Body mass index is 29.74 kg/m.  Wt Readings from Last 3 Encounters:  09/17/24 162 lb 9.6 oz (73.8 kg)  06/15/24 158 lb (71.7 kg)  05/10/24 158 lb (71.7 kg)      Objective:  Physical Exam Vitals and nursing note reviewed.  Constitutional:      General:  She is not in acute distress.    Appearance: Normal appearance.  Cardiovascular:     Rate and Rhythm: Normal rate and regular rhythm.     Pulses: Normal pulses.     Heart sounds: Normal heart sounds. No murmur heard. Pulmonary:     Effort: Pulmonary effort is normal. No respiratory distress.     Breath sounds: No wheezing.     Comments: Continues to have some shortness of breath with exertion/movement Skin:    General: Skin is warm and dry.     Capillary Refill: Capillary refill takes less than 2 seconds.  Neurological:     General: No focal deficit present.     Mental Status: She is alert and oriented to person, place,  and time.     Cranial Nerves: No cranial nerve deficit.     Motor: No weakness.  Psychiatric:        Mood and Affect: Mood normal.        Behavior: Behavior normal.        Thought Content: Thought content normal.        Judgment: Judgment normal.      Assessment And Plan:   Assessment & Plan Hypertensive nephropathy Blood pressure is well controlled. Continue current medications.  Diabetes mellitus with stage 3 chronic kidney disease (HCC) Blood glucose levels and A1c to be monitored. - Ordered blood work for A1c and relevant parameters. Mixed hyperlipidemia Cholesterol levels are stable. 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 Educated on energy conservation and mask use. - Encouraged mask use in crowded places. - continue f/u with Pulmonology Non-seasonal allergic rhinitis, unspecified trigger Rx for flonase  and loratadine  sent to the pharmacy  Orders Placed This Encounter  Procedures   Hemoglobin A1c   Lipid panel   BMP8+eGFR    Return for keep same next.  Patient was given opportunity to ask questions. Patient verbalized understanding of the plan and was able to repeat key elements of the plan. All questions were answered to their satisfaction.    Shannon Shannon Ada, [name], have reviewed all documentation for this visit. The documentation on 09/17/2024 for the exam, diagnosis, procedures, and orders are all accurate and complete.   IF YOU HAVE BEEN REFERRED TO A SPECIALIST, IT MAY TAKE 1-2 WEEKS TO SCHEDULE/PROCESS THE REFERRAL. IF YOU HAVE NOT HEARD FROM US /SPECIALIST IN TWO WEEKS, PLEASE GIVE US  A CALL AT [phone].      [1]  Current Outpatient Medications:    albuterol  (PROVENTIL ) (2.5 MG/3ML) 0.083% nebulizer solution, Take 3 mLs (2.5 mg total)  by nebulization every 6 (six) hours as needed for wheezing or shortness of breath., Disp: 75 mL, Rfl: 12   albuterol  (VENTOLIN  HFA) 108 (90 Base) MCG/ACT inhaler, Inhale 2 puffs into the lungs every 6 (six) hours as needed for wheezing or shortness of breath., Disp: 8.5 each, Rfl: 3   aspirin  81 MG tablet, Take 81 mg by mouth every evening. , Disp: , Rfl:    Blood Glucose Monitoring Suppl (TRUE METRIX AIR GLUCOSE METER) w/Device KIT, Use to check blood sugar 4 times a day. Dx code e11.65, Disp: 1 kit, Rfl: 2   Calcium  Carbonate-Vitamin D  600-400 MG-UNIT tablet, Take 1 tablet by mouth daily., Disp:  , Rfl:    cycloSPORINE (RESTASIS) 0.05 % ophthalmic emulsion, 1 drop 2 (two)  times daily., Disp: , Rfl:    donepezil  (ARICEPT ) 10 MG tablet, TAKE 1 TABLET BY MOUTH EVERY DAY IN THE EVENING, Disp: 90 tablet, Rfl: 1   ezetimibe  (ZETIA ) 10 MG tablet, TAKE 1 TABLET BY MOUTH EVERY DAY, Disp: 90 tablet, Rfl: 2   glucose blood (TRUE METRIX BLOOD GLUCOSE TEST) test strip, USE AS INSTRUCTED TO CHECK BLOOD SUGAR TWICE DAILY, Disp: 200 strip, Rfl: 3   JARDIANCE  10 MG TABS tablet, TAKE 1 TABLET BY MOUTH EVERY DAY, Disp: 30 tablet, Rfl: 2   lubiprostone  (AMITIZA ) 8 MCG capsule, TAKE 1 CAPSULE (8 MCG TOTAL) BY MOUTH DAILY WITH BREAKFAST., Disp: 90 capsule, Rfl: 1   MAGNESIUM GLYCINATE PO, Take 2 tablets by mouth daily., Disp: , Rfl:    Melatonin 2.5 MG CAPS, Take 2.5 mg by mouth at bedtime as needed (sleep). , Disp: , Rfl:    mirtazapine  (REMERON ) 15 MG tablet, TAKE 1 TABLET BY MOUTH EVERYDAY AT BEDTIME, Disp: 90 tablet, Rfl: 1   Multiple Vitamin (MULTIVITAMIN) tablet, Take 1 tablet by mouth daily., Disp: , Rfl:    niacin  (VITAMIN B3) 500 MG ER tablet, TAKE 1 TABLET BY MOUTH EVERY DAY IN THE EVENING, Disp: 90 tablet, Rfl: 3   OVER THE COUNTER MEDICATION, Take 1 tablet by mouth daily., Disp: , Rfl:    pantoprazole  (PROTONIX ) 40 MG tablet, Take 1 tablet (40 mg total) by mouth 2 (two) times daily., Disp: 180 tablet, Rfl:  1   potassium chloride  SA (KLOR-CON  M) 20 MEQ tablet, TAKE 1 TABLET BY MOUTH EVERY DAY WITH FOOD, Disp: 90 tablet, Rfl: 1   Probiotic Product (PROBIOTIC PO), Take by mouth daily at 6 (six) AM., Disp: , Rfl:    rosuvastatin  (CRESTOR ) 10 MG tablet, TAKE 1 TABLET BY MOUTH EVERY DAY, Disp: 90 tablet, Rfl: 1   TRELEGY ELLIPTA  100-62.5-25 MCG/ACT AEPB, INHALE 1 PUFF ONE TIME DAILY, Disp: 180 each, Rfl: 3   TRUEplus Lancets 33G MISC, TEST BLOOD SUGAR AS DIRECTED, Disp: 200 each, Rfl: 3   valACYclovir  (VALTREX ) 500 MG tablet, Take 1 tablet (500 mg total) by mouth daily., Disp: 90 tablet, Rfl: 0   White Petrolatum -Mineral Oil (SYSTANE NIGHTTIME OP), Place 1 drop into both eyes at bedtime as needed (for dry eyes). , Disp: , Rfl:    cetirizine  (ZYRTEC ) 10 MG tablet, Take 1 tablet (10 mg total) by mouth daily., Disp: 90 tablet, Rfl: 1   fluticasone  (FLONASE ) 50 MCG/ACT nasal spray, Place 1 spray into both nostrils daily., Disp: 16 g, Rfl: 5 [2] No Known Allergies

## 2024-09-18 LAB — BMP8+EGFR
BUN/Creatinine Ratio: 14 (ref 12–28)
BUN: 16 mg/dL (ref 8–27)
CO2: 25 mmol/L (ref 20–29)
Calcium: 9.9 mg/dL (ref 8.7–10.3)
Chloride: 103 mmol/L (ref 96–106)
Creatinine, Ser: 1.15 mg/dL — ABNORMAL HIGH (ref 0.57–1.00)
Glucose: 155 mg/dL — ABNORMAL HIGH (ref 70–99)
Potassium: 4.3 mmol/L (ref 3.5–5.2)
Sodium: 144 mmol/L (ref 134–144)
eGFR: 47 mL/min/1.73 — ABNORMAL LOW

## 2024-09-18 LAB — HEMOGLOBIN A1C
Est. average glucose Bld gHb Est-mCnc: 166 mg/dL
Hgb A1c MFr Bld: 7.4 % — ABNORMAL HIGH (ref 4.8–5.6)

## 2024-09-18 LAB — LIPID PANEL
Chol/HDL Ratio: 2.4 ratio (ref 0.0–4.4)
Cholesterol, Total: 134 mg/dL (ref 100–199)
HDL: 57 mg/dL
LDL Chol Calc (NIH): 53 mg/dL (ref 0–99)
Triglycerides: 142 mg/dL (ref 0–149)
VLDL Cholesterol Cal: 24 mg/dL (ref 5–40)

## 2024-09-22 ENCOUNTER — Other Ambulatory Visit: Payer: Self-pay | Admitting: Nurse Practitioner

## 2024-09-22 DIAGNOSIS — R6881 Early satiety: Secondary | ICD-10-CM

## 2024-09-24 ENCOUNTER — Telehealth: Payer: Self-pay | Admitting: Pharmacist

## 2024-09-24 DIAGNOSIS — E1122 Type 2 diabetes mellitus with diabetic chronic kidney disease: Secondary | ICD-10-CM

## 2024-09-24 NOTE — Progress Notes (Signed)
" ° °  09/24/2024 Name: [name] MRN: [id number] DOB: [birth year]  Chief Complaint  Patient presents with   Medication Management   Patient was called regarding diabetes management. Unfortunately, she did not answer her phone. HIPAA compliant message was left on her voicemail.    Objective:  Lab Results  Component Value Date   HGBA1C 7.4 (H) 09/17/2024   Jardiance  10 mg filled 08/23/24 for a 30 day supply  Lab Results  Component Value Date   CREATININE 1.15 (H) 09/17/2024   BUN 16 09/17/2024   NA 144 09/17/2024   K 4.3 09/17/2024   CL 103 09/17/2024   CO2 25 09/17/2024    Lab Results  Component Value Date   CHOL 134 09/17/2024   HDL 57 09/17/2024   LDLCALC 53 09/17/2024   TRIG 142 09/17/2024   CHOLHDL 2.4 09/17/2024    Rosuvastatin  10 mg last filled 09/13/24 90 day supply     Assessment/Plan:   Follow up with Patient in 2 weeks. Upcoming appt 01/08/25 with PCP   Cassius DOROTHA Brought, PharmD, BCACP Clinical Pharmacist [phone]      "

## 2024-09-25 ENCOUNTER — Ambulatory Visit (HOSPITAL_BASED_OUTPATIENT_CLINIC_OR_DEPARTMENT_OTHER)

## 2024-09-27 ENCOUNTER — Telehealth: Payer: Self-pay | Admitting: Pharmacist

## 2024-09-27 ENCOUNTER — Other Ambulatory Visit: Payer: Self-pay | Admitting: Nurse Practitioner

## 2024-09-27 DIAGNOSIS — E1122 Type 2 diabetes mellitus with diabetic chronic kidney disease: Secondary | ICD-10-CM

## 2024-09-27 NOTE — Progress Notes (Signed)
 "  09/27/2024 Name: [name] MRN: [id number] DOB: 1941-03-06  Chief Complaint  Patient presents with   Medication Management    Diabetes    [name] is a 84 y.o. year old female who presented for a telephone visit.   They were referred to the pharmacist by their PCP for assistance in managing diabetes.    Subjective: [name] is an 84 year old female who returned my call for follow up on diabetes management. HIPAA identifiers were obtained. She reported feeling well today. She has a medical history significant for:  aortic atherosclerosis, COPD, type 2 diabetes, hyperlipidemia, hypertension, and allergic rhinitis.  Care Team: Primary Care Provider: Georgina Speaks, FNP ; Next Scheduled Visit: 01/08/2025-annual wellness visit in March   Medication Access/Adherence  Current Pharmacy:  [facility] [address] [place] - [address] [place] [address] Phone: [phone] Fax: [phone]  [facility] - [place], [place] - [address] [place] [address] Phone: [phone] Fax: [phone]   Patient reports affordability concerns with their medications: No  Patient reports access/transportation concerns to their pharmacy: No  Patient reports adherence concerns with their medications:  No      Diabetes:  Current medications:   Jardiance  10 mg  Patient denies hypoglycemic s/sx including  dizziness, shakiness, sweating. Patient denies hyperglycemic symptoms including  polyuria, polydipsia, polyphagia, nocturia, neuropathy, blurred vision.   Macrovascular and Microvascular Risk Reduction:  Statin? yes (Rosuvastatin  10 mg last filled 09/13/2024 ); ACEi/ARB? no Last urinary albumin /creatinine ratio:  Lab Results  Component Value Date   MICRALBCREAT 24 01/05/2024   MICRALBCREAT 18 01/04/2023   MICRALBCREAT 30-300 08/24/2021   MICRALBCREAT 30 03/31/2020   MICRALBCREAT  <30 02/15/2019   Last eye exam:  Lab Results  Component Value Date   HMDIABEYEEXA No Retinopathy 02/23/2024   Last foot exam: 05/09/2024 Tobacco Use:  Tobacco Use: Medium Risk (09/17/2024)   Patient History    Smoking Tobacco Use: Former    Smokeless Tobacco Use: Never    Passive Exposure: Not on file     Objective:  Lab Results  Component Value Date   HGBA1C 7.4 (H) 09/17/2024    Lab Results  Component Value Date   CREATININE 1.15 (H) 09/17/2024   BUN 16 09/17/2024   NA 144 09/17/2024   K 4.3 09/17/2024   CL 103 09/17/2024   CO2 25 09/17/2024    Lab Results  Component Value Date   CHOL 134 09/17/2024   HDL 57 09/17/2024   LDLCALC 53 09/17/2024   TRIG 142 09/17/2024   CHOLHDL 2.4 09/17/2024    Medications Reviewed Today     Reviewed by Jolee Cassius JINNY, RPH (Pharmacist) on 09/27/24 at 1315  Med List Status: <None>   Medication Order Taking? Sig Documenting Provider Last Dose Status Informant  albuterol  (PROVENTIL ) (2.5 MG/3ML) 0.083% nebulizer solution [id number] Yes Take 3 mLs (2.5 mg total) by nebulization every 6 (six) hours as needed for wheezing or shortness of breath. Parrett, Madelin RAMAN, NP  Active   albuterol  (VENTOLIN  HFA) 108 (90 Base) MCG/ACT inhaler [id number] Yes Inhale 2 puffs into the lungs every 6 (six) hours as needed for wheezing or shortness of breath. Jude Harden GAILS, MD  Active   aspirin  81 MG tablet [id number] Yes Take 81 mg by mouth every evening.  [provider]  Active Self  Blood Glucose Monitoring Suppl (TRUE METRIX AIR GLUCOSE METER) w/Device KIT [id number] Yes  Use to check blood sugar 4 times a day. Dx code e11.65 Georgina Speaks, FNP  Active   Calcium  Carbonate-Vitamin D  600-400 MG-UNIT tablet [id number] Yes Take 1 tablet by mouth daily. Jarold Medici, MD  Active   cetirizine  (ZYRTEC ) 10 MG tablet [id number] Yes Take 1 tablet (10 mg total) by mouth daily. Georgina Speaks, FNP  Active   cycloSPORINE (RESTASIS) 0.05 % ophthalmic emulsion  [id number] Yes 1 drop 2 (two) times daily. [provider]  Active   donepezil  (ARICEPT ) 10 MG tablet [id number] Yes TAKE 1 TABLET BY MOUTH EVERY DAY IN THE KARNA Georgina, Speaks, FNP  Active   ezetimibe  (ZETIA ) 10 MG tablet [id number] Yes TAKE 1 TABLET BY MOUTH EVERY DAY Georgina Speaks, FNP  Active   fluticasone  (FLONASE ) 50 MCG/ACT nasal spray [id number] Yes Place 1 spray into both nostrils daily. Georgina Speaks, FNP  Active   glucose blood (TRUE METRIX BLOOD GLUCOSE TEST) test strip [id number] Yes USE AS INSTRUCTED TO CHECK BLOOD SUGAR TWICE DAILY Georgina Speaks, FNP  Active   JARDIANCE  10 MG TABS tablet [id number] Yes TAKE 1 TABLET BY MOUTH EVERY DAY Moore, Janece, FNP  Active   lubiprostone  (AMITIZA ) 8 MCG capsule [id number] Yes TAKE 1 CAPSULE (8 MCG TOTAL) BY MOUTH DAILY WITH BREAKFAST. Georgina Speaks, FNP  Active   MAGNESIUM GLYCINATE PO [id number] Yes Take 2 tablets by mouth daily. [provider]  Active   Melatonin 2.5 MG CAPS [id number] Yes Take 2.5 mg by mouth at bedtime as needed (sleep).  [provider]  Active Self  mirtazapine  (REMERON ) 15 MG tablet [id number] Yes TAKE 1 TABLET BY MOUTH EVERYDAY AT BEDTIME Georgina Speaks, FNP  Active   Multiple Vitamin (MULTIVITAMIN) tablet [id number] Yes Take 1 tablet by mouth daily. [provider]  Active Self  niacin  (VITAMIN B3) 500 MG ER tablet [id number] Yes TAKE 1 TABLET BY MOUTH EVERY DAY IN THE KARNA Georgina Speaks, FNP  Active   OVER THE COUNTER MEDICATION [id number] Yes Take 1 tablet by mouth daily. [provider]  Active Self           Med Note SYDELL, LOA DEL   Thu Jun 23, 2017 11:19 AM) Mega red 4 in 1  pantoprazole  (PROTONIX ) 40 MG tablet [id number] Yes Take 1 tablet (40 mg total) by mouth 2 (two) times daily. Georgina Speaks, FNP  Active   potassium chloride  SA (KLOR-CON  M) 20 MEQ tablet [id number] Yes TAKE 1 TABLET BY MOUTH EVERY DAY WITH FOOD Georgina Speaks, FNP  Active   Probiotic Product (PROBIOTIC PO)  [id number] Yes Take by mouth daily at 6 (six) AM. [provider]  Active   rosuvastatin  (CRESTOR ) 10 MG tablet [id number] Yes TAKE 1 TABLET BY MOUTH EVERY DAY Georgina Speaks, FNP  Active   TRELEGY ELLIPTA  100-62.5-25 MCG/ACT AEPB [id number] Yes INHALE 1 PUFF ONE TIME DAILY Jude Harden GAILS, MD  Active   TRUEplus Lancets 33G MISC [id number] Yes TEST BLOOD SUGAR AS DIRECTED Georgina Speaks, FNP  Active   valACYclovir  (VALTREX ) 500 MG tablet [id number]  TAKE 1 TABLET (500 MG TOTAL) BY MOUTH DAILY. Georgina Speaks, FNP  Active   White Petrolatum -Mineral Oil (SYSTANE NIGHTTIME OP) [id number]  Place 1 drop into both eyes at bedtime as needed (for dry eyes).  [provider]  Active Self           Med Note ALONA FREDDIE CHRISTELLA Pablo Sep 25, 2018  4:03 PM)    Med List Note Murray Maywood, CMA 02/22/19  1117): caltrate                  09/17/2024    2:12 PM 06/15/2024   10:50 AM 05/10/2024    2:35 PM  Vitals with BMI  Height 5' 2 5' 2 5' 3  Weight 162 lbs 10 oz 158 lbs 158 lbs  BMI 29.73 28.89 28  Systolic 128 100 865  Diastolic 70 74 66  Pulse 100 99 90     Assessment/Plan:   Diabetes: - Currently controlled; goal A1c <8%. Cardiorenal risk reduction is optimized.. Blood pressure is at goal <130/80. LDL is at goal.  - Continue current therapy   Will follow up after next PCP appointment.   Cassius DOROTHA Brought, PharmD, BCACP Clinical Pharmacist [phone]   "

## 2024-10-02 ENCOUNTER — Ambulatory Visit (HOSPITAL_BASED_OUTPATIENT_CLINIC_OR_DEPARTMENT_OTHER)

## 2024-10-03 ENCOUNTER — Other Ambulatory Visit: Payer: Self-pay

## 2024-10-03 DIAGNOSIS — N183 Chronic kidney disease, stage 3 unspecified: Secondary | ICD-10-CM

## 2024-10-03 MED ORDER — EMPAGLIFLOZIN 10 MG PO TABS: 10.0000 mg | ORAL_TABLET | Freq: Every day | ORAL | 2 refills | Status: AC

## 2024-10-10 ENCOUNTER — Ambulatory Visit (HOSPITAL_BASED_OUTPATIENT_CLINIC_OR_DEPARTMENT_OTHER)

## 2024-10-24 ENCOUNTER — Ambulatory Visit (HOSPITAL_BASED_OUTPATIENT_CLINIC_OR_DEPARTMENT_OTHER)

## 2024-11-14 ENCOUNTER — Ambulatory Visit: Payer: Self-pay

## 2024-11-14 ENCOUNTER — Ambulatory Visit: Payer: Medicare HMO

## 2025-01-08 ENCOUNTER — Encounter: Payer: Self-pay | Admitting: Nurse Practitioner
